# Patient Record
Sex: Female | Born: 1940 | Race: White | Hispanic: No | Marital: Married | Smoking: Never smoker
Health system: Southern US, Community
[De-identification: ages and names within clinical notes are randomized; demographics above are authoritative.]

## PROBLEM LIST (undated history)

## (undated) DIAGNOSIS — K219 Gastro-esophageal reflux disease without esophagitis: Secondary | ICD-10-CM

## (undated) DIAGNOSIS — E119 Type 2 diabetes mellitus without complications: Secondary | ICD-10-CM

## (undated) DIAGNOSIS — I10 Essential (primary) hypertension: Secondary | ICD-10-CM

## (undated) DIAGNOSIS — I519 Heart disease, unspecified: Secondary | ICD-10-CM

## (undated) DIAGNOSIS — I639 Cerebral infarction, unspecified: Secondary | ICD-10-CM

## (undated) DIAGNOSIS — I499 Cardiac arrhythmia, unspecified: Secondary | ICD-10-CM

## (undated) DIAGNOSIS — E7800 Pure hypercholesterolemia, unspecified: Secondary | ICD-10-CM

## (undated) DIAGNOSIS — I480 Paroxysmal atrial fibrillation: Secondary | ICD-10-CM

## (undated) HISTORY — PX: NECK SURGERY: SHX720

## (undated) HISTORY — PX: COLONOSCOPY: SHX174

## (undated) HISTORY — DX: Type 2 diabetes mellitus without complications: E11.9

## (undated) HISTORY — DX: Cerebral infarction, unspecified: I63.9

## (undated) HISTORY — DX: Essential (primary) hypertension: I10

## (undated) HISTORY — DX: Pure hypercholesterolemia, unspecified: E78.00

---

## 2000-06-19 ENCOUNTER — Ambulatory Visit (HOSPITAL_COMMUNITY): Admission: RE | Admit: 2000-06-19 | Discharge: 2000-06-20 | Payer: Self-pay | Admitting: Neurosurgery

## 2000-07-11 ENCOUNTER — Encounter: Admission: RE | Admit: 2000-07-11 | Discharge: 2000-07-11 | Payer: Self-pay | Admitting: Neurosurgery

## 2000-08-01 ENCOUNTER — Ambulatory Visit (HOSPITAL_COMMUNITY): Admission: RE | Admit: 2000-08-01 | Discharge: 2000-08-01 | Payer: Self-pay | Admitting: Cardiology

## 2001-01-12 ENCOUNTER — Encounter: Admission: RE | Admit: 2001-01-12 | Discharge: 2001-01-12 | Payer: Self-pay | Admitting: Neurosurgery

## 2002-01-01 ENCOUNTER — Other Ambulatory Visit: Admission: RE | Admit: 2002-01-01 | Discharge: 2002-01-01 | Payer: Self-pay | Admitting: Family Medicine

## 2002-01-02 ENCOUNTER — Ambulatory Visit (HOSPITAL_COMMUNITY): Admission: RE | Admit: 2002-01-02 | Discharge: 2002-01-02 | Payer: Self-pay | Admitting: Family Medicine

## 2002-01-02 ENCOUNTER — Encounter: Payer: Self-pay | Admitting: Family Medicine

## 2002-01-14 ENCOUNTER — Encounter: Payer: Self-pay | Admitting: Family Medicine

## 2002-01-14 ENCOUNTER — Ambulatory Visit (HOSPITAL_COMMUNITY): Admission: RE | Admit: 2002-01-14 | Discharge: 2002-01-14 | Payer: Self-pay | Admitting: Family Medicine

## 2002-02-01 ENCOUNTER — Ambulatory Visit (HOSPITAL_COMMUNITY): Admission: RE | Admit: 2002-02-01 | Discharge: 2002-02-01 | Payer: Self-pay | Admitting: Internal Medicine

## 2002-02-04 ENCOUNTER — Ambulatory Visit (HOSPITAL_COMMUNITY): Admission: RE | Admit: 2002-02-04 | Discharge: 2002-02-04 | Payer: Self-pay | Admitting: Family Medicine

## 2002-02-04 ENCOUNTER — Encounter: Payer: Self-pay | Admitting: Family Medicine

## 2003-09-21 ENCOUNTER — Inpatient Hospital Stay (HOSPITAL_COMMUNITY): Admission: EM | Admit: 2003-09-21 | Discharge: 2003-09-24 | Payer: Self-pay | Admitting: Emergency Medicine

## 2003-09-21 ENCOUNTER — Encounter: Payer: Self-pay | Admitting: Emergency Medicine

## 2004-01-15 ENCOUNTER — Ambulatory Visit (HOSPITAL_COMMUNITY): Admission: RE | Admit: 2004-01-15 | Discharge: 2004-01-15 | Payer: Self-pay | Admitting: Family Medicine

## 2004-02-25 ENCOUNTER — Ambulatory Visit (HOSPITAL_COMMUNITY): Admission: RE | Admit: 2004-02-25 | Discharge: 2004-02-25 | Payer: Self-pay | Admitting: Family Medicine

## 2004-03-02 ENCOUNTER — Ambulatory Visit (HOSPITAL_COMMUNITY): Admission: RE | Admit: 2004-03-02 | Discharge: 2004-03-02 | Payer: Self-pay | Admitting: Family Medicine

## 2008-07-16 ENCOUNTER — Ambulatory Visit (HOSPITAL_COMMUNITY): Admission: RE | Admit: 2008-07-16 | Discharge: 2008-07-16 | Payer: Self-pay | Admitting: Family Medicine

## 2008-08-18 ENCOUNTER — Ambulatory Visit (HOSPITAL_COMMUNITY): Admission: RE | Admit: 2008-08-18 | Discharge: 2008-08-18 | Payer: Self-pay | Admitting: Family Medicine

## 2008-09-23 ENCOUNTER — Ambulatory Visit (HOSPITAL_COMMUNITY): Admission: RE | Admit: 2008-09-23 | Discharge: 2008-09-23 | Payer: Self-pay | Admitting: Family Medicine

## 2009-12-10 ENCOUNTER — Inpatient Hospital Stay (HOSPITAL_COMMUNITY): Admission: EM | Admit: 2009-12-10 | Discharge: 2009-12-22 | Payer: Self-pay | Admitting: Emergency Medicine

## 2010-12-29 ENCOUNTER — Emergency Department (HOSPITAL_COMMUNITY): Admit: 2010-12-29 | Discharge: 2010-12-29 | Disposition: A | Discharge status: Home / Self Care | Payer: Medicare Other

## 2010-12-29 ENCOUNTER — Observation Stay (HOSPITAL_COMMUNITY)
Admission: EM | Admit: 2010-12-29 | Discharge: 2010-12-31 | Disposition: A | Discharge status: Home / Self Care | Payer: Medicare Other | Attending: Family Medicine | Admitting: Family Medicine

## 2010-12-29 ENCOUNTER — Encounter (HOSPITAL_COMMUNITY): Payer: Self-pay

## 2010-12-29 DIAGNOSIS — F411 Generalized anxiety disorder: Secondary | ICD-10-CM | POA: Insufficient documentation

## 2010-12-29 DIAGNOSIS — Z79899 Other long term (current) drug therapy: Secondary | ICD-10-CM | POA: Insufficient documentation

## 2010-12-29 DIAGNOSIS — I10 Essential (primary) hypertension: Secondary | ICD-10-CM | POA: Insufficient documentation

## 2010-12-29 DIAGNOSIS — R0789 Other chest pain: Secondary | ICD-10-CM | POA: Insufficient documentation

## 2010-12-29 DIAGNOSIS — IMO0001 Reserved for inherently not codable concepts without codable children: Secondary | ICD-10-CM | POA: Insufficient documentation

## 2010-12-29 DIAGNOSIS — I209 Angina pectoris, unspecified: Principal | ICD-10-CM | POA: Insufficient documentation

## 2010-12-29 DIAGNOSIS — I2510 Atherosclerotic heart disease of native coronary artery without angina pectoris: Secondary | ICD-10-CM | POA: Insufficient documentation

## 2010-12-29 LAB — COMPREHENSIVE METABOLIC PANEL
AST: 30 U/L (ref 0–37)
Albumin: 4 g/dL (ref 3.5–5.2)
Calcium: 9.7 mg/dL (ref 8.4–10.5)
Creatinine, Ser: 0.85 mg/dL (ref 0.4–1.2)
GFR calc Af Amer: >60 mL/min (ref >60)

## 2010-12-29 LAB — GLUCOSE, CAPILLARY: Glucose-Capillary: 164 mg/dL — ABNORMAL HIGH (ref 70–99)

## 2010-12-29 LAB — CBC
Hemoglobin: 13.9 g/dL (ref 12.0–15.0)
MCV: 85.7 fL (ref 78.0–100.0)
Platelets: 158 10*3/uL (ref 150–400)
RBC: 4.67 MIL/uL (ref 3.87–5.11)
WBC: 6.9 10*3/uL (ref 4.0–10.5)

## 2010-12-29 LAB — POCT CARDIAC MARKERS
CKMB, poc: <1 ng/mL — ABNORMAL LOW (ref 1.0–8.0)
Troponin i, poc: <0.05 ng/mL (ref 0.00–0.09)

## 2010-12-29 LAB — PROTIME-INR: Prothrombin Time: 12.6 seconds (ref 11.6–15.2)

## 2010-12-29 LAB — CK TOTAL AND CKMB (NOT AT ARMC): CK, MB: 1.2 ng/mL (ref 0.3–4.0)

## 2010-12-29 LAB — APTT: aPTT: 27 seconds (ref 24–37)

## 2010-12-29 LAB — TROPONIN I: Troponin I: 0.02 ng/mL (ref 0.00–0.06)

## 2010-12-30 LAB — GLUCOSE, CAPILLARY: Glucose-Capillary: 155 mg/dL — ABNORMAL HIGH (ref 70–99)

## 2010-12-30 LAB — CARDIAC PANEL(CRET KIN+CKTOT+MB+TROPI)
Relative Index: INVALID (ref 0.0–2.5)
Total CK: 41 U/L (ref 7–177)

## 2010-12-31 LAB — GLUCOSE, CAPILLARY: Glucose-Capillary: 194 mg/dL — ABNORMAL HIGH (ref 70–99)

## 2011-01-01 NOTE — H&P (Signed)
NAMEBRIYAH, Holly Sparks             ACCOUNT NO.:  [id number]  MEDICAL RECORD NO.:  [id number]          PATIENT TYPE:  OBV  LOCATION:  A312                          FACILITY:  [facility]  PHYSICIAN:  Angeletta Goelz L. Juanetta Gosling, M.D.DATE OF BIRTH:  [birth year]  DATE OF ADMISSION:  12/29/2010 DATE OF DISCHARGE:  LH                             HISTORY & PHYSICAL   Patient of Dr. [name].  REASON FOR ADMISSION:  Chest pain.  HISTORY:  Holly Sparks is a 70 year old who came to the emergency room because of chest discomfort.  She said that it started about 2 hours prior to coming to the emergency room.  It felt like what she describes as a toothache in her chest.  It was in the midsternal area.  She has had some nausea, but no diaphoresis and the pain did not radiate.  She was getting dressed, getting ready for church when it started.  She said that she has been dizzy during the day.  Her pain was as high as a 5 or so out of 10.  It has gone now.  She took aspirin and when she came to the emergency room, she was given nitroglycerin.  It is not totally clear if the nitroglycerin helped her or not.  Her past medical history is positive for coronary artery disease with cardiac catheterization done in 2011 and that showed she had a 50-60% narrowing in the LAD in the diagonal, circumflex had a 20% proximal narrowing before the first marginal branch.  There was a 30% narrowing in the right coronary.  She had normal left ventricular function perhaps even hyperdynamic and she was had an episode of atrial fibrillation and was noted to have some episodes of bradycardia during her hospitalization in January 2011.  In addition to that, she has diabetes mellitus, hypertension and hyperlipidemia.  Her family history is positive for coronary artery disease in her father.  Her medications at home are: 1. Aspirin 81 mg daily. 2. Lipitor 10 mg daily. 3. Metoprolol 12.5 mg b.i.d. 4. Enalapril 10 mg daily. 5.  Vitamin D 1000 units daily. 6. She takes hydrochlorothiazide as needed for fluid.  Her social history, she does not use any tobacco, alcohol or illicit drugs and lives at home with family.  Her review of systems except as mentioned is essentially negative.  Her exam now blood pressure initially 194/99, then 176/89, pulse 70, then 60, temperature is 98.2.  Her respirations about 18.  Her pupils are reactive.  Nose and throat are clear.  Her neck is supple without masses, bruits or JVD.  Her chest is clear without wheezes, rales or rhonchi.  Her heart is regular.  She does not have a murmur, gallop or rub.  Her abdomen is soft without masses.  Bowel sounds are present and active.  Extremity showed no edema.  Central nervous system examination is grossly intact.  Electrocardiogram shows nonspecific ST-T wave changes and there was T- wave inversion in chest lead I that now has resolved and she has cardiac panel in the emergency room that was negative.  Her electrolytes were normal.  Glucose of 215.  Liver panel  is normal.  PT/INR normal.  White blood count 6900, hemoglobin is 13.9, platelets 158.  ASSESSMENT:  She has chest discomfort.  She does have fairly minor coronary artery disease based on cardiac catheterization.  She has a history of atrial fibrillation, but she did not have any arrhythmias that she noticed at least prior to coming to the hospital and she has multiple cardiac risk factors.  She is going to be brought in for observation, have a monitor, clear liquid diet and she will have cardiac panel and EKG q.8 h x2 more and she will have consultation with her cardiologist in the morning.     Holly Sparks L. Juanetta Gosling, M.D.     ELH/MEDQ  D:  12/29/2010  T:  12/30/2010  Job:  [id number]  Electronically Signed by Kari Baars M.D. on 12/31/2010 08:21:50 AM

## 2011-01-11 NOTE — Discharge Summary (Signed)
  Holly Sparks, Holly Sparks             ACCOUNT NO.:  [id number]  MEDICAL RECORD NO.:  [id number]           PATIENT TYPE:  I  LOCATION:  A312                          FACILITY:  [facility]  PHYSICIAN:  Mila Homer. [name], M.D.DATE OF BIRTH:  [birth year]  DATE OF ADMISSION:  12/29/2010 DATE OF DISCHARGE:  02/03/2012LH                              DISCHARGE SUMMARY   This 70 year old woman was admitted to the hospital with angina pectoris secondary to a hypertensive crisis.  She had a benign 3-day hospitalization extending from December 29, 2010, to December 31, 2010. Vital signs remained stable, although blood pressures were running high the first several days.  Chest x-ray showed stable cardiomegaly.  Blood tests included a CBC and a CMP, which were normal except for the glucose of 215.  Her INR of 0.92.  Cardiac markers x2 were normal, and cardiac panel x2 was normal.  Troponin was 0.02.  She was admitted to the hospital by Dr. Shaune Pollack, on-call for me.  She was started on normal saline IV at 100 mL an hour, given clear liquids, aspirin 325 mg daily, Tylenol p.r.n., Lipitor 80 mg daily, metoprolol 12.5 mg q.12 hours, enalapril 10 mg daily, and she had Accu-Cheks before meals and at bedtime with sliding scale insulin.  EKG stayed stable.  She did require clonidine 0.1 mg q.4 hours, given her hypertension and this was given p.r.n.  She was put on lorazepam 0.5 q.i.d. p.r.n. anxiety.  She was seen by Dr. Ritta Slot of [facility], who felt that her angina pectoris had been kicked off by hypertension.  By her third day she was improved.  Blood pressures had dropped somewhat on enalapril 20 mg b.i.d. and hydrochlorothiazide had been started at 25 daily.  She was ready for discharge home and was much improved at that time.  FINAL DISCHARGE DIAGNOSES: 1. 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 2. Hypertensive crisis. 3. Type 2 diabetes, uncontrolled. 4.  Coronary artery disease. 5. Anxiety.  She was discharged home on: 1. Enalapril 20 mg b.i.d. (#60, RF x11). 2. Hydrochlorothiazide 25 mg daily. 3. Metoprolol 50 mg quarter tablet b.i.d. 4. She is also to take lorazepam 0.5 mg q.i.d. for anxiety. 5. She is to continue aspirin 81 mg daily. 6. Lipitor 80 mg, which she usually uses has an eighth of a tablet     daily. 7. Metformin 500 mg 1 in the morning, 2 at night. 8. Vicodin 5/500 for severe pain.  FOLLOWUP:  Will be in my office within 5 days.  She is to continue to check her blood pressure on her home digital cuff b.i.d. and to call if the blood pressures are significantly elevated.  We discussed this at length.     Mila Homer. [name], M.D.     SDK/MEDQ  D:  12/31/2010  T:  12/31/2010  Job:  [id number]  Electronically Signed by John Giovanni M.D. on 01/06/2011 03:38:32 AM

## 2011-01-11 NOTE — Progress Notes (Addendum)
  Holly Sparks, DEGROOTE             ACCOUNT NO.:  [id number]  MEDICAL RECORD NO.:  [id number]           PATIENT TYPE:  LOCATION:  RAD                           FACILITY:  [facility]  PHYSICIAN:  Mila Homer. [name], M.D.DATE OF BIRTH:  January 17, 1941  DATE OF PROCEDURE:  12/30/2010 DATE OF DISCHARGE:                                PROGRESS NOTE   SUBJECTIVE:  She feels somewhat better today.  Yesterday she had a lot of stressors and then developed a dull, deep chest ache.  Her blood pressure was also up.  She came to the emergency room.  She does have a history of coronary artery disease with cardiac catheterization to [facility] done 1 year ago.  She has a 50-60% lesion and other lesions as well.  Today she is asymptomatic.  I talked to her nurse, who notes her blood pressure has been up, however.  She is on metoprolol and enalapril for that.  OBJECTIVE:  VITAL SIGNS:  Temperature 97.8, pulse 49, respiratory rate 18, blood pressure 187/80. GENERAL:  She is sitting up in bed.  Her friend is visiting.  She is in no acute distress.  Well-developed and somewhat obese.  She is oriented and alert. HEART:  Has a regular rhythm, rate of about 70. LUNGS:  Are clear throughout. EXTREMITIES:  She has no edema of the ankles.  Cardiac enzymes x2 done overnight were negative.  ASSESSMENT: 1. Angina pectoris. 2. Coronary artery disease. 3. Hypertension. 4. Anxiety. 5. Type 2 diabetes.  PLAN:  DC IV and switch to Hep-Lock.  Lorazepam 0.5 mg q.i.d. p.r.n. anxiety.  Add clonidine 0.1 mg q.4 h for systolic BP greater than 160 or diastolics greater than 110.  Talked to her at length.  I think the social stressors she had recently probably resulted in her blood pressure elevation which then put too much stress on her heart and caused the angina.  She will be seen by [facility] today for further evaluation.     Mila Homer. [name], M.D.     SDK/MEDQ   D:  12/30/2010  T:  12/30/2010  Job:  [id number]  Electronically Signed by John Giovanni M.D. on 01/06/2011 03:39:02 AM

## 2011-01-12 NOTE — Consult Note (Signed)
Holly Sparks, IVIE NO.:  [id number]  MEDICAL RECORD NO.:  [id number]           PATIENT TYPE:  LOCATION:                                 FACILITY:  PHYSICIAN:  Ritta Slot, MD     DATE OF BIRTH:  [birth year]  DATE OF CONSULTATION:  12/30/2010 DATE OF DISCHARGE:                                CONSULTATION   REFERRING PHYSICIAN:  Mila Homer. [name], M.D.  REASON FOR CONSULTATION:  Chest pain.  [name] is a very pleasant 71 year old Caucasian female who came to emergency room with atypical chest pain.  She had a pain in her chest that started about 6 o'clock in the evening, it felt like a a heaviness in her chest.  It lasted 2 hours until she came to the emergency room when her chest pain was beginning of dissipate.  She was given nitroglycerin and her chest pain resolved.  On admission to the emergency room, she was found to have a very high blood pressure of 194/99.  ECG showed nonspecific ST-T wave changes, T-wave inversion and chest pain resolved.  Her cardiac enzyme panel was entirely normal.  She remains  pain free and has been better.  Her chest pain has resolved; however, her blood pressure remains elevated today at 205/94.  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  PAST MEDICAL HISTORY: 1. Positive for paroxysmal atrial fibrillation in the past, cardiac     catheterization done in 2011 showed 50% to 60% narrowing in the LAD     and diagonal,mild disease in the circumflex first marginal, 30% narrowing     in the right coronary artery, normal LV function. 2. Diabetes. 3. Hypertension. 4. Dyslipidemia.  CURRENT MEDICATIONS: 1. On admission to the hospital, aspirin 81 mg daily. 2. Lipitor 10 mg daily. 3. Enalapril 10 mg daily. 4. Norvasc 5 mg b.i.d. 5. Vitamin D 1000 units daily.  SOCIAL HISTORY:  Does not use any tobacco or  alcohol or illicit drug use.  ALLERGIES:  No known drug allergies.  REVIEW OF SYSTEMS:  Unremarkable.  PHYSICAL EXAMINATION:  GENERAL:  Well-looking individual, in no acute distress. VITAL SIGNS:  Temperature is 97.9, pulse was 60 SR, respirations 18, blood pressure 200/94, O2 sat 94% on room air. HEENT:  Head is atraumatic, normocephalic. NECK:  Supple.  Full range with motion.  No jugular venous distention, carotid bruits, or thyromegaly. NEUROLOGIC:  Cranial nerves II-XII normal.  PERLA no focal deficits. MUSCULOSKELETAL:  Normal, no focal deficits. CARDIOVASCULAR:   Heart sounds 1 and 2 heard.  No murmurs, rubs, or gallops. LUNGS:  Clear to auscultation bilaterally.  Percussion resonant throughout.  No creps or wheezing noted. EXTREMITIES:  Pedal pulses 2+.  No edema. ABDOMEN:  Soft, nontender.  No hepatosplenomegaly.  Bowel sounds present.  AAA noted.  No renal bruits heard.  PLAN:  I think we will go back up on her enalapril 20 mg p.o. b.i.d. and add in hydrochlorothiazide 25 mg daily.  She will need  to get her blood pressure down to less than 130/70.  We will follow along with you.  I think she can be discharged when her blood pressure is better.  My assessment for this lady is that she has chest pain secondary to hypertensive crisis.  Nonetheless, we will go ahead and order an outpatient nuclear perfusion stress test in our office after her discharge.  We will arrange this.  We will follow with you.  Many thanks for consultation.     Ritta Slot, MD     HS/MEDQ  D:  12/30/2010  T:  12/31/2010  Job:  [id number]  Electronically Signed by Ritta Slot MD on 01/12/2011 01:47:18 PM

## 2011-02-12 LAB — COMPREHENSIVE METABOLIC PANEL
ALT: 32 U/L (ref 0–35)
AST: 40 U/L — ABNORMAL HIGH (ref 0–37)
Albumin: 3.5 g/dL (ref 3.5–5.2)
Alkaline Phosphatase: 58 U/L (ref 39–117)
Calcium: 9.6 mg/dL (ref 8.4–10.5)
GFR calc Af Amer: >60 mL/min (ref >60)
Glucose, Bld: 182 mg/dL — ABNORMAL HIGH (ref 70–99)
Potassium: 4 mEq/L (ref 3.5–5.1)
Sodium: 138 mEq/L (ref 135–145)
Total Protein: 6.5 g/dL (ref 6.0–8.3)

## 2011-02-12 LAB — GLUCOSE, CAPILLARY
Glucose-Capillary: 150 mg/dL — ABNORMAL HIGH (ref 70–99)
Glucose-Capillary: 159 mg/dL — ABNORMAL HIGH (ref 70–99)
Glucose-Capillary: 201 mg/dL — ABNORMAL HIGH (ref 70–99)
Glucose-Capillary: 209 mg/dL — ABNORMAL HIGH (ref 70–99)
Glucose-Capillary: 223 mg/dL — ABNORMAL HIGH (ref 70–99)
Glucose-Capillary: 258 mg/dL — ABNORMAL HIGH (ref 70–99)
Glucose-Capillary: 269 mg/dL — ABNORMAL HIGH (ref 70–99)
Glucose-Capillary: 305 mg/dL — ABNORMAL HIGH (ref 70–99)

## 2011-02-12 LAB — LIPID PANEL
Cholesterol: 145 mg/dL (ref 0–200)
HDL: 43 mg/dL (ref >39)
Total CHOL/HDL Ratio: 3.4 RATIO
Triglycerides: 126 mg/dL (ref <150)
VLDL: 25 mg/dL (ref 0–40)
VLDL: 27 mg/dL (ref 0–40)

## 2011-02-12 LAB — HEPARIN LEVEL (UNFRACTIONATED)
Heparin Unfractionated: 0.58 IU/mL (ref 0.30–0.70)
Heparin Unfractionated: 1.21 IU/mL — ABNORMAL HIGH (ref 0.30–0.70)

## 2011-02-12 LAB — BASIC METABOLIC PANEL
BUN: 24 mg/dL — ABNORMAL HIGH (ref 6–23)
CO2: 23 mEq/L (ref 19–32)
Calcium: 9.3 mg/dL (ref 8.4–10.5)
Chloride: 102 mEq/L (ref 96–112)
Creatinine, Ser: 0.79 mg/dL (ref 0.4–1.2)
Creatinine, Ser: 0.87 mg/dL (ref 0.4–1.2)
GFR calc Af Amer: >60 mL/min (ref >60)
GFR calc non Af Amer: >60 mL/min (ref >60)
Glucose, Bld: 182 mg/dL — ABNORMAL HIGH (ref 70–99)
Sodium: 136 mEq/L (ref 135–145)

## 2011-02-12 LAB — CBC
HCT: 39.6 % (ref 36.0–46.0)
Hemoglobin: 12.9 g/dL (ref 12.0–15.0)
Hemoglobin: 14.9 g/dL (ref 12.0–15.0)
MCHC: 33.9 g/dL (ref 30.0–36.0)
MCV: 88.7 fL (ref 78.0–100.0)
Platelets: 129 10*3/uL — ABNORMAL LOW (ref 150–400)
Platelets: 156 10*3/uL (ref 150–400)
RBC: 4.91 MIL/uL (ref 3.87–5.11)
RDW: 14.7 % (ref 11.5–15.5)
RDW: 14.8 % (ref 11.5–15.5)
RDW: 14.9 % (ref 11.5–15.5)
WBC: 8.4 10*3/uL (ref 4.0–10.5)

## 2011-02-12 LAB — CK TOTAL AND CKMB (NOT AT ARMC)
CK, MB: 4.5 ng/mL — ABNORMAL HIGH (ref 0.3–4.0)
Relative Index: INVALID (ref 0.0–2.5)
Total CK: 42 U/L (ref 7–177)
Total CK: 47 U/L (ref 7–177)

## 2011-02-12 LAB — APTT: aPTT: >200 seconds (ref 24–37)

## 2011-02-12 LAB — POCT I-STAT, CHEM 8
BUN: 45 mg/dL — ABNORMAL HIGH (ref 6–23)
Calcium, Ion: 1.05 mmol/L — ABNORMAL LOW (ref 1.12–1.32)
Chloride: 105 mEq/L (ref 96–112)
HCT: 51 % — ABNORMAL HIGH (ref 36.0–46.0)
Potassium: 5.7 mEq/L — ABNORMAL HIGH (ref 3.5–5.1)

## 2011-02-12 LAB — URINALYSIS, MICROSCOPIC ONLY
Glucose, UA: NEGATIVE mg/dL
Ketones, ur: NEGATIVE mg/dL
Nitrite: NEGATIVE
Specific Gravity, Urine: 1.021 (ref 1.005–1.030)
pH: 5 (ref 5.0–8.0)

## 2011-02-12 LAB — POCT CARDIAC MARKERS
CKMB, poc: 1.2 ng/mL (ref 1.0–8.0)
Troponin i, poc: <0.05 ng/mL (ref 0.00–0.09)

## 2011-02-12 LAB — PROTIME-INR
INR: 1.09 (ref 0.00–1.49)
INR: 1.09 (ref 0.00–1.49)
INR: 1.12 (ref 0.00–1.49)
Prothrombin Time: 14 seconds (ref 11.6–15.2)

## 2011-02-12 LAB — DIFFERENTIAL
Basophils Absolute: 0 10*3/uL (ref 0.0–0.1)
Basophils Relative: 0 % (ref 0–1)
Lymphocytes Relative: 18 % (ref 12–46)
Monocytes Absolute: 0.3 10*3/uL (ref 0.1–1.0)
Monocytes Relative: 4 % (ref 3–12)
Neutro Abs: 6.1 10*3/uL (ref 1.7–7.7)
Neutrophils Relative %: 77 % (ref 43–77)

## 2011-02-12 LAB — HEMOGLOBIN A1C: Hgb A1c MFr Bld: 7.6 % — ABNORMAL HIGH (ref 4.6–6.1)

## 2011-02-13 LAB — DIFFERENTIAL
Basophils Absolute: 0 10*3/uL (ref 0.0–0.1)
Basophils Absolute: 0 10*3/uL (ref 0.0–0.1)
Basophils Relative: 0 % (ref 0–1)
Basophils Relative: 1 % (ref 0–1)
Eosinophils Absolute: 0.1 10*3/uL (ref 0.0–0.7)
Eosinophils Relative: 1 % (ref 0–5)
Lymphocytes Relative: 36 % (ref 12–46)
Monocytes Absolute: 0.4 10*3/uL (ref 0.1–1.0)
Neutro Abs: 3.7 10*3/uL (ref 1.7–7.7)
Neutrophils Relative %: 55 % (ref 43–77)

## 2011-02-13 LAB — GLUCOSE, CAPILLARY
Glucose-Capillary: 122 mg/dL — ABNORMAL HIGH (ref 70–99)
Glucose-Capillary: 123 mg/dL — ABNORMAL HIGH (ref 70–99)
Glucose-Capillary: 139 mg/dL — ABNORMAL HIGH (ref 70–99)
Glucose-Capillary: 143 mg/dL — ABNORMAL HIGH (ref 70–99)
Glucose-Capillary: 144 mg/dL — ABNORMAL HIGH (ref 70–99)
Glucose-Capillary: 147 mg/dL — ABNORMAL HIGH (ref 70–99)
Glucose-Capillary: 158 mg/dL — ABNORMAL HIGH (ref 70–99)
Glucose-Capillary: 164 mg/dL — ABNORMAL HIGH (ref 70–99)
Glucose-Capillary: 176 mg/dL — ABNORMAL HIGH (ref 70–99)
Glucose-Capillary: 176 mg/dL — ABNORMAL HIGH (ref 70–99)
Glucose-Capillary: 178 mg/dL — ABNORMAL HIGH (ref 70–99)
Glucose-Capillary: 185 mg/dL — ABNORMAL HIGH (ref 70–99)
Glucose-Capillary: 186 mg/dL — ABNORMAL HIGH (ref 70–99)
Glucose-Capillary: 192 mg/dL — ABNORMAL HIGH (ref 70–99)
Glucose-Capillary: 216 mg/dL — ABNORMAL HIGH (ref 70–99)
Glucose-Capillary: 226 mg/dL — ABNORMAL HIGH (ref 70–99)
Glucose-Capillary: 250 mg/dL — ABNORMAL HIGH (ref 70–99)

## 2011-02-13 LAB — HEPARIN LEVEL (UNFRACTIONATED)
Heparin Unfractionated: 0.5 IU/mL (ref 0.30–0.70)
Heparin Unfractionated: 0.57 IU/mL (ref 0.30–0.70)
Heparin Unfractionated: 0.66 IU/mL (ref 0.30–0.70)
Heparin Unfractionated: 0.74 IU/mL — ABNORMAL HIGH (ref 0.30–0.70)
Heparin Unfractionated: <0.1 IU/mL — ABNORMAL LOW (ref 0.30–0.70)

## 2011-02-13 LAB — PROTIME-INR
INR: 1.06 (ref 0.00–1.49)
INR: 1.1 (ref 0.00–1.49)
INR: 1.16 (ref 0.00–1.49)
INR: 1.63 — ABNORMAL HIGH (ref 0.00–1.49)
INR: 2.23 — ABNORMAL HIGH (ref 0.00–1.49)
Prothrombin Time: 13.5 seconds (ref 11.6–15.2)
Prothrombin Time: 13.7 seconds (ref 11.6–15.2)
Prothrombin Time: 14.1 seconds (ref 11.6–15.2)
Prothrombin Time: 14.2 seconds (ref 11.6–15.2)
Prothrombin Time: 16.9 seconds — ABNORMAL HIGH (ref 11.6–15.2)
Prothrombin Time: 17.7 seconds — ABNORMAL HIGH (ref 11.6–15.2)
Prothrombin Time: 19.2 seconds — ABNORMAL HIGH (ref 11.6–15.2)
Prothrombin Time: 24.5 seconds — ABNORMAL HIGH (ref 11.6–15.2)

## 2011-02-13 LAB — BASIC METABOLIC PANEL
BUN: 12 mg/dL (ref 6–23)
BUN: 12 mg/dL (ref 6–23)
BUN: 12 mg/dL (ref 6–23)
BUN: 14 mg/dL (ref 6–23)
BUN: 15 mg/dL (ref 6–23)
CO2: 22 mEq/L (ref 19–32)
CO2: 25 mEq/L (ref 19–32)
CO2: 25 mEq/L (ref 19–32)
CO2: 26 mEq/L (ref 19–32)
CO2: 26 mEq/L (ref 19–32)
CO2: 26 mEq/L (ref 19–32)
CO2: 28 mEq/L (ref 19–32)
Calcium: 8.8 mg/dL (ref 8.4–10.5)
Calcium: 8.8 mg/dL (ref 8.4–10.5)
Calcium: 9 mg/dL (ref 8.4–10.5)
Calcium: 9 mg/dL (ref 8.4–10.5)
Calcium: 9.1 mg/dL (ref 8.4–10.5)
Calcium: 9.4 mg/dL (ref 8.4–10.5)
Calcium: 9.5 mg/dL (ref 8.4–10.5)
Chloride: 105 mEq/L (ref 96–112)
Chloride: 107 mEq/L (ref 96–112)
Chloride: 107 mEq/L (ref 96–112)
Chloride: 108 mEq/L (ref 96–112)
Creatinine, Ser: 0.77 mg/dL (ref 0.4–1.2)
Creatinine, Ser: 0.8 mg/dL (ref 0.4–1.2)
Creatinine, Ser: 0.82 mg/dL (ref 0.4–1.2)
Creatinine, Ser: 0.84 mg/dL (ref 0.4–1.2)
Creatinine, Ser: 0.96 mg/dL (ref 0.4–1.2)
Creatinine, Ser: 0.98 mg/dL (ref 0.4–1.2)
Creatinine, Ser: 1 mg/dL (ref 0.4–1.2)
Creatinine, Ser: 1.07 mg/dL (ref 0.4–1.2)
GFR calc Af Amer: >60 mL/min (ref >60)
GFR calc Af Amer: >60 mL/min (ref >60)
GFR calc Af Amer: >60 mL/min (ref >60)
GFR calc Af Amer: >60 mL/min (ref >60)
GFR calc Af Amer: >60 mL/min (ref >60)
GFR calc Af Amer: >60 mL/min (ref >60)
GFR calc non Af Amer: 55 mL/min — ABNORMAL LOW (ref >60)
GFR calc non Af Amer: 57 mL/min — ABNORMAL LOW (ref >60)
GFR calc non Af Amer: >60 mL/min (ref >60)
GFR calc non Af Amer: >60 mL/min (ref >60)
GFR calc non Af Amer: >60 mL/min (ref >60)
Glucose, Bld: 116 mg/dL — ABNORMAL HIGH (ref 70–99)
Glucose, Bld: 118 mg/dL — ABNORMAL HIGH (ref 70–99)
Glucose, Bld: 145 mg/dL — ABNORMAL HIGH (ref 70–99)
Glucose, Bld: 151 mg/dL — ABNORMAL HIGH (ref 70–99)
Glucose, Bld: 167 mg/dL — ABNORMAL HIGH (ref 70–99)
Glucose, Bld: 180 mg/dL — ABNORMAL HIGH (ref 70–99)
Glucose, Bld: 182 mg/dL — ABNORMAL HIGH (ref 70–99)
Potassium: 3.6 mEq/L (ref 3.5–5.1)
Potassium: 3.7 mEq/L (ref 3.5–5.1)
Potassium: 3.7 mEq/L (ref 3.5–5.1)
Potassium: 4.2 mEq/L (ref 3.5–5.1)
Sodium: 133 mEq/L — ABNORMAL LOW (ref 135–145)
Sodium: 135 mEq/L (ref 135–145)
Sodium: 137 mEq/L (ref 135–145)
Sodium: 138 mEq/L (ref 135–145)

## 2011-02-13 LAB — CBC
HCT: 35.7 % — ABNORMAL LOW (ref 36.0–46.0)
HCT: 35.7 % — ABNORMAL LOW (ref 36.0–46.0)
HCT: 36.5 % (ref 36.0–46.0)
HCT: 37 % (ref 36.0–46.0)
HCT: 37.4 % (ref 36.0–46.0)
Hemoglobin: 12.2 g/dL (ref 12.0–15.0)
Hemoglobin: 13.3 g/dL (ref 12.0–15.0)
MCHC: 33 g/dL (ref 30.0–36.0)
MCHC: 33.5 g/dL (ref 30.0–36.0)
MCHC: 33.9 g/dL (ref 30.0–36.0)
MCHC: 34.4 g/dL (ref 30.0–36.0)
MCHC: 34.5 g/dL (ref 30.0–36.0)
MCHC: 34.6 g/dL (ref 30.0–36.0)
MCV: 89.2 fL (ref 78.0–100.0)
MCV: 89.4 fL (ref 78.0–100.0)
MCV: 90.1 fL (ref 78.0–100.0)
MCV: 90.3 fL (ref 78.0–100.0)
Platelets: 117 10*3/uL — ABNORMAL LOW (ref 150–400)
Platelets: 126 10*3/uL — ABNORMAL LOW (ref 150–400)
Platelets: 126 10*3/uL — ABNORMAL LOW (ref 150–400)
Platelets: 130 10*3/uL — ABNORMAL LOW (ref 150–400)
Platelets: 130 10*3/uL — ABNORMAL LOW (ref 150–400)
RBC: 3.89 MIL/uL (ref 3.87–5.11)
RBC: 3.96 MIL/uL (ref 3.87–5.11)
RBC: 3.98 MIL/uL (ref 3.87–5.11)
RBC: 4.03 MIL/uL (ref 3.87–5.11)
RBC: 4.37 MIL/uL (ref 3.87–5.11)
RDW: 14.4 % (ref 11.5–15.5)
RDW: 14.6 % (ref 11.5–15.5)
RDW: 14.6 % (ref 11.5–15.5)
RDW: 14.7 % (ref 11.5–15.5)
RDW: 14.8 % (ref 11.5–15.5)
RDW: 15.1 % (ref 11.5–15.5)
RDW: 15.1 % (ref 11.5–15.5)
WBC: 5.1 10*3/uL (ref 4.0–10.5)
WBC: 5.9 10*3/uL (ref 4.0–10.5)
WBC: 8.2 10*3/uL (ref 4.0–10.5)

## 2011-02-13 LAB — APTT: aPTT: 28 seconds (ref 24–37)

## 2011-02-13 LAB — POCT I-STAT GLUCOSE
Glucose, Bld: 150 mg/dL — ABNORMAL HIGH (ref 70–99)
Operator id: 173791

## 2011-02-13 LAB — MAGNESIUM: Magnesium: 1.8 mg/dL (ref 1.5–2.5)

## 2011-04-15 NOTE — Discharge Summary (Signed)
[place]. [facility]  Patient:    Holly Sparks, Holly Sparks                    MRN: [id number] Adm. Date:  [id number] Disc. Date: [id number] Attending:  Tressie Stalker D                           Discharge Summary  For full details of this admission, please refer to typed history and physical.  BRIEF HISTORY:  The patient is a 70 year old white female who suffers from neck and right arm pain.  She failed medical management and was worked up as an outpatient with a cervical MRI demonstrating a herniated disk at C6-C7. Her signs and symptoms at physical examination were positional with right C-7 radiculopathy.  She therefore weighed the risks, benefits, and alternatives to surgery and decided to proceed with surgery.  For past medical history, past surgical history, medications prior to admission, drug allergies, family and medical history, surgical history, and admission physical examination, imaging, ______ etc., please refer to typed history and physical.  HOSPITAL COURSE:  I performed a C6-C7-C8 cervical diskectomy with fusion and plating on the patient on June 19, 2000 without complications (for full details of this operation, please refer to typed operative note).  POSTOPERATIVE COURSE:  The patients postoperative course was unremarkable. She remained afebrile.  Her vital signs were stable.  By postoperative day #1, she was eating well, ambulating well.  Her wound was healing well without signs of infection.  Her motor strength was normal, and she was requesting discharge to home.  She was therefore discharged to home on June 20, 2000.  DISCHARGE INSTRUCTIONS:  The patient was given written discharge instructions and instructed to follow-up with me in three weeks, to wear a soft collar.  DISCHARGE MEDICATIONS: 1. Tylox #60 1-2 p.o. q.4h. p.r.n. pain.  No refills. 2. Valium 5 mg #40 1 p.o. q.6h. p.r.n. for muscle spasms.  One refill.  She was instructed  to resume her outpatient medical regimen of Prinivil and Evista.  FINAL DIAGNOSIS:  C6-C7 degenerative disease, herniated nucleus pulposus, spondylosis, spinal stenosis.  Procedure performed was a C6-C7 anterior cervical diskectomy, interbody iliac crest allograft arthrodesis, anterior cervical plating ([place]) C6-C7.DD:  06/20/00 TD:  06/22/00 Job: [id number]

## 2011-04-15 NOTE — Group Therapy Note (Signed)
   NAME:  Holly Sparks, Holly Sparks                       ACCOUNT NO.:  [id number]   MEDICAL RECORD NO.:  [id number]                   PATIENT TYPE:  INP   LOCATION:  IC10                                 FACILITY:  [facility]   PHYSICIAN:  Mila Homer. [name], M.D.           DATE OF BIRTH:  Apr 12, 1941   DATE OF PROCEDURE:  09/22/2003  DATE OF DISCHARGE:                                   PROGRESS NOTE   SUBJECTIVE:  Yesterday afternoon she was sitting watching a movie on  television when she had sudden palpitations, chest discomfort.  She was  brought to the ER, found to be in atrial fibrillation, rapid ventricular  response, put on Cardizem and heparin drip.  She converted today and is  asymptomatic presently.   OBJECTIVE:  Today temperature is 97.9, pulse 68, respiratory rate 15, blood  pressure 114/70.  She is oriented, alert, no acute distress, well developed,  well nourished.  Lungs clear throughout.  Heart regular rhythm, heart rate  of 70.  Abdomen soft without hepatosplenomegaly, no mass, no abdominal  tenderness.  No edema of the ankles.   Accu-Cheks today have been 144, 123, 156, 163.   ASSESSMENT:  1. Atrial fibrillation, now converted to normal sinus rhythm.  2. Type 2 diabetes.   PLAN:  At present continue with diltiazem, heparin drip.  Patient to be seen  by [facility] in the morning since she has now had two bouts of  atrial fibrillation, last 4-5 years ago.      ___________________________________________                                            Mila Homer. [name], M.D.   SDK/MEDQ  D:  09/22/2003  T:  09/23/2003  Job:  [id number]

## 2011-04-15 NOTE — H&P (Signed)
[place]. [facility]  Patient:    Holly Sparks, Holly Sparks                    MRN: [id number] Adm. Date:  [id number] Attending:  Tressie Stalker D                         History and Physical  CHIEF COMPLAINT:  Right arm pain.  HISTORY OF PRESENT ILLNESS:  The patient is a 70 year old white female who complains of approximately an eight-week history of right arm pain.  She was worked up with a cervical MRI that demonstrated a cervical herniated disk.  The patient failed medical management and therefore weighed the risks, benefits, and alternatives to surgery, and decided to proceed with a C6-7 anterior cervical diskectomy with fusion and plating.  PAST MEDICAL HISTORY: 1. Positive for migraine headaches. 2. Type 2 diabetes mellitus, diagnosed approximately two months ago.  CURRENT MEDICATIONS PRIOR TO ADMISSION:  Hydrocodone p.r.n.  ALLERGIES:  No known drug allergies.  PAST SURGICAL HISTORY:  None.  FAMILY HISTORY:  The patients mother is age 85 and suffers from Alzheimers disease.  The patients father died in his late 88s, secondary to congestive heart failure.  He also had diabetes mellitus.  SOCIAL HISTORY:  The patient is married.  She has three children.  She is retired from the [employer].  She lives in [place].  She denies tobacco, ethanol, or drug use.  REVIEW OF SYSTEMS:  Negative except as above.  PHYSICAL EXAMINATION:  GENERAL:  A pleasant well-developed, well-nourished, moderately-obese 70 year old white female complaining of right arm pain.  Height 5 feet 6 inches, weight 190  pounds.  HEENT:  Normocephalic, atraumatic.  Pupils equal, round, reactive to light. Extraocular muscles intact.  Sclerae white.  Conjunctivae pink.  Oropharynx benign. Uvula midline.  NECK:  Supple.  There are no masses, meningismus, deformities, tracheal deviation, jugular venous distention, or carotid bruits.  She has a positive  Spurlings test on the right and negative on the left.  Lhermittes sign was not present.  THORAX:  Symmetric.  LUNGS:  Clear to auscultation.  HEART:  A regular rate and rhythm.  ABDOMEN:  Soft, nontender, obese.  EXTREMITIES:  No obvious deformities.  BACK:  Fine, no tenderness or deformities.  Straight leg raising and Faberes testing negative bilaterally.  NEUROLOGIC:  The patient is alert and oriented x 3.  Cranial nerves II-XII grossly intact bilaterally.  Vision and hearing are grossly normal bilaterally.  Motor strength is 5/5 in the bilateral deltoid, biceps, hand grip, wrist extensor, psoas, quadriceps, gastrocnemius, extensor hallucis longus, and left triceps.  Her right triceps strength is diminished at 4+/5.  Cerebellar examination intact to rapid  alternating movements in the upper extremities bilaterally.  Deep tendon reflexes 2/4 in the bilateral biceps, brachialis radialis, quadriceps, gastrocnemius, and left tricepss, 1/4 in the right tricep.  Sensory examination demonstrates normal sensation.  IMAGING STUDIES:  The patient had a cervical MRI performed at [facility] on une 21, 2001.  She has significant spondylosis at C3-4 and C4-5 on the left.  At C6-7 she has a large right herniated nucleus pulposus compressing the right C7 nerve  root.  C7-T1 is normal.  ASSESSMENT/PLAN: 1. Right C6-7 degenerative disease with herniated nucleus pulposus,    spondylosis, spinal stenosis, cervical radiculopathy.  Have discussed the situation with the patient.  Have reviewed her MRI scan with her and pointed out the abnormalities,  and that her signs and symptoms and the physical examination are consistent with a right C7 radiculopathy.  I have discussed the  various treatment options with her, including doing nothing, continuing on medical management, and surgery.  I described the procedure of a C6-7 anterior cervical  diskectomy, fusion, and plating.  I have  shown her surgical models and discussed the risks of surgery extensively.  The patient does have significant spondylosis at other levels, but is on the contralateral side, and obviously not the cause of he problem.  The patient has weighed the risks, benefits, and alternatives to surgery and has decided to proceed with a C6-7 anterior cervical diskectomy with fusion and plating on June 19, 2000.  2. Recent history of type 2 diabetes mellitus noted. DD:  06/19/00 TD:  06/20/00 Job: [id number]

## 2011-04-15 NOTE — Op Note (Signed)
[place]. [facility]  Patient:    Holly Sparks, Holly Sparks                    MRN: [id number] Proc. Date: 06/19/00 Adm. Date:  [id number] Attending:  Tressie Stalker D                           Operative Report  BRIEF HISTORY:  The patient is a 70 year old white female, who suffers from approximately 73-month history of right arm pain.  She failed medical management and was therefore worked up with a cervical MRI that demonstrated large herniated nucleus pulposus at C6-7 on the right.  Her signs, symptoms and physical examination were consistent with a right C7 radiculopathy.  She failed medical management and therefore weighed the risks, benefits, and alternatives to surgery and desired to proceed with the C6-7 anterior cervical diskectomy with fusion and plating.  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  PROCEDURE:  C6-7 anterior cervical diskectomy and interbody iliac crest allograft arthrodesis with anterior cervical plating C6-7 using the Codman anterior cervical plate and screws (titanium).  SURGEON:  Cristi Loron, M.D.  ASSISTANT:  Tanya Nones. Jeral Fruit, M.D.  ANESTHESIA:  General endotracheal.  ESTIMATED BLOOD LOSS:  Less than 150 cc.  SPECIMENS:  None.  COMPLICATIONS:  None.  DRAINS:  None.  DESCRIPTION OF PROCEDURE:  The patient was brought to the operating room by the anesthesia team, general endotracheal anesthesia was induced.  The patient remained in the supine position.  A roll was placed under her shoulders to place her neck in slight extension.  Anterior cervical region was then prepared with Betadine scrub and Betadine solution.  Sterile drapes were applied.  I then injected the area to be incised with Marcaine with epinephrine solution and  used the scalpel to make a left-sided anterior cervical incision transversely.  I used the Metzenbaum scissors to dissect down to the platysma muscle and divided it along the direction of the skin incision.  I then dissected medial to the sternocleidomastoid muscle, jugular vein, carotid artery and then bluntly dissected down to the anterior cervical spine.  Carefully identified the esophagus and retracted it medially.  I cleared soft tissue from anterior cervical spine using Kitner swabs.  I inserted a bent spinal needle into one of the upper exposed interspaces and then obtained an intraoperative radiography.  It demonstrated the needle was at C4-5.  I then counted down to the C6-7 interspace and then used electrocautery to detach the medial border of the longus colli muscle bilaterally from the C6-7 interspace.  I inserted the Caspar self-retaining retractor for exposure and then used the Midas Rex high-speed drill to drill off some anterior osteophytes along the anterior aspect of the disk space.  I then inserted distraction pins at C6 and C7 vertebral bodies and distracted the interspace.  I brought the operative microscope into the field and under ______ magnification and illumination, I completed the decompression.  I performed a partial diskectomy with the pituitary forceps and Carlens curets and then drilled away the remainder of the intervertebral disk with the Midas Rex high-speed drill and decorticated the vertebral endplates at C6 and C7, drilling down until I encountered the posterior longitudinal ligament and some large bone spurs.  I drilled off the bone spurs, thinned out the ligament  with the drill and then incised the thinned out posterior longitudinal ligament with the arachnoid knife.  There was a large right herniated nucleus pulposus/spondylosis.  I had removed the remainder of the ligamentum flavum with a Kerrison punch, undercutting the vertebral endplate at  C6-7 decompressing the thecal sac.  I performed a generous foraminotomy about the bilateral C7 nerve roots.  I then palpated about the bilateral C7 nerve roots and noted that they were well-decompressed throughout the neural foramen.  Having completed the anterior cervical diskectomy, I now turn my attention to arthrodesis.  I obtained an iliac crest tricortical allograft specimen and fashioned it to these approximate dimensions - 1 cm in depth and 6 or 7 cm in height.  I then inserted into the distracted interspace, removed the distraction pins and there was a good snug fit of the bone graft.  Having completed the anterior cervical fusion, I now turn my attention to the spinal instrumentation.  I obtained the appropriate length Codman anterior cervical plate, laid along the anterior aspect of C6 and C7 (I had previously removed some bone spurs from the anterior aspect of the vertebral bodies with the drill).  I then drilled two holes at C6, two at C7, tapped these holes and secured the plate to the vertebral bodies with 12 mm screws.  I obtained an intraoperative radiograph that demonstrated good position of the plate and screws, the best I could see down at C6-7, I then secured the screws to the plate with the cam tightener and then copiously irrigated out the wound with bacitracin solution.  I achieved stringent hemostasis with bipolar electrocautery and Gelfoam.  I then removed the Caspar self-retaining retractor and inspected the esophagus for any damage, there was none.  I then reapproximated the patients platysma muscle and subcutaneous tissue with interrupted 3-0 Vicryl suture and the skin with Steri-Strips and benzoin.  The wound was then coated with bacitracin ointment and sterile dressing applied. The drapes were removed and the patient was subsequently extubated by the anesthesia team and transported to the post anesthesia care unit in stable condition.  All sponge, instrument  and needle counts were correct at the end of this case. DD:  06/19/00 TD:  06/20/00 Job: [id number]

## 2011-04-15 NOTE — Op Note (Signed)
[place]. [facility]  Patient:    Holly Sparks, Holly Sparks                    MRN: [id number] Proc. Date: 06/19/00 Adm. Date:  [id number] Disc. Date: [id number] Attending:  Tressie Stalker D                           Operative Report  The original dictation of this operative note was lost and I am dictating now to the best of my recollection.   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  OPERATION:  C6-7 anterior cervical diskectomy, interbody iliac crest Allograft arthrodesis, anterior cervical plating (Codman).  SURGEON:  Cristi Loron, M.D.  ASSISTANT:  Tanya Nones. Jeral Fruit, M.D.  ANESTHESIA:  General endotracheal  ESTIMATED BLOOD LOSS:  Less than 100 cc.  SPECIMENS:  None.  DRAINS:  None.  COMPLICATIONS:  None.  BRIEF HISTORY:  The patient is a 70 year old white female who suffered from neck and right arm pain which failed medical management.  A cervical MRI demonstrated herniated nucleus pulposus at C6-7.  Her signs, symptoms and physical exam were consistent with a right C6-7 radiculopathy.  The patient therefore weighted the risks, benefits and alternatives of surgery and decided to proceed with an anterior cervical diskectomy and fusion, plating.  DESCRIPTION OF PROCEDURE:  The patient was brought to the operating room by the anesthesia team.  General endotracheal anesthesia was induced.  The patient remained in the supine position, a roll was placed under her shoulders, and placed her neck in slight extension.  Her anterior cervical region was then prepared with Betadine scrub and Betadine solution.  Sterile drapes were applied and injected the area to be incised with Marcaine solution.  Used scalpel to make a transverse  incision in the patients left anterior neck.  I dissected down to the platysma muscle and divided it along the direction of the skin incision with the Metzenbaum scissors and then dissected medial to the jugular vein and carotid artery, sternocleidomastoid muscle, etc and bluntly dissected down to the anterior cervical spine.  I identified the esophagus and carefully retracted it medially.  I cleared the soft tissue from anterior cervical spine with the Kitner swabs and then inserted a bent spinal needle into the exposed interspace.  I obtained intraoperative radiograph to confirm our location.  I then used electrocautery to detach the medial border of the longus colli muscle bilaterally from the C6-7 intervertebral disc space.  I inserted the Caspar self retaining retractor for exposure. Used a 15 blade scalpel to incise the C6-7 intervertebral disc and performed a partial diskectomy using the pituitary forceps.  I then inserted distraction screws at C6 and C7, distracted the interspace and then using Midas-Rex high speed to decorticate the C6-7 vertebral end plates and throw away the remainder of the intravertebral discs. I thinned out the posterior longitudinal ligament with the drill and then incised it with arachnoid knife.  Removed the ligament with the Kerrison punches the vertebra and placed at C6-7.  Identified the bilateral C7 nerve roots and performed foraminotomies about it bilaterally.  Then palpated about the thecal sac and bilateral C7 nerve roots and the neural structures were well decompressed.  Having completed the anterior cervical diskectomy, I turned my attention to the arthrodesis.  I obtained the iliac crest tricortical Allograft bone graft and fashioned it to these approximate dimensions:  6 to 7 mm in height, 1 cm in depth. I inserted it into the distracted interspace and removed the distraction pins.  There was good snug fit of the bone graft.  I then turned my  attention to anterior spinal instrumentation. I obtained the appropriate length Codman anterior cervical plate laid along the anterior aspect of C6 and 7.  Drilled two holes at C6 and two holes at C7.  Tapped holes and secured the plate to the vertebral bodies with 12 mm screws.  I then obtained intraoperative radiograph which demonstrated good position of the plate, screws and interbody grafts. I then secured the screws to the plates using the tightener and I copiously irrigated wound with Bacitracin solution.  I removed the solution and then removed the Caspar self retaining retractor and inspected the esophagus for any damage.  There was none.  I then reapproximated the patients platysma muscle with interrupted 3-0 Vicryl, subcutaneous tissue with interrupted 3-0 Vicryl and skin with Steri-Strips and Benzoin.  The wound was then coated with Bacitracin ointment and sterile dressing was applied.  The drapes were removed.  The patient was subsequently extubated by anesthesia team and transported to the premature atrial contractions in stable condition.  All sponge, instrument and needle counts were correct at the end of this case. DD:  07/27/00 TD:  07/28/00 Job: [id number]

## 2011-04-15 NOTE — Procedures (Signed)
   NAME:  Holly Sparks, Holly Sparks                       ACCOUNT NO.:  [id number]   MEDICAL RECORD NO.:  [id number]                   PATIENT TYPE:  INP   LOCATION:  IC10                                 FACILITY:  [facility]   PHYSICIAN:  Edward L. Juanetta Gosling, M.D.             DATE OF BIRTH:  09/25/1941   DATE OF PROCEDURE:  09/21/2003  DATE OF DISCHARGE:  09/24/2003                                EKG INTERPRETATION   The rhythm appears to be atrial fibrillation with a ventricular response of  around 100 to 105.  There are nonspecific ST-T wave changes laterally.  Abnormal electrocardiogram.      ___________________________________________                                            Oneal Deputy. Juanetta Gosling, M.D.   ELH/MEDQ  D:  09/25/2003  T:  09/26/2003  Job:  [id number]

## 2011-04-15 NOTE — Group Therapy Note (Signed)
   NAME:  Holly Sparks, Holly Sparks                       ACCOUNT NO.:  [id number]   MEDICAL RECORD NO.:  [id number]                   PATIENT TYPE:  INP   LOCATION:  IC10                                 FACILITY:  [facility]   PHYSICIAN:  Mila Homer. [name], M.D.           DATE OF BIRTH:  1941-02-01   DATE OF PROCEDURE:  09/23/2003  DATE OF DISCHARGE:                                   PROGRESS NOTE   SUBJECTIVE:  The patient was seen by Dr. Susa Griffins, cardiologist,  today.  She has really been feeling quite good.   OBJECTIVE:  GENERAL:  She is supine in bed tonight.  She is oriented and  alert.  No acute distress.  Well developed, well nourished.  HEART:  Regular rhythm.  Rate of 60.  LUNGS:  Clear throughout.  She is moving air well.  ABDOMEN:  Soft without hepatosplenomegaly or mass.  No abdominal tenderness.  EXTREMITIES:  No edema at the ankles.   ASSESSMENT:  Atrial fibrillation, now normal sinus rhythm off Cardizem drip.   PLAN:  Continue the Diltiazem 60 mg q.6 h. tonight, switch to Diltiazem LA  120 mg q.a.m. in the a.m.  Continue with enalapril 10 mg daily with  Metoprolol 25 mg b.i.d.  Continue Glucosone 45 mg daily and Simvastatin 80  mg daily.  Discharge home tomorrow if continuing to do well.  We will plan  for a Cardiolite stress by Dr. Alanda Amass in two days.  Note, I talked to Dr.  Alanda Amass about her at length on the phone this morning.      ___________________________________________                                            Mila Homer. [name], M.D.   SDK/MEDQ  D:  09/23/2003  T:  09/23/2003  Job:  [id number]

## 2011-04-15 NOTE — Discharge Summary (Signed)
NAME:  Holly Sparks, Holly Sparks                       ACCOUNT NO.:  [id number]   MEDICAL RECORD NO.:  [id number]                   PATIENT TYPE:  INP   LOCATION:  IC10                                 FACILITY:  [facility]   PHYSICIAN:  Mila Homer. [name], M.D.           DATE OF BIRTH:  1941-11-28   DATE OF ADMISSION:  09/21/2003  DATE OF DISCHARGE:  09/24/2003                                 DISCHARGE SUMMARY   This 70 year old woman was admitted to the hospital with palpitations, acute  onset, which started she was sitting watching television on a Sunday  afternoon.   She had a benign four-day hospitalization extending from October 24 to  September 24, 2003. Vital signs remained stable.   Admission CBC and MET 7 were essentially normal except potassium slightly  low at 3.4 and a glucose of 273. Her cardiac enzymes were negative.   EKG initially showed atrial fibrillation with rapid ventricular response,  rate greater than 150. On Cardizem the following day, she showed atrial  fibrillation but rate has slowed down to 70s, then she spontaneously  converted.   She was initially given Adenocard 6 mg IV and the started Cardizem 20 mg IV,  followed by a Cardizem drip. She was admitted to the ICU by Dr. Renard Matter, who  called for me. She was put on a 2,000 calorie ADA diet, put on a.c. and h.s.  Accu-Cheks of sliding scale Humalog insulin, given  Ambien 5 mg q.h.s.  p.r.n., morphine, O2 at 2 liters.   She was seen by [facility] in consult. They switched her from  IV Cardizem to Cardizem 60 mg p.o. q.6h. initially and then she took her  third day, and they by her fourth and  last day, she was put on Cardizem CD  120 mg q.a.m. She was given enteric-coated aspirin 325 mg q.d. She was put  on Actos 45 mg q.d., enalapril 10 mg q.d., and simvastatin 80 mg q.d. She is  also on metoprolol 25 mg b.i.d. A 2-D cardiac echocardiogram was essentially  normal.   She remained free of atrial  fibrillation in normal sinus rhythm for two days  and was felt to be stable for discharge home directly from the ICU on her  fourth day.   Arrangements were made for her to have a Cardiolite stress through Dr.  Alanda Amass two days after discharge. Was followed up with him in the office  three days later for review of all of the tests.   DISCHARGE MEDICATIONS:  1. Aspirin 325 mg q.d.  2. Diltiazem LA 120 mg q.a.m. (50 with one refill).  3. Metoprolol 50 mg half a tablet b.i.d.  4. Enalapril 20 mg one a half q.a.m.  5. Actos 45 mg q.d.  6. Lipitor 10 mg q.d.   She was to hold the metoprolol the full day prior to her stress test.  Discussed all of this with her and will get  followup in two weeks.   FINAL DIAGNOSES:  1. Atrial fibrillation with rapid ventricular response.  2. Type 2 diabetes.  3. Hypercholesterolemia.     ___________________________________________                                         Mila Homer. [name], M.D.   SDK/MEDQ  D:  09/24/2003  T:  09/24/2003  Job:  [id number]   cc:   Gerlene Burdock A. Alanda Amass, M.D.  [phone] [address], [address]  [place]  [place] [address]  Fax: [phone]

## 2011-04-15 NOTE — Procedures (Signed)
   NAME:  Holly Sparks, Holly Sparks                       ACCOUNT NO.:  [id number]   MEDICAL RECORD NO.:  [id number]                   PATIENT TYPE:  INP   LOCATION:  IC10                                 FACILITY:  [facility]   PHYSICIAN:  Richard A. Alanda Amass, M.D.          DATE OF BIRTH:  02-Jun-1941   DATE OF PROCEDURE:  09/23/2003  DATE OF DISCHARGE:                                  ECHOCARDIOGRAM   HISTORY:  This is a 70 year old woman who has a history of AODM, exogenous  obesity, hypertension, hyperlipidemia and recent onset of paroxysmal atrial  fibrillation converted medically at [facility] on September 22, 2003.   The aorta is normal at 3.1 cm.  The aortic valve has three leaflets and  opens normally.  There was mild sclerosis and no aortic stenosis or AI.   The left atrium is enlarged at 4.8 cm.  The patient was in sinus rhythm  during the study, and there were no clots seen.   IVS and LVPW measure 1.4 and 1.0 cm respectively.  There was mild asymmetric  hypertrophy of the septum.  There was no outflow tract gradient, and there  was normal contraction pattern.   The mitral valve shows mild annular calcification and opens normally.  There  is mild mitral regurgitation present.   Left ventricular internal dimensions are within normal limits.  The LVIDD  equals 4.5 cm, LVISD equaled 3.3 cm.  There are no segmental wall motion  abnormalities.  Estimated EF is approximately 55-60%.  LV inflow signal is  normal with no evidence of diastolic relaxation abnormality.   The right ventricle is normal.  There is mild tricuspid regurgitation and  normal resting estimated RV systolic pressure.  There was no pericardial  effusion.   2-D ECHOCARDIOGRAM:  1. The 2-D echocardiogram demonstrates normal systolic function, no     diastolic relaxation abnormality and mild     asymmetric ventricular hypertrophy.  2. There is no significant valvular abnormality.  3. There was mild-to-moderate left atrial  enlargement.      ___________________________________________                                            Pearletha Furl. Alanda Amass, M.D.   RAW/MEDQ  D:  09/23/2003  T:  09/23/2003  Job:  [id number]   cc:   Mila Homer. [name], M.D.  [address] [place], [place] [address]  Fax: [phone]   Renee Harder  Copy to [facility]  Copy to [facility]

## 2011-04-15 NOTE — H&P (Signed)
   NAME:  Holly Sparks, Holly Sparks                       ACCOUNT NO.:  [id number]   MEDICAL RECORD NO.:  [id number]                   PATIENT TYPE:  INP   LOCATION:  IC10                                 FACILITY:  [facility]   PHYSICIAN:  Angus G. Renard Matter, M.D.              DATE OF BIRTH:  Aug 31, 1941   DATE OF ADMISSION:  09/21/2003  DATE OF DISCHARGE:                                HISTORY & PHYSICAL   HISTORY OF PRESENT ILLNESS:  This 70 year old white female presented herself  to the ED with the chief complaint being palpitations, rapid heartbeat of  acute onset.  The patient was evaluated by ED physician and was noted to  have rapid heartbeat, felt first to be either SVT or atrial fibrillation.  She was treated with Adenocard to no avail and then given a bolus of  Cardizem intravenously 20 mg.  Her rate slowed, and it was obviously atrial  fibrillation.  At this point she was also bolused with heparin 5000 units  and started on heparin drip and started on a Cardizem drip.  The patient was  admitted for further evaluation in ICU.   SOCIAL HISTORY:  The patient does not smoke or drink alcohol.   FAMILY HISTORY:  There is no heart disease, etc.   PAST MEDICAL AND SURGICAL HISTORY:  1. The patient has a history of hypertension.  2. Non-insulin-dependent diabetes.  3. She had cervical laminectomy recently.   ALLERGIES:  The patient has no allergies.   MEDICATION LIST:  Lipitor, Actos, enalapril, Evista.   PHYSICAL EXAMINATION:  VITAL SIGNS:  Blood pressure 164/123, respirations  20, pulse 164, temperature 96.9.  HEENT:  Eyes:  PERRLA.  TMs negative.  Oropharynx benign.  NECK:  Supple.  No JVD or thyroid abnormalities.  HEART:  Rapid irregular rhythm.  No murmurs.  No cardiomegaly.  ABDOMEN:  No palpable organs or masses.  EXTREMITIES:  Mild edema bilaterally.  NEUROLOGIC:  No focal deficit.   DIAGNOSES:  1. Acute onset of atrial fibrillation.  2. Non-insulin-dependent diabetes.    PERTINENT LABORATORY DATA:  CBC:  WBC 3400, hemoglobin 13.5, hematocrit 40.  Chemistries:  Sodium 140, potassium 3.4, chloride 103, CO2 30, glucose 273,  BUN 16, creatinine 1.  Cardiac enzymes:  CPK 78, CPK-MB 1.5, troponin 0.02.     ___________________________________________                                         Ishmael Holter. Renard Matter, M.D.   AGM/MEDQ  D:  09/21/2003  T:  09/21/2003  Job:  [id number]

## 2011-04-15 NOTE — Discharge Summary (Signed)
[place]. [facility]  Patient:    Holly Sparks, Holly Sparks                    MRN: [id number] Adm. Date:  [id number] Disc. Date: [id number] Attending:  Tressie Stalker D                           Discharge Summary  For full details of this admission, please refer to typed history and physical.  BRIEF HISTORY:  The patient is a 70 year old white female who suffers from neck and right arm pain.  She failed medical management and was worked up as an outpatient with a cervical MRI that demonstrated herniated disc at C6-7. Her signs and symptoms at physical examination were consistent with right C7 radiculopathy.  She, therefore, weighed the risks, benefits, and alternatives of surgery and decided to proceed with surgery.  For past medical history, past surgical history, medications prior to admission, drug allergies, family medical history, social history, admission physical examination, imaging status, assessment and plan, etc., please refer to typed history and physical.  HOSPITAL COURSE:  I performed a C6-7 anterior cervical discectomy with fusion plating on the patient on June 19, 2000, without complications (for full details of this operation, please refer to typed operative note).  POSTOPERATIVE COURSE:  The patients postoperative course was unremarkable. She remained afebrile.  Vital signs were stable.  By postoperative day #1 she was eating well and ambulating well.  Her wound was healing well without signs of infection.  Her motor strength was normal.  She was requesting discharge to home.  She was, therefore, discharged to home on June 20, 2000.  DISCHARGE INSTRUCTIONS:  The patient was given written discharge instructions. She was instructed to follow up with me in three weeks and to wear soft collar.  DISCHARGE MEDICATIONS: 1. Tylox #60 one to two p.o. q.4h. p.r.n. for pain with no refills. 2. Valium 5 mg #40 one p.o. q.6h. p.r.n. for muscle spasms  with one refill. 3. She was instructed to resume her outpatient medical regimen of Prinivil    and E-Vista.  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  PROCEDURE PERFORMED:  C6-7 anterior cervical discectomy, inner body iliac crest allograft arthrodesis, anterior cervical plating (Codman) C6-7. DD:  06/20/00 TD:  06/22/00 Job: [id number]

## 2012-02-29 ENCOUNTER — Other Ambulatory Visit (HOSPITAL_COMMUNITY): Payer: Self-pay | Admitting: Family Medicine

## 2012-02-29 DIAGNOSIS — Z139 Encounter for screening, unspecified: Secondary | ICD-10-CM

## 2012-03-06 ENCOUNTER — Ambulatory Visit (HOSPITAL_COMMUNITY)
Admission: RE | Admit: 2012-03-06 | Discharge: 2012-03-06 | Disposition: A | Discharge status: Home / Self Care | Payer: Medicare Other | Source: Ambulatory Visit | Attending: Family Medicine | Admitting: Family Medicine

## 2012-03-06 DIAGNOSIS — Z139 Encounter for screening, unspecified: Secondary | ICD-10-CM

## 2012-03-06 DIAGNOSIS — Z780 Asymptomatic menopausal state: Secondary | ICD-10-CM | POA: Insufficient documentation

## 2012-03-06 DIAGNOSIS — Z13820 Encounter for screening for osteoporosis: Secondary | ICD-10-CM | POA: Insufficient documentation

## 2013-04-16 ENCOUNTER — Encounter (INDEPENDENT_AMBULATORY_CARE_PROVIDER_SITE_OTHER): Payer: Self-pay | Admitting: *Deleted

## 2013-04-24 ENCOUNTER — Encounter (INDEPENDENT_AMBULATORY_CARE_PROVIDER_SITE_OTHER): Payer: Self-pay | Admitting: Internal Medicine

## 2013-04-24 ENCOUNTER — Telehealth (INDEPENDENT_AMBULATORY_CARE_PROVIDER_SITE_OTHER): Payer: Self-pay | Admitting: *Deleted

## 2013-04-24 ENCOUNTER — Ambulatory Visit (INDEPENDENT_AMBULATORY_CARE_PROVIDER_SITE_OTHER): Payer: Medicare Other | Admitting: Internal Medicine

## 2013-04-24 ENCOUNTER — Other Ambulatory Visit (INDEPENDENT_AMBULATORY_CARE_PROVIDER_SITE_OTHER): Payer: Self-pay | Admitting: *Deleted

## 2013-04-24 VITALS — BP 124/72 | HR 60 | Ht 66.0 in | Wt 170.0 lb

## 2013-04-24 DIAGNOSIS — I10 Essential (primary) hypertension: Secondary | ICD-10-CM | POA: Insufficient documentation

## 2013-04-24 DIAGNOSIS — K589 Irritable bowel syndrome without diarrhea: Secondary | ICD-10-CM

## 2013-04-24 DIAGNOSIS — Z1211 Encounter for screening for malignant neoplasm of colon: Secondary | ICD-10-CM

## 2013-04-24 DIAGNOSIS — E7800 Pure hypercholesterolemia, unspecified: Secondary | ICD-10-CM | POA: Insufficient documentation

## 2013-04-24 DIAGNOSIS — E119 Type 2 diabetes mellitus without complications: Secondary | ICD-10-CM | POA: Insufficient documentation

## 2013-04-24 MED ORDER — HYOSCYAMINE SULFATE 0.125 MG SL SUBL: 0.1250 mg | SUBLINGUAL_TABLET | SUBLINGUAL | Status: DC | PRN

## 2013-04-24 NOTE — Patient Instructions (Addendum)
Rx for Levsin. Colonoscopy with Dr. Karilyn Cota. The risks and benefits such as perforation, bleeding, and infection were reviewed with the patient and is agreeable.

## 2013-04-24 NOTE — Telephone Encounter (Signed)
Patient need movi prep 

## 2013-04-24 NOTE — Progress Notes (Signed)
Subjective:     Patient ID: Holly Sparks, female   DOB: [birth year], 72 y.o.   MRN: [id number]  HPI Referred to our office for constipation/diarrhea. Diagnosed with IBS by Dr. [name]. If she becomes nervous or upset she will have diarrhea. Symptoms of IBS for years. She usually has about 2 stools a day and sometimes she will go 3 days without having a stool. This weekend she had to take an enema because she was constipated. She underwent a colonoscopy 10 yrs and was normal. I was unable to locate the report.  Appetite is good. No weight loss. No melena or bright red rectal bleeding No abdominal pain today. When she has diarrhea, she will have lower abdominal cramps.   03/28/2013 H and H 13.9 and 41.4, Platelet ct 144 Review of Systems Current Outpatient Prescriptions  Medication Sig Dispense Refill  . aspirin 81 MG tablet Take 81 mg by mouth daily.      Marland Kitchen aspirin-acetaminophen-caffeine (EXCEDRIN MIGRAINE) 250-250-65 MG per tablet Take 1 tablet by mouth every 6 (six) hours as needed for pain.      Marland Kitchen atorvastatin (LIPITOR) 10 MG tablet Take 10 mg by mouth daily.      . cholecalciferol (VITAMIN D) 1000 UNITS tablet Take 1,000 Units by mouth 2 (two) times daily before a meal.      . HYDROcodone-acetaminophen (VICODIN) 5-500 MG per tablet Take 1 tablet by mouth every 6 (six) hours as needed for pain.      Marland Kitchen lisinopril-hydrochlorothiazide (PRINZIDE,ZESTORETIC) 20-25 MG per tablet Take 1 tablet by mouth daily.      Marland Kitchen LORazepam (ATIVAN) 0.5 MG tablet Take 0.5 mg by mouth every 8 (eight) hours.      . metFORMIN (GLUCOPHAGE) 500 MG tablet Take 500 mg by mouth 2 (two) times daily with a meal.      . metoprolol succinate (TOPROL-XL) 50 MG 24 hr tablet Take 50 mg by mouth 2 (two) times daily before a meal. Take with or immediately following a meal.       No current facility-administered medications for this visit.   Past Medical History  Diagnosis Date  . Hypertension     x 10 yrs  . Diabetes      Type 2 x 10 yrs  . High cholesterol    Past Surgical History  Procedure Laterality Date  . Neck surgery      x 2 for spur and disc problems   No Known Allergies     Objective:   Physical Exam  Filed Vitals:   04/24/13 1554  BP: 124/72  Pulse: 60  Height: 5\' 6"  (1.676 m)  Weight: 170 lb (77.111 kg)  Alert and oriented. Skin warm and dry. Oral mucosa is moist.   . Sclera anicteric, conjunctivae is pink. Thyroid not enlarged. No cervical lymphadenopathy. Lungs clear. Heart regular rate and rhythm.  Abdomen is soft. Bowel sounds are positive. No hepatomegaly. No abdominal masses felt. No tenderness.  No edema to lower extremities.         Assessment:    Probable IBS. Las colonoscopy was 10 yrs ago. Surveillance colonoscopy due. She has no alarm symptoms    Plan:     Rx for Levsin for her abdominal cramps. Colonoscopy with Dr. Karilyn Cota

## 2013-04-25 MED ORDER — PEG-KCL-NACL-NASULF-NA ASC-C 100 G PO SOLR: 1.0000 | Freq: Once | ORAL | Status: DC

## 2013-04-29 ENCOUNTER — Encounter (INDEPENDENT_AMBULATORY_CARE_PROVIDER_SITE_OTHER): Payer: Self-pay

## 2013-05-08 ENCOUNTER — Ambulatory Visit (HOSPITAL_COMMUNITY)
Admission: RE | Admit: 2013-05-08 | Discharge: 2013-05-08 | Disposition: A | Discharge status: Home / Self Care | Payer: Medicare Other | Source: Ambulatory Visit | Attending: Internal Medicine | Admitting: Internal Medicine

## 2013-05-08 ENCOUNTER — Encounter (HOSPITAL_COMMUNITY)
Admission: RE | Disposition: A | Discharge status: Home / Self Care | Payer: Self-pay | Source: Ambulatory Visit | Attending: Internal Medicine

## 2013-05-08 ENCOUNTER — Encounter (HOSPITAL_COMMUNITY): Payer: Self-pay | Admitting: *Deleted

## 2013-05-08 DIAGNOSIS — I10 Essential (primary) hypertension: Secondary | ICD-10-CM | POA: Insufficient documentation

## 2013-05-08 DIAGNOSIS — K644 Residual hemorrhoidal skin tags: Secondary | ICD-10-CM

## 2013-05-08 DIAGNOSIS — K5730 Diverticulosis of large intestine without perforation or abscess without bleeding: Secondary | ICD-10-CM

## 2013-05-08 DIAGNOSIS — Z1211 Encounter for screening for malignant neoplasm of colon: Secondary | ICD-10-CM | POA: Insufficient documentation

## 2013-05-08 DIAGNOSIS — Z79899 Other long term (current) drug therapy: Secondary | ICD-10-CM | POA: Insufficient documentation

## 2013-05-08 DIAGNOSIS — Z7982 Long term (current) use of aspirin: Secondary | ICD-10-CM | POA: Insufficient documentation

## 2013-05-08 DIAGNOSIS — K589 Irritable bowel syndrome without diarrhea: Secondary | ICD-10-CM | POA: Insufficient documentation

## 2013-05-08 DIAGNOSIS — E119 Type 2 diabetes mellitus without complications: Secondary | ICD-10-CM | POA: Insufficient documentation

## 2013-05-08 DIAGNOSIS — E7800 Pure hypercholesterolemia, unspecified: Secondary | ICD-10-CM | POA: Insufficient documentation

## 2013-05-08 HISTORY — PX: COLONOSCOPY: SHX5424

## 2013-05-08 LAB — GLUCOSE, CAPILLARY: Glucose-Capillary: 156 mg/dL — ABNORMAL HIGH (ref 70–99)

## 2013-05-08 SURGERY — COLONOSCOPY
Anesthesia: Moderate Sedation

## 2013-05-08 MED ORDER — MIDAZOLAM HCL 5 MG/5ML IJ SOLN
INTRAMUSCULAR | Status: AC
  Filled 2013-05-08: qty 10

## 2013-05-08 MED ORDER — MEPERIDINE HCL 50 MG/ML IJ SOLN
INTRAMUSCULAR | Status: AC
  Filled 2013-05-08: qty 1

## 2013-05-08 MED ORDER — MIDAZOLAM HCL 5 MG/5ML IJ SOLN
INTRAMUSCULAR | Status: DC | PRN
  Administered 2013-05-08 (×3): 2 mg via INTRAVENOUS

## 2013-05-08 MED ORDER — SODIUM CHLORIDE 0.9 % IV SOLN
INTRAVENOUS | Status: DC
  Administered 2013-05-08: 10:00:00 via INTRAVENOUS

## 2013-05-08 MED ORDER — MEPERIDINE HCL 50 MG/ML IJ SOLN
INTRAMUSCULAR | Status: DC | PRN
  Administered 2013-05-08 (×2): 25 mg via INTRAVENOUS

## 2013-05-08 MED ORDER — STERILE WATER FOR IRRIGATION IR SOLN
Status: DC | PRN
  Administered 2013-05-08: 10:00:00

## 2013-05-08 NOTE — H&P (Signed)
Holly Sparks is an 72 y.o. female.   Chief Complaint: Patient is here for colonoscopy. HPI: Patient is 72 year old Caucasian female present for screening colonoscopy. Her last examination subcutaneous. She denies abdominal pain or rectal bleeding. She has history of IBS and she started and/or constipation. She has lost about 10 pounds this year voluntarily. Family history is negative for CRC.  Past Medical History  Diagnosis Date  . Hypertension     x 10 yrs  . 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 High cholesterol     Past Surgical History  Procedure Laterality Date  . Neck surgery      x 2 for spur and disc problems  . Colonoscopy      History reviewed. No pertinent family history. Social History:  reports that she has never smoked. She does not have any smokeless tobacco history on file. She reports that she does not drink alcohol or use illicit drugs.  Allergies: No Known Allergies  Medications Prior to Admission  Medication Sig Dispense Refill  . aspirin 81 MG tablet Take 81 mg by mouth daily.      Marland Kitchen atorvastatin (LIPITOR) 10 MG tablet Take 10 mg by mouth daily.      Marland Kitchen HYDROcodone-acetaminophen (VICODIN) 5-500 MG per tablet Take 1 tablet by mouth every 6 (six) hours as needed for pain.      . hyoscyamine (LEVSIN SL) 0.125 MG SL tablet Place 1 tablet (0.125 mg total) under the tongue every 4 (four) hours as needed for cramping.  60 tablet  1  . lisinopril-hydrochlorothiazide (PRINZIDE,ZESTORETIC) 20-25 MG per tablet Take 1 tablet by mouth daily.      Marland Kitchen LORazepam (ATIVAN) 0.5 MG tablet Take 0.5 mg by mouth every 8 (eight) hours.      . metFORMIN (GLUCOPHAGE) 500 MG tablet Take 500 mg by mouth 2 (two) times daily with a meal.      . metoprolol succinate (TOPROL-XL) 50 MG 24 hr tablet Take 12.5 mg by mouth 2 (two) times daily before a meal. Take with or immediately following a meal.      . aspirin-acetaminophen-caffeine (EXCEDRIN MIGRAINE) 250-250-65 MG per tablet Take 1 tablet  by mouth every 6 (six) hours as needed for pain.        Results for orders placed during the hospital encounter of 05/08/13 (from the past 48 hour(s))  GLUCOSE, CAPILLARY     Status: Abnormal   Collection Time    05/08/13  9:41 AM      Result Value Range   Glucose-Capillary 156 (*) 70 - 99 mg/dL   No results found.  ROS  Blood pressure 133/81, pulse 63, temperature 98.1 F (36.7 C), temperature source Oral, resp. rate 18, SpO2 97.00%. Physical Exam   Assessment/Plan Average risk screening colonoscopy.  REHMAN,NAJEEB U 05/08/2013, 10:25 AM

## 2013-05-08 NOTE — Op Note (Signed)
COLONOSCOPY PROCEDURE REPORT  PATIENT:  Holly Sparks  MR#:  [id number] Birthdate:  10-31-1941, 72 y.o., female Endoscopist:  Dr. Malissa Hippo, MD Referred By:  Dr. Mila Homer. [name], MD  Procedure Date: 05/08/2013  Procedure:   Colonoscopy  Indications:  Patient is 72 year old Caucasian female who is undergoing average risk screening colonoscopy.  Informed Consent:  The procedure and risks were reviewed with the patient and informed consent was obtained.  Medications:  Demerol 50 mg IV Versed 6 mg IV  Description of procedure:  After a digital rectal exam was performed, that colonoscope was advanced from the anus through the rectum and colon to the area of the cecum, ileocecal valve and appendiceal orifice. The cecum was deeply intubated. These structures were well-seen and photographed for the record. From the level of the cecum and ileocecal valve, the scope was slowly and cautiously withdrawn. The mucosal surfaces were carefully surveyed utilizing scope tip to flexion to facilitate fold flattening as needed. The scope was pulled down into the rectum where a thorough exam including retroflexion was performed.  Findings:   Prep satisfactory. Few diverticula at hepatic flexure and at sigmoid colon. No evidence of colonic polyps. Normal rectal mucosa. Prominent hemorrhoids below the dentate line.   Therapeutic/Diagnostic Maneuvers Performed:  None  Complications:  None  Cecal Withdrawal Time:  14 minutes  Impression:  Examination performed to cecum. Few diverticula at hepatic flexure and sigmoid colon. External hemorrhoids.   Recommendations:  Standard instructions given. High-fiber diet. Metamucil 3 to 4 g by mouth daily at bedtime. Next screening exam in 10 years.  REHMAN,NAJEEB U  05/08/2013 11:02 AM  CC: Dr. Milana Obey, MD & Dr. Bonnetta Barry ref. provider found

## 2013-05-09 ENCOUNTER — Encounter (HOSPITAL_COMMUNITY): Payer: Self-pay | Admitting: Internal Medicine

## 2014-01-03 ENCOUNTER — Other Ambulatory Visit (HOSPITAL_COMMUNITY): Payer: Self-pay | Admitting: Internal Medicine

## 2014-01-03 DIAGNOSIS — Z139 Encounter for screening, unspecified: Secondary | ICD-10-CM

## 2014-01-09 ENCOUNTER — Ambulatory Visit (HOSPITAL_COMMUNITY)
Admission: RE | Admit: 2014-01-09 | Discharge: 2014-01-09 | Disposition: A | Discharge status: Home / Self Care | Payer: Medicare Other | Source: Ambulatory Visit | Attending: Internal Medicine | Admitting: Internal Medicine

## 2014-01-09 ENCOUNTER — Other Ambulatory Visit (HOSPITAL_COMMUNITY): Payer: Self-pay | Admitting: Internal Medicine

## 2014-01-09 DIAGNOSIS — Z1231 Encounter for screening mammogram for malignant neoplasm of breast: Secondary | ICD-10-CM | POA: Insufficient documentation

## 2014-10-13 ENCOUNTER — Encounter (HOSPITAL_COMMUNITY): Payer: Self-pay | Admitting: Emergency Medicine

## 2014-10-13 ENCOUNTER — Emergency Department (HOSPITAL_COMMUNITY)
Admission: EM | Admit: 2014-10-13 | Discharge: 2014-10-14 | Disposition: A | Discharge status: Home / Self Care | Payer: Medicare Other | Attending: Emergency Medicine | Admitting: Emergency Medicine

## 2014-10-13 DIAGNOSIS — K580 Irritable bowel syndrome with diarrhea: Secondary | ICD-10-CM | POA: Diagnosis not present

## 2014-10-13 DIAGNOSIS — Z9889 Other specified postprocedural states: Secondary | ICD-10-CM | POA: Insufficient documentation

## 2014-10-13 DIAGNOSIS — E119 Type 2 diabetes mellitus without complications: Secondary | ICD-10-CM | POA: Insufficient documentation

## 2014-10-13 DIAGNOSIS — E780 Pure hypercholesterolemia: Secondary | ICD-10-CM | POA: Diagnosis not present

## 2014-10-13 DIAGNOSIS — R1010 Upper abdominal pain, unspecified: Secondary | ICD-10-CM

## 2014-10-13 DIAGNOSIS — Z79899 Other long term (current) drug therapy: Secondary | ICD-10-CM | POA: Insufficient documentation

## 2014-10-13 DIAGNOSIS — Z7982 Long term (current) use of aspirin: Secondary | ICD-10-CM | POA: Insufficient documentation

## 2014-10-13 DIAGNOSIS — I10 Essential (primary) hypertension: Secondary | ICD-10-CM | POA: Diagnosis not present

## 2014-10-13 DIAGNOSIS — K589 Irritable bowel syndrome without diarrhea: Secondary | ICD-10-CM

## 2014-10-13 LAB — CBC WITH DIFFERENTIAL/PLATELET
BASOS ABS: 0 10*3/uL (ref 0.0–0.1)
BASOS PCT: 0 % (ref 0–1)
Eosinophils Absolute: 0.1 10*3/uL (ref 0.0–0.7)
Eosinophils Relative: 1 % (ref 0–5)
HEMATOCRIT: 40.5 % (ref 36.0–46.0)
Hemoglobin: 14 g/dL (ref 12.0–15.0)
LYMPHS PCT: 39 % (ref 12–46)
Lymphs Abs: 2.9 10*3/uL (ref 0.7–4.0)
MCH: 30.7 pg (ref 26.0–34.0)
MCHC: 34.6 g/dL (ref 30.0–36.0)
MCV: 88.8 fL (ref 78.0–100.0)
Monocytes Absolute: 0.5 10*3/uL (ref 0.1–1.0)
Monocytes Relative: 7 % (ref 3–12)
NEUTROS ABS: 3.8 10*3/uL (ref 1.7–7.7)
NEUTROS PCT: 53 % (ref 43–77)
Platelets: 197 10*3/uL (ref 150–400)
RBC: 4.56 MIL/uL (ref 3.87–5.11)
RDW: 13.7 % (ref 11.5–15.5)
WBC: 7.3 10*3/uL (ref 4.0–10.5)

## 2014-10-13 LAB — LIPASE, BLOOD: LIPASE: 32 U/L (ref 11–59)

## 2014-10-13 LAB — COMPREHENSIVE METABOLIC PANEL
ALBUMIN: 4.3 g/dL (ref 3.5–5.2)
ALK PHOS: 86 U/L (ref 39–117)
ALT: 14 U/L (ref 0–35)
ANION GAP: 15 (ref 5–15)
AST: 21 U/L (ref 0–37)
BILIRUBIN TOTAL: 0.4 mg/dL (ref 0.3–1.2)
BUN: 29 mg/dL — AB (ref 6–23)
CO2: 26 mEq/L (ref 19–32)
CREATININE: 1.18 mg/dL — AB (ref 0.50–1.10)
Calcium: 10.3 mg/dL (ref 8.4–10.5)
Chloride: 97 mEq/L (ref 96–112)
GFR calc non Af Amer: 45 mL/min — ABNORMAL LOW (ref >90)
GFR, EST AFRICAN AMERICAN: 52 mL/min — AB (ref >90)
GLUCOSE: 189 mg/dL — AB (ref 70–99)
POTASSIUM: 4.5 meq/L (ref 3.7–5.3)
Sodium: 138 mEq/L (ref 137–147)
TOTAL PROTEIN: 7.6 g/dL (ref 6.0–8.3)

## 2014-10-13 LAB — URINALYSIS, ROUTINE W REFLEX MICROSCOPIC
Bilirubin Urine: NEGATIVE
Glucose, UA: NEGATIVE mg/dL
Hgb urine dipstick: NEGATIVE
Ketones, ur: NEGATIVE mg/dL
NITRITE: NEGATIVE
PROTEIN: NEGATIVE mg/dL
SPECIFIC GRAVITY, URINE: 1.02 (ref 1.005–1.030)
UROBILINOGEN UA: 0.2 mg/dL (ref 0.0–1.0)
pH: 5.5 (ref 5.0–8.0)

## 2014-10-13 LAB — URINE MICROSCOPIC-ADD ON

## 2014-10-13 MED ORDER — TRAMADOL HCL 50 MG PO TABS: 50.0000 mg | ORAL_TABLET | Freq: Four times a day (QID) | ORAL | Status: DC | PRN

## 2014-10-13 MED ORDER — MORPHINE SULFATE 4 MG/ML IJ SOLN
4.0000 mg | Freq: Once | INTRAMUSCULAR | Status: AC
  Administered 2014-10-13: 4 mg via INTRAVENOUS
  Filled 2014-10-13: qty 1

## 2014-10-13 MED ORDER — ONDANSETRON HCL 4 MG/2ML IJ SOLN
4.0000 mg | Freq: Once | INTRAMUSCULAR | Status: AC
  Administered 2014-10-13: 4 mg via INTRAVENOUS
  Filled 2014-10-13: qty 2

## 2014-10-13 MED ORDER — SODIUM CHLORIDE 0.9 % IV BOLUS (SEPSIS)
500.0000 mL | Freq: Once | INTRAVENOUS | Status: AC
  Administered 2014-10-13: 500 mL via INTRAVENOUS

## 2014-10-13 NOTE — Discharge Instructions (Signed)

## 2014-10-13 NOTE — ED Notes (Signed)
Onset x 2 days, pain upper quadrant bilaterally, with diarrhea

## 2014-10-13 NOTE — ED Notes (Signed)
Pt ambulated to restroom & returned to room w/ no complications. 

## 2014-10-13 NOTE — ED Notes (Signed)
Pt ambulated independently to bathroom. Unable to make enough urine for urine sample. Will try again to collect sample in an hour.

## 2014-10-13 NOTE — ED Provider Notes (Signed)
CSN: [id number]     Arrival date & time 10/13/14  1718 History  This chart was scribed for Holly Sparks PayorPickering, MD by Gwenyth Oberatherine Macek, ED Scribe. This patient was seen in room [facility] and the patient's care was started at 9:16 PM.    Chief Complaint  Patient presents with  . Abdominal Pain   The history is provided by the patient. No language interpreter was used.    HPI Comments: Holly Sparks is a 73 y.o. female with a history of IBS who presents to the Emergency Department complaining of gradually worsening, 7/10 bilateral upper quadrant abdominal pain that started 2 days ago. She notes diarrhea as an associated symptom. Pt has history of similar symptoms associated with IBS, but is not sure if this a flare-up. She has tried Levsin with no relief to symptoms. Pt notes she has had a diagnostic colonoscopy which was unremarkable. She denies history of abdominal surgery and positive sick contact. Pt denies nausea, vomiting, fever and  decreased urination as associated symptoms.     Past Medical History  Diagnosis Date  . Hypertension     x 10 yrs  . 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 High cholesterol    Past Surgical History  Procedure Laterality Date  . Neck surgery      x 2 for spur and disc problems  . Colonoscopy    . Colonoscopy N/A 05/08/2013    Procedure: COLONOSCOPY;  Surgeon: Malissa HippoNajeeb U Rehman, MD;  Location: [facility];  Service: Endoscopy;  Laterality: N/A;  1015   No family history on file. History  Substance Use Topics  . Smoking status: Never Smoker   . Smokeless tobacco: Not on file  . Alcohol Use: No   OB History    No data available     Review of Systems  Constitutional: Negative for fever.  Gastrointestinal: Positive for abdominal pain and diarrhea. Negative for nausea and vomiting.  Genitourinary: Negative for decreased urine volume.  All other systems reviewed and are negative.   Allergies  Review of patient's allergies indicates no known  allergies.  Home Medications   Prior to Admission medications   Medication Sig Start Date End Date Taking? Authorizing Provider  aspirin 81 MG tablet Take 81 mg by mouth daily.   Yes Historical Provider, MD  aspirin EC 81 MG tablet Take 81 mg by mouth daily.   Yes Historical Provider, MD  atorvastatin (LIPITOR) 10 MG tablet Take 10 mg by mouth daily.   Yes Historical Provider, MD  Cholecalciferol (VITAMIN D) 2000 UNITS CAPS Take 1 capsule by mouth daily.   Yes Historical Provider, MD  hyoscyamine (LEVSIN SL) 0.125 MG SL tablet Place 1 tablet (0.125 mg total) under the tongue every 4 (four) hours as needed for cramping. 04/24/13  Yes Len Blalockerri L Setzer, NP  lisinopril-hydrochlorothiazide (PRINZIDE,ZESTORETIC) 20-25 MG per tablet Take 1 tablet by mouth daily.   Yes Historical Provider, MD  LORazepam (ATIVAN) 0.5 MG tablet Take 0.5 mg by mouth at bedtime. *May take up to 4 times daily as needed for anxiety*   Yes Historical Provider, MD  metFORMIN (GLUCOPHAGE) 500 MG tablet Take 500 mg by mouth 2 (two) times daily with a meal.   Yes Historical Provider, MD  metoprolol (LOPRESSOR) 50 MG tablet Take 50 mg by mouth daily.   Yes Historical Provider, MD  traMADol (ULTRAM) 50 MG tablet Take 1 tablet (50 mg total) by mouth every 6 (six) hours  as needed. 10/13/14   [name], MD   BP 142/62 mmHg  Pulse 53  Temp(Src) 98.2 F (36.8 C) (Oral)  Resp 14  Ht 5\' 6"  (1.676 m)  Wt 157 lb (71.215 kg)  BMI 25.35 kg/m2  SpO2 99% Physical Exam  Constitutional: She appears well-developed and well-nourished. No distress.  HENT:  Head: Normocephalic and atraumatic.  Eyes: Conjunctivae and EOM are normal.  Neck: Neck supple. No tracheal deviation present.  Cardiovascular: Normal rate.   Pulmonary/Chest: Effort normal. No respiratory distress.  Lungs clear bilaterally  Abdominal: There is no tenderness.  Normal to hyperactive bowel sounds; no hernias  Skin: Skin is warm and dry.  Psychiatric: She has a  normal mood and affect. Her behavior is normal.  Nursing note and vitals reviewed.   ED Course  Procedures (including critical care time)  COORDINATION OF CARE: 9:20 PM Discussed treatment plan which includes lab work and pt agreed to plan.  Results for orders placed or performed during the hospital encounter of 10/13/14  Comprehensive metabolic panel  Result Value Ref Range   Sodium 138 137 - 147 mEq/L   Potassium 4.5 3.7 - 5.3 mEq/L   Chloride 97 96 - 112 mEq/L   CO2 26 19 - 32 mEq/L   Glucose, Bld 189 (H) 70 - 99 mg/dL   BUN 29 (H) 6 - 23 mg/dL   Creatinine, Ser 1.611.18 (H) 0.50 - 1.10 mg/dL   Calcium 09.610.3 8.4 - 04.510.5 mg/dL   Total Protein 7.6 6.0 - 8.3 g/dL   Albumin 4.3 3.5 - 5.2 g/dL   AST 21 0 - 37 U/L   ALT 14 0 - 35 U/L   Alkaline Phosphatase 86 39 - 117 U/L   Total Bilirubin 0.4 0.3 - 1.2 mg/dL   GFR calc non Af Amer 45 (L) >90 mL/min   GFR calc Af Amer 52 (L) >90 mL/min   Anion gap 15 5 - 15  CBC with Differential  Result Value Ref Range   WBC 7.3 4.0 - 10.5 K/uL   RBC 4.56 3.87 - 5.11 MIL/uL   Hemoglobin 14.0 12.0 - 15.0 g/dL   HCT 40.940.5 81.136.0 - 91.446.0 %   MCV 88.8 78.0 - 100.0 fL   MCH 30.7 26.0 - 34.0 pg   MCHC 34.6 30.0 - 36.0 g/dL   RDW 78.213.7 95.611.5 - 21.315.5 %   Platelets 197 150 - 400 K/uL   Neutrophils Relative % 53 43 - 77 %   Neutro Abs 3.8 1.7 - 7.7 K/uL   Lymphocytes Relative 39 12 - 46 %   Lymphs Abs 2.9 0.7 - 4.0 K/uL   Monocytes Relative 7 3 - 12 %   Monocytes Absolute 0.5 0.1 - 1.0 K/uL   Eosinophils Relative 1 0 - 5 %   Eosinophils Absolute 0.1 0.0 - 0.7 K/uL   Basophils Relative 0 0 - 1 %   Basophils Absolute 0.0 0.0 - 0.1 K/uL  Lipase, blood  Result Value Ref Range   Lipase 32 11 - 59 U/L  Urinalysis, Routine w reflex microscopic  Result Value Ref Range   Color, Urine YELLOW YELLOW   APPearance CLEAR CLEAR   Specific Gravity, Urine 1.020 1.005 - 1.030   pH 5.5 5.0 - 8.0   Glucose, UA NEGATIVE NEGATIVE mg/dL   Hgb urine dipstick NEGATIVE  NEGATIVE   Bilirubin Urine NEGATIVE NEGATIVE   Ketones, ur NEGATIVE NEGATIVE mg/dL   Protein, ur NEGATIVE NEGATIVE mg/dL   Urobilinogen, UA  0.2 0.0 - 1.0 mg/dL   Nitrite NEGATIVE NEGATIVE   Leukocytes, UA TRACE (A) NEGATIVE  Urine microscopic-add on  Result Value Ref Range   Squamous Epithelial / LPF MANY (A) RARE   WBC, UA 0-2 <3 WBC/hpf   Bacteria, UA MANY (A) RARE   Casts HYALINE CASTS (A) NEGATIVE   No results found.  Labs Review Labs Reviewed  COMPREHENSIVE METABOLIC PANEL - Abnormal; Notable for the following:    Glucose, Bld 189 (*)    BUN 29 (*)    Creatinine, Ser 1.18 (*)    GFR calc non Af Amer 45 (*)    GFR calc Af Amer 52 (*)    All other components within normal limits  URINALYSIS, ROUTINE W REFLEX MICROSCOPIC - Abnormal; Notable for the following:    Leukocytes, UA TRACE (*)    All other components within normal limits  URINE MICROSCOPIC-ADD ON - Abnormal; Notable for the following:    Squamous Epithelial / LPF MANY (*)    Bacteria, UA MANY (*)    Casts HYALINE CASTS (*)    All other components within normal limits  URINE CULTURE  CBC WITH DIFFERENTIAL  LIPASE, BLOOD    Imaging Review No results found.   EKG Interpretation None      MDM   Final diagnoses:  Pain of upper abdomen  IBS (irritable bowel syndrome)    Patient with history of irritable bowel syndrome. Some pain in upper abdomen. Levsin has not helped. Lab work reassuring. Creatinine mildly elevated though. Urinalysis showed some bacteria without white cells. Culture sent. Patient feels better as tolerated orals he'll be discharged home I personally performed the services described in this documentation, which was scribed in my presence. The recorded information has been reviewed and is accurate.    Juliet Rude. Sparks Payor, MD 10/14/14 [phone]

## 2014-10-13 NOTE — ED Notes (Signed)
Pt given water to drink. 

## 2014-10-14 NOTE — ED Notes (Signed)
Pt alert & oriented x4, stable gait. Patient  given discharge instructions, paperwork & prescription(s). Patient verbalized understanding. Pt left department w/ no further questions. 

## 2014-10-15 LAB — URINE CULTURE: Colony Count: 50000

## 2015-01-09 ENCOUNTER — Other Ambulatory Visit (HOSPITAL_COMMUNITY): Payer: Self-pay | Admitting: Family Medicine

## 2015-01-09 DIAGNOSIS — R1013 Epigastric pain: Secondary | ICD-10-CM

## 2015-01-13 ENCOUNTER — Ambulatory Visit (HOSPITAL_COMMUNITY): Admission: RE | Admit: 2015-01-13 | Payer: Medicare Other | Source: Ambulatory Visit

## 2015-01-14 ENCOUNTER — Ambulatory Visit (HOSPITAL_COMMUNITY)
Admission: RE | Admit: 2015-01-14 | Discharge: 2015-01-14 | Disposition: A | Discharge status: Home / Self Care | Payer: Medicare Other | Source: Ambulatory Visit | Attending: Family Medicine | Admitting: Family Medicine

## 2015-01-14 DIAGNOSIS — R1013 Epigastric pain: Secondary | ICD-10-CM

## 2015-01-14 DIAGNOSIS — R932 Abnormal findings on diagnostic imaging of liver and biliary tract: Secondary | ICD-10-CM | POA: Diagnosis not present

## 2015-02-10 ENCOUNTER — Other Ambulatory Visit (HOSPITAL_COMMUNITY): Payer: Self-pay | Admitting: Family Medicine

## 2015-02-10 DIAGNOSIS — R1013 Epigastric pain: Secondary | ICD-10-CM

## 2015-02-12 ENCOUNTER — Encounter (HOSPITAL_COMMUNITY)
Admission: RE | Admit: 2015-02-12 | Discharge: 2015-02-12 | Disposition: A | Discharge status: Home / Self Care | Payer: Medicare Other | Source: Ambulatory Visit | Attending: Family Medicine | Admitting: Family Medicine

## 2015-02-12 DIAGNOSIS — R1013 Epigastric pain: Secondary | ICD-10-CM

## 2015-02-13 ENCOUNTER — Encounter (HOSPITAL_COMMUNITY): Payer: Medicare Other

## 2015-02-20 ENCOUNTER — Encounter (HOSPITAL_COMMUNITY)
Admission: RE | Admit: 2015-02-20 | Discharge: 2015-02-20 | Disposition: A | Discharge status: Home / Self Care | Payer: Medicare Other | Source: Ambulatory Visit | Attending: Family Medicine | Admitting: Family Medicine

## 2015-02-20 ENCOUNTER — Encounter (HOSPITAL_COMMUNITY): Payer: Self-pay

## 2015-02-20 DIAGNOSIS — R1013 Epigastric pain: Secondary | ICD-10-CM | POA: Diagnosis present

## 2015-02-20 MED ORDER — SINCALIDE 5 MCG IJ SOLR
INTRAMUSCULAR | Status: AC
  Administered 2015-02-20: 1.43 ug via INTRAVENOUS
  Filled 2015-02-20: qty 5

## 2015-02-20 MED ORDER — TECHNETIUM TC 99M MEBROFENIN IV KIT
5.0000 | PACK | Freq: Once | INTRAVENOUS | Status: AC | PRN
  Administered 2015-02-20: 5 via INTRAVENOUS

## 2015-02-20 MED ORDER — STERILE WATER FOR INJECTION IJ SOLN
INTRAMUSCULAR | Status: AC
  Administered 2015-02-20: 1.43 mL via INTRAVENOUS
  Filled 2015-02-20: qty 10

## 2015-02-20 MED ORDER — SODIUM CHLORIDE 0.9 % IJ SOLN
INTRAMUSCULAR | Status: AC
  Filled 2015-02-20: qty 18

## 2015-04-06 ENCOUNTER — Other Ambulatory Visit (HOSPITAL_COMMUNITY): Payer: Self-pay | Admitting: Family Medicine

## 2015-04-06 ENCOUNTER — Ambulatory Visit (HOSPITAL_COMMUNITY)
Admission: RE | Admit: 2015-04-06 | Discharge: 2015-04-06 | Disposition: A | Discharge status: Home / Self Care | Payer: Medicare Other | Source: Ambulatory Visit | Attending: Family Medicine | Admitting: Family Medicine

## 2015-04-06 DIAGNOSIS — M79605 Pain in left leg: Secondary | ICD-10-CM | POA: Insufficient documentation

## 2015-04-06 DIAGNOSIS — R252 Cramp and spasm: Secondary | ICD-10-CM

## 2015-04-06 DIAGNOSIS — M79604 Pain in right leg: Secondary | ICD-10-CM | POA: Diagnosis not present

## 2015-05-12 ENCOUNTER — Ambulatory Visit (HOSPITAL_COMMUNITY)
Admission: RE | Admit: 2015-05-12 | Discharge: 2015-05-12 | Disposition: A | Discharge status: Home / Self Care | Payer: Medicare Other | Source: Ambulatory Visit | Attending: Family Medicine | Admitting: Family Medicine

## 2015-05-12 ENCOUNTER — Other Ambulatory Visit (HOSPITAL_COMMUNITY): Payer: Self-pay | Admitting: Family Medicine

## 2015-05-12 DIAGNOSIS — M5431 Sciatica, right side: Secondary | ICD-10-CM | POA: Diagnosis present

## 2015-06-13 ENCOUNTER — Encounter (HOSPITAL_COMMUNITY): Payer: Self-pay | Admitting: Emergency Medicine

## 2015-06-13 ENCOUNTER — Inpatient Hospital Stay (HOSPITAL_COMMUNITY)
Admission: EM | Admit: 2015-06-13 | Discharge: 2015-06-16 | DRG: 066 | Disposition: A | Discharge status: Rehabilitation Facility | Payer: Medicare Other | Attending: Internal Medicine | Admitting: Internal Medicine

## 2015-06-13 ENCOUNTER — Inpatient Hospital Stay (HOSPITAL_COMMUNITY): Payer: Medicare Other

## 2015-06-13 ENCOUNTER — Emergency Department (HOSPITAL_COMMUNITY): Payer: Medicare Other

## 2015-06-13 DIAGNOSIS — I10 Essential (primary) hypertension: Secondary | ICD-10-CM | POA: Diagnosis present

## 2015-06-13 DIAGNOSIS — I6789 Other cerebrovascular disease: Secondary | ICD-10-CM | POA: Diagnosis not present

## 2015-06-13 DIAGNOSIS — R7989 Other specified abnormal findings of blood chemistry: Secondary | ICD-10-CM | POA: Diagnosis present

## 2015-06-13 DIAGNOSIS — E785 Hyperlipidemia, unspecified: Secondary | ICD-10-CM | POA: Diagnosis present

## 2015-06-13 DIAGNOSIS — Z7982 Long term (current) use of aspirin: Secondary | ICD-10-CM

## 2015-06-13 DIAGNOSIS — R748 Abnormal levels of other serum enzymes: Secondary | ICD-10-CM

## 2015-06-13 DIAGNOSIS — G8190 Hemiplegia, unspecified affecting unspecified side: Secondary | ICD-10-CM | POA: Diagnosis not present

## 2015-06-13 DIAGNOSIS — R4701 Aphasia: Secondary | ICD-10-CM | POA: Diagnosis present

## 2015-06-13 DIAGNOSIS — E1159 Type 2 diabetes mellitus with other circulatory complications: Secondary | ICD-10-CM | POA: Diagnosis not present

## 2015-06-13 DIAGNOSIS — Z79899 Other long term (current) drug therapy: Secondary | ICD-10-CM

## 2015-06-13 DIAGNOSIS — R1312 Dysphagia, oropharyngeal phase: Secondary | ICD-10-CM | POA: Diagnosis not present

## 2015-06-13 DIAGNOSIS — R944 Abnormal results of kidney function studies: Secondary | ICD-10-CM | POA: Diagnosis present

## 2015-06-13 DIAGNOSIS — E1165 Type 2 diabetes mellitus with hyperglycemia: Secondary | ICD-10-CM | POA: Diagnosis present

## 2015-06-13 DIAGNOSIS — E1142 Type 2 diabetes mellitus with diabetic polyneuropathy: Secondary | ICD-10-CM | POA: Diagnosis present

## 2015-06-13 DIAGNOSIS — E7800 Pure hypercholesterolemia, unspecified: Secondary | ICD-10-CM | POA: Diagnosis present

## 2015-06-13 DIAGNOSIS — E119 Type 2 diabetes mellitus without complications: Secondary | ICD-10-CM | POA: Diagnosis present

## 2015-06-13 DIAGNOSIS — I480 Paroxysmal atrial fibrillation: Secondary | ICD-10-CM | POA: Diagnosis present

## 2015-06-13 DIAGNOSIS — I63412 Cerebral infarction due to embolism of left middle cerebral artery: Secondary | ICD-10-CM

## 2015-06-13 DIAGNOSIS — E089 Diabetes mellitus due to underlying condition without complications: Secondary | ICD-10-CM | POA: Diagnosis not present

## 2015-06-13 DIAGNOSIS — Z9181 History of falling: Secondary | ICD-10-CM

## 2015-06-13 DIAGNOSIS — I639 Cerebral infarction, unspecified: Secondary | ICD-10-CM | POA: Diagnosis present

## 2015-06-13 DIAGNOSIS — K589 Irritable bowel syndrome without diarrhea: Secondary | ICD-10-CM | POA: Diagnosis present

## 2015-06-13 DIAGNOSIS — R2990 Unspecified symptoms and signs involving the nervous system: Secondary | ICD-10-CM

## 2015-06-13 DIAGNOSIS — I6340 Cerebral infarction due to embolism of unspecified cerebral artery: Secondary | ICD-10-CM | POA: Diagnosis not present

## 2015-06-13 DIAGNOSIS — M5030 Other cervical disc degeneration, unspecified cervical region: Secondary | ICD-10-CM | POA: Diagnosis present

## 2015-06-13 DIAGNOSIS — I69320 Aphasia following cerebral infarction: Secondary | ICD-10-CM | POA: Diagnosis not present

## 2015-06-13 HISTORY — DX: Paroxysmal atrial fibrillation: I48.0

## 2015-06-13 LAB — I-STAT CHEM 8, ED
BUN: 25 mg/dL — ABNORMAL HIGH (ref 6–20)
Calcium, Ion: 1.17 mmol/L (ref 1.13–1.30)
Chloride: 102 mmol/L (ref 101–111)
Creatinine, Ser: 1.2 mg/dL — ABNORMAL HIGH (ref 0.44–1.00)
Glucose, Bld: 264 mg/dL — ABNORMAL HIGH (ref 65–99)
HEMATOCRIT: 42 % (ref 36.0–46.0)
HEMOGLOBIN: 14.3 g/dL (ref 12.0–15.0)
Potassium: 4.5 mmol/L (ref 3.5–5.1)
SODIUM: 138 mmol/L (ref 135–145)
TCO2: 21 mmol/L (ref 0–100)

## 2015-06-13 LAB — URINALYSIS, ROUTINE W REFLEX MICROSCOPIC
BILIRUBIN URINE: NEGATIVE
Bilirubin Urine: NEGATIVE
Glucose, UA: >1000 mg/dL — AB
Hgb urine dipstick: NEGATIVE
Hgb urine dipstick: NEGATIVE
Ketones, ur: 15 mg/dL — AB
Ketones, ur: 15 mg/dL — AB
LEUKOCYTES UA: NEGATIVE
NITRITE: NEGATIVE
Nitrite: NEGATIVE
PH: 5.5 (ref 5.0–8.0)
Protein, ur: NEGATIVE mg/dL
Protein, ur: NEGATIVE mg/dL
SPECIFIC GRAVITY, URINE: 1.023 (ref 1.005–1.030)
Specific Gravity, Urine: 1.026 (ref 1.005–1.030)
Urobilinogen, UA: 0.2 mg/dL (ref 0.0–1.0)
Urobilinogen, UA: 0.2 mg/dL (ref 0.0–1.0)
pH: 5 (ref 5.0–8.0)

## 2015-06-13 LAB — GLUCOSE, CAPILLARY
GLUCOSE-CAPILLARY: 179 mg/dL — AB (ref 65–99)
Glucose-Capillary: 200 mg/dL — ABNORMAL HIGH (ref 65–99)
Glucose-Capillary: 195 mg/dL — ABNORMAL HIGH (ref 65–99)

## 2015-06-13 LAB — COMPREHENSIVE METABOLIC PANEL
ALBUMIN: 3.5 g/dL (ref 3.5–5.0)
ALK PHOS: 71 U/L (ref 38–126)
ALT: 13 U/L — AB (ref 14–54)
AST: 22 U/L (ref 15–41)
Anion gap: 10 (ref 5–15)
BUN: 23 mg/dL — ABNORMAL HIGH (ref 6–20)
CHLORIDE: 103 mmol/L (ref 101–111)
CO2: 24 mmol/L (ref 22–32)
Calcium: 9.3 mg/dL (ref 8.9–10.3)
Creatinine, Ser: 1.47 mg/dL — ABNORMAL HIGH (ref 0.44–1.00)
GFR calc Af Amer: 40 mL/min — ABNORMAL LOW (ref >60)
GFR calc non Af Amer: 34 mL/min — ABNORMAL LOW (ref >60)
Glucose, Bld: 264 mg/dL — ABNORMAL HIGH (ref 65–99)
POTASSIUM: 4.6 mmol/L (ref 3.5–5.1)
SODIUM: 137 mmol/L (ref 135–145)
Total Bilirubin: 0.7 mg/dL (ref 0.3–1.2)
Total Protein: 6.1 g/dL — ABNORMAL LOW (ref 6.5–8.1)

## 2015-06-13 LAB — DIFFERENTIAL
Basophils Absolute: 0 10*3/uL (ref 0.0–0.1)
Basophils Relative: 0 % (ref 0–1)
Eosinophils Absolute: 0.1 10*3/uL (ref 0.0–0.7)
Eosinophils Relative: 1 % (ref 0–5)
Lymphocytes Relative: 19 % (ref 12–46)
Lymphs Abs: 1.4 10*3/uL (ref 0.7–4.0)
MONO ABS: 0.3 10*3/uL (ref 0.1–1.0)
MONOS PCT: 4 % (ref 3–12)
NEUTROS ABS: 5.6 10*3/uL (ref 1.7–7.7)
NEUTROS PCT: 76 % (ref 43–77)

## 2015-06-13 LAB — URINE MICROSCOPIC-ADD ON

## 2015-06-13 LAB — CBC
HCT: 39.8 % (ref 36.0–46.0)
Hemoglobin: 13.7 g/dL (ref 12.0–15.0)
MCH: 29 pg (ref 26.0–34.0)
MCHC: 34.4 g/dL (ref 30.0–36.0)
MCV: 84.3 fL (ref 78.0–100.0)
PLATELETS: 127 10*3/uL — AB (ref 150–400)
RBC: 4.72 MIL/uL (ref 3.87–5.11)
RDW: 13 % (ref 11.5–15.5)
WBC: 7.5 10*3/uL (ref 4.0–10.5)

## 2015-06-13 LAB — CBG MONITORING, ED: Glucose-Capillary: 238 mg/dL — ABNORMAL HIGH (ref 65–99)

## 2015-06-13 LAB — I-STAT TROPONIN, ED: Troponin i, poc: 0 ng/mL (ref 0.00–0.08)

## 2015-06-13 LAB — PROTIME-INR
INR: 1.12 (ref 0.00–1.49)
Prothrombin Time: 14.5 seconds (ref 11.6–15.2)

## 2015-06-13 LAB — RAPID URINE DRUG SCREEN, HOSP PERFORMED
Amphetamines: NOT DETECTED
Barbiturates: NOT DETECTED
Benzodiazepines: POSITIVE — AB
COCAINE: NOT DETECTED
OPIATES: NOT DETECTED
TETRAHYDROCANNABINOL: NOT DETECTED

## 2015-06-13 LAB — TROPONIN I: Troponin I: <0.03 ng/mL (ref <0.031)

## 2015-06-13 LAB — ETHANOL

## 2015-06-13 LAB — CK: Total CK: 50 U/L (ref 38–234)

## 2015-06-13 LAB — APTT: APTT: 28 s (ref 24–37)

## 2015-06-13 MED ORDER — ASPIRIN 300 MG RE SUPP: 300.0000 mg | Freq: Every day | RECTAL | Status: DC

## 2015-06-13 MED ORDER — SODIUM CHLORIDE 0.9 % IV SOLN
INTRAVENOUS | Status: AC
  Administered 2015-06-13: 18:00:00 via INTRAVENOUS

## 2015-06-13 MED ORDER — HEPARIN SODIUM (PORCINE) 5000 UNIT/ML IJ SOLN
5000.0000 [IU] | Freq: Three times a day (TID) | INTRAMUSCULAR | Status: DC
  Administered 2015-06-13 – 2015-06-14 (×3): 5000 [IU] via SUBCUTANEOUS
  Filled 2015-06-13 (×3): qty 1

## 2015-06-13 MED ORDER — ASPIRIN 325 MG PO TABS
325.0000 mg | ORAL_TABLET | Freq: Every day | ORAL | Status: DC
  Administered 2015-06-13 – 2015-06-14 (×2): 325 mg via ORAL
  Filled 2015-06-13 (×2): qty 1

## 2015-06-13 MED ORDER — ACETAMINOPHEN 650 MG RE SUPP: 650.0000 mg | RECTAL | Status: DC | PRN

## 2015-06-13 MED ORDER — LORAZEPAM 2 MG/ML IJ SOLN: 0.5000 mg | Freq: Two times a day (BID) | INTRAMUSCULAR | Status: DC | PRN

## 2015-06-13 MED ORDER — ACETAMINOPHEN 325 MG PO TABS
650.0000 mg | ORAL_TABLET | ORAL | Status: DC | PRN
  Administered 2015-06-14: 650 mg via ORAL
  Filled 2015-06-13: qty 2

## 2015-06-13 MED ORDER — INSULIN GLARGINE 100 UNIT/ML ~~LOC~~ SOLN
10.0000 [IU] | Freq: Every day | SUBCUTANEOUS | Status: DC
  Administered 2015-06-13 – 2015-06-15 (×3): 10 [IU] via SUBCUTANEOUS
  Filled 2015-06-13 (×4): qty 0.1

## 2015-06-13 MED ORDER — STROKE: EARLY STAGES OF RECOVERY BOOK
Freq: Once | Status: AC
  Administered 2015-06-13: 16:00:00

## 2015-06-13 MED ORDER — HYDRALAZINE HCL 20 MG/ML IJ SOLN: 5.0000 mg | Freq: Four times a day (QID) | INTRAMUSCULAR | Status: DC | PRN

## 2015-06-13 MED ORDER — INSULIN ASPART 100 UNIT/ML ~~LOC~~ SOLN
0.0000 [IU] | SUBCUTANEOUS | Status: DC
  Administered 2015-06-13 – 2015-06-14 (×3): 2 [IU] via SUBCUTANEOUS
  Administered 2015-06-14: 1 [IU] via SUBCUTANEOUS
  Administered 2015-06-14 (×2): 2 [IU] via SUBCUTANEOUS
  Administered 2015-06-14 – 2015-06-15 (×5): 1 [IU] via SUBCUTANEOUS
  Administered 2015-06-15: 3 [IU] via SUBCUTANEOUS
  Administered 2015-06-16 (×2): 2 [IU] via SUBCUTANEOUS

## 2015-06-13 MED ORDER — RESOURCE THICKENUP CLEAR PO POWD
ORAL | Status: DC | PRN
  Filled 2015-06-13: qty 125

## 2015-06-13 NOTE — Consult Note (Signed)
Referring Physician: Dr Janee Morn    Chief Complaint: Speech difficulties  HPI: Holly Sparks is a 74 y.o. female with a history of diabetes mellitus, hypertension and hyperlipidemia who takes aspirin 81 mg on a daily basis. She was at a church retreat Friday night and was last seen well at approximately 10 PM. Today an approximate 7:30 AM she was found to have aphasia with inability to follow most commands. She was brought to the emergency room where a CT scan of the head was negative for acute findings. She has been admitted for a full stroke workup.  A review of her old records revealed an admission 09/21/2003 at [facility] ([place]) for palpitations. She was noted to have acute onset of atrial fibrillation which later spontaneously converted to sinus rhythm. She was evaluated by [facility] and placed on diltiazem, metoprolol and aspirin 325 mg daily .The family was not aware of any history of irregular rhythms. An EKG at the time of this admission showed sinus rhythm.  Date last known well: Date: 06/12/2015 Time last known well: Unable to determine tPA Given: No: Late presentation and unknown time of onset  Past Medical History  Diagnosis Date  . Hypertension     x 10 yrs  . 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 High cholesterol     Past Surgical History  Procedure Laterality Date  . Neck surgery      x 2 for spur and disc problems  . Colonoscopy    . Colonoscopy N/A 05/08/2013    Procedure: COLONOSCOPY;  Surgeon: Malissa Hippo, MD;  [facility];  Service: Endoscopy;  Laterality: N/A;  1015    Family history: No family history of strokes.   Social History:  reports that she has never smoked. She does not have any smokeless tobacco history on file. She reports that she does not drink alcohol or use illicit drugs. Married with 3 children. Family very supportive.   Allergies: No Known Allergies   Medications:  Scheduled: .  stroke: mapping our early  stages of recovery book   Does not apply Once  . aspirin  300 mg Rectal Daily   Or  . aspirin  325 mg Oral Daily  . heparin  5,000 Units Subcutaneous 3 times per day  . insulin aspart  0-9 Units Subcutaneous 6 times per day  . insulin glargine  10 Units Subcutaneous QHS    ROS: The patient is unable to provide a review of systems secondary to aphasia. The patient's family reports that she has complained of some recent fatigue.   Physical Examination: Blood pressure 169/73, pulse 53, temperature 97.9 F (36.6 C), temperature source Oral, resp. rate 18, height  (1.676 m), weight 71.215 kg (157 lb), SpO2 99 %.  General - pleasant but somewhat lethargic 74 year old female in no acute distress HENT: no op obstruction.  Eyes: no scleral injection Heart - Regular rate and rhythm - no murmer noted Lungs - Clear to auscultation anteriorly poor inspiratory effort Abdomen - Soft - non tender Extremities - Distal pulses intact - no edema Skin - Warm and dry  Neuro: Mental Status: The patient has significant aphasia. She is not able to answer any questions. She occasionally perseverates on 4 phrases. She follows occasional simple commands but inconsistently.  Cranial Nerves: II: Discs not visualized; Visual fields unable to test.  pupils equal, round, reactive to light and accommodation III,IV, VI: ptosis not present, extra-ocular motions  intact bilaterally V,VII: Lower right facial droop. Sensation could not be tested VIII: hearing normal bilaterally IX,X: gag reflex present XI: Unable to test XII: m unable to test Motor: Muscle testing could not be accomplished. She was able to all extremities against gravity if positioned. I do suspect a mild right hemipareiss.  Tone and bulk:normal tone throughout; no atrophy noted Sensory: Unable to test. Deep Tendon Reflexes: 2+ and symmetric throughout Plantars: Right: downgoing   Left: downgoing Cerebellar: Unable to test Gait: normal  gait and station CV: pulses palpable throughout      Laboratory Studies:  Basic Metabolic Panel:  Recent Labs Lab 06/13/15 1026 06/13/15 1035  NA 137 138  K 4.6 4.5  CL 103 102  CO2 24  --   GLUCOSE 264* 264*  BUN 23* 25*  CREATININE 1.47* 1.20*  CALCIUM 9.3  --     Liver Function Tests:  Recent Labs Lab 06/13/15 1026  AST 22  ALT 13*  ALKPHOS 71  BILITOT 0.7  PROT 6.1*  ALBUMIN 3.5   No results for input(s): LIPASE, AMYLASE in the last 168 hours. No results for input(s): AMMONIA in the last 168 hours.  CBC:  Recent Labs Lab 06/13/15 1026 06/13/15 1035  WBC 7.5  --   NEUTROABS 5.6  --   HGB 13.7 14.3  HCT 39.8 42.0  MCV 84.3  --   PLT 127*  --     Cardiac Enzymes:  Recent Labs Lab 06/13/15 1026  CKTOTAL 50    BNP: Invalid input(s): POCBNP  CBG:  Recent Labs Lab 06/13/15 1018  GLUCAP 238*    Microbiology: Results for orders placed or performed during the hospital encounter of 10/13/14  Urine culture     Status: None   Collection Time: 10/13/14 10:55 PM  Result Value Ref Range Status   Specimen Description URINE, CLEAN CATCH  Final   Special Requests NONE  Final   Culture  Setup Time   Final    10/14/2014 14:47 Performed at [facility] Count   Final    50,000 COLONIES/ML Performed at [facility]    Culture   Final    Multiple bacterial morphotypes present, none predominant. Suggest appropriate recollection if clinically indicated. Performed at [facility]    Report Status 10/15/2014 FINAL  Final    Coagulation Studies:  Recent Labs  06/13/15 1026  LABPROT 14.5  INR 1.12    Urinalysis:  Recent Labs Lab 06/13/15 1035 06/13/15 1141  COLORURINE YELLOW YELLOW  LABSPEC 1.026 1.023  PHURINE 5.0 5.5  GLUCOSEU >1000* >1000*  HGBUR NEGATIVE NEGATIVE  BILIRUBINUR NEGATIVE NEGATIVE  KETONESUR 15* 15*  PROTEINUR NEGATIVE NEGATIVE  UROBILINOGEN 0.2 0.2  NITRITE NEGATIVE NEGATIVE   LEUKOCYTESUR SMALL* NEGATIVE    Lipid Panel:    Component Value Date/Time   CHOL  12/11/2009 0410    130        ATP III CLASSIFICATION:  <200     mg/dL   Desirable  621-308  mg/dL   Borderline High  >=657    mg/dL   High          TRIG 846 12/11/2009 0410   HDL 36* 12/11/2009 0410   CHOLHDL 3.6 12/11/2009 0410   VLDL 25 12/11/2009 0410   LDLCALC  12/11/2009 0410    69        Total Cholesterol/HDL:CHD Risk Coronary Heart Disease Risk Table  Men   Women  1/2 Average Risk   3.4   3.3  Average Risk       5.0   4.4  2 X Average Risk   9.6   7.1  3 X Average Risk  23.4   11.0        Use the calculated Patient Ratio above and the CHD Risk Table to determine the patient's CHD Risk.        ATP III CLASSIFICATION (LDL):  <100     mg/dL   Optimal  161-096100-129  mg/dL   Near or Above                    Optimal  130-159  mg/dL   Borderline  [id number]  mg/dL   High  >811>190     mg/dL   Very High    [id number]:  Lab Results  Component Value Date   HGBA1C * 12/10/2009    7.6 (NOTE) The ADA recommends the following therapeutic goal for glycemic control related to Hgb A1c measurement: Goal of therapy: <6.5 Hgb A1c  Reference: American Diabetes Association: Clinical Practice Recommendations 2010, Diabetes Care, 2010, 33: (Suppl  1).    Urine Drug Screen:     Component Value Date/Time   LABOPIA NONE DETECTED 06/13/2015 1035   COCAINSCRNUR NONE DETECTED 06/13/2015 1035   LABBENZ POSITIVE* 06/13/2015 1035   AMPHETMU NONE DETECTED 06/13/2015 1035   THCU NONE DETECTED 06/13/2015 1035   LABBARB NONE DETECTED 06/13/2015 1035    Alcohol Level:  Recent Labs Lab 06/13/15 1026  ETH <5    Other results: EKG: Sinus rhythm rate 55 bpm with LVH. Please refer to the formal cardiology reading for complete details.    Imaging:  Ct Head Wo Contrast 06/13/2015    1. No acute intracranial abnormalities.  2. Brain atrophy and mild chronic microvascular disease.      MRI/ MRA  Of brain and head without contrast pending     Assessment: 74 y.o. female with history of diabetes mellitus, hypertension, and hyperlipidemia as well as a remote history of atrial fibrillation not on anticoagulation presents with significant aphasia. Head CT negative. MRI/MRA is pending. The patient will need to be admitted for a full stroke workup. The patient has a history of falls in the past. A decision is made to anticoagulate patient would most likely want to wait at least 7 days to prevent hemorrhagic transformation.  Stroke Risk Factors - atrial fibrillation, diabetes mellitus, hyperlipidemia and hypertension  Plan: 1. HgbA1c, fasting lipid panel 2. MRI, MRA  of the brain without contrast 3. PT consult, OT consult, Speech consult 4. Echocardiogram 5. Carotid dopplers 6. Prophylactic therapy-Antiplatelet med: Aspirin - dose 325 mg daily  7. NPO until RN stroke swallow screen 8. Telemetry monitoring 9. Frequent neuro checks  Hassel NethDavid Rinehuls PA-C [facility] Pager [phone] 06/13/2015, 2:42 PM   I have seen and evaluated the patient. I have reviewed the above note and made appropriate changes.   74 yo F with likely embolus that broke up in the distribution of the left MCA. She has had a single episode of atrial fibrillation in the past. She will need to be evaluated for further modifiable risk factors as outlined above.  Ritta SlotMcNeill Fin Hupp, MD [facility] [phone]  If 7pm- 7am, please page neurology on call as listed in AMION.

## 2015-06-13 NOTE — ED Notes (Addendum)
Dr. Wickline at bedside.  

## 2015-06-13 NOTE — H&P (Signed)
[facility] History and Physical                                                                                    Holly Sparks, is a 74 y.o. female  MRN: [id number]   DOB - Dec 23, 1940  Admit Date - 06/13/2015  Outpatient Primary MD for the patient is Milana Obey, MD  Referring Physician:  Celene Skeen, PA   Chief Complaint:   Chief Complaint  Patient presents with  . Cerebrovascular Accident     HPI  Holly Sparks  is a 74 y.o. female, with hypertension, hyperlipidemia, diabetes and recently an irregular heartbeat. She presents to the ER this morning after having difficulty speaking or following commands. The patient is unable to provide a history due to aphasia.  History is gathered from the ER PA, the patient's minister, and her two sons.  The patient was at a church retreat in [place]. She had been feeling hot and tired since Wednesday 7/13. She was last seen normal by her roommate at 10 PM last evening when she went to bed. She had been feeling hot and tired during the evening. This morning at approximately 7:30 AM the roommate heard the patient moaning and went to check on her. The patient was unable to speak clearly or follow commands.  According to her son Holly Sparks, the patient has been told she had an irregular heartbeat recently. However, he does not believe she was placed on any type of anticoagulant medication for this.   Review of Systems   The patient is unable to give history or review of systems.  Past Medical History  Past Medical History  Diagnosis Date  . Hypertension     x 10 yrs  . 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 High cholesterol     Past Surgical History  Procedure Laterality Date  . Neck surgery      x 2 for spur and disc problems  . Colonoscopy    . Colonoscopy N/A 05/08/2013    Procedure: COLONOSCOPY;  Surgeon: Malissa Hippo, MD;  Location: [facility];  Service: Endoscopy;  Laterality: N/A;  1015      Social History History   Substance Use Topics  . Smoking status: Never Smoker   . Smokeless tobacco: Not on file  . Alcohol Use: No    Family History Per son Holly Sparks, there is no known family history of stroke  Prior to Admission medications   Medication Sig Start Date End Date Taking? Authorizing Provider  aspirin 81 MG tablet Take 81 mg by mouth daily.    Historical Provider, MD  aspirin EC 81 MG tablet Take 81 mg by mouth daily.    Historical Provider, MD  atorvastatin (LIPITOR) 10 MG tablet Take 10 mg by mouth daily.    Historical Provider, MD  Cholecalciferol (VITAMIN D) 2000 UNITS CAPS Take 1 capsule by mouth daily.    Historical Provider, MD  hyoscyamine (LEVSIN SL) 0.125 MG SL tablet Place 1 tablet (0.125 mg total) under the tongue every 4 (four) hours as needed for cramping. 04/24/13   Len Blalock, NP  lisinopril-hydrochlorothiazide (  PRINZIDE,ZESTORETIC) 20-25 MG per tablet Take 1 tablet by mouth daily.    Historical Provider, MD  LORazepam (ATIVAN) 0.5 MG tablet Take 0.5 mg by mouth at bedtime. *May take up to 4 times daily as needed for anxiety*    Historical Provider, MD  metFORMIN (GLUCOPHAGE) 500 MG tablet Take 500 mg by mouth 2 (two) times daily with a meal.    Historical Provider, MD  metoprolol (LOPRESSOR) 50 MG tablet Take 50 mg by mouth daily.    Historical Provider, MD  traMADol (ULTRAM) 50 MG tablet Take 1 tablet (50 mg total) by mouth every 6 (six) hours as needed. 10/13/14   Benjiman Core, MD    No Known Allergies  Physical Exam  Vitals  Blood pressure 171/75, pulse 54, temperature 97.9 F (36.6 C), temperature source Oral, resp. rate 17, height 5\' 6"  (1.676 m), weight 71.215 kg (157 lb), SpO2 100 %.   General:  Well-developed female lying in bed in NAD, pleasant. Sons at bedside  Psych: Patient is calm but unable to use words to express herself. She also appears unable to understand my questions. She will respond with head movement or a simple ""no". She is unable to speak in  sentences  Neuro:   Patient follows a few of my commands. She has facial droop, she is unable to speak in phrases or clearly enunciate words. When her sons ask her to identify them she calls them [place]. She is able to move her upper extremities and has symmetric strength. She does not move her lower extremities on command.  ENT:  Ears and Eyes appear Normal, Conjunctivae clear, PER. Moist oral mucosa without erythema or exudates.  Neck:  Supple, No lymphadenopathy or thyromegaly appreciated  Respiratory:  Symmetrical chest wall movement, Good air movement bilaterally, CTAB. Patient does not follow commands to inspire deeply.  Cardiac:  RRR, No Murmurs, no LE edema noted, no JVD.    Abdomen:  Positive bowel sounds, Soft, Non tender, Non distended,  No masses appreciated  Skin:  No Cyanosis, Normal Skin Turgor, No Skin Rash or Bruise.  Extremities:  No effusions.  does not move lower extremities on command.  Data Review  CBC  Recent Labs Lab 06/13/15 1026 06/13/15 1035  WBC 7.5  --   HGB 13.7 14.3  HCT 39.8 42.0  PLT 127*  --   MCV 84.3  --   MCH 29.0  --   MCHC 34.4  --   RDW 13.0  --   LYMPHSABS 1.4  --   MONOABS 0.3  --   EOSABS 0.1  --   BASOSABS 0.0  --     Chemistries   Recent Labs Lab 06/13/15 1026 06/13/15 1035  NA 137 138  K 4.6 4.5  CL 103 102  CO2 24  --   GLUCOSE 264* 264*  BUN 23* 25*  CREATININE 1.47* 1.20*  CALCIUM 9.3  --   AST 22  --   ALT 13*  --   ALKPHOS 71  --   BILITOT 0.7  --     Coagulation profile  Recent Labs Lab 06/13/15 1026  INR 1.12     Urinalysis:  Initial urine had squamous cells. Have requested a repeat urine.    Component Value Date/Time   COLORURINE YELLOW 06/13/2015 1035   APPEARANCEUR CLOUDY* 06/13/2015 1035   LABSPEC 1.026 06/13/2015 1035   PHURINE 5.0 06/13/2015 1035   GLUCOSEU >1000* 06/13/2015 1035   HGBUR NEGATIVE 06/13/2015 1035   BILIRUBINUR  NEGATIVE 06/13/2015 1035   KETONESUR 15* 06/13/2015  1035   PROTEINUR NEGATIVE 06/13/2015 1035   UROBILINOGEN 0.2 06/13/2015 1035   NITRITE NEGATIVE 06/13/2015 1035   LEUKOCYTESUR SMALL* 06/13/2015 1035    Imaging results:   Ct Head Wo Contrast  06/13/2015   CLINICAL DATA:  Found unresponsive.  EXAM: CT HEAD WITHOUT CONTRAST  TECHNIQUE: Contiguous axial images were obtained from the base of the skull through the vertex without intravenous contrast.  COMPARISON:  12/11/2009  FINDINGS: Prominence of the sulci and ventricles identified consistent with brain atrophy. There is mild low attenuation within the subcortical and periventricular white matter. There is no acute brain infarct, intracranial hemorrhage or mass identified. The paranasal stress set there is partial opacification of the sphenoid sinus. The remaining paranasal sinuses are clear. The calvarium appears to be intact.  IMPRESSION: 1. No acute intracranial abnormalities. 2. Brain atrophy and mild chronic microvascular disease.   Electronically Signed   By: Signa Kell M.D.   On: 06/13/2015 11:13    My personal review of EKG: Sinus rhythm, LVH, flipped T's in V3 V4 1, 2, aVL   Assessment & Plan  Principal Problem:   Stroke-like symptoms Active Problems:   IBS (irritable bowel syndrome)   High cholesterol   Diabetes   HLD (hyperlipidemia)   DDD (degenerative disc disease), cervical   Elevated serum creatinine  Aphasia with inability to follow commands/probable acute CVA  Presumed CVA.  Risk factors of hypertension, diabetes, hyperlipidemia and possible history of irregular heartbeat  Last seen normal at 10 PM 7/15.  Symptoms noted at 7:30 AM by roommate - outside of the TPA window. Consequently TPA was not given.    Will admit to neuro telemetry. CT negative for acute stroke. MRI/MRA pending. Will complete stroke workup including carotid Dopplers, 2-D echo, PT/OT/SLP, check fasting lipids and hemoglobin A1c.      Neurology has been consulted.    Patient was on an 81 mg  aspirin daily.  We will start 325 mg aspirin and await neuro's recommendations.    Patient will be nothing by mouth pending speech evaluation.  Hypertension  Oral medications are being held.   We will allow for permissive hypertension up to 210 systolic or 110 diastolic today due to presumed stroke.    Have ordered hydralazine as needed.  Diabetes  CBG is elevated. Checking hemoglobin A1c.    Patient was on metformin. We will hold oral hypoglycemic agents.  We'll start Lantus and NovoLog with CBGs every 4 hours  Hyperlipidemia  Patient was on atorvastatin at home.   This will be held as she is nothing by mouth.  Checking fasting lipid panel.  Elevated serum creatinine. (Mild)  Uncertain if this is actually early chronic kidney disease.  Likely prerenal in nature in the setting of ACE inhibitor and diuretic.    Patient appears a bit dry will give gentle IV hydration.  Holding Zestoretic.  EKG changes  T-wave inversions in leads 1, aVL, V4, V5, V3  Cycle enzymes.   Checking 2-D echo. Aspirin 325 mg daily started.    Will continue statin if she passes a swallow eval.   Consultants Called:  Neurology  Family Communication:   Sons, daughter and Husband at bedside  Code Status:  Full code  Condition:  Guarded  Potential Disposition: Admit to inpatient.  May need SNF at D/C   Time spent in minutes : [address],  PA-C on 06/13/2015 at 12:10 PM Between 7am  to 7pm - Pager - [phone] After 7pm go to [email or web address] - password [email or web address] And look for the night coverage person covering me after hours  [facility]

## 2015-06-13 NOTE — ED Notes (Signed)
Internal medicine at bedside

## 2015-06-13 NOTE — ED Provider Notes (Signed)
CSN: [id number]     Arrival date & time 06/13/15  1008 History   First MD Initiated Contact with Patient 06/13/15 1010     Chief Complaint  Patient presents with  . Cerebrovascular Accident     (Consider location/radiation/quality/duration/timing/severity/associated sxs/prior Treatment) HPI Comments: 74 year old female with a past medical history of hypertension, diabetes and high cholesterol presenting via EMS from a church retreat in [place] after being found unresponsive by her retreat roommate at 9:30 AM today. Last seen normal was 2200 yesterday. Husband and son state the patient was completely normal prior to leaving for the retreat and has never seemed altered like this. On EMS arrival, patient was arousable, unable to follow commands, garbled speech and drooling. CBG 286 on EMS. No history of stroke. Level V caveat secondary to altered mental status.  The history is provided by the EMS personnel.    Past Medical History  Diagnosis Date  . Hypertension     x 10 yrs  . 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 High cholesterol    Past Surgical History  Procedure Laterality Date  . Neck surgery      x 2 for spur and disc problems  . Colonoscopy    . Colonoscopy N/A 05/08/2013    Procedure: COLONOSCOPY;  Surgeon: Malissa HippoNajeeb U Rehman, MD;  [facility]: [facility];  Service: Endoscopy;  Laterality: N/A;  1015   History reviewed. No pertinent family history. History  Substance Use Topics  . Smoking status: Never Smoker   . Smokeless tobacco: Not on file  . Alcohol Use: No   OB History    No data available     Review of Systems  Unable to perform ROS: Mental status change      Allergies  Review of patient's allergies indicates no known allergies.  Home Medications   Prior to Admission medications   Medication Sig Start Date End Date Taking? Authorizing Provider  aspirin 81 MG tablet Take 81 mg by mouth daily.    Historical Provider, MD  aspirin EC 81 MG tablet Take 81 mg  by mouth daily.    Historical Provider, MD  atorvastatin (LIPITOR) 10 MG tablet Take 10 mg by mouth daily.    Historical Provider, MD  Cholecalciferol (VITAMIN D) 2000 UNITS CAPS Take 1 capsule by mouth daily.    Historical Provider, MD  hyoscyamine (LEVSIN SL) 0.125 MG SL tablet Place 1 tablet (0.125 mg total) under the tongue every 4 (four) hours as needed for cramping. 04/24/13   Len Blalockerri L Setzer, NP  lisinopril-hydrochlorothiazide (PRINZIDE,ZESTORETIC) 20-25 MG per tablet Take 1 tablet by mouth daily.    Historical Provider, MD  LORazepam (ATIVAN) 0.5 MG tablet Take 0.5 mg by mouth at bedtime. *May take up to 4 times daily as needed for anxiety*    Historical Provider, MD  metFORMIN (GLUCOPHAGE) 500 MG tablet Take 500 mg by mouth 2 (two) times daily with a meal.    Historical Provider, MD  metoprolol (LOPRESSOR) 50 MG tablet Take 50 mg by mouth daily.    Historical Provider, MD  traMADol (ULTRAM) 50 MG tablet Take 1 tablet (50 mg total) by mouth every 6 (six) hours as needed. 10/13/14   Benjiman CoreNathan Pickering, MD   BP 159/77 mmHg  Pulse 54  Temp(Src) 97.9 F (36.6 C) (Oral)  Resp 21  Ht 5\' 6"  (1.676 m)  Wt 157 lb (71.215 kg)  BMI 25.35 kg/m2  SpO2 98% Physical Exam  Constitutional: She  appears well-developed and well-nourished.  Non-toxic appearance.  HENT:  Head: Normocephalic and atraumatic.  Mouth/Throat: Oropharynx is clear and moist.  Eyes: Conjunctivae and EOM are normal. Pupils are equal, round, and reactive to light.  Neck: Neck supple.  Cardiovascular: Normal rate, regular rhythm and normal pulses.   Pulmonary/Chest: Effort normal and breath sounds normal.  Abdominal: Normal appearance and bowel sounds are normal. She exhibits no distension. There is no tenderness.  Neurological: She is alert.  Expressive aphasia. Difficulty following commands. Confused. R pronator drift.  Nursing note and vitals reviewed.   ED Course  Procedures (including critical care time) Labs  Review Labs Reviewed  CBC - Abnormal; Notable for the following:    Platelets 127 (*)    All other components within normal limits  COMPREHENSIVE METABOLIC PANEL - Abnormal; Notable for the following:    Glucose, Bld 264 (*)    BUN 23 (*)    Creatinine, Ser 1.47 (*)    Total Protein 6.1 (*)    ALT 13 (*)    GFR calc non Af Amer 34 (*)    GFR calc Af Amer 40 (*)    All other components within normal limits  URINE RAPID DRUG SCREEN, HOSP PERFORMED - Abnormal; Notable for the following:    Benzodiazepines POSITIVE (*)    All other components within normal limits  URINALYSIS, ROUTINE W REFLEX MICROSCOPIC (NOT AT [facility]) - Abnormal; Notable for the following:    APPearance CLOUDY (*)    Glucose, UA >1000 (*)    Ketones, ur 15 (*)    Leukocytes, UA SMALL (*)    All other components within normal limits  URINE MICROSCOPIC-ADD ON - Abnormal; Notable for the following:    Squamous Epithelial / LPF MANY (*)    Bacteria, UA MANY (*)    Casts HYALINE CASTS (*)    All other components within normal limits  CBG MONITORING, ED - Abnormal; Notable for the following:    Glucose-Capillary 238 (*)    All other components within normal limits  I-STAT CHEM 8, ED - Abnormal; Notable for the following:    BUN 25 (*)    Creatinine, Ser 1.20 (*)    Glucose, Bld 264 (*)    All other components within normal limits  CK  ETHANOL  PROTIME-INR  APTT  DIFFERENTIAL  URINALYSIS, ROUTINE W REFLEX MICROSCOPIC (NOT AT [facility])  I-STAT TROPOININ, ED    Imaging Review Ct Head Wo Contrast  06/13/2015   CLINICAL DATA:  Found unresponsive.  EXAM: CT HEAD WITHOUT CONTRAST  TECHNIQUE: Contiguous axial images were obtained from the base of the skull through the vertex without intravenous contrast.  COMPARISON:  12/11/2009  FINDINGS: Prominence of the sulci and ventricles identified consistent with brain atrophy. There is mild low attenuation within the subcortical and periventricular white matter. There is no acute  brain infarct, intracranial hemorrhage or mass identified. The paranasal stress set there is partial opacification of the sphenoid sinus. The remaining paranasal sinuses are clear. The calvarium appears to be intact.  IMPRESSION: 1. No acute intracranial abnormalities. 2. Brain atrophy and mild chronic microvascular disease.   Electronically Signed   By: Signa Kell M.D.   On: 06/13/2015 11:13     EKG Interpretation   Date/Time:  Saturday June 13 2015 10:23:03 EDT Ventricular Rate:  55 PR Interval:  187 QRS Duration: 98 QT Interval:  474 QTC Calculation: 453 R Axis:   -21 Text Interpretation:  Sinus rhythm LVH with secondary  repolarization  abnormality No significant change since last tracing Confirmed by Bebe Shaggy   MD, DONALD ([id number]) on 06/13/2015 11:10:08 AM      MDM   Final diagnoses:  Stroke-like symptoms  Expressive aphasia   Concern for stroke vs AMS. Higher suspicion for stroke. No fever or leukocytosis indicating infectious process. LSN 12 hours ago, out of tpa window. Workup without acute findings. Head CT negative. I spoke with Dr. Amada Jupiter with neurology who will consult patient. Admit to medicine. Admission accepted by PA York, TR[facility], neuro tele bed.  Discussed with attending Dr. Bebe Shaggy who also evaluated patient and agrees with plan of care.  Kathrynn Speed, PA-C 06/13/15 1152  Zadie Rhine, MD 06/13/15 1153

## 2015-06-13 NOTE — Evaluation (Signed)
Clinical/Bedside Swallow Evaluation Patient Details  Name: Holly Sparks MRN: [id number] Date of Birth: 1941-08-16  Today's Date: 06/13/2015 Time: SLP Start Time (ACUTE ONLY): 1440 SLP Stop Time (ACUTE ONLY): 1504 SLP Time Calculation (min) (ACUTE ONLY): 24 min  Past Medical History:  Past Medical History  Diagnosis Date  . Hypertension     x 10 yrs  . 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 High cholesterol    Past Surgical History:  Past Surgical History  Procedure Laterality Date  . Neck surgery      x 2 for spur and disc problems  . Colonoscopy    . Colonoscopy N/A 05/08/2013    Procedure: COLONOSCOPY;  Surgeon: Malissa Hippo, MD;  [facility];  Service: Endoscopy;  Laterality: N/A;  1015   HPI:  Holly Sparks is a 74 y.o. female, with hypertension, hyperlipidemia, diabetes and recently an irregular heartbeat. She presents to the ER this morning after having difficulty speaking or following commands. The patient is unable to provide a history due to aphasia. History is gathered from the ER PA, the patient's minister, and her two sons. The patient was at a church retreat in [place]. She had been feeling hot and tired since Wednesday 7/13. She was last seen normal by her roommate at 10 PM last evening when she went to bed. She had been feeling hot and tired during the evening. This morning at approximately 7:30 AM the roommate heard the patient moaning and went to check on her. The patient was unable to speak clearly or follow commands.Holly Sparks is a 74 y.o. female, with hypertension, hyperlipidemia, diabetes and recently an irregular heartbeat. She presents to the ER this morning after having difficulty speaking or following commands. The patient is unable to provide a history due to aphasia. History is gathered from the ER PA, the patient's minister, and her two sons. The patient was at a church retreat in [place]. She had been feeling hot and tired since  Wednesday 7/13. She was last seen normal by her roommate at 10 PM last evening when she went to bed. She had been feeling hot and tired during the evening. This morning at approximately 7:30 AM the roommate heard the patient moaning and went to check on her. The patient was unable to speak clearly or follow commands.   Assessment / Plan / Recommendation Clinical Impression  Clinical swallowing evaluation was completed.  Full oral motor exam was unable to be completed as the patient had difficulty following commands.  However, the patient has a right sided facial droop with questionable decrease in sensation.  The patient presented with oral/pharyngeal dysphagia characterized by delayed oral transit, decreased awareness of the bolus for dual texture, and delayed swallow trigger.  Immediate cough was noted given thin liquids via cup sips.  Recommend a dysphagia 1 diet with nectar thick liquids pending an MBS on Monday.      Aspiration Risk  Mild    Diet Recommendation Dysphagia 1 (Puree);Nectar   Medication Administration: Crushed with puree Compensations: Slow rate;Small sips/bites (Stop feeding/eating if coughing.  )    Other  Recommendations Oral Care Recommendations: Oral care BID Other Recommendations: Order thickener from pharmacy;Prohibited food (jello, ice cream, thin soups);Remove water pitcher   Follow Up Recommendations    To be determined.     Frequency and Duration   To be determined.     Pertinent Vitals/Pain Patient reports no pain.  SLP Swallow Goals     To be determined.       Swallow Study Prior Functional Status   Independent prior to admission.      General Date of Onset: 06/13/15 Other Pertinent Information: Holly Sparks is a 74 y.o. female, with hypertension, hyperlipidemia, diabetes and recently an irregular heartbeat. She presents to the ER this morning after having difficulty speaking or following commands. The patient is unable to provide a history  due to aphasia. History is gathered from the ER PA, the patient's minister, and her two sons. The patient was at a church retreat in [place]. She had been feeling hot and tired since Wednesday 7/13. She was last seen normal by her roommate at 10 PM last evening when she went to bed. She had been feeling hot and tired during the evening. This morning at approximately 7:30 AM the roommate heard the patient moaning and went to check on her. The patient was unable to speak clearly or follow commands.Holly Sparks is a 74 y.o. female, with hypertension, hyperlipidemia, diabetes and recently an irregular heartbeat. She presents to the ER this morning after having difficulty speaking or following commands. The patient is unable to provide a history due to aphasia. History is gathered from the ER PA, the patient's minister, and her two sons. The patient was at a church retreat in [place]. She had been feeling hot and tired since Wednesday 7/13. She was last seen normal by her roommate at 10 PM last evening when she went to bed. She had been feeling hot and tired during the evening. This morning at approximately 7:30 AM the roommate heard the patient moaning and went to check on her. The patient was unable to speak clearly or follow commands. Type of Study: Bedside swallow evaluation Previous Swallow Assessment: None noted. Diet Prior to this Study: NPO Temperature Spikes Noted:  (No temps charted at this time.  ) Respiratory Status: Room air History of Recent Intubation: No Behavior/Cognition: Alert;Cooperative;Requires cueing Self-Feeding Abilities: Needs assist Patient Positioning: Upright in bed Baseline Vocal Quality: Low vocal intensity Volitional Cough: Cognitively unable to elicit Volitional Swallow: Unable to elicit    Oral/Motor/Sensory Function Overall Oral Motor/Sensory Function:  (Unable to fully assess as pt unable to follow all commands. ) Labial ROM: Reduced right Labial Symmetry:  Abnormal symmetry right Labial Strength:  (Unable to assess.) Labial Sensation:  (Unable to assess.) Lingual ROM:  (Unable to assess.) Lingual Symmetry: Within Functional Limits Lingual Strength:  (Unable to assess.) Lingual Sensation:  (Unable to assess.) Facial ROM: Reduced right Facial Symmetry: Right droop Facial Strength: Reduced Facial Sensation:  (Unable to assess.) Mandible: Within Functional Limits   Ice Chips Ice chips: Not tested   Thin Liquid Thin Liquid: Impaired Presentation: Cup;Spoon;Self Fed (self fed with assistance.) Oral Phase Impairments: Reduced labial seal Oral Phase Functional Implications: Right anterior spillage (mild) Pharyngeal  Phase Impairments: Suspected delayed Swallow;Cough - Immediate (cough given cup sip only.)    Nectar Thick Nectar Thick Liquid: Impaired Presentation: Cup;Self Fed (self fed with assistance) Oral Phase Impairments: Reduced labial seal Oral phase functional implications: Right anterior spillage (mild) Pharyngeal Phase Impairments: Suspected delayed Swallow   Honey Thick Honey Thick Liquid: Not tested   Puree Puree: Impaired Presentation: Spoon Oral Phase Impairments: Impaired anterior to posterior transit Oral Phase Functional Implications: Prolonged oral transit Pharyngeal Phase Impairments: Suspected delayed Swallow   Solid   GO    Solid: Impaired (dual texture - cracker in applesauce) Presentation: Spoon Oral Phase Impairments:  Impaired anterior to posterior transit;Poor awareness of bolus;Impaired mastication Oral Phase Functional Implications: Oral residue (delayed oral transit) Pharyngeal Phase Impairments: Suspected delayed Jack QuartoSwallow       Thania Woodlief N 06/13/2015,3:18 PM Dimas AguasMelissa Jo-Ann Johanning, MA, CCC-SLP Acute Rehab SLP [phone]

## 2015-06-13 NOTE — ED Provider Notes (Signed)
Patient seen/examined in the Emergency Department in conjunction with Midlevel Provider Hess Patient presents for altered mental status.  Last known well last night Exam : awake/alert.  She appears confused.  She has expressive aphasia.  She appears to have right UE drift Plan: stroke workup ordered  tPA in stroke considered but not given due to: Onset over 3-4.5hours     Holly Rhineonald Rozlyn Yerby, MD 06/13/15 1046

## 2015-06-13 NOTE — ED Notes (Signed)
Pt comes in from a church retreat in [place]. Pt had a roommate at the time and the LSN was 2200. Pt was found unresponsive at 0930 this morning. Pt unable to follow commands, garbled speech and drooling noted upon arrival of EMS. 20G IV started en route to hospital.

## 2015-06-14 ENCOUNTER — Inpatient Hospital Stay (HOSPITAL_COMMUNITY): Payer: Medicare Other

## 2015-06-14 DIAGNOSIS — I6789 Other cerebrovascular disease: Secondary | ICD-10-CM

## 2015-06-14 DIAGNOSIS — R4701 Aphasia: Secondary | ICD-10-CM

## 2015-06-14 LAB — GLUCOSE, CAPILLARY
GLUCOSE-CAPILLARY: 136 mg/dL — AB (ref 65–99)
GLUCOSE-CAPILLARY: 148 mg/dL — AB (ref 65–99)
GLUCOSE-CAPILLARY: 155 mg/dL — AB (ref 65–99)
GLUCOSE-CAPILLARY: 162 mg/dL — AB (ref 65–99)
Glucose-Capillary: 115 mg/dL — ABNORMAL HIGH (ref 65–99)
Glucose-Capillary: 138 mg/dL — ABNORMAL HIGH (ref 65–99)

## 2015-06-14 LAB — BASIC METABOLIC PANEL
Anion gap: 5 (ref 5–15)
BUN: 14 mg/dL (ref 6–20)
CALCIUM: 8.8 mg/dL — AB (ref 8.9–10.3)
CO2: 30 mmol/L (ref 22–32)
Chloride: 101 mmol/L (ref 101–111)
Creatinine, Ser: 1.05 mg/dL — ABNORMAL HIGH (ref 0.44–1.00)
GFR calc Af Amer: 60 mL/min — ABNORMAL LOW (ref >60)
GFR calc non Af Amer: 51 mL/min — ABNORMAL LOW (ref >60)
Glucose, Bld: 156 mg/dL — ABNORMAL HIGH (ref 65–99)
POTASSIUM: 4 mmol/L (ref 3.5–5.1)
Sodium: 136 mmol/L (ref 135–145)

## 2015-06-14 LAB — URINE CULTURE: CULTURE: NO GROWTH

## 2015-06-14 LAB — TROPONIN I: Troponin I: <0.03 ng/mL (ref <0.031)

## 2015-06-14 LAB — LIPID PANEL
Cholesterol: 135 mg/dL (ref 0–200)
HDL: 28 mg/dL — ABNORMAL LOW (ref >40)
LDL Cholesterol: 70 mg/dL (ref 0–99)
TRIGLYCERIDES: 185 mg/dL — AB (ref <150)
Total CHOL/HDL Ratio: 4.8 RATIO
VLDL: 37 mg/dL (ref 0–40)

## 2015-06-14 MED ORDER — LORAZEPAM 0.5 MG PO TABS: 0.5000 mg | ORAL_TABLET | Freq: Two times a day (BID) | ORAL | Status: DC | PRN

## 2015-06-14 MED ORDER — ENOXAPARIN SODIUM 40 MG/0.4ML ~~LOC~~ SOLN
40.0000 mg | SUBCUTANEOUS | Status: DC
  Administered 2015-06-14: 40 mg via SUBCUTANEOUS
  Filled 2015-06-14: qty 0.4

## 2015-06-14 MED ORDER — ATORVASTATIN CALCIUM 10 MG PO TABS: 10.0000 mg | ORAL_TABLET | Freq: Every day | ORAL | Status: DC

## 2015-06-14 MED ORDER — PANTOPRAZOLE SODIUM 40 MG PO TBEC
40.0000 mg | DELAYED_RELEASE_TABLET | Freq: Every day | ORAL | Status: DC
  Administered 2015-06-14 – 2015-06-16 (×3): 40 mg via ORAL
  Filled 2015-06-14 (×3): qty 1

## 2015-06-14 MED ORDER — HYOSCYAMINE SULFATE 0.125 MG SL SUBL
0.1250 mg | SUBLINGUAL_TABLET | SUBLINGUAL | Status: DC | PRN
  Filled 2015-06-14: qty 1

## 2015-06-14 MED ORDER — ATORVASTATIN CALCIUM 10 MG PO TABS
20.0000 mg | ORAL_TABLET | Freq: Every day | ORAL | Status: DC
  Administered 2015-06-14 – 2015-06-15 (×2): 20 mg via ORAL
  Filled 2015-06-14 (×2): qty 2

## 2015-06-14 MED ORDER — TRAMADOL HCL 50 MG PO TABS: 50.0000 mg | ORAL_TABLET | Freq: Four times a day (QID) | ORAL | Status: DC | PRN

## 2015-06-14 MED ORDER — APIXABAN 5 MG PO TABS
5.0000 mg | ORAL_TABLET | Freq: Two times a day (BID) | ORAL | Status: DC
  Administered 2015-06-14 – 2015-06-16 (×4): 5 mg via ORAL
  Filled 2015-06-14 (×4): qty 1

## 2015-06-14 NOTE — Progress Notes (Signed)
STROKE TEAM PROGRESS NOTE   HISTORY Holly Sparks is a 74 y.o. female with a history of diabetes mellitus, hypertension and hyperlipidemia who takes aspirin 81 mg on a daily basis. She was at a church retreat Friday night and was last seen well at approximately 10 PM. Today an approximate 7:30 AM she was found to have aphasia with inability to follow most commands. She was brought to the emergency room where a CT scan of the head was negative for acute findings. She has been admitted for a full stroke workup.  A review of her old records revealed an admission 09/21/2003 at [facility] for palpitations. She was noted to have acute onset of atrial fibrillation which later spontaneously converted to sinus rhythm. She was evaluated by [facility] and placed on diltiazem, metoprolol and aspirin 325 mg daily .The family was not aware of any history of irregular rhythms. An EKG at the time of this admission showed sinus rhythm.  Date last known well: Date: 06/12/2015 Time last known well: Unable to determine tPA Given: No: Late presentation and unknown time of onset   SUBJECTIVE (INTERVAL HISTORY) The patient's son and daughter are present today. The patient's speech is more fluent today and she is better following commands although she is still very aphasic and has difficulty with anything more than one-step simple commands. She is moving all extremities well. She has significant naming problems. The family reports that the patient has only had one fall of which they are aware. The patient lives with her elderly husband.   OBJECTIVE Temp:  [96.8 F (36 C)-98.5 F (36.9 C)] 96.8 F (36 C) (07/17 0500) Pulse Rate:  [50-56] 55 (07/17 0500) Cardiac Rhythm:  [-] Sinus bradycardia (07/17 0500) Resp:  [16-26] 16 (07/17 0200) BP: (148-176)/(63-77) 151/65 mmHg (07/17 0500) SpO2:  [96 %-100 %] 100 % (07/17 0500) Weight:  [71.215 kg (157 lb)] 71.215 kg (157 lb) (07/16  1018)   Recent Labs Lab 06/13/15 1612 06/13/15 2114 06/13/15 2354 06/14/15 0405 06/14/15 0633  GLUCAP 200* 195* 179* 162* 155*    Recent Labs Lab 06/13/15 1026 06/13/15 1035 06/14/15 0230  NA 137 138 136  K 4.6 4.5 4.0  CL 103 102 101  CO2 24  --  30  GLUCOSE 264* 264* 156*  BUN 23* 25* 14  CREATININE 1.47* 1.20* 1.05*  CALCIUM 9.3  --  8.8*    Recent Labs Lab 06/13/15 1026  AST 22  ALT 13*  ALKPHOS 71  BILITOT 0.7  PROT 6.1*  ALBUMIN 3.5    Recent Labs Lab 06/13/15 1026 06/13/15 1035  WBC 7.5  --   NEUTROABS 5.6  --   HGB 13.7 14.3  HCT 39.8 42.0  MCV 84.3  --   PLT 127*  --     Recent Labs Lab 06/13/15 1026 06/13/15 1523 06/13/15 2045 06/14/15 0230  CKTOTAL 50  --   --   --   TROPONINI  --  <0.03 <0.03 <0.03    Recent Labs  06/13/15 1026  LABPROT 14.5  INR 1.12    Recent Labs  06/13/15 1035 06/13/15 1141  COLORURINE YELLOW YELLOW  LABSPEC 1.026 1.023  PHURINE 5.0 5.5  GLUCOSEU >1000* >1000*  HGBUR NEGATIVE NEGATIVE  BILIRUBINUR NEGATIVE NEGATIVE  KETONESUR 15* 15*  PROTEINUR NEGATIVE NEGATIVE  UROBILINOGEN 0.2 0.2  NITRITE NEGATIVE NEGATIVE  LEUKOCYTESUR SMALL* NEGATIVE       Component Value Date/Time   CHOL 135 06/14/2015 0230   TRIG  185* 06/14/2015 0230   HDL 28* 06/14/2015 0230   CHOLHDL 4.8 06/14/2015 0230   VLDL 37 06/14/2015 0230   LDLCALC 70 06/14/2015 0230   Lab Results  Component Value Date   HGBA1C * 12/10/2009    7.6 (NOTE) The ADA recommends the following therapeutic goal for glycemic control related to Hgb A1c measurement: Goal of therapy: <6.5 Hgb A1c  Reference: American Diabetes Association: Clinical Practice Recommendations 2010, Diabetes Care, 2010, 33: (Suppl  1).      Component Value Date/Time   LABOPIA NONE DETECTED 06/13/2015 1035   COCAINSCRNUR NONE DETECTED 06/13/2015 1035   LABBENZ POSITIVE* 06/13/2015 1035   AMPHETMU NONE DETECTED 06/13/2015 1035   THCU NONE DETECTED 06/13/2015 1035    LABBARB NONE DETECTED 06/13/2015 1035     Recent Labs Lab 06/13/15 1026  ETH <5    Imaging   Dg Chest 1 View 06/13/2015    No active disease.      Ct Head Wo Contrast 06/13/2015    1. No acute intracranial abnormalities.  2. Brain atrophy and mild chronic microvascular disease.      Mr Maxine Glenn Head Wo Contrast 06/13/2015    1. Acute left MCA territory infarct, most confluent involvement in the left basal ganglia. There is basal ganglia petechial hemorrhage, but no significant mass effect.  2. Negative intracranial MRA. No left MCA branch occlusion identified.  3. Aside from the acute findings, normal for age noncontrast MRI appearance of the brain.          PHYSICAL EXAM Pleasant elderly Caucasian lady not in distress. . Afebrile. Head is nontraumatic. Neck is supple without bruit.    Cardiac exam no murmur or gallop. Lungs are clear to auscultation. Distal pulses are well felt. Neurological Exam : Awake alert globally aphasic expressive greater than receptive. Can speak occasional words and guttural sounds. Follows only simple midline and few 1 step commands only. Unable to name or repeat. Extraocular moments are full range without nystagmus. Pupils are equal reactive. Fundi were not visualized. Vision acuity seems adequate bilaterally. Visual fields seem full to bedside confrontational testing. Face is asymmetric with moderate right lower facial weakness. Tongue is midline. Motor system exam reveals no upper extremity drift. Fine finger movements are diminished on the right orbits left over right upper extremity. Mild weakness of right hip flexor and ankle dorsiflexors only but no drift. Touch and pinprick sensation seems preserved bilaterally. Plantars are downgoing. Deep tendon reflexes are symmetric. Gait was not tested.     ASSESSMENT/PLAN Holly Sparks is a 74 y.o. female with history of diabetes mellitus, hypertension, IBS, hyperlipidemia, and remote atrial  fibrillation not anticoagulated presenting with expressive and receptive aphasia. She did not receive IV t-PAdue to late presentation.   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  Resultant  expressive and receptive aphasia  MRI  Acute left MCA territory infarct, most confluent involvement in the left basal ganglia. There is basal ganglia petechial hemorrhage.  MRA  negative  Carotid Doppler  1-39% ICA stenosis. Vertebral artery flow is antegrade.  2D Echo  pending  LDL 70  HgbA1c pending  Lovenox for VTE prophylaxis  DIET - DYS 1 Room service appropriate?: Yes; Fluid consistency:: Nectar Thick  aspirin 81 mg orally every day prior to admission, now on eliquis (apixaban)  Ongoing aggressive stroke risk factor management  Therapy recommendations:  CIR - rehabilitation M.D. consult ordered and pending.  Disposition:  Pending  Hypertension  Home meds: Lisinopril, hydrochlorothiazide and metoprolol  Stable  Hyperlipidemia  Home meds: Lipitor 10 mg daily resumed in hospital  LDL 70, goal < 70  Continue statin at discharge  Diabetes  HgbA1c pending, goal < 7.0  Uncontrolled  Other Stroke Risk Factors  Advanced age   Other Active Problems  Mild renal insufficiency   PLAN  Discussed with Dr. Pearlean BrownieSethi - initiate Eliquis therapy  Patient should be a good inpatient rehabilitation candidate - supportive family  Hospital day # 1  Delton Seeavid Rinehuls PA-C [facility] Pager [phone] 06/14/2015, 9:28 AM I have personally examined this patient, reviewed notes, independently viewed imaging studies, participated in medical decision making and plan of care. I have made any additions or clarifications directly to the above note. Agree with note above. She presented with expressive and receptive aphasia and right-sided weakness secondary to embolic left MCA branch infarct likely from atrial fibrillation. She remains at  risk for neurological worsening, recurrent stroke or TIA and needs ongoing stroke evaluation and aggressive risk factor control. Recommend long-term anticoagulation with eliquis. Long discussion of the bedside with the patient and multiple family members and answered questions.  Delia HeadyPramod Elianie Hubers, MD Medical Director [facility] Pager: [phone] 06/14/2015 6:16 PM     To contact Stroke Continuity provider, please refer to [email or web address]. After hours, contact General Neurology

## 2015-06-14 NOTE — Evaluation (Signed)
Physical Therapy Evaluation Patient Details Name: Holly Sparks MRN: [id number] DOB: 01-Sep-1941 Today's Date: 06/14/2015   History of Present Illness  Patient is a 74 yo female admitted 06/13/15 with inability to speak or follow commands.  PMH:  DM, HTN, Afib  Clinical Impression  Patient presents with problems listed below.  Will benefit from acute PT to maximize functional mobility prior to discharge.  Patient was independent pta.  Now requiring assist with all mobility/gait.  Recommend Inpatient Rehab consult to return to highest functional level.    Follow Up Recommendations CIR;Supervision/Assistance - 24 hour    Equipment Recommendations  Rolling walker with 5" wheels    Recommendations for Other Services Rehab consult     Precautions / Restrictions Precautions Precautions: Fall Restrictions Weight Bearing Restrictions: No      Mobility  Bed Mobility Overal bed mobility: Needs Assistance Bed Mobility: Rolling;Sidelying to Sit;Sit to Supine Rolling: Min assist Sidelying to sit: Mod assist   Sit to supine: Min assist   General bed mobility comments: Verbal and tactile cues for technique. Assist to raise trunk to sitting.  Used bed pad to assist with scooting to EOB.  Once upright, patient required min assist for balance.  Assist to bring LE's onto bed to return to supine.  Transfers Overall transfer level: Needs assistance Equipment used: 1 person hand held assist Transfers: Sit to/from Stand Sit to Stand: Mod assist         General transfer comment: Assist to rise to standing and for balance in stance.  Ambulation/Gait Ambulation/Gait assistance: Mod assist Ambulation Distance (Feet): 2 Feet Assistive device: 1 person hand held assist Gait Pattern/deviations: Step-to pattern;Decreased stride length;Shuffle     General Gait Details: Patient able to take 2 steps forward.  Gait very unsteady with decreased stability of RLE in stance.  Required mod assist  for balance.  Stairs            Wheelchair Mobility    Modified Rankin (Stroke Patients Only) Modified Rankin (Stroke Patients Only) Pre-Morbid Rankin Score: No symptoms Modified Rankin: Moderately severe disability     Balance Overall balance assessment: Needs assistance Sitting-balance support: Single extremity supported;Feet supported Sitting balance-Leahy Scale: Poor Sitting balance - Comments: Min assist to maintain balance   Standing balance support: Single extremity supported Standing balance-Leahy Scale: Poor                               Pertinent Vitals/Pain Pain Assessment: No/denies pain    Home Living Family/patient expects to be discharged to:: Private residence Living Arrangements: Spouse/significant other Available Help at Discharge: Family;Available 24 hours/day (Husband is 84.  Able to provide supervision) Type of Home: House Home Access: Stairs to enter Entrance Stairs-Rails: None Entrance Stairs-Number of Steps: 1 Home Layout: Two level;Able to live on main level with bedroom/bathroom Home Equipment: None Additional Comments: Information provided primarily by daughter    Prior Function Level of Independence: Independent         Comments: Driving     Hand Dominance   Dominant Hand: Right    Extremity/Trunk Assessment   Upper Extremity Assessment: Defer to OT evaluation           Lower Extremity Assessment: Generalized weakness;RLE deficits/detail RLE Deficits / Details: Strength grossly 4/5; decreased stability in stance       Communication   Communication: Receptive difficulties;Expressive difficulties  Cognition Arousal/Alertness: Awake/alert Behavior During Therapy: Flat affect Overall Cognitive Status:  Difficult to assess Area of Impairment: Following commands;Problem solving       Following Commands: Follows one step commands inconsistently;Follows one step commands with increased time     Problem  Solving: Slow processing;Decreased initiation;Difficulty sequencing;Requires tactile cues      General Comments      Exercises        Assessment/Plan    PT Assessment Patient needs continued PT services  PT Diagnosis Difficulty walking;Hemiplegia dominant side;Altered mental status   PT Problem List Decreased strength;Decreased activity tolerance;Decreased balance;Decreased mobility;Decreased cognition;Decreased knowledge of use of DME;Cardiopulmonary status limiting activity  PT Treatment Interventions DME instruction;Gait training;Functional mobility training;Therapeutic activities;Balance training;Cognitive remediation;Patient/family education   PT Goals (Current goals can be found in the Care Plan section) Acute Rehab PT Goals Patient Stated Goal: Unable to state PT Goal Formulation: With patient/family Time For Goal Achievement: 06/28/15 Potential to Achieve Goals: Good    Frequency Min 4X/week   Barriers to discharge        Co-evaluation               End of Session Equipment Utilized During Treatment: Gait belt;Oxygen Activity Tolerance: Patient limited by fatigue Patient left: in bed;with call bell/phone within reach;with bed alarm set;with family/visitor present Nurse Communication: Mobility status         Time: [id number] PT Time Calculation (min) (ACUTE ONLY): 23 min   Charges:   PT Evaluation $Initial PT Evaluation Tier I: 1 Procedure PT Treatments $Therapeutic Activity: 8-22 mins   PT G Codes:        Vena AustriaDavis, Codi Folkerts H 06/14/2015, 1:24 PM Durenda HurtSusan H. Renaldo Fiddleravis, PT, [facility] HospitalMBA Acute Rehab Services Pager [phone]

## 2015-06-14 NOTE — Progress Notes (Signed)
ANTICOAGULATION CONSULT NOTE - Initial Consult  Pharmacy Consult for apixaban  Indication: a fib  No Known Allergies  Patient Measurements: Height: 5\' 6"  (167.6 cm) Weight: 157 lb (71.215 kg) IBW/kg (Calculated) : 59.3  Vital Signs: Temp: 97.9 F (36.6 C) (07/17 1425) Temp Source: Oral (07/17 1425) BP: 144/75 mmHg (07/17 1425) Pulse Rate: 58 (07/17 1425)  Labs:  Recent Labs  06/13/15 1026 06/13/15 1035 06/13/15 1523 06/13/15 2045 06/14/15 0230  HGB 13.7 14.3  --   --   --   HCT 39.8 42.0  --   --   --   PLT 127*  --   --   --   --   APTT 28  --   --   --   --   LABPROT 14.5  --   --   --   --   INR 1.12  --   --   --   --   CREATININE 1.47* 1.20*  --   --  1.05*  CKTOTAL 50  --   --   --   --   TROPONINI  --   --  <0.03 <0.03 <0.03    Estimated Creatinine Clearance: 48.3 mL/min (by C-G formula based on Cr of 1.05).   Medical History: Past Medical History  Diagnosis Date  . Hypertension     x 10 yrs  . 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 High cholesterol   . Presence of permanent cardiac pacemaker      Assessment: Patient with new onset a fib. SCr 1.05   Plan:  -D/C enoxaparin -Apixaban  5mg  BID -Monitor for s/sx of bleeding  Pharmacy will sign off  Isaac BlissMichael Richard Ritchey, PharmD, [facility] Fort WorthBCPS Clinical Pharmacist Pager [phone] 06/14/2015 2:32 PM

## 2015-06-14 NOTE — Progress Notes (Signed)
[facility] PROGRESS NOTE  Holly Sparks:[id number] DOB: 1941/03/31 DOA: 06/13/2015 PCP: Milana Obey, MD  Assessment/Plan: #1 acute left MCA territory infarct Unkown etiology. Patient without expressive or receptive aphasia with some improvement since admission. Stroke workup underway including carotid Dopplers which are pending. 2-D echo is pending. Fasting lipid panel with LDL of 70. Hemoglobin A1c pending. Increase home dose Lipitor to 20 mg daily.  PT/OT/ST. Patient had prior admission at [facility] for palpitations on 09/21/2003 and was noted to have acute onset of atrial fibrillation which spontaneously converted to normal sinus rhythm. Patient at that time was placed on diltiazem and metoprolol for rate control as well as aspirin. Monitor on telemetry. Patient currently on a dysphagia 1 diet. Patient for modified swallow tomorrow. Continue full dose aspirin for secondary stroke prevention. Risk factor modification. Neurology following and appreciate input and recommendations.  #2 hypertension Permissive hypertension secondary to problem #1. BP meds.  #3 hyperlipidemia Fasting lipid panel of 70. Increase home dose Lipitor to 20 mg daily. Follow.  #4 diabetes mellitus type 2 Check a hemoglobin A1c. CBGs ranged from 155-195. Continue Lantus. Continue sliding scale insulin.  #5 elevated creatinine Likely secondary to prerenal azotemia. Improved with hydration. Follow.  #6 abnormal EKG Patient asymptomatic. Cardiac enzymes negative 3. 2-D echo pending.  #7 prophylaxis Lovenox for DVT prophylaxis.  Code Status: Full Family Communication: Updated patient and daughter at bedside. Disposition Plan: Hopefully CIR after stroke workup is done.   Consultants:  Neurology: Dr. Amada Jupiter 06/13/2015  Procedures:  CT head 06/13/2015  MRI/ MRA head 06/13/2015  Antibiotics: None  HPI/Subjective: Patient denies any chest pain. No shortness of breath.  Patient with expressive and receptive aphasia with some improvement since admission.  Objective: Filed Vitals:   06/14/15 0924  BP: 125/72  Pulse: 83  Temp: 98.4 F (36.9 C)  Resp: 16   No intake or output data in the 24 hours ending 06/14/15 1129 Filed Weights   06/13/15 1018  Weight: 71.215 kg (157 lb)    Exam:   General:  NAD  Cardiovascular: RRR  Respiratory: CTAB anterior lung fields.  Abdomen: Soft, nontender, nondistended, positive bowel sounds.  Musculoskeletal: No clubbing cyanosis or edema. 5 out of 5 bilateral upper extremity strength. 5 out of 5 left lower extremity strength. 4 out of 5 right lower extremity strength.  Neurological: Patient still with an expressive and receptive aphasia however slowly improving. Right facial weakness.   Data Reviewed: Basic Metabolic Panel:  Recent Labs Lab 06/13/15 1026 06/13/15 1035 06/14/15 0230  NA 137 138 136  K 4.6 4.5 4.0  CL 103 102 101  CO2 24  --  30  GLUCOSE 264* 264* 156*  BUN 23* 25* 14  CREATININE 1.47* 1.20* 1.05*  CALCIUM 9.3  --  8.8*   Liver Function Tests:  Recent Labs Lab 06/13/15 1026  AST 22  ALT 13*  ALKPHOS 71  BILITOT 0.7  PROT 6.1*  ALBUMIN 3.5   No results for input(s): LIPASE, AMYLASE in the last 168 hours. No results for input(s): AMMONIA in the last 168 hours. CBC:  Recent Labs Lab 06/13/15 1026 06/13/15 1035  WBC 7.5  --   NEUTROABS 5.6  --   HGB 13.7 14.3  HCT 39.8 42.0  MCV 84.3  --   PLT 127*  --    Cardiac Enzymes:  Recent Labs Lab 06/13/15 1026 06/13/15 1523 06/13/15 2045 06/14/15 0230  CKTOTAL 50  --   --   --  TROPONINI  --  <0.03 <0.03 <0.03   BNP (last 3 results) No results for input(s): BNP in the last 8760 hours.  ProBNP (last 3 results) No results for input(s): PROBNP in the last 8760 hours.  CBG:  Recent Labs Lab 06/13/15 1612 06/13/15 2114 06/13/15 2354 06/14/15 0405 06/14/15 0633  GLUCAP 200* 195* 179* 162* 155*    No  results found for this or any previous visit (from the past 240 hour(s)).   Studies: Dg Chest 1 View  06/13/2015   CLINICAL DATA:  Possible aspiration  EXAM: CHEST  1 VIEW  COMPARISON:  12/29/2010  FINDINGS: Cardiac shadow is mildly enlarged. The lungs are well aerated bilaterally. No focal infiltrate or sizable effusion is seen. Postsurgical changes are noted in the cervical spine.  IMPRESSION: No active disease.   Electronically Signed   By: Alcide CleverMark  Lukens M.D.   On: 06/13/2015 18:32   Ct Head Wo Contrast  06/13/2015   CLINICAL DATA:  Found unresponsive.  EXAM: CT HEAD WITHOUT CONTRAST  TECHNIQUE: Contiguous axial images were obtained from the base of the skull through the vertex without intravenous contrast.  COMPARISON:  12/11/2009  FINDINGS: Prominence of the sulci and ventricles identified consistent with brain atrophy. There is mild low attenuation within the subcortical and periventricular white matter. There is no acute brain infarct, intracranial hemorrhage or mass identified. The paranasal stress set there is partial opacification of the sphenoid sinus. The remaining paranasal sinuses are clear. The calvarium appears to be intact.  IMPRESSION: 1. No acute intracranial abnormalities. 2. Brain atrophy and mild chronic microvascular disease.   Electronically Signed   By: Signa Kellaylor  Stroud M.D.   On: 06/13/2015 11:13   Mr Holly Sparks Head Wo Contrast  06/13/2015   CLINICAL DATA:  74 year old female with acute onset abnormal speech and difficulty following commands. Initial encounter.  EXAM: MRI HEAD WITHOUT CONTRAST  MRA HEAD WITHOUT CONTRAST  TECHNIQUE: Multiplanar, multiecho pulse sequences of the brain and surrounding structures were obtained without intravenous contrast. Angiographic images of the head were obtained using MRA technique without contrast.  COMPARISON:  Head CT without contrast 1056 hours today. Cervical spine MRI 12/15/2009.  FINDINGS: MRI HEAD FINDINGS  Scattered areas of restricted diffusion  in the left MCA territory. The most confluent involvement is in the left basal ganglia. There is also left superior frontal gyrus pre motor cortex involvement. There is mild involvement in the left parietal lobe. There is also suggestion of left middle temporal gyrus involvement.  No contralateral or posterior fossa restricted diffusion. Major intracranial vascular flow voids are within normal limits.  Mild T2 and FLAIR hyperintensity in the acutely affected areas. There is petechial hemorrhage in the left basal ganglia. No significant mass effect. There is also a small focus of petechial hemorrhage or chronic micro hemorrhage in the left temporal lobe.  No midline shift, mass effect, evidence of mass lesion, ventriculomegaly, or extra-axial collection. Cervicomedullary junction and pituitary are within normal limits. Elsewhere gray and white matter signal is normal for age. Visible internal auditory structures appear normal. Trace retained secretions in the nasopharynx. Mastoids are clear. Paranasal sinus mucosal thickening most pronounced in the sphenoid. Orbits soft tissues are within normal limits. Negative scalp soft tissues. Visualized cervical spine remarkable for partially visible ACDF hardware with mild susceptibility artifact.  MRA HEAD FINDINGS  Antegrade flow in the posterior circulation. Codominant distal vertebral arteries. Normal PICA origins. Normal vertebrobasilar junction. No basilar stenosis. Normal SCA and right PCA origins. Fetal type left  PCA origin. Diminutive or absent right posterior communicating artery. Bilateral PCA branches are within normal limits.  Antegrade flow in both ICA siphons. No siphon stenosis. Ophthalmic and posterior communicating artery origins are within normal limits. Carotid termini appear normal. MCA and ACA origins are within normal limits. Anterior communicating artery and visualized bilateral ACA branches are within normal limits. Right MCA M1 segment and branches  are within normal limits.  Left MCA M1 segment and bifurcation are within normal limits. No major left MCA branch occlusion is identified.  IMPRESSION: 1. Acute left MCA territory infarct, most confluent involvement in the left basal ganglia. There is basal ganglia petechial hemorrhage, but no significant mass effect. 2. Negative intracranial MRA. No left MCA branch occlusion identified. 3. Aside from the acute findings, normal for age noncontrast MRI appearance of the brain.   Electronically Signed   By: Odessa Fleming M.D.   On: 06/13/2015 18:21   Mr Laqueta Jean ZO Contrast  06/13/2015   CLINICAL DATA:  74 year old female with acute onset abnormal speech and difficulty following commands. Initial encounter.  EXAM: MRI HEAD WITHOUT CONTRAST  MRA HEAD WITHOUT CONTRAST  TECHNIQUE: Multiplanar, multiecho pulse sequences of the brain and surrounding structures were obtained without intravenous contrast. Angiographic images of the head were obtained using MRA technique without contrast.  COMPARISON:  Head CT without contrast 1056 hours today. Cervical spine MRI 12/15/2009.  FINDINGS: MRI HEAD FINDINGS  Scattered areas of restricted diffusion in the left MCA territory. The most confluent involvement is in the left basal ganglia. There is also left superior frontal gyrus pre motor cortex involvement. There is mild involvement in the left parietal lobe. There is also suggestion of left middle temporal gyrus involvement.  No contralateral or posterior fossa restricted diffusion. Major intracranial vascular flow voids are within normal limits.  Mild T2 and FLAIR hyperintensity in the acutely affected areas. There is petechial hemorrhage in the left basal ganglia. No significant mass effect. There is also a small focus of petechial hemorrhage or chronic micro hemorrhage in the left temporal lobe.  No midline shift, mass effect, evidence of mass lesion, ventriculomegaly, or extra-axial collection. Cervicomedullary junction and  pituitary are within normal limits. Elsewhere gray and white matter signal is normal for age. Visible internal auditory structures appear normal. Trace retained secretions in the nasopharynx. Mastoids are clear. Paranasal sinus mucosal thickening most pronounced in the sphenoid. Orbits soft tissues are within normal limits. Negative scalp soft tissues. Visualized cervical spine remarkable for partially visible ACDF hardware with mild susceptibility artifact.  MRA HEAD FINDINGS  Antegrade flow in the posterior circulation. Codominant distal vertebral arteries. Normal PICA origins. Normal vertebrobasilar junction. No basilar stenosis. Normal SCA and right PCA origins. Fetal type left PCA origin. Diminutive or absent right posterior communicating artery. Bilateral PCA branches are within normal limits.  Antegrade flow in both ICA siphons. No siphon stenosis. Ophthalmic and posterior communicating artery origins are within normal limits. Carotid termini appear normal. MCA and ACA origins are within normal limits. Anterior communicating artery and visualized bilateral ACA branches are within normal limits. Right MCA M1 segment and branches are within normal limits.  Left MCA M1 segment and bifurcation are within normal limits. No major left MCA branch occlusion is identified.  IMPRESSION: 1. Acute left MCA territory infarct, most confluent involvement in the left basal ganglia. There is basal ganglia petechial hemorrhage, but no significant mass effect. 2. Negative intracranial MRA. No left MCA branch occlusion identified. 3. Aside from the acute findings,  normal for age noncontrast MRI appearance of the brain.   Electronically Signed   By: Odessa Fleming M.D.   On: 06/13/2015 18:21    Scheduled Meds: . aspirin  300 mg Rectal Daily   Or  . aspirin  325 mg Oral Daily  . atorvastatin  10 mg Oral QHS  . heparin  5,000 Units Subcutaneous 3 times per day  . insulin aspart  0-9 Units Subcutaneous 6 times per day  . insulin  glargine  10 Units Subcutaneous QHS  . pantoprazole  40 mg Oral Daily   Continuous Infusions: . sodium chloride 75 mL/hr at 06/13/15 1813    Principal Problem:   Cerebral infarction due to unspecified mechanism Active Problems:   IBS (irritable bowel syndrome)   High cholesterol   Diabetes   Stroke-like symptoms   HLD (hyperlipidemia)   DDD (degenerative disc disease), cervical   Elevated serum creatinine   Aphasia   Expressive aphasia   Type 2 diabetes mellitus without complication    Time spent: 31 MINS    [facility] MD [facility] Pager [phone]. If 7PM-7AM, please contact night-coverage at [email or web address], password [facility] 06/14/2015, 11:29 AM  LOS: 1 day

## 2015-06-14 NOTE — Progress Notes (Signed)
Utilization Review Completed.Holly Sparks T7/17/2016  

## 2015-06-14 NOTE — Progress Notes (Signed)
  Echocardiogram 2D Echocardiogram has been performed.  Arvil ChacoFoster, Reagann Dolce 06/14/2015, 1:10 PM

## 2015-06-14 NOTE — Progress Notes (Signed)
Bilateral carotid artery duplex completed:  1-39% ICA stenosis.  Vertebral artery flow is antegrade.     

## 2015-06-15 ENCOUNTER — Encounter (HOSPITAL_COMMUNITY): Payer: Self-pay | Admitting: Internal Medicine

## 2015-06-15 ENCOUNTER — Inpatient Hospital Stay (HOSPITAL_COMMUNITY): Payer: Medicare Other

## 2015-06-15 DIAGNOSIS — E1159 Type 2 diabetes mellitus with other circulatory complications: Secondary | ICD-10-CM

## 2015-06-15 DIAGNOSIS — G8190 Hemiplegia, unspecified affecting unspecified side: Secondary | ICD-10-CM

## 2015-06-15 DIAGNOSIS — I6340 Cerebral infarction due to embolism of unspecified cerebral artery: Secondary | ICD-10-CM

## 2015-06-15 DIAGNOSIS — I10 Essential (primary) hypertension: Secondary | ICD-10-CM | POA: Insufficient documentation

## 2015-06-15 DIAGNOSIS — E089 Diabetes mellitus due to underlying condition without complications: Secondary | ICD-10-CM

## 2015-06-15 DIAGNOSIS — I63412 Cerebral infarction due to embolism of left middle cerebral artery: Secondary | ICD-10-CM | POA: Insufficient documentation

## 2015-06-15 DIAGNOSIS — I480 Paroxysmal atrial fibrillation: Secondary | ICD-10-CM

## 2015-06-15 DIAGNOSIS — R4701 Aphasia: Secondary | ICD-10-CM

## 2015-06-15 HISTORY — DX: Paroxysmal atrial fibrillation: I48.0

## 2015-06-15 LAB — BASIC METABOLIC PANEL
Anion gap: 7 (ref 5–15)
BUN: 8 mg/dL (ref 6–20)
CALCIUM: 8.7 mg/dL — AB (ref 8.9–10.3)
CO2: 25 mmol/L (ref 22–32)
CREATININE: 0.92 mg/dL (ref 0.44–1.00)
Chloride: 106 mmol/L (ref 101–111)
Glucose, Bld: 117 mg/dL — ABNORMAL HIGH (ref 65–99)
Potassium: 3.8 mmol/L (ref 3.5–5.1)
SODIUM: 138 mmol/L (ref 135–145)

## 2015-06-15 LAB — GLUCOSE, CAPILLARY
GLUCOSE-CAPILLARY: 225 mg/dL — AB (ref 65–99)
Glucose-Capillary: 125 mg/dL — ABNORMAL HIGH (ref 65–99)
Glucose-Capillary: 121 mg/dL — ABNORMAL HIGH (ref 65–99)
Glucose-Capillary: 110 mg/dL — ABNORMAL HIGH (ref 65–99)
Glucose-Capillary: 123 mg/dL — ABNORMAL HIGH (ref 65–99)

## 2015-06-15 LAB — CBC
HEMATOCRIT: 38 % (ref 36.0–46.0)
Hemoglobin: 13.3 g/dL (ref 12.0–15.0)
MCH: 29.4 pg (ref 26.0–34.0)
MCHC: 35 g/dL (ref 30.0–36.0)
MCV: 83.9 fL (ref 78.0–100.0)
Platelets: 123 10*3/uL — ABNORMAL LOW (ref 150–400)
RBC: 4.53 MIL/uL (ref 3.87–5.11)
RDW: 13.3 % (ref 11.5–15.5)
WBC: 5 10*3/uL (ref 4.0–10.5)

## 2015-06-15 LAB — HEMOGLOBIN A1C
Hgb A1c MFr Bld: 11.8 % — ABNORMAL HIGH (ref 4.8–5.6)
Mean Plasma Glucose: 292 mg/dL

## 2015-06-15 MED ORDER — METOPROLOL TARTRATE 12.5 MG HALF TABLET
12.5000 mg | ORAL_TABLET | Freq: Every day | ORAL | Status: DC
  Administered 2015-06-15 – 2015-06-16 (×2): 12.5 mg via ORAL
  Filled 2015-06-15 (×2): qty 1

## 2015-06-15 NOTE — Evaluation (Signed)
Occupational Therapy Evaluation Patient Details Name: Holly BoschCatherine P Sparks MRN: [id number] DOB: October 15, 1941 Today's Date: 06/15/2015    History of Present Illness Patient is a 74 yo female admitted 06/13/15 with inability to speak or follow commands. MRI of brain showed Acute left MCA territory infarct, most confluent involvement in the left basal ganglia. There is basal ganglia petechial hemorrhage PMH:  DM, HTN, Afib   Clinical Impression   Pt admitted with above. She demonstrates the below listed deficits and will benefit from continued OT to maximize safety and independence with BADLs.  Pt presents to OT with generalized weakness, impaired balance, and communication deficits.  Currently, she requires min A for ADLs.  Feel she would benefit from CIR to allow her to return home at supervision - mod I level with spouse.       Follow Up Recommendations  CIR    Equipment Recommendations  None recommended by OT    Recommendations for Other Services Rehab consult     Precautions / Restrictions Precautions Precautions: Fall      Mobility Bed Mobility                  Transfers Overall transfer level: Needs assistance Equipment used: 1 person hand held assist Transfers: Sit to/from Stand;Stand Pivot Transfers Sit to Stand: Min assist Stand pivot transfers: Min assist       General transfer comment: Pt requires min A for balance     Balance Overall balance assessment: Needs assistance Sitting-balance support: Feet supported Sitting balance-Leahy Scale: Good     Standing balance support: During functional activity Standing balance-Leahy Scale: Poor Standing balance comment: requires assist                             ADL Overall ADL's : Needs assistance/impaired Eating/Feeding: Set up;Sitting   Grooming: Wash/dry hands;Wash/dry face;Oral care;Minimal assistance;Standing   Upper Body Bathing: Minimal assitance;Sitting   Lower Body Bathing: Minimal  assistance;Sit to/from stand   Upper Body Dressing : Minimal assistance;Sitting   Lower Body Dressing: Minimal assistance;Sit to/from stand   Toilet Transfer: Minimal assistance;Stand-pivot;BSC   Toileting- Clothing Manipulation and Hygiene: Minimal assistance;Sit to/from stand       Functional mobility during ADLs: Minimal assistance General ADL Comments: Assist for balance      Vision Vision Assessment?: Yes Eye Alignment: Within Functional Limits Ocular Range of Motion: Within Functional Limits Tracking/Visual Pursuits: Able to track stimulus in all quads without difficulty Visual Fields: No apparent deficits   Perception Perception Perception Tested?: Yes   Praxis Praxis Praxis tested?: Within functional limits    Pertinent Vitals/Pain Pain Assessment: No/denies pain     Hand Dominance Right   Extremity/Trunk Assessment Upper Extremity Assessment Upper Extremity Assessment: Generalized weakness   Lower Extremity Assessment Lower Extremity Assessment: Defer to PT evaluation   Cervical / Trunk Assessment Cervical / Trunk Assessment: Normal   Communication Communication Communication: Receptive difficulties;Expressive difficulties   Cognition Arousal/Alertness: Awake/alert Behavior During Therapy: WFL for tasks assessed/performed Overall Cognitive Status: Difficult to assess         Following Commands: Follows multi-step commands consistently     Problem Solving: Decreased initiation;Slow processing     General Comments       Exercises       Shoulder Instructions      Home Living Family/patient expects to be discharged to:: Private residence Living Arrangements: Spouse/significant other Available Help at Discharge: Family;Available 24 hours/day (Husband is 84.  Able to provide supervision) Type of Home: House Home Access: Stairs to enter Entrance Stairs-Number of Steps: 1 Entrance Stairs-Rails: None Home Layout: Two level;Able to live on  main level with bedroom/bathroom     Bathroom Shower/Tub: Chief Strategy Officer: Standard     Home Equipment: None   Additional Comments: Information provided primarily by daughter      Prior Functioning/Environment Level of Independence: Independent        Comments: Driving    OT Diagnosis: Generalized weakness   OT Problem List: Decreased strength;Decreased activity tolerance;Impaired balance (sitting and/or standing);Decreased safety awareness;Decreased knowledge of use of DME or AE   OT Treatment/Interventions: Self-care/ADL training;DME and/or AE instruction;Therapeutic activities;Patient/family education;Balance training    OT Goals(Current goals can be found in the care plan section) Acute Rehab OT Goals Patient Stated Goal: to return to normal  OT Goal Formulation: With patient/family Time For Goal Achievement: 06/29/15 Potential to Achieve Goals: Good ADL Goals Pt Will Perform Grooming: with modified independence;standing Pt Will Perform Upper Body Bathing: with modified independence;sitting Pt Will Perform Lower Body Bathing: with modified independence;sit to/from stand Pt Will Perform Upper Body Dressing: with modified independence;sitting Pt Will Perform Lower Body Dressing: with modified independence;sit to/from stand Pt Will Transfer to Toilet: with modified independence;ambulating;bedside commode;grab bars;regular height toilet Pt Will Perform Toileting - Clothing Manipulation and hygiene: with modified independence;sit to/from stand  OT Frequency: Min 2X/week   Barriers to D/C:            Co-evaluation              End of Session Nurse Communication: Mobility status  Activity Tolerance: Patient tolerated treatment well Patient left: in chair;with call bell/phone within reach;with chair alarm set;with family/visitor present   Time: 7829-5621 OT Time Calculation (min): 16 min Charges:  OT General Charges $OT Visit: 1  Procedure OT Evaluation $Initial OT Evaluation Tier I: 1 Procedure G-Codes:    [name] M 07/10/15, 4:08 PM

## 2015-06-15 NOTE — Progress Notes (Signed)
[facility] PROGRESS NOTE  Holly Sparks WUJ:[id number] DOB: 05-11-1941 DOA: 06/13/2015 PCP: Milana Obey, MD  Assessment/Plan: #1 acute left MCA territory infarct Unkown etiology. Patient without expressive or receptive aphasia with some improvement since admission. 2-D echo with EF of 60-65% with no wall motion abnormalities and no cardiac source of emboli noted. Carotid Dopplers with no significant ICA stenosis. Fasting lipid panel with LDL of 70. Hemoglobin A1c 11.8. Continue  Lipitor to 20 mg daily.  PT/OT/ST. Patient had prior admission at [facility] for palpitations on 09/21/2003 and was noted to have acute onset of atrial fibrillation which spontaneously converted to normal sinus rhythm. Patient at that time was placed on diltiazem and metoprolol for rate control as well as aspirin. Monitor on telemetry. Patient currently on a dysphagia 1 diet. Patient for modified swallow today. Patient has been changed to eliquis due to remote history of atrial fibrillation rate for secondary stroke prevention. Continue risk factor modification. PT/OT/ST. Risk factor modification. Neurology following and appreciate input and recommendations.  #2 hypertension Permissive hypertension secondary to problem #1. Resume home dose Lopressor.  #3 hyperlipidemia Fasting lipid panel of 70. Increased home dose Lipitor to 20 mg daily. Follow.  #4 poorly controlled diabetes mellitus type 2 Hemoglobin A1c = 11.8. CBGs ranged from 121-225. Continue Lantus. Continue sliding scale insulin.  #5 elevated creatinine Likely secondary to prerenal azotemia. Improved with hydration. Follow.  #6 remote history of paroxysmal atrial fibrillation Patient with a remote history of paroxysmal atrial fibrillation was placed on Lopressor and Cardizem for rate control as well as aspirin. Patient with CHADS2VASC2 = 4-5. Patient now with an acute stroke. Patient has been started on ELIQUIS for anticoagulation per  neurology recommendation.  #7 abnormal EKG Patient asymptomatic. Cardiac enzymes negative 3. 2-D echo with EF of 60-65% with no wall motion abnormalities no cardiac source of emboli noted.  #8 prophylaxis Eliquis for DVT prophylaxis.  Code Status: Full Family Communication: Updated patient and daughter at bedside. Disposition Plan: Hopefully CIR when bed available, and approved by insurance.   Consultants:  Neurology: Dr. Amada Jupiter 06/13/2015  Procedures:  CT head 06/13/2015  MRI/ MRA head 06/13/2015  Carotid Dopplers 06/14/2015  2-D echo 06/14/2015  Antibiotics: None  HPI/Subjective: Patient denies any chest pain. No shortness of breath. Patient alert to self and place. Patient's expressive and receptive aphasia improving.   Objective: Filed Vitals:   06/15/15 1329  BP: 179/82  Pulse: 61  Temp: 98.3 F (36.8 C)  Resp: 20    Intake/Output Summary (Last 24 hours) at 06/15/15 1554 Last data filed at 06/15/15 0814  Gross per 24 hour  Intake    120 ml  Output      0 ml  Net    120 ml   Filed Weights   06/13/15 1018  Weight: 71.215 kg (157 lb)    Exam:   General:  NAD  Cardiovascular: RRR  Respiratory: CTAB anterior lung fields.  Abdomen: Soft, nontender, nondistended, positive bowel sounds.  Musculoskeletal: No clubbing cyanosis or edema. 5 out of 5 bilateral upper extremity strength. 5 out of 5 left lower extremity strength. 4 out of 5 right lower extremity strength.  Neurological: Patient with improving expressive and receptive aphasia. Right facial weakness.   Data Reviewed: Basic Metabolic Panel:  Recent Labs Lab 06/13/15 1026 06/13/15 1035 06/14/15 0230 06/15/15 0550  NA 137 138 136 138  K 4.6 4.5 4.0 3.8  CL 103 102 101 106  CO2 24  --  30  25  GLUCOSE 264* 264* 156* 117*  BUN 23* 25* 14 8  CREATININE 1.47* 1.20* 1.05* 0.92  CALCIUM 9.3  --  8.8* 8.7*   Liver Function Tests:  Recent Labs Lab 06/13/15 1026  AST 22  ALT  13*  ALKPHOS 71  BILITOT 0.7  PROT 6.1*  ALBUMIN 3.5   No results for input(s): LIPASE, AMYLASE in the last 168 hours. No results for input(s): AMMONIA in the last 168 hours. CBC:  Recent Labs Lab 06/13/15 1026 06/13/15 1035 06/15/15 0550  WBC 7.5  --  5.0  NEUTROABS 5.6  --   --   HGB 13.7 14.3 13.3  HCT 39.8 42.0 38.0  MCV 84.3  --  83.9  PLT 127*  --  123*   Cardiac Enzymes:  Recent Labs Lab 06/13/15 1026 06/13/15 1523 06/13/15 2045 06/14/15 0230  CKTOTAL 50  --   --   --   TROPONINI  --  <0.03 <0.03 <0.03   BNP (last 3 results) No results for input(s): BNP in the last 8760 hours.  ProBNP (last 3 results) No results for input(s): PROBNP in the last 8760 hours.  CBG:  Recent Labs Lab 06/14/15 2024 06/14/15 2354 06/15/15 0306 06/15/15 0734 06/15/15 1116  GLUCAP 138* 136* 125* 121* 225*    Recent Results (from the past 240 hour(s))  Urine culture     Status: None   Collection Time: 06/13/15 11:41 AM  Result Value Ref Range Status   Specimen Description URINE, CATHETERIZED  Final   Special Requests NONE  Final   Culture NO GROWTH 1 DAY  Final   Report Status 06/14/2015 FINAL  Final     Studies: Dg Chest 1 View  06/13/2015   CLINICAL DATA:  Possible aspiration  EXAM: CHEST  1 VIEW  COMPARISON:  12/29/2010  FINDINGS: Cardiac shadow is mildly enlarged. The lungs are well aerated bilaterally. No focal infiltrate or sizable effusion is seen. Postsurgical changes are noted in the cervical spine.  IMPRESSION: No active disease.   Electronically Signed   By: Alcide CleverMark  Lukens M.D.   On: 06/13/2015 18:32   Mr [name] Head Wo Contrast  06/13/2015   CLINICAL DATA:  74 year old female with acute onset abnormal speech and difficulty following commands. Initial encounter.  EXAM: MRI HEAD WITHOUT CONTRAST  MRA HEAD WITHOUT CONTRAST  TECHNIQUE: Multiplanar, multiecho pulse sequences of the brain and surrounding structures were obtained without intravenous contrast.  Angiographic images of the head were obtained using MRA technique without contrast.  COMPARISON:  Head CT without contrast 1056 hours today. Cervical spine MRI 12/15/2009.  FINDINGS: MRI HEAD FINDINGS  Scattered areas of restricted diffusion in the left MCA territory. The most confluent involvement is in the left basal ganglia. There is also left superior frontal gyrus pre motor cortex involvement. There is mild involvement in the left parietal lobe. There is also suggestion of left middle temporal gyrus involvement.  No contralateral or posterior fossa restricted diffusion. Major intracranial vascular flow voids are within normal limits.  Mild T2 and FLAIR hyperintensity in the acutely affected areas. There is petechial hemorrhage in the left basal ganglia. No significant mass effect. There is also a small focus of petechial hemorrhage or chronic micro hemorrhage in the left temporal lobe.  No midline shift, mass effect, evidence of mass lesion, ventriculomegaly, or extra-axial collection. Cervicomedullary junction and pituitary are within normal limits. Elsewhere gray and white matter signal is normal for age. Visible internal auditory structures appear normal. Trace retained  secretions in the nasopharynx. Mastoids are clear. Paranasal sinus mucosal thickening most pronounced in the sphenoid. Orbits soft tissues are within normal limits. Negative scalp soft tissues. Visualized cervical spine remarkable for partially visible ACDF hardware with mild susceptibility artifact.  MRA HEAD FINDINGS  Antegrade flow in the posterior circulation. Codominant distal vertebral arteries. Normal PICA origins. Normal vertebrobasilar junction. No basilar stenosis. Normal SCA and right PCA origins. Fetal type left PCA origin. Diminutive or absent right posterior communicating artery. Bilateral PCA branches are within normal limits.  Antegrade flow in both ICA siphons. No siphon stenosis. Ophthalmic and posterior communicating artery  origins are within normal limits. Carotid termini appear normal. MCA and ACA origins are within normal limits. Anterior communicating artery and visualized bilateral ACA branches are within normal limits. Right MCA M1 segment and branches are within normal limits.  Left MCA M1 segment and bifurcation are within normal limits. No major left MCA branch occlusion is identified.  IMPRESSION: 1. Acute left MCA territory infarct, most confluent involvement in the left basal ganglia. There is basal ganglia petechial hemorrhage, but no significant mass effect. 2. Negative intracranial MRA. No left MCA branch occlusion identified. 3. Aside from the acute findings, normal for age noncontrast MRI appearance of the brain.   Electronically Signed   By: Odessa Fleming M.D.   On: 06/13/2015 18:21   Mr Laqueta Jean ZO Contrast  06/13/2015   CLINICAL DATA:  74 year old female with acute onset abnormal speech and difficulty following commands. Initial encounter.  EXAM: MRI HEAD WITHOUT CONTRAST  MRA HEAD WITHOUT CONTRAST  TECHNIQUE: Multiplanar, multiecho pulse sequences of the brain and surrounding structures were obtained without intravenous contrast. Angiographic images of the head were obtained using MRA technique without contrast.  COMPARISON:  Head CT without contrast 1056 hours today. Cervical spine MRI 12/15/2009.  FINDINGS: MRI HEAD FINDINGS  Scattered areas of restricted diffusion in the left MCA territory. The most confluent involvement is in the left basal ganglia. There is also left superior frontal gyrus pre motor cortex involvement. There is mild involvement in the left parietal lobe. There is also suggestion of left middle temporal gyrus involvement.  No contralateral or posterior fossa restricted diffusion. Major intracranial vascular flow voids are within normal limits.  Mild T2 and FLAIR hyperintensity in the acutely affected areas. There is petechial hemorrhage in the left basal ganglia. No significant mass effect. There  is also a small focus of petechial hemorrhage or chronic micro hemorrhage in the left temporal lobe.  No midline shift, mass effect, evidence of mass lesion, ventriculomegaly, or extra-axial collection. Cervicomedullary junction and pituitary are within normal limits. Elsewhere gray and white matter signal is normal for age. Visible internal auditory structures appear normal. Trace retained secretions in the nasopharynx. Mastoids are clear. Paranasal sinus mucosal thickening most pronounced in the sphenoid. Orbits soft tissues are within normal limits. Negative scalp soft tissues. Visualized cervical spine remarkable for partially visible ACDF hardware with mild susceptibility artifact.  MRA HEAD FINDINGS  Antegrade flow in the posterior circulation. Codominant distal vertebral arteries. Normal PICA origins. Normal vertebrobasilar junction. No basilar stenosis. Normal SCA and right PCA origins. Fetal type left PCA origin. Diminutive or absent right posterior communicating artery. Bilateral PCA branches are within normal limits.  Antegrade flow in both ICA siphons. No siphon stenosis. Ophthalmic and posterior communicating artery origins are within normal limits. Carotid termini appear normal. MCA and ACA origins are within normal limits. Anterior communicating artery and visualized bilateral ACA branches are within normal  limits. Right MCA M1 segment and branches are within normal limits.  Left MCA M1 segment and bifurcation are within normal limits. No major left MCA branch occlusion is identified.  IMPRESSION: 1. Acute left MCA territory infarct, most confluent involvement in the left basal ganglia. There is basal ganglia petechial hemorrhage, but no significant mass effect. 2. Negative intracranial MRA. No left MCA branch occlusion identified. 3. Aside from the acute findings, normal for age noncontrast MRI appearance of the brain.   Electronically Signed   By: Odessa Fleming M.D.   On: 06/13/2015 18:21   Dg  Swallowing Func-speech Pathology  06/15/2015    Objective Swallowing Evaluation:   MBS Patient Details  Name: Holly Sparks MRN: [id number] Date of Birth: January 07, 1941  Today's Date: 06/15/2015 Time: SLP Start Time (ACUTE ONLY): 0953-SLP Stop Time (ACUTE ONLY): 1015 SLP Time Calculation (min) (ACUTE ONLY): 22 min  Past Medical History:  Past Medical History  Diagnosis Date  . Hypertension     x 10 yrs  . 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 High cholesterol   . Presence of permanent cardiac pacemaker   . Paroxysmal a-fib: Remote hx of afib 06/15/2015   Past Surgical History:  Past Surgical History  Procedure Laterality Date  . Neck surgery      x 2 for spur and disc problems  . Colonoscopy    . Colonoscopy N/A 05/08/2013    Procedure: COLONOSCOPY;  Surgeon: Malissa Hippo, MD;  Location: [facility];  Service: Endoscopy;  Laterality: N/A;  1015   HPI:  Other Pertinent Information: Holly Sparks is a 74 y.o. female, with  hypertension, hyperlipidemia, diabetes and recently an irregular  heartbeat, presented to ER with difficulty speaking. Diagnosed with left  MCA CVA.   No Data Recorded  Assessment / Plan / Recommendation CHL IP CLINICAL IMPRESSIONS 06/15/2015  Therapy Diagnosis Mild oral phase dysphagia;Mild pharyngeal phase  dysphagia  Clinical Impression Patient presents with a mild sensory motor based  oropharyngeal dysphagia. Patient with a mildly delayed swallow initiation  and decreased laryngeal closure resulting in silent aspiration of thin  liquids. Cueing for chin tuck reduced aspiration to penetration, right  head turn prevented aspiration during todays study however patient  required moderate cues for accurate usage. Mild pharyngeal residuals noted  post swallow may be dur to oro-pharyngeal weakness and/or presence of  cervical hardware at C3-5. Residuals cleared with spontaneous dry swallows  and did not significantly impact overall function. Given communication and  cognitive deficits impacting  carryover of newly learned information,  recommend continuing use of thickened liquids with therapeutic trials of  upgraded liquids at bedside.       CHL IP TREATMENT RECOMMENDATION 06/15/2015  Treatment Recommendations Therapy as outlined in treatment plan below     CHL IP DIET RECOMMENDATION 06/15/2015  SLP Diet Recommendations Dysphagia 3 (Mech soft);Nectar  Liquid Administration via (None)  Medication Administration Whole meds with puree  Compensations Slow rate;Small sips/bites  Postural Changes and/or Swallow Maneuvers (None)     CHL IP OTHER RECOMMENDATIONS 06/15/2015  Recommended Consults (None)  Oral Care Recommendations Oral care BID  Other Recommendations Order thickener from pharmacy;Prohibited food  (jello, ice cream, thin soups);Remove water pitcher     No flowsheet data found.   CHL IP FREQUENCY AND DURATION 06/15/2015  Speech Therapy Frequency (ACUTE ONLY) min 2x/week  Treatment Duration 2 weeks           Ferdinand Lango MA,  CCC-SLP [phone]        McCoy Leah Meryl 06/15/2015, 11:22 AM     Scheduled Meds: . apixaban  5 mg Oral BID  . atorvastatin  20 mg Oral QHS  . insulin aspart  0-9 Units Subcutaneous 6 times per day  . insulin glargine  10 Units Subcutaneous QHS  . pantoprazole  40 mg Oral Daily   Continuous Infusions:    Principal Problem:   Cerebral infarction due to unspecified mechanism Active Problems:   IBS (irritable bowel syndrome)   High cholesterol   Diabetes   Stroke-like symptoms   HLD (hyperlipidemia)   DDD (degenerative disc disease), cervical   Elevated serum creatinine   Aphasia   Expressive aphasia   Type 2 diabetes mellitus without complication   Paroxysmal a-fib: Remote hx of afib    Time spent: 56 MINS    [facility] MD [facility] Pager [phone]. If 7PM-7AM, please contact night-coverage at [email or web address], password [facility] 06/15/2015, 3:54 PM  LOS: 2 days

## 2015-06-15 NOTE — Consult Note (Signed)
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 right handed female with history of hypertension, diabetes mellitus with peripheral neuropathy as well as history of arrhythmia/atrial fibrillation seen by [facility] cardiology in the past maintained on Cardizem and metoprolol as well as aspirin. Independent prior to admission living with her husband. Presented 06/13/2015 with speech difficulties and inability to follow commands. Cranial CT scan negative. EKG showed T-wave inversions. Troponin negative. Patient did not receive TPA. MRI of the brain showed acute left MCA territory infarcts most confluent involvement in the left basal ganglia. MRA of the head negative. Echocardiogram with ejection fraction of 65% grade 1 diastolic dysfunction. Carotid Dopplers with no ICA stenosis. Neurology services consulted presently maintained on Eliquis CVA prophylaxis. Dysphagia 1 nectar liquids. Physical therapy evaluation completed 06/14/2015 with recommendations of physical medicine rehabilitation consult.   Review of Systems  Constitutional: Negative for fever and chills.  HENT: Negative for hearing loss.   Eyes: Negative for blurred vision.  Respiratory: Negative for cough and shortness of breath.   Cardiovascular: Positive for palpitations.  Gastrointestinal: Positive for heartburn and constipation.  Musculoskeletal: Positive for myalgias and joint pain.  Skin: Negative for rash.  Neurological: Positive for speech change and headaches. Negative for loss of consciousness.  Psychiatric/Behavioral:       Anxiety   Past Medical History  Diagnosis Date  . Hypertension     x 10 yrs  . 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 High cholesterol   . Presence of permanent cardiac pacemaker    Past Surgical History  Procedure Laterality Date  . Neck surgery      x 2 for  spur and disc problems  . Colonoscopy    . Colonoscopy N/A 05/08/2013    Procedure: COLONOSCOPY;  Surgeon: Malissa Hippo, MD;  Location: [facility];  Service: Endoscopy;  Laterality: N/A;  1015   History reviewed. No pertinent family history. Social History:  reports that she has never smoked. She does not have any smokeless tobacco history on file. She reports that she does not drink alcohol or use illicit drugs. Allergies:  Allergies  Allergen Reactions  . Prednisone Nausea And Vomiting   Medications Prior to Admission  Medication Sig Dispense Refill  . aspirin EC 81 MG tablet Take 81 mg by mouth daily.    Marland Kitchen atorvastatin (LIPITOR) 10 MG tablet Take 10 mg by mouth at bedtime.     . Cinnamon 500 MG capsule Take 2,000 mg by mouth daily.    . hyoscyamine (LEVSIN SL) 0.125 MG SL tablet Place 1 tablet (0.125 mg total) under the tongue every 4 (four) hours as needed for cramping. (Patient taking differently: Place 0.125 mg under the tongue every 4 (four) hours as needed (pain from IBS). ) 60 tablet 1  . lisinopril-hydrochlorothiazide (PRINZIDE,ZESTORETIC) 20-25 MG per tablet Take 1 tablet by mouth daily. For high blood pressure    . LORazepam (ATIVAN) 0.5 MG tablet Take 0.5 mg by mouth 4 (four) times daily as needed for anxiety.     . Menthol, Topical Analgesic, (BIOFREEZE) 4 % GEL Apply 1 application topically daily as needed (leg pain).    . naproxen sodium (ANAPROX) 220 MG tablet Take 440 mg by mouth daily as needed (for pain).    . pantoprazole (PROTONIX) 40 MG tablet Take 40 mg by mouth daily.    Marland Kitchen  metFORMIN (GLUCOPHAGE) 500 MG tablet Take 500 mg by mouth 2 (two) times daily with a meal.    . metoprolol (LOPRESSOR) 50 MG tablet Take 12.5 mg by mouth daily.       Home: Home Living Family/patient expects to be discharged to:: Private residence Living Arrangements: Spouse/significant other Available Help at Discharge: Family, Available 24 hours/day (Husband is 39.  Able to provide  supervision) Type of Home: House Home Access: Stairs to enter Entrance Stairs-Number of Steps: 1 Entrance Stairs-Rails: None Home Layout: Two level, Able to live on main level with bedroom/bathroom Home Equipment: None Additional Comments: Information provided primarily by daughter  Functional History: Prior Function Level of Independence: Independent Comments: Driving Functional Status:  Mobility: Bed Mobility Overal bed mobility: Needs Assistance Bed Mobility: Rolling, Sidelying to Sit, Sit to Supine Rolling: Min assist Sidelying to sit: Mod assist Sit to supine: Min assist General bed mobility comments: Verbal and tactile cues for technique. Assist to raise trunk to sitting.  Used bed pad to assist with scooting to EOB.  Once upright, patient required min assist for balance.  Assist to bring LE's onto bed to return to supine. Transfers Overall transfer level: Needs assistance Equipment used: 1 person hand held assist Transfers: Sit to/from Stand Sit to Stand: Mod assist General transfer comment: Assist to rise to standing and for balance in stance. Ambulation/Gait Ambulation/Gait assistance: Mod assist Ambulation Distance (Feet): 2 Feet Assistive device: 1 person hand held assist Gait Pattern/deviations: Step-to pattern, Decreased stride length, Shuffle General Gait Details: Patient able to take 2 steps forward.  Gait very unsteady with decreased stability of RLE in stance.  Required mod assist for balance.    ADL:    Cognition: Cognition Overall Cognitive Status: Difficult to assess Orientation Level: Oriented to person Cognition Arousal/Alertness: Awake/alert Behavior During Therapy: Flat affect Overall Cognitive Status: Difficult to assess Area of Impairment: Following commands, Problem solving Following Commands: Follows one step commands inconsistently, Follows one step commands with increased time Problem Solving: Slow processing, Decreased initiation,  Difficulty sequencing, Requires tactile cues Difficult to assess due to: Impaired communication  Blood pressure 141/64, pulse 52, temperature 98.4 F (36.9 C), temperature source Tympanic, resp. rate 18, height 5\' 6"  (1.676 m), weight 71.215 kg (157 lb), SpO2 96 %. Physical Exam  Constitutional: She appears well-developed.  HENT:  Head: Normocephalic.  Eyes: EOM are normal.  Neck: Normal range of motion. Neck supple. No thyromegaly present.  Cardiovascular:  Cardiac rate control  Respiratory: Effort normal and breath sounds normal. No respiratory distress.  GI: Soft. Bowel sounds are normal. She exhibits no distension.  Neurological: She is alert.   Makes good eye contact with examiner. Patient with word finding deficits, but is able to id simple objects and provide biographical information with cues. Does perseverate on certain words/ideas. RUE: 4- delt, bicep, tricep, wrist and hi. LUE: 5/5 prox to distal. Right pronator drift. RLE: 3/5 hf, 3+ke and 4/5 ankle. LLE 4/5 prox to 5/5 distal. Senses pain and LT in all 4's. Mild right central 7 and tongue deviation. Very alert  Skin: Skin is warm and dry.  Psychiatric: She has a normal mood and affect. Her behavior is normal.    Results for orders placed or performed during the hospital encounter of 06/13/15 (from the past 24 hour(s))  Glucose, capillary     Status: Abnormal   Collection Time: 06/14/15  6:33 AM  Result Value Ref Range   Glucose-Capillary 155 (H) 65 - 99 mg/dL  Comment 1 Notify RN    Comment 2 Document in Chart   Glucose, capillary     Status: Abnormal   Collection Time: 06/14/15 11:22 AM  Result Value Ref Range   Glucose-Capillary 148 (H) 65 - 99 mg/dL  Glucose, capillary     Status: Abnormal   Collection Time: 06/14/15  4:31 PM  Result Value Ref Range   Glucose-Capillary 115 (H) 65 - 99 mg/dL  Glucose, capillary     Status: Abnormal   Collection Time: 06/14/15  8:24 PM  Result Value Ref Range   Glucose-Capillary  138 (H) 65 - 99 mg/dL   Comment 1 Notify RN    Comment 2 Document in Chart   Glucose, capillary     Status: Abnormal   Collection Time: 06/14/15 11:54 PM  Result Value Ref Range   Glucose-Capillary 136 (H) 65 - 99 mg/dL   Comment 1 Notify RN    Comment 2 Document in Chart   Glucose, capillary     Status: Abnormal   Collection Time: 06/15/15  3:06 AM  Result Value Ref Range   Glucose-Capillary 125 (H) 65 - 99 mg/dL   Comment 1 Notify RN    Comment 2 Document in Chart    Dg Chest 1 View  06/13/2015   CLINICAL DATA:  Possible aspiration  EXAM: CHEST  1 VIEW  COMPARISON:  12/29/2010  FINDINGS: Cardiac shadow is mildly enlarged. The lungs are well aerated bilaterally. No focal infiltrate or sizable effusion is seen. Postsurgical changes are noted in the cervical spine.  IMPRESSION: No active disease.   Electronically Signed   By: Alcide Clever M.D.   On: 06/13/2015 18:32   Ct Head Wo Contrast  06/13/2015   CLINICAL DATA:  Found unresponsive.  EXAM: CT HEAD WITHOUT CONTRAST  TECHNIQUE: Contiguous axial images were obtained from the base of the skull through the vertex without intravenous contrast.  COMPARISON:  12/11/2009  FINDINGS: Prominence of the sulci and ventricles identified consistent with brain atrophy. There is mild low attenuation within the subcortical and periventricular white matter. There is no acute brain infarct, intracranial hemorrhage or mass identified. The paranasal stress set there is partial opacification of the sphenoid sinus. The remaining paranasal sinuses are clear. The calvarium appears to be intact.  IMPRESSION: 1. No acute intracranial abnormalities. 2. Brain atrophy and mild chronic microvascular disease.   Electronically Signed   By: Signa Kell M.D.   On: 06/13/2015 11:13   Mr Maxine Glenn Head Wo Contrast  06/13/2015   CLINICAL DATA:  74 year old female with acute onset abnormal speech and difficulty following commands. Initial encounter.  EXAM: MRI HEAD WITHOUT CONTRAST   MRA HEAD WITHOUT CONTRAST  TECHNIQUE: Multiplanar, multiecho pulse sequences of the brain and surrounding structures were obtained without intravenous contrast. Angiographic images of the head were obtained using MRA technique without contrast.  COMPARISON:  Head CT without contrast 1056 hours today. Cervical spine MRI 12/15/2009.  FINDINGS: MRI HEAD FINDINGS  Scattered areas of restricted diffusion in the left MCA territory. The most confluent involvement is in the left basal ganglia. There is also left superior frontal gyrus pre motor cortex involvement. There is mild involvement in the left parietal lobe. There is also suggestion of left middle temporal gyrus involvement.  No contralateral or posterior fossa restricted diffusion. Major intracranial vascular flow voids are within normal limits.  Mild T2 and FLAIR hyperintensity in the acutely affected areas. There is petechial hemorrhage in the left basal ganglia. No significant mass  effect. There is also a small focus of petechial hemorrhage or chronic micro hemorrhage in the left temporal lobe.  No midline shift, mass effect, evidence of mass lesion, ventriculomegaly, or extra-axial collection. Cervicomedullary junction and pituitary are within normal limits. Elsewhere gray and white matter signal is normal for age. Visible internal auditory structures appear normal. Trace retained secretions in the nasopharynx. Mastoids are clear. Paranasal sinus mucosal thickening most pronounced in the sphenoid. Orbits soft tissues are within normal limits. Negative scalp soft tissues. Visualized cervical spine remarkable for partially visible ACDF hardware with mild susceptibility artifact.  MRA HEAD FINDINGS  Antegrade flow in the posterior circulation. Codominant distal vertebral arteries. Normal PICA origins. Normal vertebrobasilar junction. No basilar stenosis. Normal SCA and right PCA origins. Fetal type left PCA origin. Diminutive or absent right posterior  communicating artery. Bilateral PCA branches are within normal limits.  Antegrade flow in both ICA siphons. No siphon stenosis. Ophthalmic and posterior communicating artery origins are within normal limits. Carotid termini appear normal. MCA and ACA origins are within normal limits. Anterior communicating artery and visualized bilateral ACA branches are within normal limits. Right MCA M1 segment and branches are within normal limits.  Left MCA M1 segment and bifurcation are within normal limits. No major left MCA branch occlusion is identified.  IMPRESSION: 1. Acute left MCA territory infarct, most confluent involvement in the left basal ganglia. There is basal ganglia petechial hemorrhage, but no significant mass effect. 2. Negative intracranial MRA. No left MCA branch occlusion identified. 3. Aside from the acute findings, normal for age noncontrast MRI appearance of the brain.   Electronically Signed   By: Odessa FlemingH  Hall M.D.   On: 06/13/2015 18:21   Mr Laqueta JeanBrain W BJWo Contrast  06/13/2015   CLINICAL DATA:  74 year old female with acute onset abnormal speech and difficulty following commands. Initial encounter.  EXAM: MRI HEAD WITHOUT CONTRAST  MRA HEAD WITHOUT CONTRAST  TECHNIQUE: Multiplanar, multiecho pulse sequences of the brain and surrounding structures were obtained without intravenous contrast. Angiographic images of the head were obtained using MRA technique without contrast.  COMPARISON:  Head CT without contrast 1056 hours today. Cervical spine MRI 12/15/2009.  FINDINGS: MRI HEAD FINDINGS  Scattered areas of restricted diffusion in the left MCA territory. The most confluent involvement is in the left basal ganglia. There is also left superior frontal gyrus pre motor cortex involvement. There is mild involvement in the left parietal lobe. There is also suggestion of left middle temporal gyrus involvement.  No contralateral or posterior fossa restricted diffusion. Major intracranial vascular flow voids are  within normal limits.  Mild T2 and FLAIR hyperintensity in the acutely affected areas. There is petechial hemorrhage in the left basal ganglia. No significant mass effect. There is also a small focus of petechial hemorrhage or chronic micro hemorrhage in the left temporal lobe.  No midline shift, mass effect, evidence of mass lesion, ventriculomegaly, or extra-axial collection. Cervicomedullary junction and pituitary are within normal limits. Elsewhere gray and white matter signal is normal for age. Visible internal auditory structures appear normal. Trace retained secretions in the nasopharynx. Mastoids are clear. Paranasal sinus mucosal thickening most pronounced in the sphenoid. Orbits soft tissues are within normal limits. Negative scalp soft tissues. Visualized cervical spine remarkable for partially visible ACDF hardware with mild susceptibility artifact.  MRA HEAD FINDINGS  Antegrade flow in the posterior circulation. Codominant distal vertebral arteries. Normal PICA origins. Normal vertebrobasilar junction. No basilar stenosis. Normal SCA and right PCA origins. Fetal type left  PCA origin. Diminutive or absent right posterior communicating artery. Bilateral PCA branches are within normal limits.  Antegrade flow in both ICA siphons. No siphon stenosis. Ophthalmic and posterior communicating artery origins are within normal limits. Carotid termini appear normal. MCA and ACA origins are within normal limits. Anterior communicating artery and visualized bilateral ACA branches are within normal limits. Right MCA M1 segment and branches are within normal limits.  Left MCA M1 segment and bifurcation are within normal limits. No major left MCA branch occlusion is identified.  IMPRESSION: 1. Acute left MCA territory infarct, most confluent involvement in the left basal ganglia. There is basal ganglia petechial hemorrhage, but no significant mass effect. 2. Negative intracranial MRA. No left MCA branch occlusion  identified. 3. Aside from the acute findings, normal for age noncontrast MRI appearance of the brain.   Electronically Signed   By: Odessa Fleming M.D.   On: 06/13/2015 18:21    Assessment/Plan: Diagnosis: cardioembolic left MCA infarct 1. Does the need for close, 24 hr/day medical supervision in concert with the patient's rehab needs make it unreasonable for this patient to be served in a less intensive setting? Yes 2. Co-Morbidities requiring supervision/potential complications: dm, afib, htn 3. Due to bladder management, bowel management, safety, skin/wound care, disease management, medication administration, pain management and patient education, does the patient require 24 hr/day rehab nursing? Yes 4. Does the patient require coordinated care of a physician, rehab nurse, PT (1-2 hrs/day, 5 days/week), OT (1-2 hrs/day, 5 days/week) and SLP (1-2 hrs/day, 5 days/week) to address physical and functional deficits in the context of the above medical diagnosis(es)? Yes Addressing deficits in the following areas: balance, endurance, locomotion, strength, transferring, bowel/bladder control, bathing, dressing, feeding, grooming, toileting, cognition, speech, language, swallowing and psychosocial support 5. Can the patient actively participate in an intensive therapy program of at least 3 hrs of therapy per day at least 5 days per week? Yes 6. The potential for patient to make measurable gains while on inpatient rehab is excellent 7. Anticipated functional outcomes upon discharge from inpatient rehab are modified independent  with PT, modified independent and supervision with OT, supervision with SLP. 8. Estimated rehab length of stay to reach the above functional goals is: 10-14 days 9. Does the patient have adequate social supports and living environment to accommodate these discharge functional goals? Yes 10. Anticipated D/C setting: Home 11. Anticipated post D/C treatments: HH therapy and Outpatient  therapy 12. Overall Rehab/Functional Prognosis: excellent  RECOMMENDATIONS: This patient's condition is appropriate for continued rehabilitative care in the following setting: CIR Patient has agreed to participate in recommended program. Yes Note that insurance prior authorization may be required for reimbursement for recommended care.  Comment: Rehab Admissions Coordinator to follow up.  Thanks,  Ranelle Oyster, MD, [place] [facility]     06/15/2015

## 2015-06-15 NOTE — Progress Notes (Signed)
MBSS complete. Full report located under chart review in imaging section.  Samyuktha Brau MA, CCC-SLP [phone]   

## 2015-06-15 NOTE — Discharge Instructions (Signed)
Information on my medicine - ELIQUIS® (apixaban) ° °This medication education was reviewed with me or my healthcare representative as part of my discharge preparation.  ° °Why was Eliquis® prescribed for you? °Eliquis® was prescribed for you to reduce the risk of a blood clot forming that can cause a stroke if you have a medical condition called atrial fibrillation (a type of irregular heartbeat). ° °What do You need to know about Eliquis® ? °Take your Eliquis® 5mg TWICE DAILY - one tablet in the morning and one tablet in the evening with or without food. If you have difficulty swallowing the tablet whole please discuss with your pharmacist how to take the medication safely. ° °Take Eliquis® exactly as prescribed by your doctor and DO NOT stop taking Eliquis® without talking to the doctor who prescribed the medication.  Stopping may increase your risk of developing a stroke.  Refill your prescription before you run out. ° °After discharge, you should have regular check-up appointments with your healthcare provider that is prescribing your Eliquis®.  In the future your dose may need to be changed if your kidney function or weight changes by a significant amount or as you get older. ° °What do you do if you miss a dose? °If you miss a dose, take it as soon as you remember on the same day and resume taking twice daily.  Do not take more than one dose of ELIQUIS at the same time to make up a missed dose. ° °Important Safety Information °A possible side effect of Eliquis® is bleeding. You should call your healthcare provider right away if you experience any of the following: °? Bleeding from an injury or your nose that does not stop. °? Unusual colored urine (red or dark brown) or unusual colored stools (red or black). °? Unusual bruising for unknown reasons. °? A serious fall or if you hit your head (even if there is no bleeding). ° °Some medicines may interact with Eliquis® and might increase your risk of bleeding or  clotting while on Eliquis®. To help avoid this, consult your healthcare provider or pharmacist prior to using any new prescription or non-prescription medications, including herbals, vitamins, non-steroidal anti-inflammatory drugs (NSAIDs) and supplements. ° °This website has more information on Eliquis® (apixaban): http://www.eliquis.com/eliquis/home ° °

## 2015-06-15 NOTE — Progress Notes (Signed)
STROKE TEAM PROGRESS NOTE   HISTORY Holly Sparks is a 74 y.o. female with a history of diabetes mellitus, hypertension and hyperlipidemia who takes aspirin 81 mg on a daily basis. She was at a church retreat Friday night and was last seen well at approximately 10 PM. Today an approximate 7:30 AM she was found to have aphasia with inability to follow most commands. She was brought to the emergency room where a CT scan of the head was negative for acute findings. She has been admitted for a full stroke workup.  A review of her old records revealed an admission 09/21/2003 at [facility] for palpitations. She was noted to have acute onset of atrial fibrillation which later spontaneously converted to sinus rhythm. She was evaluated by [facility] and placed on diltiazem, metoprolol and aspirin 325 mg daily .The family was not aware of any history of irregular rhythms. An EKG at the time of this admission showed sinus rhythm.  Date last known well: Date: 06/12/2015 Time last known well: Unable to determine tPA Given: No: Late presentation and unknown time of onset   SUBJECTIVE (INTERVAL HISTORY) The patient's daughter is present today. Speech continues to improve. Daughter understands need for rehab. Consult pending. On eliquis.  OBJECTIVE Temp:  [97.5 F (36.4 C)-98.7 F (37.1 C)] 98.2 F (36.8 C) (07/18 0939) Pulse Rate:  [52-61] 60 (07/18 0939) Cardiac Rhythm:  [-] Sinus bradycardia;Normal sinus rhythm (07/17 2000) Resp:  [16-20] 20 (07/18 0939) BP: (141-186)/(62-77) 186/69 mmHg (07/18 0939) SpO2:  [96 %-99 %] 99 % (07/18 0939)   Recent Labs Lab 06/14/15 1631 06/14/15 2024 06/14/15 2354 06/15/15 0306 06/15/15 0734  GLUCAP 115* 138* 136* 125* 121*    Recent Labs Lab 06/13/15 1026 06/13/15 1035 06/14/15 0230 06/15/15 0550  NA 137 138 136 138  K 4.6 4.5 4.0 3.8  CL 103 102 101 106  CO2 24  --  30 25  GLUCOSE 264* 264* 156* 117*  BUN 23* 25* 14 8   CREATININE 1.47* 1.20* 1.05* 0.92  CALCIUM 9.3  --  8.8* 8.7*    Recent Labs Lab 06/13/15 1026  AST 22  ALT 13*  ALKPHOS 71  BILITOT 0.7  PROT 6.1*  ALBUMIN 3.5    Recent Labs Lab 06/13/15 1026 06/13/15 1035 06/15/15 0550  WBC 7.5  --  5.0  NEUTROABS 5.6  --   --   HGB 13.7 14.3 13.3  HCT 39.8 42.0 38.0  MCV 84.3  --  83.9  PLT 127*  --  123*    Recent Labs Lab 06/13/15 1026 06/13/15 1523 06/13/15 2045 06/14/15 0230  CKTOTAL 50  --   --   --   TROPONINI  --  <0.03 <0.03 <0.03    Recent Labs  06/13/15 1026  LABPROT 14.5  INR 1.12    Recent Labs  06/13/15 1035 06/13/15 1141  COLORURINE YELLOW YELLOW  LABSPEC 1.026 1.023  PHURINE 5.0 5.5  GLUCOSEU >1000* >1000*  HGBUR NEGATIVE NEGATIVE  BILIRUBINUR NEGATIVE NEGATIVE  KETONESUR 15* 15*  PROTEINUR NEGATIVE NEGATIVE  UROBILINOGEN 0.2 0.2  NITRITE NEGATIVE NEGATIVE  LEUKOCYTESUR SMALL* NEGATIVE       Component Value Date/Time   CHOL 135 06/14/2015 0230   TRIG 185* 06/14/2015 0230   HDL 28* 06/14/2015 0230   CHOLHDL 4.8 06/14/2015 0230   VLDL 37 06/14/2015 0230   LDLCALC 70 06/14/2015 0230   Lab Results  Component Value Date   HGBA1C 11.8* 06/14/2015  Component Value Date/Time   LABOPIA NONE DETECTED 06/13/2015 1035   COCAINSCRNUR NONE DETECTED 06/13/2015 1035   LABBENZ POSITIVE* 06/13/2015 1035   AMPHETMU NONE DETECTED 06/13/2015 1035   THCU NONE DETECTED 06/13/2015 1035   LABBARB NONE DETECTED 06/13/2015 1035     Recent Labs Lab 06/13/15 1026  ETH <5    Dg Chest 1 View 06/13/2015    No active disease.    Ct Head Wo Contrast 06/13/2015    1. No acute intracranial abnormalities.  2. Brain atrophy and mild chronic microvascular disease.     Mri & Mra Head Wo Contrast 06/13/2015    1. Acute left MCA territory infarct, most confluent involvement in the left basal ganglia. There is basal ganglia petechial hemorrhage, but no significant mass effect.  2. Negative intracranial  MRA. No left MCA branch occlusion identified.  3. Aside from the acute findings, normal for age noncontrast MRI appearance of the brain.     Carotid Doppler  There is 1-39% bilateral ICA stenosis. Vertebral artery flow is antegrade.    2D Echocardiogram   - Left ventricle: The cavity size was normal. There was moderateconcentric hypertrophy. Systolic function was normal. Theestimated ejection fraction was in the range of 60% to 65%. Wallmotion was normal; there were no regional wall motionabnormalities. There was an increased relative contribution ofatrial contraction to ventricular filling. Doppler parameters areconsistent with abnormal left ventricular relaxation (grade 1diastolic dysfunction). - Mitral valve: There was trivial regurgitation.   PHYSICAL EXAM Pleasant elderly Caucasian lady not in distress. . Afebrile. Head is nontraumatic. Neck is supple without bruit.    Cardiac exam no murmur or gallop. Lungs are clear to auscultation. Distal pulses are well felt. Neurological Exam : Awake alert globally aphasic expressive greater than receptive. Can speak occasional words and guttural sounds. Follows only simple midline and few 1 step commands only. Unable to name or repeat. Extraocular moments are full range without nystagmus. Pupils are equal reactive. Fundi were not visualized. Vision acuity seems adequate bilaterally. Visual fields seem full to bedside confrontational testing. Face is asymmetric with moderate right lower facial weakness. Tongue is midline. Motor system exam reveals no upper extremity drift. Fine finger movements are diminished on the right orbits left over right upper extremity. Mild weakness of right hip flexor and ankle dorsiflexors only but no drift. Touch and pinprick sensation seems preserved bilaterally. Plantars are downgoing. Deep tendon reflexes are symmetric. Gait was not tested.   ASSESSMENT/PLAN Holly Sparks is a 74 y.o. female with history of  diabetes mellitus, hypertension, IBS, hyperlipidemia, and remote atrial fibrillation not anticoagulated presenting with expressive and receptive aphasia. She did not receive IV t-PAdue to late presentation.   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  Resultant  expressive and receptive aphasia  MRI  Acute left MCA territory infarct, most confluent involvement in the left basal ganglia. There is basal ganglia petechial hemorrhage.  MRA  negative  Carotid Doppler  1-39% ICA stenosis. Vertebral artery flow is antegrade.  2D Echo  No source of embolus  LDL 70  HgbA1c 11.8  Lovenox for VTE prophylaxis DIET - DYS 1 Room service appropriate?: Yes; Fluid consistency:: Nectar Thick  aspirin 81 mg orally every day prior to admission, now on eliquis (apixaban)  Ongoing aggressive stroke risk factor management  Therapy recommendations:  CIR - rehabilitation M.D. consult ordered and pending.  Disposition:  Pending NOTHING FURTHER TO ADD FROM THE STROKE STANDPOINT Patient has  a 10-15% risk of having another stroke over the next year, the highest risk is within 2 weeks of the most recent stroke/TIA (risk of having a stroke following a stroke or TIA is the same). Ongoing risk factor control by Primary Care Physician Stroke Service will sign off. Please call should any needs arise. Follow-up Stroke Clinic at [facility] with Dr. Delia Heady in 2 months, order placed.  Afib  Likely the cause of this embolic stroke  Put on eliquis  Tolerated well  On metoprolol for rate control.  Hypertension  Home meds: Lisinopril, hydrochlorothiazide and metoprolol Permissive hypertension (OK if <220/120) for 24-48 hours post stroke and then gradually normalized within 5-7 days.  Stable  Hyperlipidemia  Home meds: Lipitor 10 mg daily resumed in hospital  LDL 70, goal < 70  Continue statin at discharge  Diabetes  HgbA1c 11.8,  goal < 7.0  Uncontrolled  DM education  Management per primary team  Other Stroke Risk Factors  Advanced age  Other Active Problems  Mild renal insufficiency  Hospital day # 2  Neurology will sign off. Please call with questions. Pt will follow up with Dr. Pearlean Brownie at [facility] in about 2 months. Thanks for the consult.  Marvel Plan, MD PhD Stroke Neurology 06/15/2015 9:34 PM    To contact Stroke Continuity provider, please refer to [email or web address]. After hours, contact General Neurology

## 2015-06-15 NOTE — Progress Notes (Signed)
Received prescreen request for inpatient rehab from PT and noted that rehab consult order has already been placed. Genie, rehab admission coordinator will follow up after rehab consult is completed. She can be reached at [phone]. Thanks.  Juliann MuleJanine Kathee Tumlin, PT Rehabilitation Admissions Coordinator [phone]

## 2015-06-15 NOTE — Progress Notes (Signed)
Physical Therapy Treatment Patient Details Name: Holly Sparks MRN: [id number] DOB: 12-09-1940 Today's Date: 06/15/2015    History of Present Illness Patient is a 74 yo female admitted 06/13/15 with inability to speak or follow commands.  PMH:  DM, HTN, Afib    PT Comments    Pt is progressing towards goals. Pt was able to walk 250 with RW and Min A with no fatigue. Pt still show signs of mental and physical impairments. Pt demonstrated impaired problem solving, perseveration and right side neglect. Continue to recommend CIR upon DC for max functional recovery.  Follow Up Recommendations  CIR;Supervision/Assistance - 24 hour     Equipment Recommendations  Rolling walker with 5" wheels    Recommendations for Other Services Rehab consult     Precautions / Restrictions Precautions Precautions: Fall Restrictions Weight Bearing Restrictions: No    Mobility  Bed Mobility               General bed mobility comments: Pt recieved in chair  Transfers Overall transfer level: Needs assistance Equipment used: Rolling walker (2 wheeled) Transfers: Sit to/from Stand Sit to Stand: Min assist         General transfer comment: pt required vc for proper hand placement and forward bending for proper sit to stand technique.  Ambulation/Gait Ambulation/Gait assistance: Min assist Ambulation Distance (Feet): 250 Feet Assistive device: Rolling walker (2 wheeled) Gait Pattern/deviations: Step-through pattern;Decreased stride length;Narrow base of support Gait velocity: slow Gait velocity interpretation: Below normal speed for age/gender General Gait Details: pt was able to ambulate 250 ft however required min vc to increase step lenght and look foward to encourage normal gait.   Stairs Stairs: Yes Stairs assistance: Min assist Stair Management: One rail Left Number of Stairs: 1 General stair comments: pt able to ascend and descend 1 stair fowards with Min Vc to hold on to  railing and hip and knee flexion to safely clear step.  Wheelchair Mobility    Modified Rankin (Stroke Patients Only) Modified Rankin (Stroke Patients Only) Pre-Morbid Rankin Score: No symptoms Modified Rankin: Moderately severe disability     Balance Overall balance assessment: Modified Independent Sitting-balance support: No upper extremity supported;Feet supported Sitting balance-Leahy Scale: Good     Standing balance support: Bilateral upper extremity supported;During functional activity Standing balance-Leahy Scale: Poor                      Cognition Arousal/Alertness: Awake/alert Behavior During Therapy: Flat affect Overall Cognitive Status: Impaired/Different from baseline Area of Impairment: Problem solving;Memory;Orientation;Attention;Following commands;Awareness Orientation Level:  (able to report hospital when given choice) Current Attention Level: Focused (progressing to sustained) Memory: Decreased short-term memory Following Commands: Follows one step commands consistently   Awareness: Intellectual (Pt with noted R side inattention) Problem Solving: Slow processing;Requires verbal cues;Difficulty sequencing;Requires tactile cues General Comments: Pt with word finding diffulties and perseveration requiring VC and Tactile cues for using call light to call nurse. Pt unable to problem solve and identfy red button without Max VC.    Exercises      General Comments General comments (skin integrity, edema, etc.): Because of difficultly finding words to complete thoughts, it can be a challenge to understand pt. General questions were answered by pt family member who was in the room.       Pertinent Vitals/Pain Pain Assessment: No/denies pain    Home Living  Prior Function            PT Goals (current goals can now be found in the care plan section) Acute Rehab PT Goals Patient Stated Goal: Pt did not report Progress  towards PT goals: Progressing toward goals    Frequency  Min 4X/week    PT Plan Current plan remains appropriate    Co-evaluation             End of Session Equipment Utilized During Treatment: Gait belt Activity Tolerance: Patient tolerated treatment well Patient left: in chair;with call bell/phone within reach;with family/visitor present     Time: [id number] PT Time Calculation (min) (ACUTE ONLY): 30 min  Charges:  $Gait Training: 8-22 mins $Therapeutic Activity: 8-22 mins                    G Codes:      Carren RangRobby Emiline Mancebo 06/15/2015, 10:19 AM   Carren Rangobby Jaana Brodt, SPTA (student physical therapy assistant) Acute Rehab [phone]

## 2015-06-15 NOTE — Progress Notes (Signed)
Rehab admissions - Evaluated for possible admission.  [facility] and asked for acute inpatient rehab admission.  OT just completed.  I have called and [facility].  Awaiting response.  I will follow up tomorrow.  Call me for questions.  #[phone]

## 2015-06-15 NOTE — Evaluation (Signed)
Speech Language Pathology Evaluation Patient Details Name: Holly Sparks MRN: [id number] DOB: 11/02/1941 Today's Date: 06/15/2015 Time: 7829-5621 SLP Time Calculation (min) (ACUTE ONLY): 27 min  Problem List:  Patient Active Problem List   Diagnosis Date Noted  . Paroxysmal a-fib: Remote hx of afib 06/15/2015  . Stroke-like symptoms 06/13/2015  . HLD (hyperlipidemia) 06/13/2015  . DDD (degenerative disc disease), cervical 06/13/2015  . Elevated serum creatinine 06/13/2015  . Aphasia 06/13/2015  . Expressive aphasia   . Cerebral infarction due to unspecified mechanism   . Type 2 diabetes mellitus without complication   . IBS (irritable bowel syndrome) 04/24/2013  . High cholesterol 04/24/2013  . Diabetes 04/24/2013  . Essential hypertension, benign 04/24/2013   Past Medical History:  Past Medical History  Diagnosis Date  . Hypertension     x 10 yrs  . 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 High cholesterol   . Presence of permanent cardiac pacemaker   . Paroxysmal a-fib: Remote hx of afib 06/15/2015   Past Surgical History:  Past Surgical History  Procedure Laterality Date  . Neck surgery      x 2 for spur and disc problems  . Colonoscopy    . Colonoscopy N/A 05/08/2013    Procedure: COLONOSCOPY;  Surgeon: Malissa Hippo, MD;  Location: [facility];  Service: Endoscopy;  Laterality: N/A;  1015   HPI:  Holly Sparks is a 74 y.o. female, with hypertension, hyperlipidemia, diabetes and recently an irregular heartbeat, presented to ER with difficulty speaking. Diagnosed with left MCA CVA.    Assessment / Plan / Recommendation Clinical Impression  Cognitive-linguistic evaluation complete. Patient presents with a global aphasia impacting both expressive and receptive language skills although greatly improved compared to admission. Also note suspected cognitive impairements in the areas of attention and memory however difficult to fully diagnose given communication  impairements at this time. Patient will benefit from skilled SLP f/u to facilitate improved communication skills. Recommend CIR consult. Extensive education complete with patient and daughter regarding aphasia diagnosis, strategies to facilitate improved communication, prognosis, and plan.     SLP Assessment  Patient needs continued Speech Lanaguage Pathology Services    Follow Up Recommendations  Inpatient Rehab    Frequency and Duration min 2x/week  2 weeks   Pertinent Vitals/Pain Pain Assessment: No/denies pain   SLP Goals  Potential to Achieve Goals (ACUTE ONLY): Good  SLP Evaluation Prior Functioning  Cognitive/Linguistic Baseline: Within functional limits Type of Home: House  Lives With: Spouse Available Help at Discharge: Family;Available 24 hours/day   Cognition  Overall Cognitive Status:  (difficult to assess due to aphasia,seemingly with cog defici) Arousal/Alertness: Awake/alert Orientation Level: Oriented to person;Disoriented to place;Disoriented to time;Disoriented to situation Attention: Sustained Sustained Attention: Impaired Sustained Attention Impairment: Verbal complex;Functional complex Memory:  (difficult to fully assess due to aphasia) Awareness: Impaired Awareness Impairment: Intellectual impairment Problem Solving:  (difficult to assess due to aphasia)    Comprehension  Auditory Comprehension Overall Auditory Comprehension: Impaired Yes/No Questions: Impaired Basic Biographical Questions: 76-100% accurate Basic Immediate Environment Questions: 75-100% accurate Complex Questions: 25-49% accurate Commands: Impaired One Step Basic Commands: 50-74% accurate Interfering Components: Attention EffectiveTechniques: Visual/Gestural cues Visual Recognition/Discrimination Discrimination: Within Function Limits Reading Comprehension Reading Status: Not tested    Expression Expression Primary Mode of Expression: Verbal Verbal Expression Overall Verbal  Expression: Impaired Initiation: Impaired Level of Generative/Spontaneous Verbalization: Word Repetition: Impaired Level of Impairment: Phrase level Naming: Impairment Responsive: 76-100% accurate Confrontation: Impaired  Convergent: 50-74% accurate Divergent: 0-24% accurate Verbal Errors: Phonemic paraphasias;Neologisms;Not aware of errors;Perseveration Pragmatics: No impairment Written Expression Dominant Hand: Right Written Expression: Not tested   Oral / Motor Oral Motor/Sensory Function Overall Oral Motor/Sensory Function: Impaired Labial ROM: Reduced right Labial Symmetry: Abnormal symmetry right Labial Strength: Within Functional Limits Labial Sensation: Within Functional Limits Lingual ROM: Within Functional Limits Lingual Symmetry: Within Functional Limits Lingual Strength: Within Functional Limits Facial ROM: Reduced right Facial Symmetry: Right droop Facial Strength: Reduced Facial Sensation: Within Functional Limits Velum: Within Functional Limits Mandible: Within Functional Limits Motor Speech Overall Motor Speech: Appears within functional limits for tasks assessed   GO    Ferdinand LangoLeah Shaasia Odle MA, CCC-SLP [phone]  Ferdinand LangoMcCoy [name] Meryl 06/15/2015, 1:38 PM

## 2015-06-15 NOTE — Care Management Important Message (Signed)
Important Message  Patient Details  Name: Holly BoschCatherine P Brock MRN: [id number] Date of Birth: 11/19/1941   Medicare Important Message Given:  Yes-second notification given    Orson AloeMegan P Lakoda Mcanany 06/15/2015, 1:41 PM

## 2015-06-15 NOTE — Progress Notes (Signed)
OT Cancellation Note  Patient Details Name: Holly Sparks MRN: [id number] DOB: March 26, 1941   Cancelled Treatment:    Reason Eval/Treat Not Completed: Patient at procedure or test/ unavailable. Pt just let room to go down for swallowing test. Will try to see later today or first thing in AM as schedule allows.  [name], [name] [phone] 06/15/2015, 9:28 AM

## 2015-06-16 ENCOUNTER — Encounter (HOSPITAL_COMMUNITY): Payer: Self-pay | Admitting: *Deleted

## 2015-06-16 ENCOUNTER — Inpatient Hospital Stay (HOSPITAL_COMMUNITY)
Admission: RE | Admit: 2015-06-16 | Discharge: 2015-06-27 | DRG: 057 | Disposition: A | Discharge status: Home / Self Care | Payer: Medicare Other | Source: Intra-hospital | Attending: Physical Medicine & Rehabilitation | Admitting: Physical Medicine & Rehabilitation

## 2015-06-16 DIAGNOSIS — I69391 Dysphagia following cerebral infarction: Secondary | ICD-10-CM | POA: Diagnosis not present

## 2015-06-16 DIAGNOSIS — R52 Pain, unspecified: Secondary | ICD-10-CM

## 2015-06-16 DIAGNOSIS — E780 Pure hypercholesterolemia: Secondary | ICD-10-CM | POA: Diagnosis present

## 2015-06-16 DIAGNOSIS — R110 Nausea: Secondary | ICD-10-CM | POA: Diagnosis not present

## 2015-06-16 DIAGNOSIS — I10 Essential (primary) hypertension: Secondary | ICD-10-CM | POA: Diagnosis present

## 2015-06-16 DIAGNOSIS — I69320 Aphasia following cerebral infarction: Secondary | ICD-10-CM | POA: Diagnosis present

## 2015-06-16 DIAGNOSIS — R4701 Aphasia: Secondary | ICD-10-CM | POA: Diagnosis present

## 2015-06-16 DIAGNOSIS — I63412 Cerebral infarction due to embolism of left middle cerebral artery: Principal | ICD-10-CM

## 2015-06-16 DIAGNOSIS — E1142 Type 2 diabetes mellitus with diabetic polyneuropathy: Secondary | ICD-10-CM | POA: Diagnosis present

## 2015-06-16 DIAGNOSIS — R12 Heartburn: Secondary | ICD-10-CM | POA: Diagnosis present

## 2015-06-16 DIAGNOSIS — I480 Paroxysmal atrial fibrillation: Secondary | ICD-10-CM | POA: Diagnosis present

## 2015-06-16 DIAGNOSIS — I63512 Cerebral infarction due to unspecified occlusion or stenosis of left middle cerebral artery: Secondary | ICD-10-CM | POA: Insufficient documentation

## 2015-06-16 DIAGNOSIS — R0602 Shortness of breath: Secondary | ICD-10-CM

## 2015-06-16 DIAGNOSIS — I69390 Apraxia following cerebral infarction: Secondary | ICD-10-CM

## 2015-06-16 DIAGNOSIS — K5900 Constipation, unspecified: Secondary | ICD-10-CM | POA: Diagnosis present

## 2015-06-16 DIAGNOSIS — F419 Anxiety disorder, unspecified: Secondary | ICD-10-CM | POA: Diagnosis present

## 2015-06-16 DIAGNOSIS — E785 Hyperlipidemia, unspecified: Secondary | ICD-10-CM

## 2015-06-16 DIAGNOSIS — R1312 Dysphagia, oropharyngeal phase: Secondary | ICD-10-CM | POA: Diagnosis present

## 2015-06-16 LAB — GLUCOSE, CAPILLARY
GLUCOSE-CAPILLARY: 153 mg/dL — AB (ref 65–99)
Glucose-Capillary: 105 mg/dL — ABNORMAL HIGH (ref 65–99)
Glucose-Capillary: 108 mg/dL — ABNORMAL HIGH (ref 65–99)
Glucose-Capillary: 152 mg/dL — ABNORMAL HIGH (ref 65–99)
Glucose-Capillary: 207 mg/dL — ABNORMAL HIGH (ref 65–99)
Glucose-Capillary: 90 mg/dL (ref 65–99)

## 2015-06-16 LAB — BASIC METABOLIC PANEL
Anion gap: 6 (ref 5–15)
BUN: 8 mg/dL (ref 6–20)
CALCIUM: 9.1 mg/dL (ref 8.9–10.3)
CO2: 29 mmol/L (ref 22–32)
CREATININE: 1.01 mg/dL — AB (ref 0.44–1.00)
Chloride: 104 mmol/L (ref 101–111)
GFR calc non Af Amer: 54 mL/min — ABNORMAL LOW (ref >60)
GLUCOSE: 96 mg/dL (ref 65–99)
Potassium: 4.1 mmol/L (ref 3.5–5.1)
Sodium: 139 mmol/L (ref 135–145)

## 2015-06-16 LAB — CBC
HCT: 39.9 % (ref 36.0–46.0)
Hemoglobin: 13.7 g/dL (ref 12.0–15.0)
MCH: 29.1 pg (ref 26.0–34.0)
MCHC: 34.3 g/dL (ref 30.0–36.0)
MCV: 84.9 fL (ref 78.0–100.0)
PLATELETS: 139 10*3/uL — AB (ref 150–400)
RBC: 4.7 MIL/uL (ref 3.87–5.11)
RDW: 13.1 % (ref 11.5–15.5)
WBC: 4.9 10*3/uL (ref 4.0–10.5)

## 2015-06-16 MED ORDER — ACETAMINOPHEN 650 MG RE SUPP
650.0000 mg | RECTAL | Status: DC | PRN
  Filled 2015-06-16: qty 1

## 2015-06-16 MED ORDER — LORAZEPAM 0.5 MG PO TABS
0.5000 mg | ORAL_TABLET | Freq: Three times a day (TID) | ORAL | Status: DC | PRN
  Administered 2015-06-26 (×2): 0.5 mg via ORAL
  Filled 2015-06-16 (×3): qty 1

## 2015-06-16 MED ORDER — ACETAMINOPHEN 325 MG PO TABS
650.0000 mg | ORAL_TABLET | ORAL | Status: DC | PRN
  Administered 2015-06-17: 650 mg via ORAL
  Filled 2015-06-16: qty 2

## 2015-06-16 MED ORDER — LISINOPRIL 20 MG PO TABS
20.0000 mg | ORAL_TABLET | Freq: Every day | ORAL | Status: DC
  Administered 2015-06-16: 20 mg via ORAL
  Filled 2015-06-16: qty 1

## 2015-06-16 MED ORDER — HYOSCYAMINE SULFATE 0.125 MG SL SUBL
0.1250 mg | SUBLINGUAL_TABLET | SUBLINGUAL | Status: DC | PRN
  Filled 2015-06-16: qty 1

## 2015-06-16 MED ORDER — ATORVASTATIN CALCIUM 20 MG PO TABS
20.0000 mg | ORAL_TABLET | Freq: Every day | ORAL | Status: DC
  Administered 2015-06-16 – 2015-06-26 (×11): 20 mg via ORAL
  Filled 2015-06-16 (×12): qty 1

## 2015-06-16 MED ORDER — LISINOPRIL 20 MG PO TABS
20.0000 mg | ORAL_TABLET | Freq: Every day | ORAL | Status: DC
  Administered 2015-06-17 – 2015-06-21 (×5): 20 mg via ORAL
  Filled 2015-06-16 (×6): qty 1

## 2015-06-16 MED ORDER — LORAZEPAM 0.5 MG PO TABS: 0.5000 mg | ORAL_TABLET | Freq: Four times a day (QID) | ORAL | Status: DC | PRN

## 2015-06-16 MED ORDER — METOPROLOL TARTRATE 12.5 MG HALF TABLET
12.5000 mg | ORAL_TABLET | Freq: Every day | ORAL | Status: DC
  Administered 2015-06-17 – 2015-06-20 (×4): 12.5 mg via ORAL
  Filled 2015-06-16 (×5): qty 1

## 2015-06-16 MED ORDER — RESOURCE THICKENUP CLEAR PO POWD
ORAL | Status: DC | PRN
  Filled 2015-06-16: qty 125

## 2015-06-16 MED ORDER — HYDROCHLOROTHIAZIDE 25 MG PO TABS
25.0000 mg | ORAL_TABLET | Freq: Every day | ORAL | Status: DC
  Administered 2015-06-16: 25 mg via ORAL
  Filled 2015-06-16: qty 1

## 2015-06-16 MED ORDER — INSULIN ASPART 100 UNIT/ML ~~LOC~~ SOLN
0.0000 [IU] | SUBCUTANEOUS | Status: DC
  Administered 2015-06-16: 3 [IU] via SUBCUTANEOUS
  Administered 2015-06-17: 1 [IU] via SUBCUTANEOUS
  Administered 2015-06-17: 2 [IU] via SUBCUTANEOUS

## 2015-06-16 MED ORDER — APIXABAN 5 MG PO TABS
5.0000 mg | ORAL_TABLET | Freq: Two times a day (BID) | ORAL | Status: DC
  Administered 2015-06-16 – 2015-06-27 (×22): 5 mg via ORAL
  Filled 2015-06-16 (×24): qty 1

## 2015-06-16 MED ORDER — PANTOPRAZOLE SODIUM 40 MG PO TBEC
40.0000 mg | DELAYED_RELEASE_TABLET | Freq: Every day | ORAL | Status: DC
  Administered 2015-06-17 – 2015-06-25 (×9): 40 mg via ORAL
  Filled 2015-06-16 (×8): qty 1

## 2015-06-16 MED ORDER — INSULIN GLARGINE 100 UNIT/ML ~~LOC~~ SOLN
10.0000 [IU] | Freq: Every day | SUBCUTANEOUS | Status: DC
  Administered 2015-06-16 – 2015-06-17 (×2): 10 [IU] via SUBCUTANEOUS
  Filled 2015-06-16 (×4): qty 0.1

## 2015-06-16 MED ORDER — HYDROCHLOROTHIAZIDE 25 MG PO TABS
25.0000 mg | ORAL_TABLET | Freq: Every day | ORAL | Status: DC
  Administered 2015-06-17 – 2015-06-21 (×5): 25 mg via ORAL
  Filled 2015-06-16 (×6): qty 1

## 2015-06-16 MED ORDER — ONDANSETRON HCL 4 MG PO TABS
4.0000 mg | ORAL_TABLET | Freq: Four times a day (QID) | ORAL | Status: DC | PRN
  Administered 2015-06-22 – 2015-06-25 (×2): 4 mg via ORAL
  Filled 2015-06-16 (×2): qty 1

## 2015-06-16 MED ORDER — TRAMADOL HCL 50 MG PO TABS
50.0000 mg | ORAL_TABLET | Freq: Four times a day (QID) | ORAL | Status: DC | PRN
  Administered 2015-06-17 – 2015-06-26 (×5): 50 mg via ORAL
  Filled 2015-06-16 (×5): qty 1

## 2015-06-16 MED ORDER — LORAZEPAM 0.5 MG PO TABS: 0.5000 mg | ORAL_TABLET | Freq: Three times a day (TID) | ORAL | Status: DC | PRN

## 2015-06-16 MED ORDER — SORBITOL 70 % SOLN
30.0000 mL | Freq: Every day | Status: DC | PRN
  Administered 2015-06-17 – 2015-06-23 (×3): 30 mL via ORAL
  Filled 2015-06-16 (×3): qty 30

## 2015-06-16 MED ORDER — ONDANSETRON HCL 4 MG/2ML IJ SOLN: 4.0000 mg | Freq: Four times a day (QID) | INTRAMUSCULAR | Status: DC | PRN

## 2015-06-16 MED ORDER — LISINOPRIL-HYDROCHLOROTHIAZIDE 20-25 MG PO TABS: 1.0000 | ORAL_TABLET | Freq: Every day | ORAL | Status: DC

## 2015-06-16 NOTE — H&P (Signed)
Physical Medicine and Rehabilitation Admission H&P   Chief Complaint  Patient presents with  . Cerebrovascular Accident  : HPI: Holly Sparks is a 74 y.o. right handed female with history of hypertension, diabetes mellitus with peripheral neuropathy as well as history of arrhythmia/atrial fibrillation seen by [facility] cardiology in the past maintained on metoprolol as well as aspirin. Independent prior to admission living with her husband. Presented 06/13/2015 with speech difficulties and inability to follow commands. Cranial CT scan negative. EKG showed T-wave inversions. Troponin negative. Patient did not receive TPA. MRI of the brain showed acute left MCA territory infarcts most confluent involvement in the left basal ganglia. MRA of the head negative. Echocardiogram with ejection fraction of 95% grade 1 diastolic dysfunction. Carotid Dopplers with no ICA stenosis. Neurology services consulted presently maintained on Eliquis CVA prophylaxis. Dysphagia 1 nectar liquids. Physical and occupational therapy evaluations completed 06/14/2015 with recommendations of physical medicine rehabilitation consult. Patient was admitted for comprehensive rehabilitation program  ROS Review of Systems  Constitutional: Negative for fever and chills.  HENT: Negative for hearing loss.  Eyes: Negative for blurred vision.  Respiratory: Negative for cough and shortness of breath.  Cardiovascular: Positive for palpitations.  Gastrointestinal: Positive for heartburn and constipation.  Musculoskeletal: Positive for myalgias and joint pain.  Skin: Negative for rash.  Neurological: Positive for speech change and headaches. Negative for loss of consciousness.  Psychiatric/Behavioral:   Anxiety     Past Medical History  Diagnosis Date  . Hypertension     x 10 yrs  . 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 High cholesterol   . Presence of permanent cardiac  pacemaker   . Paroxysmal a-fib: Remote hx of afib 06/15/2015   Past Surgical History  Procedure Laterality Date  . Neck surgery      x 2 for spur and disc problems  . Colonoscopy    . Colonoscopy N/A 05/08/2013    Procedure: COLONOSCOPY; Surgeon: Rogene Houston, MD; Location: [facility]; Service: Endoscopy; Laterality: N/A; 1015   History reviewed. No pertinent family history. Social History:  reports that she has never smoked. She does not have any smokeless tobacco history on file. She reports that she does not drink alcohol or use illicit drugs. Allergies:  Allergies  Allergen Reactions  . Prednisone Nausea And Vomiting   Medications Prior to Admission  Medication Sig Dispense Refill  . aspirin EC 81 MG tablet Take 81 mg by mouth daily.    Marland Kitchen atorvastatin (LIPITOR) 10 MG tablet Take 10 mg by mouth at bedtime.     . Cinnamon 500 MG capsule Take 2,000 mg by mouth daily.    . hyoscyamine (LEVSIN SL) 0.125 MG SL tablet Place 1 tablet (0.125 mg total) under the tongue every 4 (four) hours as needed for cramping. (Patient taking differently: Place 0.125 mg under the tongue every 4 (four) hours as needed (pain from IBS). ) 60 tablet 1  . lisinopril-hydrochlorothiazide (PRINZIDE,ZESTORETIC) 20-25 MG per tablet Take 1 tablet by mouth daily. For high blood pressure    . LORazepam (ATIVAN) 0.5 MG tablet Take 0.5 mg by mouth 4 (four) times daily as needed for anxiety.     . Menthol, Topical Analgesic, (BIOFREEZE) 4 % GEL Apply 1 application topically daily as needed (leg pain).    . naproxen sodium (ANAPROX) 220 MG tablet Take 440 mg by mouth daily as needed (for pain).    . pantoprazole (PROTONIX) 40 MG tablet  Take 40 mg by mouth daily.    . metFORMIN (GLUCOPHAGE) 500 MG tablet Take 500 mg by mouth 2 (two) times daily with a meal.    . metoprolol (LOPRESSOR) 50 MG tablet Take 12.5 mg by mouth daily.        Home: Home Living Family/patient expects to be discharged to:: Private residence Living Arrangements: Spouse/significant other Available Help at Discharge: Family, Available 24 hours/day (Husband is 61. Able to provide supervision) Type of Home: House Home Access: Stairs to enter Entrance Stairs-Number of Steps: 1 Entrance Stairs-Rails: None Home Layout: Two level, Able to live on main level with bedroom/bathroom Bathroom Shower/Tub: Chiropodist: Standard Home Equipment: None Additional Comments: Information provided primarily by daughter Lives With: Spouse  Functional History: Prior Function Level of Independence: Independent Comments: Driving  Functional Status:  Mobility: Bed Mobility Overal bed mobility: Needs Assistance Bed Mobility: Rolling, Sidelying to Sit, Sit to Supine Rolling: Min assist Sidelying to sit: Mod assist Sit to supine: Min assist General bed mobility comments: Pt recieved in chair Transfers Overall transfer level: Needs assistance Equipment used: 1 person hand held assist Transfers: Sit to/from Stand, Stand Pivot Transfers Sit to Stand: Min assist Stand pivot transfers: Min assist General transfer comment: Pt requires min A for balance  Ambulation/Gait Ambulation/Gait assistance: Min assist Ambulation Distance (Feet): 250 Feet Assistive device: Rolling walker (2 wheeled) Gait Pattern/deviations: Step-through pattern, Decreased stride length, Narrow base of support General Gait Details: pt was able to ambulate 250 ft however required min vc to increase step lenght and look foward to encourage normal gait. Gait velocity: slow Gait velocity interpretation: Below normal speed for age/gender Stairs: Yes Stairs assistance: Min assist Stair Management: One rail Left Number of Stairs: 1 General stair comments: pt able to ascend and descend 1 stair fowards with Min Vc to hold on to railing and hip and knee flexion to  safely clear step.    ADL: ADL Overall ADL's : Needs assistance/impaired Eating/Feeding: Set up, Sitting Grooming: Wash/dry hands, Wash/dry face, Oral care, Minimal assistance, Standing Upper Body Bathing: Minimal assitance, Sitting Lower Body Bathing: Minimal assistance, Sit to/from stand Upper Body Dressing : Minimal assistance, Sitting Lower Body Dressing: Minimal assistance, Sit to/from stand Toilet Transfer: Minimal assistance, Stand-pivot, BSC Toileting- Clothing Manipulation and Hygiene: Minimal assistance, Sit to/from stand Functional mobility during ADLs: Minimal assistance General ADL Comments: Assist for balance   Cognition: Cognition Overall Cognitive Status: Difficult to assess Arousal/Alertness: Awake/alert Orientation Level: Oriented to person, Disoriented to place, Disoriented to time, Oriented to situation 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) Awareness: Impaired Awareness Impairment: Intellectual impairment Problem Solving: (difficult to assess due to aphasia) Cognition Arousal/Alertness: Awake/alert Behavior During Therapy: WFL for tasks assessed/performed Overall Cognitive Status: Difficult to assess Area of Impairment: Problem solving, Memory, Orientation, Attention, Following commands, Awareness Orientation Level: (able to report hospital when given choice) Current Attention Level: Focused (progressing to sustained) Memory: Decreased short-term memory Following Commands: Follows multi-step commands consistently Awareness: Intellectual (Pt with noted R side inattention) Problem Solving: Decreased initiation, Slow processing General Comments: Pt with word finding diffulties and perseveration requiring VC and Tactile cues for using call light to call nurse. Pt unable to problem solve and identfy red button without Max  VC. Difficult to assess due to: Impaired communication  Physical Exam: Blood pressure 142/76, pulse 50, temperature 97.9 F (36.6 C), temperature source Oral, resp. rate 16, height '5\' 6"'  (  1.676 m), weight 71.215 kg (157 lb), SpO2 99 %. Physical Exam Constitutional: She appears well-developed. no distress HENT: oral mucosa pink and moist. Dentition good Head: Normocephalic.  Eyes: EOM are normal.  Neck: Normal range of motion. Neck supple. No thyromegaly present.  Cardiovascular:  Cardiac rate controlled. Regular rhythm Respiratory: Effort normal and breath sounds normal. No respiratory distress. no rales and wheezes GI: Soft. Bowel sounds are normal. She exhibits no distension.  Neurological: She is alert.   Makes good eye contact with examiner. Patient with word finding deficits but improved today.  Does perseverate on certain words/ideas. Apraxic at times with motor activities and language.  RUE: 4- delt, bicep, tricep, wrist and hi. LUE: 5/5 prox to distal. Right pronator drift. RLE: 3+/5 hf, 3+ke and 4/5 ankle. LLE 4/5 prox to 5/5 distal. Senses pain and LT in all 4's. Mild right central 7 and tongue deviation.  DTR s 1+.  Skin: Skin is warm and dry.  Psychiatric: She has a normal mood and affect. Her behavior is normal    Lab Results Last 48 Hours    Results for orders placed or performed during the hospital encounter of 06/13/15 (from the past 48 hour(s))  Glucose, capillary Status: Abnormal   Collection Time: 06/14/15 6:33 AM  Result Value Ref Range   Glucose-Capillary 155 (H) 65 - 99 mg/dL   Comment 1 Notify RN    Comment 2 Document in Chart   Glucose, capillary Status: Abnormal   Collection Time: 06/14/15 11:22 AM  Result Value Ref Range   Glucose-Capillary 148 (H) 65 - 99 mg/dL  Glucose, capillary Status: Abnormal   Collection Time: 06/14/15 4:31 PM  Result Value Ref Range   Glucose-Capillary 115 (H) 65 - 99  mg/dL  Glucose, capillary Status: Abnormal   Collection Time: 06/14/15 8:24 PM  Result Value Ref Range   Glucose-Capillary 138 (H) 65 - 99 mg/dL   Comment 1 Notify RN    Comment 2 Document in Chart   Glucose, capillary Status: Abnormal   Collection Time: 06/14/15 11:54 PM  Result Value Ref Range   Glucose-Capillary 136 (H) 65 - 99 mg/dL   Comment 1 Notify RN    Comment 2 Document in Chart   Glucose, capillary Status: Abnormal   Collection Time: 06/15/15 3:06 AM  Result Value Ref Range   Glucose-Capillary 125 (H) 65 - 99 mg/dL   Comment 1 Notify RN    Comment 2 Document in Chart   CBC Status: Abnormal   Collection Time: 06/15/15 5:50 AM  Result Value Ref Range   WBC 5.0 4.0 - 10.5 K/uL   RBC 4.53 3.87 - 5.11 MIL/uL   Hemoglobin 13.3 12.0 - 15.0 g/dL   HCT 38.0 36.0 - 46.0 %   MCV 83.9 78.0 - 100.0 fL   MCH 29.4 26.0 - 34.0 pg   MCHC 35.0 30.0 - 36.0 g/dL   RDW 13.3 11.5 - 15.5 %   Platelets 123 (L) 150 - 400 K/uL    Comment: REPEATED TO VERIFY PLATELET COUNT CONFIRMED BY SMEAR   Basic metabolic panel Status: Abnormal   Collection Time: 06/15/15 5:50 AM  Result Value Ref Range   Sodium 138 135 - 145 mmol/L   Potassium 3.8 3.5 - 5.1 mmol/L   Chloride 106 101 - 111 mmol/L   CO2 25 22 - 32 mmol/L   Glucose, Bld 117 (H) 65 - 99 mg/dL   BUN 8 6 - 20 mg/dL   Creatinine, Ser 0.92 0.44 -  1.00 mg/dL   Calcium 8.7 (L) 8.9 - 10.3 mg/dL   GFR calc non Af Amer >60 >60 mL/min   GFR calc Af Amer >60 >60 mL/min    Comment: (NOTE) The eGFR has been calculated using the CKD EPI equation. This calculation has not been validated in all clinical situations. eGFR's persistently <60 mL/min signify possible Chronic Kidney Disease.    Anion gap 7 5 - 15  Glucose, capillary Status: Abnormal   Collection  Time: 06/15/15 7:34 AM  Result Value Ref Range   Glucose-Capillary 121 (H) 65 - 99 mg/dL   Comment 1 Notify RN    Comment 2 Document in Chart   Glucose, capillary Status: Abnormal   Collection Time: 06/15/15 11:16 AM  Result Value Ref Range   Glucose-Capillary 225 (H) 65 - 99 mg/dL   Comment 1 Notify RN    Comment 2 Document in Chart   Glucose, capillary Status: Abnormal   Collection Time: 06/15/15 4:26 PM  Result Value Ref Range   Glucose-Capillary 110 (H) 65 - 99 mg/dL  Glucose, capillary Status: Abnormal   Collection Time: 06/15/15 8:28 PM  Result Value Ref Range   Glucose-Capillary 123 (H) 65 - 99 mg/dL   Comment 1 Notify RN    Comment 2 Document in Chart   Glucose, capillary Status: Abnormal   Collection Time: 06/16/15 12:22 AM  Result Value Ref Range   Glucose-Capillary 105 (H) 65 - 99 mg/dL   Comment 1 Notify RN    Comment 2 Document in Chart   Glucose, capillary Status: None   Collection Time: 06/16/15 3:21 AM  Result Value Ref Range   Glucose-Capillary 90 65 - 99 mg/dL   Comment 1 Notify RN    Comment 2 Document in Chart       Imaging Results (Last 48 hours)    Dg Swallowing Func-speech Pathology  06/15/2015 Objective Swallowing Evaluation: MBS Patient Details Name: SYAN CULLIMORE MRN: [id number] Date of Birth: 02-07-1941 Today's Date: 06/15/2015 Time: SLP Start Time (ACUTE ONLY): 0953-SLP Stop Time (ACUTE ONLY): 1015 SLP Time Calculation (min) (ACUTE ONLY): 22 min Past Medical History: Past Medical History Diagnosis Date . Hypertension x 10 yrs . Diabetes Type 2 x 10 yrs . High cholesterol . Presence of permanent cardiac pacemaker . Paroxysmal a-fib: Remote hx of afib 06/15/2015 Past Surgical History: Past Surgical History Procedure Laterality Date . Neck surgery x 2 for spur and disc problems . Colonoscopy .  Colonoscopy N/A 05/08/2013 Procedure: COLONOSCOPY; Surgeon: Rogene Houston, MD; Location: [facility]; Service: Endoscopy; Laterality: N/A; 1015 HPI: Other Pertinent Information: Rozalyn Osland is a 74 y.o. female, with hypertension, hyperlipidemia, diabetes and recently an irregular heartbeat, presented to ER with difficulty speaking. Diagnosed with left MCA CVA. No Data Recorded Assessment / Plan / Recommendation CHL IP CLINICAL IMPRESSIONS 06/15/2015 Therapy Diagnosis Mild oral phase dysphagia;Mild pharyngeal phase dysphagia Clinical Impression Patient presents with a mild sensory motor based oropharyngeal dysphagia. Patient with a mildly delayed swallow initiation and decreased laryngeal closure resulting in silent aspiration of thin liquids. Cueing for chin tuck reduced aspiration to penetration, right head turn prevented aspiration during todays study however patient required moderate cues for accurate usage. Mild pharyngeal residuals noted post swallow may be dur to oro-pharyngeal weakness and/or presence of cervical hardware at C3-5. Residuals cleared with spontaneous dry swallows and did not significantly impact overall function. Given communication and cognitive deficits impacting carryover of newly learned information, recommend continuing use of thickened liquids with therapeutic trials  of upgraded liquids at bedside. CHL IP TREATMENT RECOMMENDATION 06/15/2015 Treatment Recommendations Therapy as outlined in treatment plan below CHL IP DIET RECOMMENDATION 06/15/2015 SLP Diet Recommendations Dysphagia 3 (Mech soft);Nectar Liquid Administration via (None) Medication Administration Whole meds with puree Compensations Slow rate;Small sips/bites Postural Changes and/or Swallow Maneuvers (None) CHL IP OTHER RECOMMENDATIONS 06/15/2015 Recommended Consults (None) Oral Care Recommendations Oral care BID Other Recommendations Order thickener from  pharmacy;Prohibited food (jello, ice cream, thin soups);Remove water pitcher No flowsheet data found. CHL IP FREQUENCY AND DURATION 06/15/2015 Speech Therapy Frequency (ACUTE ONLY) min 2x/week Treatment Duration 2 weeks Gabriel Rainwater MA, CCC-SLP [phone] McCoy Leah Meryl 06/15/2015, 11:22 AM        Medical Problem List and Plan: 1. Functional deficits secondary to cardioembolic left MCA infarct 2. DVT Prophylaxis/Anticoagulation: Eliquis. Monitor for any bleeding episodes 3. Pain Management: Ultram as needed for breakthrough pain 4. Mood/anxiety: Ativan 0.5 mg twice daily as needed. Provide emotional support. Pt is motivated and appears to have a supportive family. 5. Neuropsych: This patient is capable of making decisions on her own behalf. 6. Skin/Wound Care: Routine skin checks. Oob. Discussed nutrition 7. Fluids/Electrolytes/Nutrition: Routine I&O with follow-up chemistries ordered for the AM. 8. Dysphagia. Dysphagia #3 nectar thick liquids. Follow-up speech therapy 9. Hypertension/atrial fibrillation. Lopressor 12.5 mg daily for bp and rate controlled -bp borderline to normal--continue current doses of lopressor, lisinopril 20 mg daily, hydrochlorothiazide 25 mg daily and observe as she increases activity toevels 10. Diabetes mellitus of peripheral neuropathy. Hemoglobin A1c 11.8. Lantus insulin 10 units daily at bedtime. Check blood sugars before meals and at bedtime. Good control at present     Post Admission Physician Evaluation: 1. Functional deficits secondary to cardioembolic left MCA infarct. 2. Patient is admitted to receive collaborative, interdisciplinary care between the physiatrist, rehab nursing staff, and therapy team. 3. Patient's level of medical complexity and substantial therapy needs in context of that medical necessity cannot be provided at a lesser intensity of care such as a SNF. 4. Patient has experienced substantial functional loss  from his/her baseline which was documented above under the "Functional History" and "Functional Status" headings. Judging by the patient's diagnosis, physical exam, and functional history, the patient has potential for functional progress which will result in measurable gains while on inpatient rehab. These gains will be of substantial and practical use upon discharge in facilitating mobility and self-care at the household level. 5. Physiatrist will provide 24 hour management of medical needs as well as oversight of the therapy plan/treatment and provide guidance as appropriate regarding the interaction of the two. 6. 24 hour rehab nursing will assist with bladder management, bowel management, safety, skin/wound care, disease management, medication administration, pain management and patient education and help integrate therapy concepts, techniques,education, etc. 7. PT will assess and treat for/with: Lower extremity strength, range of motion, stamina, balance, functional mobility, safety, adaptive techniques and equipment, NMR, cognitive perceptual rx, mgt of apraxia, family ed, stroke education. Goals are: mod I.. 8. OT will assess and treat for/with: ADL's, functional mobility, safety, upper extremity strength, adaptive techniques and equipment, NMR, cognitive perceptual rx, family ed, stroke ed, ego support. Goals are: mod I. Therapy may proceed with showering this patient. 9. SLP will assess and treat for/with: language, cognition, communication. Goals are: mod I to supervision. 10. Case Management and Social Worker will assess and treat for psychological issues and discharge planning. 11. Team conference will be held weekly to assess progress toward goals and to determine barriers to discharge. 12.  Patient will receive at least 3 hours of therapy per day at least 5 days per week. 13. ELOS: 7-10 days  14. Prognosis: excellent     Meredith Staggers, MD, [place] [facility] 06/16/2015

## 2015-06-16 NOTE — Progress Notes (Signed)
Rehab admissions - I met with patient and her husband earlier and explained benefits to them.  They are agreeable to inpatient rehab admission.  I now have approval from [facility] Medicare for acute inpatient rehab admission.  Bed available and will admit to inpatient rehab today.  Call me for questions.  #[phone]

## 2015-06-16 NOTE — Progress Notes (Signed)
Discharged order received to discharge pt to CIR. Pt alert and oriented intermittently.  Report given to nurse [facility]([place]).

## 2015-06-16 NOTE — Progress Notes (Signed)
Trish Mage, RN Rehab Admission Coordinator Signed Physical Medicine and Rehabilitation PMR Pre-admission 06/16/2015 12:16 PM  Related encounter: ED to Hosp-Admission (Current) from 06/13/2015 in [facility]    Expand All Collapse All   PMR Admission Coordinator Pre-Admission Assessment  Patient: Holly Sparks is an 74 y.o., female MRN: [id number] DOB: [birth year] Height:  (167.6 cm) Weight: 71.215 kg (157 lb)  Insurance Information HMO: No PPO: PCP: IPA: 80/20: OTHER: Group # [id number] PRIMARY: [employer] Medicare Policy#: [id number] Subscriber: [name] CM Name: Volanda Napoleon Phone#: [phone] Fax#: [phone] Pre-Cert#: [id number] Employer: Retired Benefits: Phone #: [phone] Name: On line Eff. Date: 11/28/14 Deduct: $0 Out of Pocket Max: $4900 [phone] remaining)  Life Max: unlimited CIR: $345 days 1-4 SNF: $0 days 1-20; $160 days 21-51; $0 days 52-100 Outpatient: No limits Co-Pay: $40/visit Home Health: 100% Co-Pay: none DME: 80% Co-Pay: 20% Providers: in Surveyor, quantity Information    Name Relation Home Work Mobile   Engelbrecht,Bobby Spouse [phone]  [phone]     Current Medical History  Patient Admitting Diagnosis: L MCA infarct  History of Present Illness: A 74 y.o. right handed female with history of hypertension, diabetes mellitus with peripheral neuropathy as well as history of arrhythmia/atrial fibrillation seen by [facility] cardiology in the past maintained on metoprolol as well as aspirin. Independent prior to admission living with her husband. Presented 06/13/2015 with speech difficulties and inability to follow commands. Cranial CT scan  negative. EKG showed T-wave inversions. Troponin negative. Patient did not receive TPA. MRI of the brain showed acute left MCA territory infarcts most confluent involvement in the left basal ganglia. MRA of the head negative. Echocardiogram with ejection fraction of 65% grade 1 diastolic dysfunction. Carotid Dopplers with no ICA stenosis. Neurology services consulted presently maintained on Eliquis CVA prophylaxis. Dysphagia 1 nectar liquids. Physical and occupational therapy evaluations completed 06/14/2015 with recommendations of physical medicine rehabilitation consult. Patient to be admitted for comprehensive inpatient rehabilitation program.   Total: 2=NIH  Past Medical History  Past Medical History  Diagnosis Date  . Hypertension     x 10 yrs  . 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 High cholesterol   . Presence of permanent cardiac pacemaker   . Paroxysmal a-fib: Remote hx of afib 06/15/2015    Family History  family history is not on file.  Prior Rehab/Hospitalizations:  Has the patient had major surgery during 100 days prior to admission? No  Current Medications   Current facility-administered medications:  . acetaminophen (TYLENOL) tablet 650 mg, 650 mg, Oral, Q4H PRN, 650 mg at 06/14/15 0236 **OR** acetaminophen (TYLENOL) suppository 650 mg, 650 mg, Rectal, Q4H PRN, Tora Kindred York, PA-C . apixaban (ELIQUIS) tablet 5 mg, 5 mg, Oral, BID, Bertram Millard, RPH, 5 mg at 06/16/15 1116 . atorvastatin (LIPITOR) tablet 20 mg, 20 mg, Oral, QHS, Rodolph Bong, MD, 20 mg at 06/15/15 2104 . hydrALAZINE (APRESOLINE) injection 5 mg, 5 mg, Intravenous, Q6H PRN, Tora Kindred York, PA-C . lisinopril (PRINIVIL,ZESTRIL) tablet 20 mg, 20 mg, Oral, Daily, 20 mg at 06/16/15 1116 **AND** hydrochlorothiazide (HYDRODIURIL) tablet 25 mg, 25 mg, Oral, Daily, Ramiro Harvest V, MD, 25 mg at 06/16/15 1116 . hyoscyamine (LEVSIN SL) SL tablet 0.125 mg, 0.125 mg, Sublingual,  Q4H PRN, Ramiro Harvest V, MD . insulin aspart (novoLOG) injection 0-9 Units, 0-9 Units, Subcutaneous, 6 times per day, Stephani Police, PA-C, 1 Units at 06/15/15 2048 .  insulin glargine (LANTUS) injection 10 Units, 10 Units, Subcutaneous, QHS, Stephani PoliceMarianne L York, PA-C, 10 Units at 06/15/15 2105 . LORazepam (ATIVAN) tablet 0.5 mg, 0.5 mg, Oral, TID PRN, Ramiro Harvestaniel Thompson V, MD . metoprolol tartrate (LOPRESSOR) tablet 12.5 mg, 12.5 mg, Oral, Daily, Ramiro Harvestaniel Thompson V, MD, 12.5 mg at 06/16/15 1116 . pantoprazole (PROTONIX) EC tablet 40 mg, 40 mg, Oral, Daily, Ramiro Harvestaniel Thompson V, MD, 40 mg at 06/16/15 1116 . RESOURCE THICKENUP CLEAR, , Oral, PRN, Rodolph Bonganiel Thompson V, MD . traMADol Janean Sark(ULTRAM) tablet 50 mg, 50 mg, Oral, Q6H PRN, Rodolph Bonganiel Thompson V, MD  Patients Current Diet: DIET DYS 3 Room service appropriate?: Yes; Fluid consistency:: Nectar Thick  Precautions / Restrictions Precautions Precautions: Fall Restrictions Weight Bearing Restrictions: No   Has the patient had 2 or more falls or a fall with injury in the past year?Yes. Went to her doctor for a back injury from a fall.  Prior Activity Level Limited Community (1-2x/wk): Went out 2-3 X a week. Was driving.  Home Assistive Devices / Equipment Home Assistive Devices/Equipment: CBG Meter Home Equipment: None  Prior Device Use: Indicate devices/aids used by the patient prior to current illness, exacerbation or injury? None of the above  Prior Functional Level Prior Function Level of Independence: Independent Comments: Driving  Self Care: Did the patient need help bathing, dressing, using the toilet or eating? Independent  Indoor Mobility: Did the patient need assistance with walking from room to room (with or without device)? Independent  Stairs: Did the patient need assistance with internal or external stairs (with or without device)? Independent  Functional Cognition: Did the patient need help planning regular tasks such as  shopping or remembering to take medications? Independent  Current Functional Level Cognition  Arousal/Alertness: Awake/alert Overall Cognitive Status: Difficult to assess Difficult to assess due to: Impaired communication Current Attention Level: Focused (progressing to sustained) Orientation Level: Oriented to person, Oriented to time, Disoriented to place, Oriented to situation Following Commands: Follows multi-step commands consistently General Comments: Pt with word finding diffulties and perseveration requiring VC and Tactile cues for using call light to call nurse. Pt unable to problem solve and identfy red button without Max VC. 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) Awareness: Impaired Awareness Impairment: Intellectual impairment Problem Solving: (difficult to assess due to aphasia)   Extremity Assessment (includes Sensation/Coordination)  Upper Extremity Assessment: Generalized weakness  Lower Extremity Assessment: Defer to PT evaluation RLE Deficits / Details: Strength grossly 4/5; decreased stability in stance    ADLs  Overall ADL's : Needs assistance/impaired Eating/Feeding: Set up, Sitting Grooming: Wash/dry hands, Wash/dry face, Oral care, Minimal assistance, Standing Upper Body Bathing: Minimal assitance, Sitting Lower Body Bathing: Minimal assistance, Sit to/from stand Upper Body Dressing : Minimal assistance, Sitting Lower Body Dressing: Minimal assistance, Sit to/from stand Toilet Transfer: Minimal assistance, Stand-pivot, BSC Toileting- Clothing Manipulation and Hygiene: Minimal assistance, Sit to/from stand Functional mobility during ADLs: Minimal assistance General ADL Comments: Assist for balance     Mobility  Overal bed mobility: Needs Assistance Bed Mobility: Rolling, Sidelying to Sit, Sit to Supine Rolling:  Min assist Sidelying to sit: Mod assist Sit to supine: Min assist General bed mobility comments: Pt recieved in chair    Transfers  Overall transfer level: Needs assistance Equipment used: 1 person hand held assist Transfers: Sit to/from Stand, Stand Pivot Transfers Sit to Stand: Min assist Stand pivot transfers: Min assist General transfer comment: Pt requires  min A for balance     Ambulation / Gait / Stairs / Wheelchair Mobility  Ambulation/Gait Ambulation/Gait assistance: Architect (Feet): 250 Feet Assistive device: Rolling walker (2 wheeled) Gait Pattern/deviations: Step-through pattern, Decreased stride length, Narrow base of support General Gait Details: pt was able to ambulate 250 ft however required min vc to increase step lenght and look foward to encourage normal gait. Gait velocity: slow Gait velocity interpretation: Below normal speed for age/gender Stairs: Yes Stairs assistance: Min assist Stair Management: One rail Left Number of Stairs: 1 General stair comments: pt able to ascend and descend 1 stair fowards with Min Vc to hold on to railing and hip and knee flexion to safely clear step.    Posture / Balance Dynamic Sitting Balance Sitting balance - Comments: Min assist to maintain balance Balance Overall balance assessment: Needs assistance Sitting-balance support: Feet supported Sitting balance-Leahy Scale: Good Sitting balance - Comments: Min assist to maintain balance Standing balance support: During functional activity Standing balance-Leahy Scale: Poor Standing balance comment: requires assist     Special needs/care consideration BiPAP/CPAP No CPM No Continuous Drip IV No Dialysis No  Life Vest No Oxygen No Special Bed No Trach Size No Wound Vac (area) No  Skin Has a rash on her left hand  Bowel mgmt: Last BM 06/13/15 Bladder mgmt: Voiding up in bathroom with  assistance Diabetic mgmt Yes, on metformin at home.    Previous Home Environment Living Arrangements: Spouse/significant other Lives With: Spouse Available Help at Discharge: Family, Available 24 hours/day (Husband is 28. Able to provide supervision). One son lives in [place], one in [place] and a daughter in [place]. Type of Home: House Home Layout: Two level, Able to live on main level with bedroom/bathroom Home Access: Stairs to enter Entrance Stairs-Rails: None Entrance Stairs-Number of Steps: 1 Bathroom Shower/Tub: Engineer, manufacturing systems: Standard Home Care Services: No Additional Comments: Information provided primarily by daughter  Discharge Living Setting Plans for Discharge Living Setting: Patient's home, House, Lives with (comment) (Lives with husband.) Type of Home at Discharge: House Discharge Home Layout: One level Discharge Home Access: Stairs to enter Entrance Stairs-Number of Steps: 1 step up to porch. Does the patient have any problems obtaining your medications?: No  Social/Family/Support Systems Patient Roles: Spouse, Parent (Has a husband, 2 sons and 1 daughter.) Contact Information: Kanetra Ho - spouse Anticipated Caregiver: Husband Anticipated Caregiver's Contact Information: Reita Cliche - (h) [phone] (c) [phone] Ability/Limitations of Caregiver: Husband is retired and can assist. Engineer, structural Availability: 24/7 Discharge Plan Discussed with Primary Caregiver: Yes Is Caregiver In Agreement with Plan?: Yes Does Caregiver/Family have Issues with Lodging/Transportation while Pt is in Rehab?: No  Goals/Additional Needs Patient/Family Goal for Rehab: PT/OT/St mod I goals Expected length of stay: 10-14 days Cultural Considerations: [employer] Dietary Needs: Dys 3, nectar thick liquids Equipment Needs: TBD Pt/Family Agrees to Admission and willing to participate: Yes Program Orientation Provided & Reviewed with Pt/Caregiver  Including Roles & Responsibilities: Yes  Decrease burden of Care through IP rehab admission: N/A  Possible need for SNF placement upon discharge: Not planned  Patient Condition: This patient's condition remains as documented in the consult dated 06/15/15, in which the Rehabilitation Physician determined and documented that the patient's condition is appropriate for intensive rehabilitative care in an inpatient rehabilitation facility. Will admit to inpatient rehab today.  Preadmission Screen Completed By: Trish Mage, 06/16/2015 12:28 PM ______________________________________________________________________  Discussed status with Dr. Riley Kill on 06/16/15 at 1226 and received telephone approval  for admission today.  Admission Coordinator: Trish Mage, time1226/Date07/19/16          Cosigned by: Ranelle Oyster, MD at 06/16/2015 1:49 PM  Revision History     Date/Time User Provider Type Action   06/16/2015 1:49 PM Ranelle Oyster, MD Physician Cosign   06/16/2015 12:28 PM Trish Mage, RN Rehab Admission Coordinator Sign

## 2015-06-16 NOTE — Progress Notes (Signed)
Pt was escorted to CIR ([id number]) at 1715 via wheelchair [facility] Husband at bedside to assist with discharge.

## 2015-06-16 NOTE — Progress Notes (Signed)
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 right handed female with history of hypertension, diabetes mellitus with peripheral neuropathy as well as history of arrhythmia/atrial fibrillation seen by [facility] cardiology in the past maintained on Cardizem and metoprolol as well as aspirin. Independent prior to admission living with her husband. Presented 06/13/2015 with speech difficulties and inability to follow commands. Cranial CT scan negative. EKG showed T-wave inversions. Troponin negative. Patient did not receive TPA. MRI of the brain showed acute left MCA territory infarcts most confluent involvement in the left basal ganglia. MRA of the head negative. Echocardiogram with ejection fraction of 65% grade 1 diastolic dysfunction. Carotid Dopplers with no ICA stenosis. Neurology services consulted presently maintained on Eliquis CVA prophylaxis. Dysphagia 1 nectar liquids. Physical therapy evaluation completed 06/14/2015 with recommendations of physical medicine rehabilitation consult.   Review of Systems  Constitutional: Negative for fever and chills.  HENT: Negative for hearing loss.  Eyes: Negative for blurred vision.  Respiratory: Negative for cough and shortness of breath.  Cardiovascular: Positive for palpitations.  Gastrointestinal: Positive for heartburn and constipation.  Musculoskeletal: Positive for myalgias and joint pain.  Skin: Negative for rash.  Neurological: Positive for speech change and headaches. Negative for loss of consciousness.  Psychiatric/Behavioral:   Anxiety     Past Medical History  Diagnosis Date  . Hypertension     x 10 yrs  . 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 High cholesterol   . Presence of permanent cardiac pacemaker    Past Surgical History  Procedure Laterality Date  . Neck surgery      x 2 for spur and disc problems  . Colonoscopy    . Colonoscopy N/A 05/08/2013    Procedure: COLONOSCOPY; Surgeon: Malissa Hippo, MD; Location: [facility]; Service: Endoscopy; Laterality: N/A; 1015   History reviewed. No pertinent family history. Social History:  reports that she has never smoked. She does not have any smokeless tobacco history on file. She reports that she does not drink alcohol or use illicit drugs. Allergies:  Allergies  Allergen Reactions  . Prednisone Nausea And Vomiting   Medications Prior to Admission  Medication Sig Dispense Refill  . aspirin EC 81 MG tablet Take 81 mg by mouth daily.    Marland Kitchen atorvastatin (LIPITOR) 10 MG tablet Take 10 mg by mouth at bedtime.     . Cinnamon 500 MG capsule Take 2,000 mg by mouth daily.    . hyoscyamine (LEVSIN SL) 0.125 MG SL tablet Place 1 tablet (0.125 mg total) under the tongue every 4 (four) hours as needed for cramping. (Patient taking differently: Place 0.125 mg under the tongue every 4 (four) hours as needed (pain from IBS). ) 60 tablet 1  . lisinopril-hydrochlorothiazide (PRINZIDE,ZESTORETIC) 20-25 MG per tablet Take 1 tablet by mouth daily. For high blood pressure    . LORazepam (ATIVAN) 0.5 MG tablet Take 0.5 mg by mouth 4 (four) times daily as needed for anxiety.     . Menthol, Topical Analgesic, (BIOFREEZE) 4 % GEL Apply 1 application topically daily as needed (leg pain).    Marland Kitchen  naproxen sodium (ANAPROX) 220 MG tablet Take 440 mg by mouth daily as needed (for pain).    . pantoprazole (PROTONIX) 40 MG tablet Take 40 mg by mouth daily.    . metFORMIN (GLUCOPHAGE) 500 MG tablet  Take 500 mg by mouth 2 (two) times daily with a meal.    . metoprolol (LOPRESSOR) 50 MG tablet Take 12.5 mg by mouth daily.       Home: Home Living Family/patient expects to be discharged to:: Private residence Living Arrangements: Spouse/significant other Available Help at Discharge: Family, Available 24 hours/day (Husband is 53. Able to provide supervision) Type of Home: House Home Access: Stairs to enter Entrance Stairs-Number of Steps: 1 Entrance Stairs-Rails: None Home Layout: Two level, Able to live on main level with bedroom/bathroom Home Equipment: None Additional Comments: Information provided primarily by daughter  Functional History: Prior Function Level of Independence: Independent Comments: Driving Functional Status:  Mobility: Bed Mobility Overal bed mobility: Needs Assistance Bed Mobility: Rolling, Sidelying to Sit, Sit to Supine Rolling: Min assist Sidelying to sit: Mod assist Sit to supine: Min assist General bed mobility comments: Verbal and tactile cues for technique. Assist to raise trunk to sitting. Used bed pad to assist with scooting to EOB. Once upright, patient required min assist for balance. Assist to bring LE's onto bed to return to supine. Transfers Overall transfer level: Needs assistance Equipment used: 1 person hand held assist Transfers: Sit to/from Stand Sit to Stand: Mod assist General transfer comment: Assist to rise to standing and for balance in stance. Ambulation/Gait Ambulation/Gait assistance: Mod assist Ambulation Distance (Feet): 2 Feet Assistive device: 1 person hand held assist Gait Pattern/deviations: Step-to pattern, Decreased stride length, Shuffle General Gait Details: Patient able to take 2 steps forward. Gait very unsteady with decreased stability of RLE in stance. Required mod assist for balance.    ADL:    Cognition: Cognition Overall Cognitive Status: Difficult to assess Orientation Level: Oriented  to person Cognition Arousal/Alertness: Awake/alert Behavior During Therapy: Flat affect Overall Cognitive Status: Difficult to assess Area of Impairment: Following commands, Problem solving Following Commands: Follows one step commands inconsistently, Follows one step commands with increased time Problem Solving: Slow processing, Decreased initiation, Difficulty sequencing, Requires tactile cues Difficult to assess due to: Impaired communication  Blood pressure 141/64, pulse 52, temperature 98.4 F (36.9 C), temperature source Tympanic, resp. rate 18, height 5\' 6"  (1.676 m), weight 71.215 kg (157 lb), SpO2 96 %. Physical Exam  Constitutional: She appears well-developed.  HENT:  Head: Normocephalic.  Eyes: EOM are normal.  Neck: Normal range of motion. Neck supple. No thyromegaly present.  Cardiovascular:  Cardiac rate control  Respiratory: Effort normal and breath sounds normal. No respiratory distress.  GI: Soft. Bowel sounds are normal. She exhibits no distension.  Neurological: She is alert.   Makes good eye contact with examiner. Patient with word finding deficits, but is able to id simple objects and provide biographical information with cues. Does perseverate on certain words/ideas. RUE: 4- delt, bicep, tricep, wrist and hi. LUE: 5/5 prox to distal. Right pronator drift. RLE: 3/5 hf, 3+ke and 4/5 ankle. LLE 4/5 prox to 5/5 distal. Senses pain and LT in all 4's. Mild right central 7 and tongue deviation. Very alert  Skin: Skin is warm and dry.  Psychiatric: She has a normal mood and affect. Her behavior is normal.     Lab Results Last 24 Hours    Results for orders placed or performed during the hospital encounter of 06/13/15 (  from the past 24 hour(s))  Glucose, capillary Status: Abnormal   Collection Time: 06/14/15 6:33 AM  Result Value Ref Range   Glucose-Capillary 155 (H) 65 - 99 mg/dL   Comment 1 Notify RN    Comment 2 Document in Chart     Glucose, capillary Status: Abnormal   Collection Time: 06/14/15 11:22 AM  Result Value Ref Range   Glucose-Capillary 148 (H) 65 - 99 mg/dL  Glucose, capillary Status: Abnormal   Collection Time: 06/14/15 4:31 PM  Result Value Ref Range   Glucose-Capillary 115 (H) 65 - 99 mg/dL  Glucose, capillary Status: Abnormal   Collection Time: 06/14/15 8:24 PM  Result Value Ref Range   Glucose-Capillary 138 (H) 65 - 99 mg/dL   Comment 1 Notify RN    Comment 2 Document in Chart   Glucose, capillary Status: Abnormal   Collection Time: 06/14/15 11:54 PM  Result Value Ref Range   Glucose-Capillary 136 (H) 65 - 99 mg/dL   Comment 1 Notify RN    Comment 2 Document in Chart   Glucose, capillary Status: Abnormal   Collection Time: 06/15/15 3:06 AM  Result Value Ref Range   Glucose-Capillary 125 (H) 65 - 99 mg/dL   Comment 1 Notify RN    Comment 2 Document in Chart       Imaging Results (Last 48 hours)    Dg Chest 1 View  06/13/2015 CLINICAL DATA: Possible aspiration EXAM: CHEST 1 VIEW COMPARISON: 12/29/2010 FINDINGS: Cardiac shadow is mildly enlarged. The lungs are well aerated bilaterally. No focal infiltrate or sizable effusion is seen. Postsurgical changes are noted in the cervical spine. IMPRESSION: No active disease. Electronically Signed By: Alcide Clever M.D. On: 06/13/2015 18:32   Ct Head Wo Contrast  06/13/2015 CLINICAL DATA: Found unresponsive. EXAM: CT HEAD WITHOUT CONTRAST TECHNIQUE: Contiguous axial images were obtained from the base of the skull through the vertex without intravenous contrast. COMPARISON: 12/11/2009 FINDINGS: Prominence of the sulci and ventricles identified consistent with brain atrophy. There is mild low attenuation within the subcortical and periventricular white matter. There is no acute brain infarct, intracranial hemorrhage or mass identified. The  paranasal stress set there is partial opacification of the sphenoid sinus. The remaining paranasal sinuses are clear. The calvarium appears to be intact. IMPRESSION: 1. No acute intracranial abnormalities. 2. Brain atrophy and mild chronic microvascular disease. Electronically Signed By: Signa Kell M.D. On: 06/13/2015 11:13   Mr Maxine Glenn Head Wo Contrast  06/13/2015 CLINICAL DATA: 74 year old female with acute onset abnormal speech and difficulty following commands. Initial encounter. EXAM: MRI HEAD WITHOUT CONTRAST MRA HEAD WITHOUT CONTRAST TECHNIQUE: Multiplanar, multiecho pulse sequences of the brain and surrounding structures were obtained without intravenous contrast. Angiographic images of the head were obtained using MRA technique without contrast. COMPARISON: Head CT without contrast 1056 hours today. Cervical spine MRI 12/15/2009. FINDINGS: MRI HEAD FINDINGS Scattered areas of restricted diffusion in the left MCA territory. The most confluent involvement is in the left basal ganglia. There is also left superior frontal gyrus pre motor cortex involvement. There is mild involvement in the left parietal lobe. There is also suggestion of left middle temporal gyrus involvement. No contralateral or posterior fossa restricted diffusion. Major intracranial vascular flow voids are within normal limits. Mild T2 and FLAIR hyperintensity in the acutely affected areas. There is petechial hemorrhage in the left basal ganglia. No significant mass effect. There is also a small focus of petechial hemorrhage or chronic micro hemorrhage in the left temporal lobe. No midline shift,  mass effect, evidence of mass lesion, ventriculomegaly, or extra-axial collection. Cervicomedullary junction and pituitary are within normal limits. Elsewhere gray and white matter signal is normal for age. Visible internal auditory structures appear normal. Trace retained secretions in the nasopharynx. Mastoids are clear.  Paranasal sinus mucosal thickening most pronounced in the sphenoid. Orbits soft tissues are within normal limits. Negative scalp soft tissues. Visualized cervical spine remarkable for partially visible ACDF hardware with mild susceptibility artifact. MRA HEAD FINDINGS Antegrade flow in the posterior circulation. Codominant distal vertebral arteries. Normal PICA origins. Normal vertebrobasilar junction. No basilar stenosis. Normal SCA and right PCA origins. Fetal type left PCA origin. Diminutive or absent right posterior communicating artery. Bilateral PCA branches are within normal limits. Antegrade flow in both ICA siphons. No siphon stenosis. Ophthalmic and posterior communicating artery origins are within normal limits. Carotid termini appear normal. MCA and ACA origins are within normal limits. Anterior communicating artery and visualized bilateral ACA branches are within normal limits. Right MCA M1 segment and branches are within normal limits. Left MCA M1 segment and bifurcation are within normal limits. No major left MCA branch occlusion is identified. IMPRESSION: 1. Acute left MCA territory infarct, most confluent involvement in the left basal ganglia. There is basal ganglia petechial hemorrhage, but no significant mass effect. 2. Negative intracranial MRA. No left MCA branch occlusion identified. 3. Aside from the acute findings, normal for age noncontrast MRI appearance of the brain. Electronically Signed By: Odessa FlemingH Hall M.D. On: 06/13/2015 18:21   Mr Laqueta JeanBrain W ZOWo Contrast  06/13/2015 CLINICAL DATA: 74 year old female with acute onset abnormal speech and difficulty following commands. Initial encounter. EXAM: MRI HEAD WITHOUT CONTRAST MRA HEAD WITHOUT CONTRAST TECHNIQUE: Multiplanar, multiecho pulse sequences of the brain and surrounding structures were obtained without intravenous contrast. Angiographic images of the head were obtained using MRA technique without contrast. COMPARISON: Head  CT without contrast 1056 hours today. Cervical spine MRI 12/15/2009. FINDINGS: MRI HEAD FINDINGS Scattered areas of restricted diffusion in the left MCA territory. The most confluent involvement is in the left basal ganglia. There is also left superior frontal gyrus pre motor cortex involvement. There is mild involvement in the left parietal lobe. There is also suggestion of left middle temporal gyrus involvement. No contralateral or posterior fossa restricted diffusion. Major intracranial vascular flow voids are within normal limits. Mild T2 and FLAIR hyperintensity in the acutely affected areas. There is petechial hemorrhage in the left basal ganglia. No significant mass effect. There is also a small focus of petechial hemorrhage or chronic micro hemorrhage in the left temporal lobe. No midline shift, mass effect, evidence of mass lesion, ventriculomegaly, or extra-axial collection. Cervicomedullary junction and pituitary are within normal limits. Elsewhere gray and white matter signal is normal for age. Visible internal auditory structures appear normal. Trace retained secretions in the nasopharynx. Mastoids are clear. Paranasal sinus mucosal thickening most pronounced in the sphenoid. Orbits soft tissues are within normal limits. Negative scalp soft tissues. Visualized cervical spine remarkable for partially visible ACDF hardware with mild susceptibility artifact. MRA HEAD FINDINGS Antegrade flow in the posterior circulation. Codominant distal vertebral arteries. Normal PICA origins. Normal vertebrobasilar junction. No basilar stenosis. Normal SCA and right PCA origins. Fetal type left PCA origin. Diminutive or absent right posterior communicating artery. Bilateral PCA branches are within normal limits. Antegrade flow in both ICA siphons. No siphon stenosis. Ophthalmic and posterior communicating artery origins are within normal limits. Carotid termini appear normal. MCA and ACA origins are within normal  limits. Anterior communicating  artery and visualized bilateral ACA branches are within normal limits. Right MCA M1 segment and branches are within normal limits. Left MCA M1 segment and bifurcation are within normal limits. No major left MCA branch occlusion is identified. IMPRESSION: 1. Acute left MCA territory infarct, most confluent involvement in the left basal ganglia. There is basal ganglia petechial hemorrhage, but no significant mass effect. 2. Negative intracranial MRA. No left MCA branch occlusion identified. 3. Aside from the acute findings, normal for age noncontrast MRI appearance of the brain. Electronically Signed By: Odessa Fleming M.D. On: 06/13/2015 18:21     Assessment/Plan: Diagnosis: cardioembolic left MCA infarct 1. Does the need for close, 24 hr/day medical supervision in concert with the patient's rehab needs make it unreasonable for this patient to be served in a less intensive setting? Yes 2. Co-Morbidities requiring supervision/potential complications: dm, afib, htn 3. Due to bladder management, bowel management, safety, skin/wound care, disease management, medication administration, pain management and patient education, does the patient require 24 hr/day rehab nursing? Yes 4. Does the patient require coordinated care of a physician, rehab nurse, PT (1-2 hrs/day, 5 days/week), OT (1-2 hrs/day, 5 days/week) and SLP (1-2 hrs/day, 5 days/week) to address physical and functional deficits in the context of the above medical diagnosis(es)? Yes Addressing deficits in the following areas: balance, endurance, locomotion, strength, transferring, bowel/bladder control, bathing, dressing, feeding, grooming, toileting, cognition, speech, language, swallowing and psychosocial support 5. Can the patient actively participate in an intensive therapy program of at least 3 hrs of therapy per day at least 5 days per week? Yes 6. The potential for patient to make measurable gains while on  inpatient rehab is excellent 7. Anticipated functional outcomes upon discharge from inpatient rehab are modified independent with PT, modified independent and supervision with OT, supervision with SLP. 8. Estimated rehab length of stay to reach the above functional goals is: 10-14 days 9. Does the patient have adequate social supports and living environment to accommodate these discharge functional goals? Yes 10. Anticipated D/C setting: Home 11. Anticipated post D/C treatments: HH therapy and Outpatient therapy 12. Overall Rehab/Functional Prognosis: excellent  RECOMMENDATIONS: This patient's condition is appropriate for continued rehabilitative care in the following setting: CIR Patient has agreed to participate in recommended program. Yes Note that insurance prior authorization may be required for reimbursement for recommended care.  Comment: Rehab Admissions Coordinator to follow up.  Thanks,  Ranelle Oyster, MD, [facility]     06/15/2015       Revision History     Date/Time User Provider Type Action   06/15/2015 3:38 PM Ranelle Oyster, MD Physician Sign   06/15/2015 6:35 AM Charlton Amor, PA-C Physician Assistant Pend   View Details Report       Routing History     Date/Time From To Method   06/15/2015 3:38 PM Ranelle Oyster, MD Ranelle Oyster, MD In Basket   06/15/2015 3:38 PM Ranelle Oyster, MD Gareth Morgan, MD Fax

## 2015-06-16 NOTE — Progress Notes (Addendum)
Speech Language Pathology Treatment: Dysphagia;Cognitive-Linquistic  Patient Details Name: Holly Sparks MRN: [id number] DOB: 05/09/1941 Today's Date: 06/16/2015 Time: [id number] SLP Time Calculation (min) (ACUTE ONLY): 50 min  Assessment / Plan / Recommendation Clinical Impression  Pt seen for communication and functional meal observation (breakfast).  Reviewed with son, Barbara CowerJason, and pt findings of MBS yesterday and clinical reasoning for thickened liquids currently (silent aspiration of thin).  Pt consumed only approx 10% of her breakfast stating she does not have an appetite.  Slow oral transiting noted but no oral residuals.  Suspect delayed pharyngeal swallow initiation.  No s/s of aspiration noted *silent aspirator on MBS* therefore will monitor vitals for po tolerance.  Using teach back, reinforced reasoning for diet modifier.  Of note, pt dislikes thickened drinks except for cranberry juice but is willing to continue diet for maximal airway protection.  She further admits to awareness of her swallow changes with this acute CVA.  SLP advised pt to need to consume liquids for hydration (pt is saline locked).  Did not test chin tuck today as large amounts of information shared with pt/son.  Pt does state she was not restricted by MD re: neck movement from prior ACDF.     Given pt demonstrating rapid expressive language improvement, hopeful for liquid consistency to be advanced soon.  Pt with higher level expressive more than receptive language deficits resulting in dysfluencies.  She compensates well by modifying her language.  Advised pt and son to have pt repeat words if listener "fills in" for pt to access language center.  SLP had pt read her tray slip with inaccuracy initially - When SLP had pt read with SLP's reading glasses, she was able to read fluently - pt denies using reading glasses at home.  She reports she does not enjoy reading but does read recipes with direct ? Cue.  As expected,  pt's communication is best in the mornings and worsens as she fatigues later in the day. Pt did require verbal cues to recall SLP obtaining coffee/cranberry juice for her - ? Some memory deficits although pt denies.     Of note, pt is not verbose prior to this CVA per pt and son.  SLP to continue to follow for dysphagia and aphasia treatment.      HPI Other Pertinent Information: Holly Sparks is a 74 y.o. female, with hypertension, hyperlipidemia, diabetes and recently an irregular heartbeat, presented to ER with difficulty speaking. Diagnosed with left MCA CVA.   MBS completed yesterday and pt seen today to assess po tolerance and for skilled language tx.    Pertinent Vitals Pain Location: throat minimal, ceased with intake per pt  SLP Plan  Continue with current plan of care    Recommendations Diet recommendations: Dysphagia 3 (mechanical soft);Nectar-thick liquid Liquids provided via: Cup;No straw Medication Administration: Whole meds with puree Supervision: Patient able to self feed;Intermittent supervision to cue for compensatory strategies Compensations: Slow rate;Small sips/bites Postural Changes and/or Swallow Maneuvers: Seated upright 90 degrees;Upright 30-60 min after meal              Oral Care Recommendations: Oral care BID Follow up Recommendations: Inpatient Rehab Plan: Continue with current plan of care    GO    Donavan Burnetamara Lathon Adan, MS [facility] SLP [phone]

## 2015-06-16 NOTE — Progress Notes (Signed)
Physical Therapy Treatment Patient Details Name: Holly BoschCatherine P Fritts MRN: [id number] DOB: 10/04/1941 Today's Date: 06/16/2015    History of Present Illness Patient is a 74 yo female admitted 06/13/15 with inability to speak or follow commands. MRI of brain showed Acute left MCA territory infarct, most confluent involvement in the left basal ganglia. There is basal ganglia petechial hemorrhage PMH:  DM, HTN, Afib    PT Comments    Pt with improved spirits, ability to have conversation and improved ambulation tolerance however con't to require RW for safe ambulation demo decreased problem solving and word finding difficulties. When asked what she should do if she needed to go to the bathroom pt reports "raise my hand" despite max v/c's pt perseverated on raising hand and waiting for the teacher. Once shown the call light pt was able to point to red button to call for RN.  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  Pt con't to benefit from CIR to address mentioned deficits and progress towards independence.   Follow Up Recommendations  CIR;Supervision/Assistance - 24 hour     Equipment Recommendations  Rolling walker with 5" wheels    Recommendations for Other Services Rehab consult     Precautions / Restrictions Precautions Precautions: Fall Restrictions Weight Bearing Restrictions: No    Mobility  Bed Mobility Overal bed mobility: Needs Assistance Bed Mobility: Supine to Sit;Sit to Supine     Supine to sit: Min guard Sit to supine: Min guard   General bed mobility comments: Min VC for proper hand and foot placement for proper bed mobility technique.  Transfers Overall transfer level: Needs assistance Equipment used: Rolling walker (2 wheeled) Transfers: Sit to/from Stand Sit to Stand: Min assist         General transfer comment: Pt requires min A for balance   Ambulation/Gait Ambulation/Gait assistance: Min guard;Min assist Ambulation Distance (Feet): 400  Feet Assistive device: Rolling walker (2 wheeled);1 person hand held assist (1 person hand held assist for 30 ft with no AD) Gait Pattern/deviations: Step-to pattern;Decreased stride length;Shuffle;Narrow base of support;Trendelenburg Gait velocity: slow Gait velocity interpretation: Below normal speed for age/gender General Gait Details: Pt ambulation tolerance has increased from yesterday. pt more steady with RW   Stairs            Wheelchair Mobility    Modified Rankin (Stroke Patients Only) Modified Rankin (Stroke Patients Only) Pre-Morbid Rankin Score: No symptoms Modified Rankin: Moderately severe disability     Balance Overall balance assessment: Needs assistance Sitting-balance support: No upper extremity supported;Feet supported Sitting balance-Leahy Scale: Good Sitting balance - Comments: Min assist to maintain balance   Standing balance support: Bilateral upper extremity supported;During functional activity Standing balance-Leahy Scale: Poor Standing balance comment: pt able to stand with good balance using a RW with therapist giving Min gaurd A                    Cognition Arousal/Alertness: Awake/alert Behavior During Therapy: WFL for tasks assessed/performed Overall Cognitive Status: Impaired/Different from baseline Area of Impairment: Problem solving;Awareness;Memory   Current Attention Level: Sustained Memory: Decreased short-term memory Following Commands: Follows one step commands consistently   Awareness: Emergent Problem Solving: Slow processing;Requires verbal cues General Comments: Pt has improved overall cognition from yesterday, however problem solving attendtin and awareness are still impaired.    Exercises      General Comments General comments (skin integrity, edema, etc.): Pt expressive communication has improved      Pertinent Vitals/Pain  Pain Assessment: No/denies pain    Home Living                      Prior  Function            PT Goals (current goals can now be found in the care plan section) Acute Rehab PT Goals Patient Stated Goal: none reported Progress towards PT goals: Progressing toward goals    Frequency  Min 4X/week    PT Plan Current plan remains appropriate    Co-evaluation             End of Session Equipment Utilized During Treatment: Gait belt Activity Tolerance: Patient tolerated treatment well Patient left: in bed;with call bell/phone within reach;with bed alarm set;with family/visitor present     Time: 1405-1420 PT Time Calculation (min) (ACUTE ONLY): 15 min  Charges:  $Gait Training: 8-22 mins                    G Codes:      Marcene Brawn 06/16/2015, 3:08 PM   .siign

## 2015-06-16 NOTE — Discharge Summary (Signed)
Physician Discharge Summary  Holly BoschCatherine P Sparks ZOX:[id number] DOB: 09-25-1941 DOA: 06/13/2015  PCP: Milana ObeyKNOWLTON,Holly D, MD  Admit date: 06/13/2015 Discharge date: 06/16/2015  Time spent: 65 minutes  Recommendations for Outpatient Follow-up:  1. Patient be discharged to inpatient rehabilitation center. 2. Follow-up with Dr. Pearlean BrownieSethi of neurology 2 months. 3. Follow-up with PCP in 2 weeks postdischarge from inpatient rehabilitation center.  Discharge Diagnoses:  Principal Problem:   Cerebral infarction due to unspecified mechanism Active Problems:   IBS (irritable bowel syndrome)   High cholesterol   Diabetes   Stroke-like symptoms   HLD (hyperlipidemia)   DDD (degenerative disc disease), cervical   Elevated serum creatinine   Aphasia   Expressive aphasia   Type 2 diabetes mellitus without complication   Paroxysmal a-fib: Remote hx of afib   Cerebral infarction due to embolism of left middle cerebral artery   Essential hypertension   Discharge Condition: Stable and improved  Diet recommendation: Carb modified.  Filed Weights   06/13/15 1018  Weight: 71.215 kg (157 lb)    History of present illness:  Patient is a pleasant 74 year old female history of diabetes hypertension hyperlipidemia and possible questionable irregular heartbeat/remote history of atrial fibrillation per family that was evaluated 2-3 years ago. Patient went to a church retreat last seen normal around 10 PM the night prior to admission. Patient at approximately 7:30 on the morning of admission patient's roommate noted that patient was moaning went to check on her and patient was noted to have garbled speech unable to follow commands. EMS was subsequently called and patient brought to the emergency room. When EMS got there CBG was 286. Patient was brought to the emergency room. Patient was noted in the ED to be aphasic with a right facial droop. CT head done was negative.  Hospital Course:  #1 acute left MCA  territory infarct Patient was admitted with a expressive and receptive aphasia as well as right facial weakness. CT head which was done was negative. MRI of the head which was done was consistent with acute left MCA territory infarct. Patient was admitted to telemetry and stroke workup undertaken. Neurology was consulted and followed the patient throughout the hospitalization. Patient improved slowly and clinically during the hospitalization. 2-D echo with EF of 60-65% with no wall motion abnormalities and no cardiac source of emboli noted. Carotid Dopplers with no significant ICA stenosis. Fasting lipid panel with LDL of 70. Hemoglobin A1c 11.8. Patient's home dose Lipitor was increased to 20 mg daily. PT/OT/ST. Patient had prior admission [facility] palpitations on 09/21/2003 and was noted to have acute onset of atrial fibrillation which spontaneously converted to normal sinus rhythm. Patient at that time was placed on diltiazem and metoprolol for rate control as well as aspirin. Monitor on telemetry. Patient was on admission placed on a  dysphagia 1 diet and prior to discharge diet has been upgraded to a dysphagia 3 diet per speech therapy. Patient has been changed to eliquis due to remote history of atrial fibrillation  for secondary stroke prevention. Continue risk factor modification. PT/OT/ST. Risk factor modification. Patient will be discharged to inpatient rehabilitation center.   #2 hypertension Permissive hypertension secondary to problem #1. Resumed home dose Lopressor, on hospital day #2. Patient's lisinopril HCTZ was resumed on hospital day #3. Patient's blood pressure needed to be assessed and medications titrated accordingly.  #3 hyperlipidemia Fasting lipid panel of 70. Patient's Lipitor was increased to 20 mg daily. Outpatient follow-up.   #4 poorly controlled diabetes  mellitus type 2 Hemoglobin A1c = 11.8. Patient was maintained on Lantus and sliding scale insulin  throughout the hospitalization. Patient will need outpatient follow-up and aggressive risk factor modification.   #5 elevated creatinine Likely secondary to prerenal azotemia. Resolved with hydration. Follow.  #6 remote history of paroxysmal atrial fibrillation Patient with a remote history of paroxysmal atrial fibrillation was placed on Lopressor and Cardizem for rate control as well as aspirin. Patient with CHADS2VASC2 = 4-5. Patient now with an acute stroke. Patient has been started on ELIQUIS for anticoagulation per neurology recommendation. Outpatient follow-up.  #7 abnormal EKG Patient asymptomatic. Cardiac enzymes negative 3. 2-D echo with EF of 60-65% with no wall motion abnormalities no cardiac source of emboli noted. No further cardiac workup needed at this time. Outpatient follow-up.  Procedures:  CT head 06/13/2015  MRI/ MRA head 06/13/2015  Carotid Dopplers 06/14/2015  2-D echo 06/14/2015  Consultations:  Neurology: Dr. Amada Jupiter 06/13/2015    Discharge Exam: Filed Vitals:   06/16/15 1330  BP: 148/62  Pulse: 56  Temp: 97.9 F (36.6 C)  Resp: 20    General: NAD Cardiovascular: RRR Respiratory: CTAB  Discharge Instructions   Discharge Instructions    Ambulatory referral to Neurology    Complete by:  As directed   Please schedule post stroke follow up in 2 months.          Current Discharge Medication List    CONTINUE these medications which have NOT CHANGED   Details  aspirin EC 81 MG tablet Take 81 mg by mouth daily.    atorvastatin (LIPITOR) 10 MG tablet Take 10 mg by mouth at bedtime.     Cinnamon 500 MG capsule Take 2,000 mg by mouth daily.    hyoscyamine (LEVSIN SL) 0.125 MG SL tablet Place 1 tablet (0.125 mg total) under the tongue every 4 (four) hours as needed for cramping. Qty: 60 tablet, Refills: 1   Associated Diagnoses: IBS (irritable bowel syndrome)    lisinopril-hydrochlorothiazide (PRINZIDE,ZESTORETIC) 20-25 MG per tablet  Take 1 tablet by mouth daily. For high blood pressure    LORazepam (ATIVAN) 0.5 MG tablet Take 0.5 mg by mouth 4 (four) times daily as needed for anxiety.     Menthol, Topical Analgesic, (BIOFREEZE) 4 % GEL Apply 1 application topically daily as needed (leg pain).    naproxen sodium (ANAPROX) 220 MG tablet Take 440 mg by mouth daily as needed (for pain).    pantoprazole (PROTONIX) 40 MG tablet Take 40 mg by mouth daily.    metFORMIN (GLUCOPHAGE) 500 MG tablet Take 500 mg by mouth 2 (two) times daily with a meal.    metoprolol (LOPRESSOR) 50 MG tablet Take 12.5 mg by mouth daily.       STOP taking these medications     traMADol (ULTRAM) 50 MG tablet        Allergies  Allergen Reactions  . Prednisone Nausea And Vomiting   Follow-up Information    Follow up with SETHI,PRAMOD, MD In 2 months.   Specialties:  Neurology, Radiology   Why:  Stroke Clinic, Office will call you with appointment date & time   Contact information:   [address] [place] [address] [phone]        The results of significant diagnostics from this hospitalization (including imaging, microbiology, ancillary and laboratory) are listed below for reference.    Significant Diagnostic Studies: Dg Chest 1 View  06/13/2015   CLINICAL DATA:  Possible aspiration  EXAM: CHEST  1 VIEW  COMPARISON:  12/29/2010  FINDINGS: Cardiac shadow is mildly enlarged. The lungs are well aerated bilaterally. No focal infiltrate or sizable effusion is seen. Postsurgical changes are noted in the cervical spine.  IMPRESSION: No active disease.   Electronically Signed   By: Alcide Clever M.D.   On: 06/13/2015 18:32   Ct Head Wo Contrast  06/13/2015   CLINICAL DATA:  Found unresponsive.  EXAM: CT HEAD WITHOUT CONTRAST  TECHNIQUE: Contiguous axial images were obtained from the base of the skull through the vertex without intravenous contrast.  COMPARISON:  12/11/2009  FINDINGS: Prominence of the sulci and ventricles  identified consistent with brain atrophy. There is mild low attenuation within the subcortical and periventricular white matter. There is no acute brain infarct, intracranial hemorrhage or mass identified. The paranasal stress set there is partial opacification of the sphenoid sinus. The remaining paranasal sinuses are clear. The calvarium appears to be intact.  IMPRESSION: 1. No acute intracranial abnormalities. 2. Brain atrophy and mild chronic microvascular disease.   Electronically Signed   By: Signa Kell M.D.   On: 06/13/2015 11:13   Mr Maxine Glenn Head Wo Contrast  06/13/2015   CLINICAL DATA:  74 year old female with acute onset abnormal speech and difficulty following commands. Initial encounter.  EXAM: MRI HEAD WITHOUT CONTRAST  MRA HEAD WITHOUT CONTRAST  TECHNIQUE: Multiplanar, multiecho pulse sequences of the brain and surrounding structures were obtained without intravenous contrast. Angiographic images of the head were obtained using MRA technique without contrast.  COMPARISON:  Head CT without contrast 1056 hours today. Cervical spine MRI 12/15/2009.  FINDINGS: MRI HEAD FINDINGS  Scattered areas of restricted diffusion in the left MCA territory. The most confluent involvement is in the left basal ganglia. There is also left superior frontal gyrus pre motor cortex involvement. There is mild involvement in the left parietal lobe. There is also suggestion of left middle temporal gyrus involvement.  No contralateral or posterior fossa restricted diffusion. Major intracranial vascular flow voids are within normal limits.  Mild T2 and FLAIR hyperintensity in the acutely affected areas. There is petechial hemorrhage in the left basal ganglia. No significant mass effect. There is also a small focus of petechial hemorrhage or chronic micro hemorrhage in the left temporal lobe.  No midline shift, mass effect, evidence of mass lesion, ventriculomegaly, or extra-axial collection. Cervicomedullary junction and  pituitary are within normal limits. Elsewhere gray and white matter signal is normal for age. Visible internal auditory structures appear normal. Trace retained secretions in the nasopharynx. Mastoids are clear. Paranasal sinus mucosal thickening most pronounced in the sphenoid. Orbits soft tissues are within normal limits. Negative scalp soft tissues. Visualized cervical spine remarkable for partially visible ACDF hardware with mild susceptibility artifact.  MRA HEAD FINDINGS  Antegrade flow in the posterior circulation. Codominant distal vertebral arteries. Normal PICA origins. Normal vertebrobasilar junction. No basilar stenosis. Normal SCA and right PCA origins. Fetal type left PCA origin. Diminutive or absent right posterior communicating artery. Bilateral PCA branches are within normal limits.  Antegrade flow in both ICA siphons. No siphon stenosis. Ophthalmic and posterior communicating artery origins are within normal limits. Carotid termini appear normal. MCA and ACA origins are within normal limits. Anterior communicating artery and visualized bilateral ACA branches are within normal limits. Right MCA M1 segment and branches are within normal limits.  Left MCA M1 segment and bifurcation are within normal limits. No major left MCA branch occlusion is identified.  IMPRESSION: 1. Acute left MCA territory infarct, most  confluent involvement in the left basal ganglia. There is basal ganglia petechial hemorrhage, but no significant mass effect. 2. Negative intracranial MRA. No left MCA branch occlusion identified. 3. Aside from the acute findings, normal for age noncontrast MRI appearance of the brain.   Electronically Signed   By: Odessa Fleming M.D.   On: 06/13/2015 18:21   Mr Laqueta Jean ZO Contrast  06/13/2015   CLINICAL DATA:  74 year old female with acute onset abnormal speech and difficulty following commands. Initial encounter.  EXAM: MRI HEAD WITHOUT CONTRAST  MRA HEAD WITHOUT CONTRAST  TECHNIQUE: Multiplanar,  multiecho pulse sequences of the brain and surrounding structures were obtained without intravenous contrast. Angiographic images of the head were obtained using MRA technique without contrast.  COMPARISON:  Head CT without contrast 1056 hours today. Cervical spine MRI 12/15/2009.  FINDINGS: MRI HEAD FINDINGS  Scattered areas of restricted diffusion in the left MCA territory. The most confluent involvement is in the left basal ganglia. There is also left superior frontal gyrus pre motor cortex involvement. There is mild involvement in the left parietal lobe. There is also suggestion of left middle temporal gyrus involvement.  No contralateral or posterior fossa restricted diffusion. Major intracranial vascular flow voids are within normal limits.  Mild T2 and FLAIR hyperintensity in the acutely affected areas. There is petechial hemorrhage in the left basal ganglia. No significant mass effect. There is also a small focus of petechial hemorrhage or chronic micro hemorrhage in the left temporal lobe.  No midline shift, mass effect, evidence of mass lesion, ventriculomegaly, or extra-axial collection. Cervicomedullary junction and pituitary are within normal limits. Elsewhere gray and white matter signal is normal for age. Visible internal auditory structures appear normal. Trace retained secretions in the nasopharynx. Mastoids are clear. Paranasal sinus mucosal thickening most pronounced in the sphenoid. Orbits soft tissues are within normal limits. Negative scalp soft tissues. Visualized cervical spine remarkable for partially visible ACDF hardware with mild susceptibility artifact.  MRA HEAD FINDINGS  Antegrade flow in the posterior circulation. Codominant distal vertebral arteries. Normal PICA origins. Normal vertebrobasilar junction. No basilar stenosis. Normal SCA and right PCA origins. Fetal type left PCA origin. Diminutive or absent right posterior communicating artery. Bilateral PCA branches are within normal  limits.  Antegrade flow in both ICA siphons. No siphon stenosis. Ophthalmic and posterior communicating artery origins are within normal limits. Carotid termini appear normal. MCA and ACA origins are within normal limits. Anterior communicating artery and visualized bilateral ACA branches are within normal limits. Right MCA M1 segment and branches are within normal limits.  Left MCA M1 segment and bifurcation are within normal limits. No major left MCA branch occlusion is identified.  IMPRESSION: 1. Acute left MCA territory infarct, most confluent involvement in the left basal ganglia. There is basal ganglia petechial hemorrhage, but no significant mass effect. 2. Negative intracranial MRA. No left MCA branch occlusion identified. 3. Aside from the acute findings, normal for age noncontrast MRI appearance of the brain.   Electronically Signed   By: Odessa Fleming M.D.   On: 06/13/2015 18:21   Dg Swallowing Func-speech Pathology  06/15/2015    Objective Swallowing Evaluation:   MBS Patient Details  Name: Holly Sparks MRN: [id number] Date of Birth: Jun 18, 1941  Today's Date: 06/15/2015 Time: SLP Start Time (ACUTE ONLY): 0953-SLP Stop Time (ACUTE ONLY): 1015 SLP Time Calculation (min) (ACUTE ONLY): 22 min  Past Medical History:  Past Medical History  Diagnosis Date  . Hypertension     x  10 yrs  . 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 High cholesterol   . Presence of permanent cardiac pacemaker   . Paroxysmal a-fib: Remote hx of afib 06/15/2015   Past Surgical History:  Past Surgical History  Procedure Laterality Date  . Neck surgery      x 2 for spur and disc problems  . Colonoscopy    . Colonoscopy N/A 05/08/2013    Procedure: COLONOSCOPY;  Surgeon: Malissa Hippo, MD;  Location: [facility];  Service: Endoscopy;  Laterality: N/A;  1015   HPI:  Other Pertinent Information: Malli Falotico is a 74 y.o. female, with  hypertension, hyperlipidemia, diabetes and recently an irregular  heartbeat, presented to ER with difficulty  speaking. Diagnosed with left  MCA CVA.   No Data Recorded  Assessment / Plan / Recommendation CHL IP CLINICAL IMPRESSIONS 06/15/2015  Therapy Diagnosis Mild oral phase dysphagia;Mild pharyngeal phase  dysphagia  Clinical Impression Patient presents with a mild sensory motor based  oropharyngeal dysphagia. Patient with a mildly delayed swallow initiation  and decreased laryngeal closure resulting in silent aspiration of thin  liquids. Cueing for chin tuck reduced aspiration to penetration, right  head turn prevented aspiration during todays study however patient  required moderate cues for accurate usage. Mild pharyngeal residuals noted  post swallow may be dur to oro-pharyngeal weakness and/or presence of  cervical hardware at C3-5. Residuals cleared with spontaneous dry swallows  and did not significantly impact overall function. Given communication and  cognitive deficits impacting carryover of newly learned information,  recommend continuing use of thickened liquids with therapeutic trials of  upgraded liquids at bedside.       CHL IP TREATMENT RECOMMENDATION 06/15/2015  Treatment Recommendations Therapy as outlined in treatment plan below     CHL IP DIET RECOMMENDATION 06/15/2015  SLP Diet Recommendations Dysphagia 3 (Mech soft);Nectar  Liquid Administration via (None)  Medication Administration Whole meds with puree  Compensations Slow rate;Small sips/bites  Postural Changes and/or Swallow Maneuvers (None)     CHL IP OTHER RECOMMENDATIONS 06/15/2015  Recommended Consults (None)  Oral Care Recommendations Oral care BID  Other Recommendations Order thickener from pharmacy;Prohibited food  (jello, ice cream, thin soups);Remove water pitcher     No flowsheet data found.   CHL IP FREQUENCY AND DURATION 06/15/2015  Speech Therapy Frequency (ACUTE ONLY) min 2x/week  Treatment Duration 2 weeks           Ferdinand Lango MA, CCC-SLP [phone]        McCoy Leah Meryl 06/15/2015, 11:22 AM     Microbiology: Recent Results  (from the past 240 hour(s))  Urine culture     Status: None   Collection Time: 06/13/15 11:41 AM  Result Value Ref Range Status   Specimen Description URINE, CATHETERIZED  Final   Special Requests NONE  Final   Culture NO GROWTH 1 DAY  Final   Report Status 06/14/2015 FINAL  Final     Labs: Basic Metabolic Panel:  Recent Labs Lab 06/13/15 1026 06/13/15 1035 06/14/15 0230 06/15/15 0550 06/16/15 0505  NA 137 138 136 138 139  K 4.6 4.5 4.0 3.8 4.1  CL 103 102 101 106 104  CO2 24  --  30 25 29   GLUCOSE 264* 264* 156* 117* 96  BUN 23* 25* 14 8 8   CREATININE 1.47* 1.20* 1.05* 0.92 1.01*  CALCIUM 9.3  --  8.8* 8.7* 9.1   Liver Function Tests:  Recent Labs  Lab 06/13/15 1026  AST 22  ALT 13*  ALKPHOS 71  BILITOT 0.7  PROT 6.1*  ALBUMIN 3.5   No results for input(s): LIPASE, AMYLASE in the last 168 hours. No results for input(s): AMMONIA in the last 168 hours. CBC:  Recent Labs Lab 06/13/15 1026 06/13/15 1035 06/15/15 0550 06/16/15 0505  WBC 7.5  --  5.0 4.9  NEUTROABS 5.6  --   --   --   HGB 13.7 14.3 13.3 13.7  HCT 39.8 42.0 38.0 39.9  MCV 84.3  --  83.9 84.9  PLT 127*  --  123* 139*   Cardiac Enzymes:  Recent Labs Lab 06/13/15 1026 06/13/15 1523 06/13/15 2045 06/14/15 0230  CKTOTAL 50  --   --   --   TROPONINI  --  <0.03 <0.03 <0.03   BNP: BNP (last 3 results) No results for input(s): BNP in the last 8760 hours.  ProBNP (last 3 results) No results for input(s): PROBNP in the last 8760 hours.  CBG:  Recent Labs Lab 06/16/15 0022 06/16/15 0321 06/16/15 0735 06/16/15 1115 06/16/15 1627  GLUCAP 105* 90 108* 153* 152*       Signed:  Negin Hegg MD [facility] 06/16/2015, 5:01 PM

## 2015-06-16 NOTE — PMR Pre-admission (Signed)
PMR Admission Coordinator Pre-Admission Assessment  Patient: Holly Sparks is an 74 y.o., female MRN: [id number] DOB: December 06, 1940 Height:  (167.6 cm) Weight: 71.215 kg (157 lb)              Insurance Information HMO: No    PPO:       PCP:       IPA:       80/20:       OTHER:  Group # [id number] PRIMARY: U[employer] Medicare      Policy#: [id number]      Subscriber: [name] CM Name: Volanda Napoleon      Phone#: [phone]     Fax#: [phone] Pre-Cert#: [id number]      Employer: Retired Benefits:  Phone #: [phone]     Name: On line Eff. Date: 11/28/14     Deduct: $0      Out of Pocket Max: $4900 [phone] remaining)      Life Max: unlimited CIR: $345 days 1-4      SNF: $0 days 1-20; $160 days 21-51; $0 days 52-100 Outpatient: No limits     Co-Pay: $40/visit Home Health: 100%      Co-Pay: none DME: 80%     Co-Pay: 20% Providers: in Surveyor, quantity Information    Name Relation Home Work Mobile   Sahni,Bobby Spouse [phone]  [phone]     Current Medical History  Patient Admitting Diagnosis:  L MCA infarct  History of Present Illness: A 74 y.o. right handed female with history of hypertension, diabetes mellitus with peripheral neuropathy as well as history of arrhythmia/atrial fibrillation seen by [facility] cardiology in the past maintained on metoprolol as well as aspirin. Independent prior to admission living with her husband. Presented 06/13/2015 with speech difficulties and inability to follow commands. Cranial CT scan negative. EKG showed T-wave inversions. Troponin negative. Patient did not receive TPA. MRI of the brain showed acute left MCA territory infarcts most confluent involvement in the left basal ganglia. MRA of the head negative. Echocardiogram with ejection fraction of 65% grade 1 diastolic dysfunction. Carotid Dopplers with no ICA stenosis. Neurology services consulted presently maintained on Eliquis CVA prophylaxis. Dysphagia  1 nectar liquids. Physical and occupational therapy evaluations completed 06/14/2015 with recommendations of physical medicine rehabilitation consult. Patient to be admitted for comprehensive inpatient rehabilitation program.     Total: 2=NIH  Past Medical History  Past Medical History  Diagnosis Date  . Hypertension     x 10 yrs  . 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 High cholesterol   . Presence of permanent cardiac pacemaker   . Paroxysmal a-fib: Remote hx of afib 06/15/2015    Family History  family history is not on file.  Prior Rehab/Hospitalizations:  Has the patient had major surgery during 100 days prior to admission? No  Current Medications   Current facility-administered medications:  .  acetaminophen (TYLENOL) tablet 650 mg, 650 mg, Oral, Q4H PRN, 650 mg at 06/14/15 0236 **OR** acetaminophen (TYLENOL) suppository 650 mg, 650 mg, Rectal, Q4H PRN, Tora Kindred York, PA-C .  apixaban (ELIQUIS) tablet 5 mg, 5 mg, Oral, BID, Bertram Millard, RPH, 5 mg at 06/16/15 1116 .  atorvastatin (LIPITOR) tablet 20 mg, 20 mg, Oral, QHS, Rodolph Bong, MD, 20 mg at 06/15/15 2104 .  hydrALAZINE (APRESOLINE) injection 5 mg, 5 mg, Intravenous, Q6H PRN, Tora Kindred York, PA-C .  lisinopril (PRINIVIL,ZESTRIL) tablet 20 mg, 20 mg, Oral, Daily,  20 mg at 06/16/15 1116 **AND** hydrochlorothiazide (HYDRODIURIL) tablet 25 mg, 25 mg, Oral, Daily, Ramiro Harvestaniel Thompson V, MD, 25 mg at 06/16/15 1116 .  hyoscyamine (LEVSIN SL) SL tablet 0.125 mg, 0.125 mg, Sublingual, Q4H PRN, Ramiro Harvestaniel Thompson V, MD .  insulin aspart (novoLOG) injection 0-9 Units, 0-9 Units, Subcutaneous, 6 times per day, Stephani PoliceMarianne L York, PA-C, 1 Units at 06/15/15 2048 .  insulin glargine (LANTUS) injection 10 Units, 10 Units, Subcutaneous, QHS, Stephani PoliceMarianne L York, PA-C, 10 Units at 06/15/15 2105 .  LORazepam (ATIVAN) tablet 0.5 mg, 0.5 mg, Oral, TID PRN, Ramiro Harvestaniel Thompson V, MD .  metoprolol tartrate (LOPRESSOR) tablet 12.5 mg, 12.5 mg, Oral, Daily,  Ramiro Harvestaniel Thompson V, MD, 12.5 mg at 06/16/15 1116 .  pantoprazole (PROTONIX) EC tablet 40 mg, 40 mg, Oral, Daily, Ramiro Harvestaniel Thompson V, MD, 40 mg at 06/16/15 1116 .  RESOURCE THICKENUP CLEAR, , Oral, PRN, Rodolph Bonganiel Thompson V, MD .  traMADol Janean Sark(ULTRAM) tablet 50 mg, 50 mg, Oral, Q6H PRN, Rodolph Bonganiel Thompson V, MD  Patients Current Diet: DIET DYS 3 Room service appropriate?: Yes; Fluid consistency:: Nectar Thick  Precautions / Restrictions Precautions Precautions: Fall Restrictions Weight Bearing Restrictions: No   Has the patient had 2 or more falls or a fall with injury in the past year?Yes.  Went to her doctor for a back injury from a fall.  Prior Activity Level Limited Community (1-2x/wk): Went out 2-3 X a week.  Was driving.  Home Assistive Devices / Equipment Home Assistive Devices/Equipment: CBG Meter Home Equipment: None  Prior Device Use: Indicate devices/aids used by the patient prior to current illness, exacerbation or injury? None of the above  Prior Functional Level Prior Function Level of Independence: Independent Comments: Driving  Self Care: Did the patient need help bathing, dressing, using the toilet or eating?  Independent  Indoor Mobility: Did the patient need assistance with walking from room to room (with or without device)? Independent  Stairs: Did the patient need assistance with internal or external stairs (with or without device)? Independent  Functional Cognition: Did the patient need help planning regular tasks such as shopping or remembering to take medications? Independent  Current Functional Level Cognition  Arousal/Alertness: Awake/alert Overall Cognitive Status: Difficult to assess Difficult to assess due to: Impaired communication Current Attention Level: Focused (progressing to sustained) Orientation Level: Oriented to person, Oriented to time, Disoriented to place, Oriented to situation Following Commands: Follows multi-step commands  consistently General Comments: Pt with word finding diffulties and perseveration requiring VC and Tactile cues for using call light to call nurse. Pt unable to problem solve and identfy red button without Max VC. Attention: Sustained Sustained Attention: Impaired Sustained Attention Impairment: Verbal complex, Functional complex Memory:  (difficult to fully assess due to aphasia) Awareness: Impaired Awareness Impairment: Intellectual impairment Problem Solving:  (difficult to assess due to aphasia)    Extremity Assessment (includes Sensation/Coordination)  Upper Extremity Assessment: Generalized weakness  Lower Extremity Assessment: Defer to PT evaluation RLE Deficits / Details: Strength grossly 4/5; decreased stability in stance    ADLs  Overall ADL's : Needs assistance/impaired Eating/Feeding: Set up, Sitting Grooming: Wash/dry hands, Wash/dry face, Oral care, Minimal assistance, Standing Upper Body Bathing: Minimal assitance, Sitting Lower Body Bathing: Minimal assistance, Sit to/from stand Upper Body Dressing : Minimal assistance, Sitting Lower Body Dressing: Minimal assistance, Sit to/from stand Toilet Transfer: Minimal assistance, Stand-pivot, BSC Toileting- Clothing Manipulation and Hygiene: Minimal assistance, Sit to/from stand Functional mobility during ADLs: Minimal assistance General ADL Comments: Assist for balance  Mobility  Overal bed mobility: Needs Assistance Bed Mobility: Rolling, Sidelying to Sit, Sit to Supine Rolling: Min assist Sidelying to sit: Mod assist Sit to supine: Min assist General bed mobility comments: Pt recieved in chair    Transfers  Overall transfer level: Needs assistance Equipment used: 1 person hand held assist Transfers: Sit to/from Stand, Stand Pivot Transfers Sit to Stand: Min assist Stand pivot transfers: Min assist General transfer comment: Pt requires min A for balance     Ambulation / Gait / Stairs / Wheelchair Mobility   Ambulation/Gait Ambulation/Gait assistance: Architect (Feet): 250 Feet Assistive device: Rolling walker (2 wheeled) Gait Pattern/deviations: Step-through pattern, Decreased stride length, Narrow base of support General Gait Details: pt was able to ambulate 250 ft however required min vc to increase step lenght and look foward to encourage normal gait. Gait velocity: slow Gait velocity interpretation: Below normal speed for age/gender Stairs: Yes Stairs assistance: Min assist Stair Management: One rail Left Number of Stairs: 1 General stair comments: pt able to ascend and descend 1 stair fowards with Min Vc to hold on to railing and hip and knee flexion to safely clear step.    Posture / Balance Dynamic Sitting Balance Sitting balance - Comments: Min assist to maintain balance Balance Overall balance assessment: Needs assistance Sitting-balance support: Feet supported Sitting balance-Leahy Scale: Good Sitting balance - Comments: Min assist to maintain balance Standing balance support: During functional activity Standing balance-Leahy Scale: Poor Standing balance comment: requires assist     Special needs/care consideration BiPAP/CPAP No CPM No Continuous Drip IV No Dialysis No        Life Vest No Oxygen No Special Bed No Trach Size No Wound Vac (area) No      Skin Has a rash on her left hand                              Bowel mgmt: Last BM 06/13/15 Bladder mgmt: Voiding up in bathroom with assistance Diabetic mgmt Yes, on metformin at home.    Previous Home Environment Living Arrangements: Spouse/significant other  Lives With: Spouse Available Help at Discharge: Family, Available 24 hours/day (Husband is 24.  Able to provide supervision).  One son lives in [place], one in [place] and a daughter in [place]. Type of Home: House Home Layout: Two level, Able to live on main level with bedroom/bathroom Home Access: Stairs to enter Entrance  Stairs-Rails: None Entrance Stairs-Number of Steps: 1 Bathroom Shower/Tub: Engineer, manufacturing systems: Standard Home Care Services: No Additional Comments: Information provided primarily by daughter  Discharge Living Setting Plans for Discharge Living Setting: Patient's home, House, Lives with (comment) (Lives with husband.) Type of Home at Discharge: House Discharge Home Layout: One level Discharge Home Access: Stairs to enter Entrance Stairs-Number of Steps: 1 step up to porch. Does the patient have any problems obtaining your medications?: No  Social/Family/Support Systems Patient Roles: Spouse, Parent (Has a husband, 2 sons and 1 daughter.) Contact Information: Kennedi Lizardo - spouse Anticipated Caregiver: Husband Anticipated Caregiver's Contact Information: Reita Cliche - (h) [phone] (c) [phone] Ability/Limitations of Caregiver: Husband is retired and can assist. Engineer, structural Availability: 24/7 Discharge Plan Discussed with Primary Caregiver: Yes Is Caregiver In Agreement with Plan?: Yes Does Caregiver/Family have Issues with Lodging/Transportation while Pt is in Rehab?: No  Goals/Additional Needs Patient/Family Goal for Rehab: PT/OT/St  mod I goals Expected length of stay: 10-14 days Cultural Considerations: [employer] Dietary Needs:  Dys 3, nectar thick liquids Equipment Needs: TBD Pt/Family Agrees to Admission and willing to participate: Yes Program Orientation Provided & Reviewed with Pt/Caregiver Including Roles  & Responsibilities: Yes  Decrease burden of Care through IP rehab admission: N/A  Possible need for SNF placement upon discharge: Not planned  Patient Condition: This patient's condition remains as documented in the consult dated 06/15/15, in which the Rehabilitation Physician determined and documented that the patient's condition is appropriate for intensive rehabilitative care in an inpatient rehabilitation facility. Will admit to inpatient  rehab today.  Preadmission Screen Completed By:  Trish Mage, 06/16/2015 12:28 PM ______________________________________________________________________   Discussed status with Dr. Riley Kill on 06/16/15 at 1226 and received telephone approval for admission today.  Admission Coordinator:  Trish Mage, time1226/Date07/19/16

## 2015-06-16 NOTE — Progress Notes (Signed)
Pt admitted to rehab. Admission information given to pt and pt left resting with family at bedside. Pt is comfortable at this time.

## 2015-06-17 ENCOUNTER — Inpatient Hospital Stay (HOSPITAL_COMMUNITY): Payer: Medicare Other | Admitting: Occupational Therapy

## 2015-06-17 ENCOUNTER — Inpatient Hospital Stay (HOSPITAL_COMMUNITY): Payer: Medicare Other

## 2015-06-17 ENCOUNTER — Inpatient Hospital Stay (HOSPITAL_COMMUNITY): Payer: Medicare Other | Admitting: Speech Pathology

## 2015-06-17 DIAGNOSIS — I10 Essential (primary) hypertension: Secondary | ICD-10-CM

## 2015-06-17 DIAGNOSIS — E1165 Type 2 diabetes mellitus with hyperglycemia: Secondary | ICD-10-CM

## 2015-06-17 LAB — GLUCOSE, CAPILLARY
GLUCOSE-CAPILLARY: 110 mg/dL — AB (ref 65–99)
GLUCOSE-CAPILLARY: 132 mg/dL — AB (ref 65–99)
GLUCOSE-CAPILLARY: 170 mg/dL — AB (ref 65–99)
GLUCOSE-CAPILLARY: 212 mg/dL — AB (ref 65–99)
Glucose-Capillary: 161 mg/dL — ABNORMAL HIGH (ref 65–99)
Glucose-Capillary: 202 mg/dL — ABNORMAL HIGH (ref 65–99)
Glucose-Capillary: 160 mg/dL — ABNORMAL HIGH (ref 65–99)

## 2015-06-17 LAB — COMPREHENSIVE METABOLIC PANEL
ALK PHOS: 68 U/L (ref 38–126)
ALT: 15 U/L (ref 14–54)
AST: 26 U/L (ref 15–41)
Albumin: 3.2 g/dL — ABNORMAL LOW (ref 3.5–5.0)
Anion gap: 10 (ref 5–15)
BILIRUBIN TOTAL: 1 mg/dL (ref 0.3–1.2)
BUN: 11 mg/dL (ref 6–20)
CALCIUM: 9 mg/dL (ref 8.9–10.3)
CO2: 25 mmol/L (ref 22–32)
Chloride: 101 mmol/L (ref 101–111)
Creatinine, Ser: 1.01 mg/dL — ABNORMAL HIGH (ref 0.44–1.00)
GFR calc non Af Amer: 54 mL/min — ABNORMAL LOW (ref >60)
Glucose, Bld: 113 mg/dL — ABNORMAL HIGH (ref 65–99)
Potassium: 3.3 mmol/L — ABNORMAL LOW (ref 3.5–5.1)
Sodium: 136 mmol/L (ref 135–145)
TOTAL PROTEIN: 6.2 g/dL — AB (ref 6.5–8.1)

## 2015-06-17 LAB — CBC WITH DIFFERENTIAL/PLATELET
Basophils Absolute: 0 10*3/uL (ref 0.0–0.1)
Basophils Relative: 1 % (ref 0–1)
EOS PCT: 2 % (ref 0–5)
Eosinophils Absolute: 0.1 10*3/uL (ref 0.0–0.7)
HCT: 41.7 % (ref 36.0–46.0)
Hemoglobin: 14.2 g/dL (ref 12.0–15.0)
LYMPHS ABS: 1.8 10*3/uL (ref 0.7–4.0)
LYMPHS PCT: 31 % (ref 12–46)
MCH: 29.2 pg (ref 26.0–34.0)
MCHC: 34.1 g/dL (ref 30.0–36.0)
MCV: 85.8 fL (ref 78.0–100.0)
MONO ABS: 0.4 10*3/uL (ref 0.1–1.0)
Monocytes Relative: 7 % (ref 3–12)
Neutro Abs: 3.4 10*3/uL (ref 1.7–7.7)
Neutrophils Relative %: 59 % (ref 43–77)
Platelets: 138 10*3/uL — ABNORMAL LOW (ref 150–400)
RBC: 4.86 MIL/uL (ref 3.87–5.11)
RDW: 13.2 % (ref 11.5–15.5)
WBC: 5.8 10*3/uL (ref 4.0–10.5)

## 2015-06-17 MED ORDER — POTASSIUM CHLORIDE CRYS ER 20 MEQ PO TBCR
20.0000 meq | EXTENDED_RELEASE_TABLET | Freq: Two times a day (BID) | ORAL | Status: AC
  Administered 2015-06-17 – 2015-06-18 (×4): 20 meq via ORAL
  Filled 2015-06-17 (×2): qty 1
  Filled 2015-06-17: qty 2
  Filled 2015-06-17: qty 1

## 2015-06-17 MED ORDER — INSULIN ASPART 100 UNIT/ML ~~LOC~~ SOLN
0.0000 [IU] | Freq: Three times a day (TID) | SUBCUTANEOUS | Status: DC
  Administered 2015-06-17 (×2): 3 [IU] via SUBCUTANEOUS
  Administered 2015-06-18: 1 [IU] via SUBCUTANEOUS
  Administered 2015-06-18: 2 [IU] via SUBCUTANEOUS
  Administered 2015-06-18: 3 [IU] via SUBCUTANEOUS
  Administered 2015-06-19 (×2): 1 [IU] via SUBCUTANEOUS
  Administered 2015-06-19 – 2015-06-20 (×3): 2 [IU] via SUBCUTANEOUS
  Administered 2015-06-20: 1 [IU] via SUBCUTANEOUS
  Administered 2015-06-21: 3 [IU] via SUBCUTANEOUS
  Administered 2015-06-21: 1 [IU] via SUBCUTANEOUS
  Administered 2015-06-21: 3 [IU] via SUBCUTANEOUS
  Administered 2015-06-22 (×2): 2 [IU] via SUBCUTANEOUS
  Administered 2015-06-22 – 2015-06-23 (×2): 1 [IU] via SUBCUTANEOUS
  Administered 2015-06-23: 2 [IU] via SUBCUTANEOUS
  Administered 2015-06-24 (×2): 1 [IU] via SUBCUTANEOUS
  Administered 2015-06-24: 2 [IU] via SUBCUTANEOUS
  Administered 2015-06-25 (×2): 1 [IU] via SUBCUTANEOUS

## 2015-06-17 MED ORDER — INSULIN ASPART 100 UNIT/ML ~~LOC~~ SOLN
0.0000 [IU] | Freq: Every day | SUBCUTANEOUS | Status: DC
  Administered 2015-06-19 – 2015-06-23 (×3): 2 [IU] via SUBCUTANEOUS

## 2015-06-17 NOTE — Progress Notes (Signed)
Patient information reviewed and entered into eRehab system by Shantinique Picazo, RN, CRRN, PPS Coordinator.  Information including medical coding and functional independence measure will be reviewed and updated through discharge.     Per nursing patient was given "Data Collection Information Summary for Patients in Inpatient Rehabilitation Facilities with attached "Privacy Act Statement-Health Care Records" upon admission.  

## 2015-06-17 NOTE — Evaluation (Addendum)
Speech Language Pathology Assessment and Plan  Patient Details  Name: Holly Sparks MRN: [id number] Date of Birth: 1941/03/06  SLP Diagnosis: Aphasia;Dysphagia;Cognitive Impairments  Rehab Potential: Excellent ELOS: 7-10 days     Today's Date: 06/17/2015 SLP Individual Time: 0900-1000 SLP Individual Time Calculation (min): 60 min   Problem List:  Patient Active Problem List   Diagnosis Date Noted  . 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 Paroxysmal a-fib: Remote hx of afib 06/15/2015  . Cerebral infarction due to embolism of left middle cerebral artery   . Essential hypertension   . Stroke-like symptoms 06/13/2015  . HLD (hyperlipidemia) 06/13/2015  . DDD (degenerative disc disease), cervical 06/13/2015  . Elevated serum creatinine 06/13/2015  . Aphasia 06/13/2015  . Expressive aphasia   . Cerebral infarction due to unspecified mechanism   . Type 2 diabetes mellitus without complication   . IBS (irritable bowel syndrome) 04/24/2013  . High cholesterol 04/24/2013  . Diabetes 04/24/2013  . Essential hypertension, benign 04/24/2013   Past Medical History:  Past Medical History  Diagnosis Date  . Hypertension     x 10 yrs  . 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 High cholesterol   . Presence of permanent cardiac pacemaker   . Paroxysmal a-fib: Remote hx of afib 06/15/2015   Past Surgical History:  Past Surgical History  Procedure Laterality Date  . Neck surgery      x 2 for spur and disc problems  . Colonoscopy    . Colonoscopy N/A 05/08/2013    Procedure: COLONOSCOPY;  Surgeon: Rogene Houston, MD;  Location: [facility];  Service: Endoscopy;  Laterality: N/A;  1015    Assessment / Plan / Recommendation Clinical Impression Patient is a 74 y.o. right handed female with history of hypertension, diabetes mellitus with peripheral neuropathy as well as history of arrhythmia/atrial fibrillation seen by [facility] cardiology in the past maintained on metoprolol  as well as aspirin. Independent prior to admission living with her husband. Presented 06/13/2015 with speech difficulties and inability to follow commands. Cranial CT scan negative. EKG showed T-wave inversions. Troponin negative. Patient did not receive TPA. MRI of the brain showed acute left MCA territory infarcts most confluent involvement in the left basal ganglia. Physical and occupational therapy evaluations completed 06/14/2015 with recommendations of physical medicine rehabilitation consult. Patient was admitted for comprehensive rehabilitation program on 06/16/15 and was administered a cognitive-linguistic evaluation and BSE. Patient presents with a moderate global aphasia impacting all four modalities of language. Verbal errors are characterized by phonemic paraphasias, neologisms and perseveration with limited awareness of errors. Patient also demonstrates mild deficits in sustained attention, safety and awareness. Memory and problem solving are difficult to assess at this time due to language impairments. Patient demonstrates mild lingual and labial weakness resulting in mild right anterior spillage of liquids and mild lingual residue with solid textures. Patient did not demonstrate any overt s/s of aspiration with thin or nectar-thick liquids, however, patient demonstrated silent aspiration with thin liquids per MBS on 7/18, therefore, recommend patient continue current diet of Dys. 3 textures with nectar-thick liquids. Patient would benefit from skilled SLP intervention to maximize swallowing function and functional communication in order to maximize her overall functional independence prior to discharge.   Skilled Therapeutic Interventions          Administered a cognitive-linguistic evaluation and BSE. Please see above for details.  SLP Assessment  Patient will need skilled [place] [facility] during  CIR admission    Recommendations  SLP Diet Recommendations: Dysphagia 3 (Mech  soft);Nectar Liquid Administration via: Cup Medication Administration: Whole meds with puree Supervision: Patient able to self feed;Intermittent supervision to cue for compensatory strategies Compensations: Slow rate;Small sips/bites Postural Changes and/or Swallow Maneuvers: Seated upright 90 degrees;Upright 30-60 min after meal Oral Care Recommendations: Oral care BID Patient destination: Home Follow up Recommendations: Outpatient SLP;24 hour supervision/assistance Equipment Recommended: None recommended by SLP    SLP Frequency 3 to 5 out of 7 days   SLP Treatment/Interventions Cognitive remediation/compensation;Cueing hierarchy;Dysphagia/aspiration precaution training;Environmental controls;Functional tasks;Internal/external aids;Patient/family education;Speech/Language facilitation;Therapeutic Activities    Pain No/Denies Pain  Prior Functioning Type of Home: House  Lives With: Spouse Available Help at Discharge: Family;Available 24 hours/day  Short Term Goals: Week 1: SLP Short Term Goal 1 (Week 1): Patient will sustain attention to functional and familiar tasks for 10 minutes with min A verbal cues for redirection.  SLP Short Term Goal 2 (Week 1): Patient will self-monitor verbal errors with Mod A question and verbal cues.  SLP Short Term Goal 3 (Week 1): Patient will follow 2 step commands with 75% accuracy and Mod A multimodal cues.  SLP Short Term Goal 4 (Week 1): Patient will name functional items with 75% accuracy with Min A multimodal cues.  SLP Short Term Goal 5 (Week 1): Patient will match a written word to an item from a field of 2 with 75% accuracy and Min A multimodal cues.  SLP Short Term Goal 6 (Week 1): Patient will consume trials of thin liquids without overt s/s of aspiration with supervision cues over 3 conescutive sessions to assess readiness for repeat MBS.   See FIM for current functional status Refer to Care Plan for Long Term Goals  Recommendations for  other services: None  Discharge Criteria: Patient will be discharged from SLP if patient refuses treatment 3 consecutive times without medical reason, if treatment goals not met, if there is a change in medical status, if patient makes no progress towards goals or if patient is discharged from hospital.  The above assessment, treatment plan, treatment alternatives and goals were discussed and mutually agreed upon: by patient and by family  Leiby Pigeon 06/17/2015, 4:15 PM

## 2015-06-17 NOTE — Evaluation (Signed)
Physical Therapy Assessment and Plan  Patient Details  Name: Holly Sparks MRN: [id number] Date of Birth: Apr 08, 1941  PT Diagnosis: Ataxia, Difficulty walking, Impaired cognition and Muscle weakness Rehab Potential: Excellent ELOS: ~ 7 days   Today's Date: 06/17/2015 PT Individual Time: 1300-1400 PT Individual Time Calculation (min): 60 min    Problem List:  Patient Active Problem List   Diagnosis Date Noted  . 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 Paroxysmal a-fib: Remote hx of afib 06/15/2015  . Cerebral infarction due to embolism of left middle cerebral artery   . Essential hypertension   . Stroke-like symptoms 06/13/2015  . HLD (hyperlipidemia) 06/13/2015  . DDD (degenerative disc disease), cervical 06/13/2015  . Elevated serum creatinine 06/13/2015  . Aphasia 06/13/2015  . Expressive aphasia   . Cerebral infarction due to unspecified mechanism   . Type 2 diabetes mellitus without complication   . IBS (irritable bowel syndrome) 04/24/2013  . High cholesterol 04/24/2013  . Diabetes 04/24/2013  . Essential hypertension, benign 04/24/2013    Past Medical History:  Past Medical History  Diagnosis Date  . Hypertension     x 10 yrs  . 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 High cholesterol   . Presence of permanent cardiac pacemaker   . Paroxysmal a-fib: Remote hx of afib 06/15/2015   Past Surgical History:  Past Surgical History  Procedure Laterality Date  . Neck surgery      x 2 for spur and disc problems  . Colonoscopy    . Colonoscopy N/A 05/08/2013    Procedure: COLONOSCOPY;  Surgeon: Rogene Houston, MD;  [facility];  Service: Endoscopy;  Laterality: N/A;  1015    Assessment & Plan Clinical Impression: Patient is a 74 y.o. year old female with recent admission to the hospital with history of hypertension, diabetes mellitus with peripheral neuropathy as well as history of arrhythmia/atrial fibrillation seen by [facility] cardiology in the  past maintained on metoprolol as well as aspirin. Independent prior to admission living with her husband. Presented 06/13/2015 with speech difficulties and inability to follow commands. Cranial CT scan negative. EKG showed T-wave inversions. Troponin negative. Patient did not receive TPA. MRI of the brain showed acute left MCA territory infarcts most confluent involvement in the left basal ganglia. MRA of the head negative. Echocardiogram with ejection fraction of 39% grade 1 diastolic dysfunction. Carotid Dopplers with no ICA stenosis. Neurology services consulted presently maintained on Eliquis CVA prophylaxis. Dysphagia 1 nectar liquids. Physical and occupational therapy evaluations completed 06/14/2015 with recommendations of physical medicine rehabilitation consult.  Patient transferred to CIR on 06/16/2015 .   Patient currently requires min with mobility secondary to muscle weakness and muscle joint tightness, decreased cardiorespiratoy endurance, unbalanced muscle activation, ataxia and decreased coordination, decreased attention, decreased awareness and decreased safety awareness and decreased sitting balance, decreased standing balance and decreased balance strategies.  Prior to hospitalization, patient was independent  with mobility and lived with Spouse in a House home.  Home access is 1Stairs to enter.  Patient will benefit from skilled PT intervention to maximize safe functional mobility, minimize fall risk and decrease caregiver burden for planned discharge home with 24 hour supervision.  Anticipate patient will benefit from follow up OP at discharge.  PT - End of Session Activity Tolerance: Decreased this session Endurance Deficit: Yes Endurance Deficit Description: rest breaks needed throughout session  PT Assessment Rehab Potential (ACUTE/IP ONLY): Excellent PT Patient demonstrates impairments in  the following area(s): Balance;Endurance;Motor;Safety PT Transfers Functional Problem(s):  Bed to Chair;Bed Mobility;Car;Furniture PT Locomotion Functional Problem(s): Ambulation;Stairs PT Plan PT Intensity: Minimum of 1-2 x/day ,45 to 90 minutes PT Frequency: 5 out of 7 days PT Duration Estimated Length of Stay: ~ 7 days PT Treatment/Interventions: Ambulation/gait training;Balance/vestibular training;Cognitive remediation/compensation;Community reintegration;Discharge planning;Disease management/prevention;DME/adaptive equipment instruction;Functional mobility training;Neuromuscular re-education;Pain management;Patient/family education;Psychosocial support;Splinting/orthotics;Stair training;Therapeutic Activities;Therapeutic Exercise;UE/LE Strength taining/ROM;UE/LE Coordination activities;Wheelchair propulsion/positioning PT Transfers Anticipated Outcome(s): mod I to S level PT Locomotion Anticipated Outcome(s): S gait PT Recommendation Follow Up Recommendations: Outpatient PT;24 hour supervision/assistance Patient destination: Home Equipment Recommended: To be determined (AD for gait (RW v Cane))  Skilled Therapeutic Intervention Individual therapy initiated with focus on functional transfer training with focus on safe technique and challenging balance to minimize use of UEs, gait training without AD to challenge balance (see below for gait details), administered Berg Balance Test (see results below), stair negotiation for community mobility training, overall endurance/activity tolerance, safety awareness, and educating on overall PT POC and goals. Pt with difficulty with expression which was often limiting during session with some cognitive deficits noted as well.   PT Evaluation Precautions/Restrictions Precautions Precautions: Fall Restrictions Weight Bearing Restrictions: No Pain  denies pain. Home Living/Prior Functioning Home Living Living Arrangements: Spouse/significant other Available Help at Discharge: Family;Available 24 hours/day Type of Home: House Home  Access: Stairs to enter [employer] of Steps: 1 Entrance Stairs-Rails: None Home Layout: Two level;Able to live on main level with bedroom/bathroom  Lives With: Spouse Prior Function Level of Independence: Independent with homemaking with ambulation;Independent with basic ADLs;Independent with gait;Independent with transfers Cognition Overall Cognitive Status: Impaired/Different from baseline Safety/Judgment: Impaired  Attention: Impaired Sensation Sensation Light Touch: Appears Intact Coordination Gross Motor Movements are Fluid and Coordinated: No (decreased coordination in BLE functionally) Motor  Motor Motor: Motor impersistence;Ataxia;Other (comment) (mild R sided weakness)  Locomotion  Gait Gait: Yes Gait Pattern: Impaired Gait Pattern: Decreased step length - right;Decreased dorsiflexion - right;Decreased dorsiflexion - left;Decreased stride length;Narrow base of support Gait velocity: 1.13 ft/sec  Trunk/Postural Assessment  Cervical Assessment Cervical Assessment: Within Functional Limits Thoracic Assessment Thoracic Assessment:  (kyphotic) Lumbar Assessment Lumbar Assessment:  (posterior pelvic tilt)  Balance Balance Balance Assessed: Yes Standardized Balance Assessment Standardized Balance Assessment: Berg Balance Test Berg Balance Test Sit to Stand: Able to stand  independently using hands Standing Unsupported: Able to stand 2 minutes with supervision Sitting with Back Unsupported but Feet Supported on Floor or Stool: Able to sit safely and securely 2 minutes Stand to Sit: Uses backs of legs against chair to control descent Transfers: Needs one person to assist Standing Unsupported with Eyes Closed: Able to stand 10 seconds with supervision Standing Ubsupported with Feet Together: Needs help to attain position but able to stand for 30 seconds with feet together From Standing, Reach Forward with Outstretched Arm: Reaches forward but needs  supervision From Standing Position, Pick up Object from Floor: Able to pick up shoe, needs supervision From Standing Position, Turn to Look Behind Over each Shoulder: Looks behind one side only/other side shows less weight shift Turn 360 Degrees: Needs close supervision or verbal cueing Standing Unsupported, Alternately Place Feet on Step/Stool: Able to complete >2 steps/needs minimal assist Standing Unsupported, One Foot in Front: Loses balance while stepping or standing Standing on One Leg: Unable to try or needs assist to prevent fall Total Score: 26 Static Sitting Balance Static Sitting - Level of Assistance: 5: Stand by assistance Dynamic Sitting Balance Dynamic Sitting - Level of Assistance:  5: Stand by assistance Static Standing Balance Static Standing - Level of Assistance: 4: Min assist Dynamic Standing Balance Dynamic Standing - Level of Assistance: 4: Min assist Extremity Assessment  RLE Assessment RLE Assessment: Exceptions to [facility] RLE Strength RLE Overall Strength Comments: grossly 3+/5; some difficulty with formal MMT commands/possible mild apraxia? LLE Assessment LLE Assessment: Exceptions to [facility] LLE Strength LLE Overall Strength Comments: grossly 4/5; difficulty with formal MMT commands; possible mild apraxia?  FIM:  FIM - Control and instrumentation engineer Devices: Arm rests Bed/Chair Transfer: 5: Supine > Sit: Supervision (verbal cues/safety issues);5: Sit > Supine: Supervision (verbal cues/safety issues);4: Bed > Chair or W/C: Min A (steadying Pt. > 75%);4: Chair or W/C > Bed: Min A (steadying Pt. > 75%) FIM - Locomotion: Wheelchair Locomotion: Wheelchair: 1: Total Assistance/staff pushes wheelchair (Pt<25%) FIM - Locomotion: Ambulation Locomotion: Ambulation: 2: Travels 50 - 149 ft with minimal assistance (Pt.>75%) FIM - Locomotion: Stairs Locomotion: Scientist, physiological: Hand rail - 2 Locomotion: Stairs: 4: Up and Down 12 - 14 stairs with  minimal assistance (Pt.>75%)   Refer to Care Plan for Long Term Goals  Recommendations for other services: None  Discharge Criteria: Patient will be discharged from PT if patient refuses treatment 3 consecutive times without medical reason, if treatment goals not met, if there is a change in medical status, if patient makes no progress towards goals or if patient is discharged from hospital.  The above assessment, treatment plan, treatment alternatives and goals were discussed and mutually agreed upon: by patient  Juanna Cao, PT, DPT  06/17/2015, 3:51 PM

## 2015-06-17 NOTE — Progress Notes (Signed)
Holly Sparks PHYSICAL MEDICINE & REHABILITATION     PROGRESS NOTE    Subjective/Complaints:   Objective: Vital Signs: Blood pressure 123/69, pulse 47, temperature 98.3 F (36.8 C), temperature source Oral, resp. rate 18, height  (1.676 m), weight 72.485 kg (159 lb 12.8 oz), SpO2 98 %. Dg Swallowing Func-speech Pathology  06/15/2015    Objective Swallowing Evaluation:   MBS Patient Details  Name: Holly Sparks MRN: [id number] Date of Birth: Jul 28, 1941  Today's Date: 06/15/2015 Time: SLP Start Time (ACUTE ONLY): 0953-SLP Stop Time (ACUTE ONLY): 1015 SLP Time Calculation (min) (ACUTE ONLY): 22 min  Past Medical History:  Past Medical History  Diagnosis Date  . Hypertension     x 10 yrs  . 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 High cholesterol   . Presence of permanent cardiac pacemaker   . Paroxysmal a-fib: Remote hx of afib 06/15/2015   Past Surgical History:  Past Surgical History  Procedure Laterality Date  . Neck surgery      x 2 for spur and disc problems  . Colonoscopy    . Colonoscopy N/A 05/08/2013    Procedure: COLONOSCOPY;  Surgeon: Malissa Hippo, MD;  Location: [facility];  Service: Endoscopy;  Laterality: N/A;  1015   HPI:  Other Pertinent Information: Holly Sparks is a 74 y.o. female, with  hypertension, hyperlipidemia, diabetes and recently an irregular  heartbeat, presented to ER with difficulty speaking. Diagnosed with left  MCA CVA.   No Data Recorded  Assessment / Plan / Recommendation CHL IP CLINICAL IMPRESSIONS 06/15/2015  Therapy Diagnosis Mild oral phase dysphagia;Mild pharyngeal phase  dysphagia  Clinical Impression Patient presents with a mild sensory motor based  oropharyngeal dysphagia. Patient with a mildly delayed swallow initiation  and decreased laryngeal closure resulting in silent aspiration of thin  liquids. Cueing for chin tuck reduced aspiration to penetration, right  head turn prevented aspiration during todays study however patient  required moderate cues for  accurate usage. Mild pharyngeal residuals noted  post swallow may be dur to oro-pharyngeal weakness and/or presence of  cervical hardware at C3-5. Residuals cleared with spontaneous dry swallows  and did not significantly impact overall function. Given communication and  cognitive deficits impacting carryover of newly learned information,  recommend continuing use of thickened liquids with therapeutic trials of  upgraded liquids at bedside.       CHL IP TREATMENT RECOMMENDATION 06/15/2015  Treatment Recommendations Therapy as outlined in treatment plan below     CHL IP DIET RECOMMENDATION 06/15/2015  SLP Diet Recommendations Dysphagia 3 (Mech soft);Nectar  Liquid Administration via (None)  Medication Administration Whole meds with puree  Compensations Slow rate;Small sips/bites  Postural Changes and/or Swallow Maneuvers (None)     CHL IP OTHER RECOMMENDATIONS 06/15/2015  Recommended Consults (None)  Oral Care Recommendations Oral care BID  Other Recommendations Order thickener from pharmacy;Prohibited food  (jello, ice cream, thin soups);Remove water pitcher     No flowsheet data found.   CHL IP FREQUENCY AND DURATION 06/15/2015  Speech Therapy Frequency (ACUTE ONLY) min 2x/week  Treatment Duration 2 weeks           Holly Lango MA, CCC-SLP [phone]        Holly Sparks 06/15/2015, 11:22 AM     Recent Labs  06/16/15 0505 06/17/15 0515  WBC 4.9 5.8  HGB 13.7 14.2  HCT 39.9 41.7  PLT 139* 138*    Recent Labs  06/16/15  0505 06/17/15 0515  NA 139 136  K 4.1 3.3*  CL 104 101  GLUCOSE 96 113*  BUN 8 11  CREATININE 1.01* 1.01*  CALCIUM 9.1 9.0   CBG (last 3)   Recent Labs  06/17/15 0015 06/17/15 0409 06/17/15 0825  GLUCAP 132* 110* 161*    Wt Readings from Last 3 Encounters:  06/16/15 72.485 kg (159 lb 12.8 oz)  06/13/15 71.215 kg (157 lb)  10/13/14 71.215 kg (157 lb)    Physical Exam:    Assessment/Plan: 1. Functional deficits secondary to left MCA infarct which require 3+  hours per day of interdisciplinary therapy in a comprehensive inpatient rehab setting. Physiatrist is providing close team supervision and 24 hour management of active medical problems listed below. Physiatrist and rehab team continue to assess barriers to discharge/monitor patient progress toward functional and medical goals. FIM:                   Comprehension Comprehension Mode: Auditory Comprehension: 4-Understands basic 75 - 89% of the time/requires cueing 10 - 24% of the time  Expression Expression: 4-Expresses basic 75 - 89% of the time/requires cueing 10 - 24% of the time. Needs helper to occlude trach/needs to repeat words.  Social Interaction Social Interaction: 5-Interacts appropriately 90% of the time - Needs monitoring or encouragement for participation or interaction.  Problem Solving Problem Solving: 4-Solves basic 75 - 89% of the time/requires cueing 10 - 24% of the time  Memory Memory: 4-Recognizes or recalls 75 - 89% of the time/requires cueing 10 - 24% of the time  Medical Problem List and Plan: 1. Functional deficits secondary to cardioembolic left MCA infarct--begin therapies today 2. DVT Prophylaxis/Anticoagulation: Eliquis. No bleeding sequelae 3. Pain Management: Ultram as needed for breakthrough pain--pain controlled at present 4. Mood/anxiety: Ativan 0.5 mg twice daily as needed. Provide reassurance and + reinforcement 5. Neuropsych: This patient is capable of making decisions on her own behalf. 6. Skin/Wound Care: Routine skin checks. Oob. Discussed nutrition with patient today 7. Fluids/Electrolytes/Nutrition: labs reviewed---add potassium supplement. 8. Dysphagia. Dysphagia #3 nectar thick liquids. Follow-up speech therapy---advance diet as possible 9. Hypertension/atrial fibrillation. Lopressor 12.5 mg daily for bp and rate controlled -bp borderline to normal--continue current doses of lopressor, lisinopril 20 mg daily, hydrochlorothiazide 25  mg daily   -no changes today 10. Diabetes mellitus of peripheral neuropathy. Hemoglobin A1c 11.8. Lantus insulin 10 units daily at bedtime. Check blood sugars before meals and at bedtime. Fair control. May need adjustment to regimen---follow for pattern first.    LOS (Days) 1 A FACE TO FACE EVALUATION WAS PERFORMED  SWARTZ,ZACHARY T 06/17/2015 8:58 AM

## 2015-06-17 NOTE — Progress Notes (Signed)
Social Work  Social Work Assessment and Plan  Patient Details  Name: Holly Sparks MRN: [id number] Date of Birth: Nov 23, 1941  Today's Date: 06/17/2015  Problem List:  Patient Active Problem List   Diagnosis Date Noted  . 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 Paroxysmal a-fib: Remote hx of afib 06/15/2015  . Cerebral infarction due to embolism of left middle cerebral artery   . Essential hypertension   . Stroke-like symptoms 06/13/2015  . HLD (hyperlipidemia) 06/13/2015  . DDD (degenerative disc disease), cervical 06/13/2015  . Elevated serum creatinine 06/13/2015  . Aphasia 06/13/2015  . Expressive aphasia   . Cerebral infarction due to unspecified mechanism   . Type 2 diabetes mellitus without complication   . IBS (irritable bowel syndrome) 04/24/2013  . High cholesterol 04/24/2013  . Diabetes 04/24/2013  . Essential hypertension, benign 04/24/2013   Past Medical History:  Past Medical History  Diagnosis Date  . Hypertension     x 10 yrs  . 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 High cholesterol   . Presence of permanent cardiac pacemaker   . Paroxysmal a-fib: Remote hx of afib 06/15/2015   Past Surgical History:  Past Surgical History  Procedure Laterality Date  . Neck surgery      x 2 for spur and disc problems  . Colonoscopy    . Colonoscopy N/A 05/08/2013    Procedure: COLONOSCOPY;  Surgeon: Malissa Hippo, MD;  [facility];  Service: Endoscopy;  Laterality: N/A;  1015   Social History:  reports that she has never smoked. She does not have any smokeless tobacco history on file. She reports that she does not drink alcohol or use illicit drugs.  Family / Support Systems Marital Status: Married How Long?: 52 yrs Patient Roles: Parent, Spouse, Volunteer Spouse/Significant Other: husband, Darra Rosa @ [phone] or (C) [phone] Children: 3 adult children:  [name] ([place]), French Ana ([place]) and Trudie Buckler [facility]) Other Supports: Husband  has several sibilings in the area and pt has a sister local who are all supportive Anticipated Caregiver: Husband Ability/Limitations of Caregiver: Husband is retired and can assist. Caregiver Availability: 24/7 Family Dynamics: Husband very involved in interveiw, however, as he corrects the information pt provides, she gets visibly frustrated.  Pt indicates that family is very supportive.    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  She smiles easily and attempts to answer my questions as best as she is able, however, her communication has been affected by CVA.  She can get some basic answers out easily but then gets "stuck" on phrases/ numbers.  She is visibly frustrated with husband when he points out incorrect answers. Cannot fully assess her emotional adjustment status due to communication deficits - will monitor and refer to neuropsychology as indicated. Recent Psychosocial Issues: None Pyschiatric History: None Substance Abuse History: None  Patient / Family Perceptions, Expectations & Goals Pt/Family understanding of illness & functional limitations: Difficult to assess how much pt is understanding about her deficits due to CVA.  Husband  easily frustrated with pt's incorrect answers and shakes his head.  At one point, place his head in his hands as pt was stuck on a certain phrase and could not get out correct answer to simple question.   Appears  that ongoing education (especially with ST) will be needed here. Premorbid pt/family roles/activities: Pt very active and involved with family, grandchildren and church.  Both pt and husband are active members of [place] community.   Anticipated changes in roles/activities/participation: Dependent on pt's gains with communication, she may require ongoing assist of husband.  Appears minimal physical deficit. Pt/family expectations/goals: Pt unable to state goals.  Clear from interactions that she and husband are hopeful that her speech/ communication greatly improves.  Community Resources Levi StraussCommunity Agencies: None Premorbid Home Care/DME Agencies: None Transportation available at discharge: yes Resource referrals recommended: Neuropsychology, Support group (specify)  Discharge Planning Living Arrangements: Spouse/significant other Support Systems: Spouse/significant other, Children, Manufacturing engineerriends/neighbors, Other relatives, Church/faith community Type of Residence: Private residence Insurance Resources: Electrical engineerMedicare Financial Resources: Restaurant manager, fast foodocial Security Financial Screen Referred: No Living Expenses: Own Money Management: Spouse Does the patient have any problems obtaining your medications?: No Home Management: pt and spouse Patient/Family Preliminary Plans: Pt will return home with her husband as primary caregiver Social Work Anticipated Follow Up Needs: HH/OP, Support Group Expected length of stay: 10-14 days  Clinical Impression Pleasant woman here following a very recent CVA.  Communication deficits make completion of interview difficult and needed some assistance from her husband (which appeared to frustrate pt as well.)  Good family support and spouse can provide 24/7 assist.  Likely that speech will be biggest frustration for pt and family.  Will monitor mood adjustment through her stay and refer to neuropsych as warranted.  Kota Ciancio 06/17/2015, 12:30 PM

## 2015-06-17 NOTE — Evaluation (Signed)
Occupational Therapy Assessment and Plan  Patient Details  Name: Holly Sparks MRN: [id number] Date of Birth: 07-16-1941  OT Diagnosis: ataxia, muscle weakness (generalized) and decreased coordination Rehab Potential: Rehab Potential (ACUTE ONLY): Good ELOS: 7 days   Today's Date: 06/17/2015 OT Individual Time: 1013 - 30     60 minutes skilled intervention  Problem List:  Patient Active Problem List   Diagnosis Date Noted  . 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 Paroxysmal a-fib: Remote hx of afib 06/15/2015  . Cerebral infarction due to embolism of left middle cerebral artery   . Essential hypertension   . Stroke-like symptoms 06/13/2015  . HLD (hyperlipidemia) 06/13/2015  . DDD (degenerative disc disease), cervical 06/13/2015  . Elevated serum creatinine 06/13/2015  . Aphasia 06/13/2015  . Expressive aphasia   . Cerebral infarction due to unspecified mechanism   . Type 2 diabetes mellitus without complication   . IBS (irritable bowel syndrome) 04/24/2013  . High cholesterol 04/24/2013  . Diabetes 04/24/2013  . Essential hypertension, benign 04/24/2013    Past Medical History:  Past Medical History  Diagnosis Date  . Hypertension     x 10 yrs  . 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 High cholesterol   . Presence of permanent cardiac pacemaker   . Paroxysmal a-fib: Remote hx of afib 06/15/2015   Past Surgical History:  Past Surgical History  Procedure Laterality Date  . Neck surgery      x 2 for spur and disc problems  . Colonoscopy    . Colonoscopy N/A 05/08/2013    Procedure: COLONOSCOPY;  Surgeon: Rogene Houston, MD;  Location: [facility];  Service: Endoscopy;  Laterality: N/A;  1015    Assessment & Plan Clinical Impression: Patient is a 74 y.o. year old female with right handed female with history of hypertension, diabetes mellitus with peripheral neuropathy as well as history of arrhythmia/atrial fibrillation seen by [facility] cardiology in  the past maintained on metoprolol as well as aspirin. Independent prior to admission living with her husband. Presented 06/13/2015 with speech difficulties and inability to follow commands. Cranial CT scan negative. EKG showed T-wave inversions. Troponin negative. Patient did not receive TPA. MRI of the brain showed acute left MCA territory infarcts most confluent involvement in the left basal ganglia. MRA of the head negative. Echocardiogram with ejection fraction of 78% grade 1 diastolic dysfunction. Carotid Dopplers with no ICA stenosis. Neurology services consulted presently maintained on Eliquis CVA prophylaxis. Dysphagia 1 nectar liquids. Physical and occupational therapy evaluations completed 06/14/2015 with recommendations of physical medicine rehabilitation consult. Patient was admitted for comprehensive rehabilitation program.Patient transferred to CIR on 06/16/2015 .    Patient currently requires min with basic self-care skills secondary to muscle weakness, decreased cardiorespiratoy endurance and decreased sitting balance, decreased standing balance, decreased balance strategies and ataxia.  Prior to hospitalization, patient could complete ADLs and IADLs  with independent .  Patient will benefit from skilled intervention to increase independence with basic self-care skills prior to discharge home with care partner.  Anticipate patient will require 24 hour supervision and follow up outpatient.  OT - End of Session Activity Tolerance: Decreased this session Endurance Deficit: Yes Endurance Deficit Description: rest breaks needed throughout session  OT Assessment Rehab Potential (ACUTE ONLY): Good Barriers to Discharge:  (none known) OT Patient demonstrates impairments in the following area(s): Balance;Cognition;Endurance;Motor;Safety OT Basic ADL's Functional Problem(s): Grooming;Bathing;Dressing;Toileting OT Advanced ADL's Functional Problem(s): Simple Meal Preparation;Laundry OT Transfers  Functional Problem(s): Toilet;Tub/Shower OT Additional Impairment(s): None OT Plan OT Intensity: Minimum of 1-2 x/day, 45 to 90 minutes OT Frequency: 5 out of 7 days OT Duration/Estimated Length of Stay: 7 days OT Treatment/Interventions: Medical illustrator training;Community reintegration;Neuromuscular re-education;Patient/family education;Self Care/advanced ADL retraining;Therapeutic Exercise;UE/LE Coordination activities;UE/LE Strength taining/ROM;Therapeutic Activities;Psychosocial support;Cognitive remediation/compensation;Discharge planning;DME/adaptive equipment instruction;Functional mobility training OT Self Feeding Anticipated Outcome(s): n/a OT Basic Self-Care Anticipated Outcome(s): Mod I - supervision OT Toileting Anticipated Outcome(s): mod I  OT Bathroom Transfers Anticipated Outcome(s): Mod I - toilet, supervision - shower OT Recommendation Recommendations for Other Services:  (none) Patient destination: Home Follow Up Recommendations: Outpatient OT;24 hour supervision/assistance Equipment Recommended: To be determined   Skilled Therapeutic Intervention Upon entering the room, pt seated in wheelchair with family present in the room. Pt with no c/o pain this session. OT educated pt and family members on OT purpose, POC, and goals this session with them verbalizing understanding. Pt ambulating with min hand held assist to closet to obtain clothing items for dressing and bathing session. Pt carried items in arm with min A for balance into bathroom and placed them on rail. Pt seated on toilet with min guard and doffed clothing for shower while voiding. Pt  Performing clothing management and hygiene with steady assist. Pt ambulating with hand held assist to sit onto TTB in walk in shower for bathing. Pt requiring min verbal cues for sequencing and initiation of tasks. Steady assist as pt standing in shower to wash buttocks and peri area. Pt returning to sit on toilet for dressing with  steady assist for LB clothing management. Pt returning to sit in wheelchair with family present in room without use of AD and hand held assist. Call bell and all needed items within reach.   OT Evaluation Precautions/Restrictions  Precautions Precautions: Fall Restrictions Weight Bearing Restrictions: No Vital Signs Therapy Vitals Temp: 97.7 F (36.5 C) Temp Source: Oral Pulse Rate: (!) 53 Resp: 18 BP: 126/61 mmHg Patient Position (if appropriate): Lying Oxygen Therapy SpO2: 98 % O2 Device: Not Delivered Pain Pain Assessment Pain Assessment: No/denies pain Home Living/Prior Functioning Home Living Family/patient expects to be discharged to:: Private residence Living Arrangements: Spouse/significant other Available Help at Discharge: Family, Available 24 hours/day Type of Home: House Home Access: Stairs to enter Technical brewer of Steps: 1 Entrance Stairs-Rails: None Home Layout: Two level, Able to live on main level with bedroom/bathroom Alternate Level Stairs-Rails: None Bathroom Shower/Tub: Gaffer, Door, Public librarian, Architectural technologist: Standard  Lives With: Spouse Prior Function Level of Independence: Independent with homemaking with ambulation, Independent with basic ADLs, Independent with gait, Independent with transfers Comments: Driving Vision/Perception  Vision- History Baseline Vision/History: Wears glasses Wears Glasses: Reading only Patient Visual Report: No change from baseline Vision- Assessment Vision Assessment?: Yes Eye Alignment: Within Functional Limits Ocular Range of Motion: Within Functional Limits Tracking/Visual Pursuits: Able to track stimulus in all quads without difficulty Visual Fields: No apparent deficits  Cognition Overall Cognitive Status: Impaired/Different from baseline Arousal/Alertness: Awake/alert Orientation Level: Person;Place;Situation Person: Oriented Place: Oriented Situation: Oriented Year:  2016 Month: July Day of Week: Correct Memory:  (difficult to assess due to aphasia) Immediate Memory Recall: Sock;Blue;Bed Memory Recall: Blue;Bed Memory Recall Blue: Without Cue Memory Recall Bed: Without Cue Sustained Attention: Impaired Sustained Attention Impairment: Verbal complex;Functional complex Awareness: Impaired Awareness Impairment: Intellectual impairment Problem Solving:  (difficult to assess due to aphasia) Safety/Judgment: Impaired Sensation Sensation Light Touch: Appears Intact Stereognosis: Not tested Hot/Cold: Appears Intact Proprioception: Appears Intact Coordination Gross Motor Movements  are Fluid and Coordinated: No Fine Motor Movements are Fluid and Coordinated: Yes Motor  Motor Motor: Motor impersistence;Ataxia;Other (comment) Motor - Skilled Clinical Observations: generalized weakness, ataxia Mobility  Transfers Transfers: Sit to Stand;Stand to Sit Sit to Stand: 4: Min guard Stand to Sit: 4: Min guard  Trunk/Postural Assessment  Cervical Assessment Cervical Assessment: Within Functional Limits Thoracic Assessment Thoracic Assessment: Exceptions to [facility] (kyphotic) Lumbar Assessment Lumbar Assessment: Exceptions to [facility] (posterior pelvic tilt)  Balance Balance Balance Assessed: Yes Standardized Balance Assessment Standardized Balance Assessment: Berg Balance Test Berg Balance Test Sit to Stand: Able to stand  independently using hands Standing Unsupported: Able to stand 2 minutes with supervision Sitting with Back Unsupported but Feet Supported on Floor or Stool: Able to sit safely and securely 2 minutes Stand to Sit: Uses backs of legs against chair to control descent Transfers: Needs one person to assist Standing Unsupported with Eyes Closed: Able to stand 10 seconds with supervision Standing Ubsupported with Feet Together: Needs help to attain position but able to stand for 30 seconds with feet together From Standing, Reach Forward with  Outstretched Arm: Reaches forward but needs supervision From Standing Position, Pick up Object from Floor: Able to pick up shoe, needs supervision From Standing Position, Turn to Look Behind Over each Shoulder: Looks behind one side only/other side shows less weight shift Turn 360 Degrees: Needs close supervision or verbal cueing Standing Unsupported, Alternately Place Feet on Step/Stool: Able to complete >2 steps/needs minimal assist Standing Unsupported, One Foot in Front: Loses balance while stepping or standing Standing on One Leg: Unable to try or needs assist to prevent fall Total Score: 26 Static Sitting Balance Static Sitting - Level of Assistance: 5: Stand by assistance Dynamic Sitting Balance Dynamic Sitting - Level of Assistance: 5: Stand by assistance Sitting balance - Comments: Min assist to maintain balance Static Standing Balance Static Standing - Level of Assistance: 4: Min assist Dynamic Standing Balance Dynamic Standing - Level of Assistance: 4: Min assist Extremity/Trunk Assessment RUE Assessment RUE Assessment: Within Functional Limits (4/5 throughout but grip strength is 3+/5) LUE Assessment LUE Assessment: Within Functional Limits  FIM:  FIM - Eating Eating Activity: 5: Supervision/cues FIM - Grooming Grooming Steps: Wash, rinse, dry face;Wash, rinse, dry hands;Brush, comb hair;Oral care, brush teeth, clean dentures Grooming: 5: Supervision: safety issues or verbal cues FIM - Bathing Bathing Steps Patient Completed: Chest;Right Arm;Left Arm;Buttocks;Front perineal area;Abdomen;Right upper leg;Left lower leg (including foot);Left upper leg;Right lower leg (including foot) Bathing: 4: Steadying assist FIM - Upper Body Dressing/Undressing Upper body dressing/undressing steps patient completed: Thread/unthread right bra strap;Thread/unthread left bra strap;Hook/unhook bra;Thread/unthread right sleeve of pullover shirt/dresss;Thread/unthread left sleeve of pullover  shirt/dress;Put head through opening of pull over shirt/dress;Pull shirt over trunk Upper body dressing/undressing: 5: Supervision: Safety issues/verbal cues FIM - Lower Body Dressing/Undressing Lower body dressing/undressing steps patient completed: Thread/unthread right underwear leg;Thread/unthread left underwear leg;Pull underwear up/down;Thread/unthread right pants leg;Thread/unthread left pants leg;Pull pants up/down;Fasten/unfasten pants;Don/Doff right sock;Don/Doff left sock;Don/Doff left shoe;Don/Doff right shoe;Fasten/unfasten right shoe;Fasten/unfasten left shoe Lower body dressing/undressing: 4: Steadying Assist FIM - Toileting Toileting steps completed by patient: Adjust clothing prior to toileting;Performs perineal hygiene;Adjust clothing after toileting Toileting: 4: Steadying assist FIM - Control and instrumentation engineer Devices: Arm rests Bed/Chair Transfer: 5: Supine > Sit: Supervision (verbal cues/safety issues);5: Sit > Supine: Supervision (verbal cues/safety issues);4: Bed > Chair or W/C: Min A (steadying Pt. > 75%);4: Chair or W/C > Bed: Min A (steadying Pt. > 75%) FIM - Toilet Transfers  Chief of Staff Devices: Product manager Transfers: 4-From toilet/BSC: Min A (steadying Pt. > 75%);4-To toilet/BSC: Min A (steadying Pt. > 75%) FIM - Tub/Shower Transfers Tub/Shower Assistive Devices: Tub transfer bench;Walk in shower;Grab bars Tub/shower Transfers: 4-Into Tub/Shower: Min A (steadying Pt. > 75%/lift 1 leg);4-Out of Tub/Shower: Min A (steadying Pt. > 75%/lift 1 leg)   Refer to Care Plan for Long Term Goals  Recommendations for other services: None  Discharge Criteria: Patient will be discharged from OT if patient refuses treatment 3 consecutive times without medical reason, if treatment goals not met, if there is a change in medical status, if patient makes no progress towards goals or if patient is discharged from hospital.  The above  assessment, treatment plan, treatment alternatives and goals were discussed and mutually agreed upon: by patient and by family  Phineas Semen 06/17/2015, 5:00 PM

## 2015-06-17 NOTE — Progress Notes (Signed)
Speech Language Pathology Daily Session Note  Patient Details  Name: Holly BoschCatherine P Grieshaber MRN: [id number] Date of Birth: 08-10-1941  Today's Date: 06/17/2015 SLP Individual Time: 1400-1430 SLP Individual Time Calculation (min): 30 min  Short Term Goals: Week 1: SLP Short Term Goal 1 (Week 1): Patient will sustain attention to functional and familiar tasks for 10 minutes with min A verbal cues for redirection.  SLP Short Term Goal 2 (Week 1): Patient will self-monitor verbal errors with Mod A question and verbal cues.  SLP Short Term Goal 3 (Week 1): Patient will follow 2 step commands with 75% accuracy and Mod A multimodal cues.  SLP Short Term Goal 4 (Week 1): Patient will name functional items with 75% accuracy with Min A multimodal cues.  SLP Short Term Goal 5 (Week 1): Patient will match a written word to an item from a field of 2 with 75% accuracy and Min A multimodal cues.  SLP Short Term Goal 6 (Week 1): Patient will consume trials of thin liquids without overt s/s of aspiration with supervision cues over 3 conescutive sessions to assess readiness for repeat MBS.   Skilled Therapeutic Interventions: Skilled treatment session focused on patient/family education. SLP facilitated session by providing education to the patient, her husband and her sister in regards to her current cognitive-linguistic function and strategies to utilize to maximize verbal expression, functional communication and to decrease frustration. Patient with increased difficulty with verbal expression throughout conversation, suspect due to fatigue. All verbalized understanding of information and asked appropriate questions.  Patient left upright in wheelchair with family present. Continue with current plan of care.    FIM:  Comprehension Comprehension Mode: Auditory Comprehension: 3-Understands basic 50 - 74% of the time/requires cueing 25 - 50%  of the time Expression Expression: 3-Expresses basic 50 - 74% of the  time/requires cueing 25 - 50% of the time. Needs to repeat parts of sentences. Social Interaction Social Interaction: 5-Interacts appropriately 90% of the time - Needs monitoring or encouragement for participation or interaction. Problem Solving Problem Solving: 4-Solves basic 75 - 89% of the time/requires cueing 10 - 24% of the time Memory Memory: 4-Recognizes or recalls 75 - 89% of the time/requires cueing 10 - 24% of the time FIM - Eating Eating Activity: 5: Supervision/cues  Pain Unable to rate, reported a headache, RN made aware.   Therapy/Group: Individual Therapy  Ralene Gasparyan 06/17/2015, 4:20 PM

## 2015-06-18 ENCOUNTER — Inpatient Hospital Stay (HOSPITAL_COMMUNITY): Payer: Medicare Other | Admitting: Physical Therapy

## 2015-06-18 ENCOUNTER — Inpatient Hospital Stay (HOSPITAL_COMMUNITY): Payer: Medicare Other | Admitting: Speech Pathology

## 2015-06-18 ENCOUNTER — Inpatient Hospital Stay (HOSPITAL_COMMUNITY): Payer: Medicare Other | Admitting: Occupational Therapy

## 2015-06-18 DIAGNOSIS — I4891 Unspecified atrial fibrillation: Secondary | ICD-10-CM

## 2015-06-18 DIAGNOSIS — I6340 Cerebral infarction due to embolism of unspecified cerebral artery: Secondary | ICD-10-CM

## 2015-06-18 LAB — GLUCOSE, CAPILLARY
GLUCOSE-CAPILLARY: 210 mg/dL — AB (ref 65–99)
Glucose-Capillary: 144 mg/dL — ABNORMAL HIGH (ref 65–99)
Glucose-Capillary: 185 mg/dL — ABNORMAL HIGH (ref 65–99)
Glucose-Capillary: 221 mg/dL — ABNORMAL HIGH (ref 65–99)

## 2015-06-18 MED ORDER — INSULIN GLARGINE 100 UNIT/ML ~~LOC~~ SOLN
12.0000 [IU] | Freq: Every day | SUBCUTANEOUS | Status: DC
  Administered 2015-06-18 – 2015-06-19 (×2): 12 [IU] via SUBCUTANEOUS
  Filled 2015-06-18 (×3): qty 0.12

## 2015-06-18 NOTE — Progress Notes (Signed)
[facility] PHYSICAL MEDICINE & REHABILITATION     PROGRESS NOTE    Subjective/Complaints: Had a busy day. "faded" at end of therapy day. Didn't sleep all that well last night. Denies pain.   ROS: Pt denies fever, rash/itching, headache, blurred or double vision, nausea, vomiting, abdominal pain, diarrhea, chest pain, shortness of breath, palpitations, dysuria, dizziness, neck or back pain, bleeding, anxiety, or depression   Objective: Vital Signs: Blood pressure 126/66, pulse 52, temperature 97.6 F (36.4 C), temperature source Oral, resp. rate 16, height 5\' 6"  (1.676 m), weight 72.3 kg (159 lb 6.3 oz), SpO2 100 %. No results found.  Recent Labs  06/16/15 0505 06/17/15 0515  WBC 4.9 5.8  HGB 13.7 14.2  HCT 39.9 41.7  PLT 139* 138*    Recent Labs  06/16/15 0505 06/17/15 0515  NA 139 136  K 4.1 3.3*  CL 104 101  GLUCOSE 96 113*  BUN 8 11  CREATININE 1.01* 1.01*  CALCIUM 9.1 9.0   CBG (last 3)   Recent Labs  06/17/15 2005 06/17/15 2059 06/18/15 0648  GLUCAP 170* 160* 144*    Wt Readings from Last 3 Encounters:  06/17/15 72.3 kg (159 lb 6.3 oz)  06/13/15 71.215 kg (157 lb)  10/13/14 71.215 kg (157 lb)    Physical Exam:  Constitutional: She appears well-developed. no distress HENT: oral mucosa pink and moist. Dentition good Head: Normocephalic.  Eyes: EOM are normal.  Neck: Normal range of motion. Neck supple. No thyromegaly present.  Cardiovascular:  Cardiac rate controlled. Regular rhythm. No murmurs Respiratory: Effort normal and breath sounds normal. No respiratory distress. no rales and wheezes GI: Soft. Bowel sounds are normal. She exhibits no distension.  Neurological: She is alert.   Improved word finding abilities/apraxia. Good sitting balance RUE: 4- delt, bicep, tricep, wrist and hi. LUE: 5/5 prox to distal. Right pronator drift. RLE: 4-/5 hf, 4 ke and 4/5 ankle. LLE 4+/5 prox to 5/5 distal. Senses pain and LT in all 4's. Mild right  central 7 and tongue deviation. DTR s 1+.  Skin: Skin is warm and dry.  Psychiatric: She has a normal mood and affect. Her behavior is normal   Assessment/Plan: 1. Functional deficits secondary to left MCA infarct which require 3+ hours per day of interdisciplinary therapy in a comprehensive inpatient rehab setting. Physiatrist is providing close team supervision and 24 hour management of active medical problems listed below. Physiatrist and rehab team continue to assess barriers to discharge/monitor patient progress toward functional and medical goals. FIM: FIM - Bathing Bathing Steps Patient Completed: Chest, Right Arm, Left Arm, Buttocks, Front perineal area, Abdomen, Right upper leg, Left lower leg (including foot), Left upper leg, Right lower leg (including foot) Bathing: 4: Steadying assist  FIM - Upper Body Dressing/Undressing Upper body dressing/undressing steps patient completed: Thread/unthread right bra strap, Thread/unthread left bra strap, Hook/unhook bra, Thread/unthread right sleeve of pullover shirt/dresss, Thread/unthread left sleeve of pullover shirt/dress, Put head through opening of pull over shirt/dress, Pull shirt over trunk Upper body dressing/undressing: 5: Supervision: Safety issues/verbal cues FIM - Lower Body Dressing/Undressing Lower body dressing/undressing steps patient completed: Thread/unthread right underwear leg, Thread/unthread left underwear leg, Pull underwear up/down, Thread/unthread right pants leg, Thread/unthread left pants leg, Pull pants up/down, Fasten/unfasten pants, Don/Doff right sock, Don/Doff left sock, Don/Doff left shoe, Don/Doff right shoe, Fasten/unfasten right shoe, Fasten/unfasten left shoe Lower body dressing/undressing: 4: Steadying Assist  FIM - Toileting Toileting steps completed by patient: Adjust clothing prior to toileting, Performs perineal hygiene,  Adjust clothing after toileting Toileting: 4: Steadying assist  FIM - Ambulance person Devices: Grab bars Toilet Transfers: 4-From toilet/BSC: Min A (steadying Pt. > 75%), 4-To toilet/BSC: Min A (steadying Pt. > 75%)  FIM - Bed/Chair Transfer Bed/Chair Transfer Assistive Devices: Arm rests Bed/Chair Transfer: 5: Supine > Sit: Supervision (verbal cues/safety issues), 5: Sit > Supine: Supervision (verbal cues/safety issues), 4: Bed > Chair or W/C: Min A (steadying Pt. > 75%), 4: Chair or W/C > Bed: Min A (steadying Pt. > 75%)  FIM - Locomotion: Wheelchair Locomotion: Wheelchair: 1: Total Assistance/staff pushes wheelchair (Pt<25%) FIM - Locomotion: Ambulation Locomotion: Ambulation Assistive Devices: Other (comment) (HHA) Ambulation/Gait Assistance: 4: Min assist Locomotion: Ambulation: 4: Travels 150 ft or more with minimal assistance (Pt.>75%)  Comprehension Comprehension Mode: Auditory Comprehension: 3-Understands basic 50 - 74% of the time/requires cueing 25 - 50%  of the time  Expression Expression: 3-Expresses basic 50 - 74% of the time/requires cueing 25 - 50% of the time. Needs to repeat parts of sentences.  Social Interaction Social Interaction: 5-Interacts appropriately 90% of the time - Needs monitoring or encouragement for participation or interaction.  Problem Solving Problem Solving: 4-Solves basic 75 - 89% of the time/requires cueing 10 - 24% of the time  Memory Memory: 4-Recognizes or recalls 75 - 89% of the time/requires cueing 10 - 24% of the time  Medical Problem List and Plan: 1. Functional deficits secondary to cardioembolic left MCA infarct--continue therapies 2. DVT Prophylaxis/Anticoagulation: Eliquis. No bleeding sequelae 3. Pain Management: Ultram as needed for breakthrough pain--states pain is under control 4. Mood/anxiety: Ativan 0.5 mg twice daily as needed. Provide reassurance and + reinforcement 5. Neuropsych: This patient is capable of making decisions on her own behalf. 6. Skin/Wound Care: Routine  skin checks. Oob.  7. Fluids/Electrolytes/Nutrition:continue potassium supplement. 8. Dysphagia. Dysphagia #3 nectar thick liquids. Continue for now 9. Hypertension/atrial fibrillation. Lopressor 12.5 mg daily for bp and rate controlled -bp with some improvement-continue current doses of lopressor, lisinopril 20 mg daily, hydrochlorothiazide 25 mg daily 10. Diabetes mellitus of peripheral neuropathy. Hemoglobin A1c 11.8. Lantus insulin 10 units daily at bedtime. Check blood sugars before meals and at bedtime.  -will adjust lantus to 12 u qam  LOS (Days) 2 A FACE TO FACE EVALUATION WAS PERFORMED  Aron Inge T 06/18/2015 9:21 AM

## 2015-06-18 NOTE — Plan of Care (Signed)
Problem: RH BOWEL ELIMINATION Goal: RH STG MANAGE BOWEL WITH ASSISTANCE STG Manage Bowel with min Assistance.  Outcome: Not Progressing Pt required sorbitol to have a BM today after no BM for 3 days

## 2015-06-18 NOTE — IPOC Note (Signed)
Overall Plan of Care [facility]) Patient Details Name: Holly Sparks MRN: [id number] DOB: 1941/09/08  Admitting Diagnosis: L MCA infarct  Hospital Problems: Principal Problem:   Paroxysmal a-fib: Remote hx of afib Active Problems:   Aphasia   Cerebral infarction due to embolism of left middle cerebral artery   Essential hypertension     Functional Problem List: Nursing Behavior, Bladder, Bowel, Endurance, Medication Management, Motor, Nutrition, Perception, Safety, Pain, Sensory, Skin Integrity  PT Balance, Endurance, Motor, Safety  OT Balance, Cognition, Endurance, Motor, Safety  SLP Cognition, Linguistic  TR         Basic ADL's: OT Grooming, Bathing, Dressing, Toileting     Advanced  ADL's: OT Simple Meal Preparation, Laundry     Transfers: PT Bed to Chair, Bed Mobility, Car, Occupational psychologist, Research scientist (life sciences): PT Ambulation, Stairs     Additional Impairments: OT None  SLP Swallowing, Communication, Social Cognition comprehension, expression Attention, Awareness  TR      Anticipated Outcomes Item Anticipated Outcome  Self Feeding n/a  Swallowing  Supervision with least restrictive diet    Basic self-care  Mod I - supervision  Toileting  mod I    Bathroom Transfers Mod I - toilet, supervision - shower  Bowel/Bladder  Remain continent of bowel and bladder   Transfers  mod I to S level  Locomotion  S gait  Communication  Min A  Cognition  Supervision   Pain  Pain less than 3 on a scale of 0-10  Safety/Judgment  Pt will remain free of falls and injury during stay on rehab    Therapy Plan: PT Intensity: Minimum of 1-2 x/day ,45 to 90 minutes PT Frequency: 5 out of 7 days PT Duration Estimated Length of Stay: ~ 7 days OT Intensity: Minimum of 1-2 x/day, 45 to 90 minutes OT Frequency: 5 out of 7 days OT Duration/Estimated Length of Stay: 7 days SLP Intensity: Minumum of 1-2 x/day, 30 to 90 minutes SLP Frequency: 3 to 5 out of 7  days SLP Duration/Estimated Length of Stay: 7-10 days        Team Interventions: Nursing Interventions Patient/Family Education, Bladder Management, Bowel Management, Disease Management/Prevention, Pain Management, Medication Management, Skin Care/Wound Management, Cognitive Remediation/Compensation, Dysphagia/Aspiration Precaution Training, Psychosocial Support, Discharge Planning  PT interventions Ambulation/gait training, Warden/ranger, Cognitive remediation/compensation, Community reintegration, Discharge planning, Disease management/prevention, DME/adaptive equipment instruction, Functional mobility training, Neuromuscular re-education, Pain management, Patient/family education, Psychosocial support, Splinting/orthotics, Stair training, Therapeutic Activities, Therapeutic Exercise, UE/LE Strength taining/ROM, UE/LE Coordination activities, Wheelchair propulsion/positioning  OT Interventions Warden/ranger, Firefighter, Neuromuscular re-education, Patient/family education, Self Care/advanced ADL retraining, Therapeutic Exercise, UE/LE Coordination activities, UE/LE Strength taining/ROM, Therapeutic Activities, Psychosocial support, Cognitive remediation/compensation, Discharge planning, DME/adaptive equipment instruction, Functional mobility training  SLP Interventions Cognitive remediation/compensation, Cueing hierarchy, Dysphagia/aspiration precaution training, Environmental controls, Functional tasks, Internal/external aids, Patient/family education, Speech/Language facilitation, Therapeutic Activities  TR Interventions    SW/CM Interventions Discharge Planning, Psychosocial Support, Patient/Family Education    Team Discharge Planning: Destination: PT-Home ,OT- Home , SLP-Home Projected Follow-up: PT-Outpatient PT, 24 hour supervision/assistance, OT-  Outpatient OT, 24 hour supervision/assistance, SLP-Outpatient SLP, 24 hour  supervision/assistance Projected Equipment Needs: PT-To be determined (AD for gait (RW v [employer])), OT- To be determined, SLP-None recommended by SLP Equipment Details: PT- , OT-  Patient/family involved in discharge planning: PT- Patient,  OT-Patient, Family member/caregiver, SLP-Patient, Family member/caregiver  MD ELOS: 7-10 days Medical Rehab Prognosis:  Excellent Assessment: The patient has been admitted for  CIR therapies with the diagnosis of CVA. The team will be addressing functional mobility, strength, stamina, balance, safety, adaptive techniques and equipment, self-care, bowel and bladder mgt, patient and caregiver education, NMR, visual-spatial awareness, communication, language, stroke ed. Goals have been set at mod I/supervision for mobilty and self-care and supervision to min assist for communication.    Ranelle Oyster, MD, FAAPMR      See Team Conference Notes for weekly updates to the plan of care

## 2015-06-18 NOTE — Progress Notes (Addendum)
Physical Therapy Session Note  Patient Details  Name: Holly Sparks MRN: [id number] Date of Birth: [birth year]  Today's Date: 06/18/2015 PT Individual Time: 0815-0900 PT Individual Time Calculation (min): 45 min   Short Term Goals: Week 1:  PT Short Term Goal 1 (Week 1): = LTGs  Skilled Therapeutic Interventions/Progress Updates:    Pt received seated in chair with husband present; c/o rib pain as described below, pt unable to rate pain 0-10. RN made aware and vitals taken BP 104/61. Performed upper and lower body dressing with supervision, including sit<>stand and standing balance while donning pants. Gait training for 2 trials for approximately 200' each with HHA. Occasional mild LOB which pt is able to recover with minA from therapist. Stair training for 1 trial of 12 6-inch stairs with minA/CGA and no LOB; pt very fatigued after trial and requires seated rest break. Nustep performed on level 2 with average 37 steps/min x34min; performed to improve aerobic endurance for carryover into gait for functional distances. Gait to return to room approximately 200' with HHA and inc gait speed and dec instability compared to previous trials. Pt requests to use restroom; ambulated into restroom with HHA and assisted to sitting on toilet. Pt left with NA present while seated on toilet at completion of session.   Therapy Documentation Precautions:  Precautions Precautions: Fall Restrictions Weight Bearing Restrictions: No Pain: Pain Assessment Pain Assessment: Faces Faces Pain Scale: Hurts a little bit Pain Location: Rib cage Pain Orientation: Lower Pain Descriptors / Indicators: Sore;Aching Pain Onset: On-going Patients Stated Pain Goal: 0 Pain Intervention(s): RN made aware Multiple Pain Sites: No Locomotion : Ambulation Ambulation/Gait Assistance: 4: Min assist   See FIM for current functional status  Therapy/Group: Individual Therapy  Vista Lawman 06/18/2015, 9:09 AM

## 2015-06-18 NOTE — Progress Notes (Signed)
Occupational Therapy Session Note  Patient Details  Name: Holly Sparks MRN: [id number] Date of Birth: 11/28/1941  Today's Date: 06/18/2015 OT Individual Time: 1000-1056 OT Individual Time Calculation (min): 56 min    Short Term Goals: Week 1:  OT Short Term Goal 1 (Week 1): STGs= LTGs secondary to short LOS  Skilled Therapeutic Interventions/Progress Updates:  Upon entering the room, pt seated in wheelchair awaiting therapist arrival with no c/o pain this session. Pt declined shower but requested to change clothing items. Pt ambulated with close supervision - min guard to closet to obtain clothing items. Pt then carried items into bathroom with hand held assistance. Pt seated on standard toilet for toileting with supervision for clothing management and hygiene.  Pt removing all clothing while seated on toilet and dressing from there. Sit <>stand with supervision and min A for balance during LB clothing management. After donning clothing, pt ambulated 100' to day room with hand held assist. She sat on EOB and performed 2 sets of 10 sit <>stand without use of B UEs. Pt often having posterior lean during tasks and requiring min verbal cues and facilitation for forward weight shift. Pt ambulated without device and min A back to room and seated in wheelchair with husband present. Call bell and all needed items within reach upon exiting the room.   Therapy Documentation Precautions:  Precautions Precautions: Fall Restrictions Weight Bearing Restrictions: No Vital Signs: Therapy Vitals Pulse Rate: 60 BP: 125/70 mmHg Pain: Pain Assessment Pain Assessment: Faces Faces Pain Scale: Hurts a little bit Pain Location: Rib cage Pain Orientation: Lower Pain Descriptors / Indicators: Sore;Aching Pain Onset: On-going Patients Stated Pain Goal: 0 Pain Intervention(s): RN made aware Multiple Pain Sites: No  See FIM for current functional status  Therapy/Group: Individual Therapy  Lowella Grip 06/18/2015, 11:01 AM

## 2015-06-18 NOTE — Progress Notes (Signed)
Speech Language Pathology Daily Session Notes   Patient Details  Name: Holly Sparks MRN: [id number] Date of Birth: [birth year]  Today's Date: 06/18/2015  Session 1: SLP Individual Time: 0900-1000 SLP Individual Time Calculation (min): 60 min   Session 2: SLP Individual Time: 1300-1330 SLP Individual Time Calculation (min): 30 min   Short Term Goals: Week 1: SLP Short Term Goal 1 (Week 1): Patient will sustain attention to functional and familiar tasks for 10 minutes with min A verbal cues for redirection.  SLP Short Term Goal 2 (Week 1): Patient will self-monitor verbal errors with Mod A question and verbal cues.  SLP Short Term Goal 3 (Week 1): Patient will follow 2 step commands with 75% accuracy and Mod A multimodal cues.  SLP Short Term Goal 4 (Week 1): Patient will name functional items with 75% accuracy with Min A multimodal cues.  SLP Short Term Goal 5 (Week 1): Patient will match a written word to an item from a field of 2 with 75% accuracy and Min A multimodal cues.  SLP Short Term Goal 6 (Week 1): Patient will consume trials of thin liquids without overt s/s of aspiration with supervision cues over 3 conescutive sessions to assess readiness for repeat MBS.   Skilled Therapeutic Interventions:  Session 1: Skilled treatment session focused on cognitive-linguistic goals. SLP facilitated session by providing supervision verbal cues for functional problem solving during a 4 step picture sequencing task. Patient also participated in a verbal description task at the phrase level with Min A semantic and phonemic cues. Patient participated in an informal conversation in regards to basic biographical information with Mod A multimodal cues. Overall, patient demonstrated increased verbal expressio and word-finding this session. Patient left upright in wheelchair with all needs within reach. Continue with current plan of care.    Session 2: Skilled treatment session focused on  cognitive-linguistic goals. Patient named items in pictures with 100% accuracy and Mod A phonemic and semantic cues and was able to identify the similarity between the two items with 100% accuracy and Mod A question and phonemic cues. Patient also generated another item that could belong in the category with Mod-Max semantic cues. Overall, patient demonstrated increased ability to self-monitor errors throughout the session. Patient left supine in bed with alarm on and all needs within reach. Continue with current plan of care.  FIM:  Comprehension Comprehension Mode: Auditory Comprehension: 4-Understands basic 75 - 89% of the time/requires cueing 10 - 24% of the time Expression Expression Mode: Verbal Expression: 3-Expresses basic 50 - 74% of the time/requires cueing 25 - 50% of the time. Needs to repeat parts of sentences. Social Interaction Social Interaction: 5-Interacts appropriately 90% of the time - Needs monitoring or encouragement for participation or interaction. Problem Solving Problem Solving: 4-Solves basic 75 - 89% of the time/requires cueing 10 - 24% of the time Memory Memory: 4-Recognizes or recalls 75 - 89% of the time/requires cueing 10 - 24% of the time  Pain No/Denies Pain   Therapy/Group: Individual Therapy  Kelyn Ponciano 06/18/2015, 3:50 PM

## 2015-06-19 ENCOUNTER — Inpatient Hospital Stay (HOSPITAL_COMMUNITY): Payer: Medicare Other | Admitting: Occupational Therapy

## 2015-06-19 ENCOUNTER — Inpatient Hospital Stay (HOSPITAL_COMMUNITY): Payer: Medicare Other | Admitting: Speech Pathology

## 2015-06-19 ENCOUNTER — Inpatient Hospital Stay (HOSPITAL_COMMUNITY): Payer: Medicare Other

## 2015-06-19 DIAGNOSIS — E119 Type 2 diabetes mellitus without complications: Secondary | ICD-10-CM

## 2015-06-19 LAB — GLUCOSE, CAPILLARY
GLUCOSE-CAPILLARY: 136 mg/dL — AB (ref 65–99)
GLUCOSE-CAPILLARY: 182 mg/dL — AB (ref 65–99)
GLUCOSE-CAPILLARY: 142 mg/dL — AB (ref 65–99)
Glucose-Capillary: 183 mg/dL — ABNORMAL HIGH (ref 65–99)

## 2015-06-19 NOTE — Progress Notes (Signed)
Speech Language Pathology Daily Session Note  Patient Details  Name: Holly Sparks MRN: [id number] Date of Birth: [birth year]  Today's Date: 06/19/2015 SLP Individual Time: 1000-1100 SLP Individual Time Calculation (min): 60 min  Short Term Goals: Week 1: SLP Short Term Goal 1 (Week 1): Patient will sustain attention to functional and familiar tasks for 10 minutes with min A verbal cues for redirection.  SLP Short Term Goal 2 (Week 1): Patient will self-monitor verbal errors with Mod A question and verbal cues.  SLP Short Term Goal 3 (Week 1): Patient will follow 2 step commands with 75% accuracy and Mod A multimodal cues.  SLP Short Term Goal 4 (Week 1): Patient will name functional items with 75% accuracy with Min A multimodal cues.  SLP Short Term Goal 5 (Week 1): Patient will match a written word to an item from a field of 2 with 75% accuracy and Min A multimodal cues.  SLP Short Term Goal 6 (Week 1): Patient will consume trials of thin liquids without overt s/s of aspiration with supervision cues over 3 conescutive sessions to assess readiness for repeat MBS.   Skilled Therapeutic Interventions: Skilled treatment session focused on dysphagia and cognitive-linguistic goals. SLP facilitated session by providing trials of thin liquids via cup. Patient consumed liquids with and an intermittent suspected delayed swallow initiation and cough X 1. Recommend patient continue nectar-thick liquids.  Patient also participated in reading comprehension tasks and matched a written word to an item from a field of 3 with 100% accuracy and written phrase providing a semantic cue from a field of 3 with 100% accuracy and Min A multimodal cues for encoding. Patient recalled events from previous therapy session with Mod I and participated in a functional conversation with extra time and Min A multimodal cues.  Patient left in chair with all needs within reach. Continue with current plan of care.    FIM:   Comprehension Comprehension Mode: Auditory Comprehension: 4-Understands basic 75 - 89% of the time/requires cueing 10 - 24% of the time Expression Expression Mode: Verbal Expression: 4-Expresses basic 75 - 89% of the time/requires cueing 10 - 24% of the time. Needs helper to occlude trach/needs to repeat words. Social Interaction Social Interaction: 5-Interacts appropriately 90% of the time - Needs monitoring or encouragement for participation or interaction. Problem Solving Problem Solving: 4-Solves basic 75 - 89% of the time/requires cueing 10 - 24% of the time Memory Memory: 5-Recognizes or recalls 90% of the time/requires cueing < 10% of the time FIM - Eating Eating Activity: 5: Supervision/cues  Pain Pain Assessment Pain Assessment: No/denies pain  Therapy/Group: Individual Therapy  Raymir Frommelt 06/19/2015, 11:17 AM

## 2015-06-19 NOTE — Progress Notes (Signed)
Occupational Therapy Session Note  Patient Details  Name: Holly Sparks MRN: [id number] Date of Birth: 1941-09-27  Today's Date: 06/19/2015 OT Individual Time: 1100-1130 OT Individual Time Calculation (min): 30 min    Short Term Goals: Week 1:  OT Short Term Goal 1 (Week 1): STGs= LTGs secondary to short LOS  Skilled Therapeutic Interventions/Progress Updates:    Engaged in therapeutic activity with focus on activity tolerance and standing balance.  Pt ambulated to Dayroom with supervision and no AD with close supervision.  Completed jigsaw puzzle in standing at high-low table with pt requiring min-mod cues for sequencing and problem solving.  Pt able to stand for approx 6 mins before requiring seated rest break.  Returned to standing and completed activity in standing, then returned to room as stated above.  Therapy Documentation Precautions:  Precautions Precautions: Fall Restrictions Weight Bearing Restrictions: No General:   Vital Signs: Therapy Vitals Pulse Rate: 65 BP: (!) 159/77 mmHg Pain: Pain Assessment Pain Assessment: No/denies pain  See FIM for current functional status  Therapy/Group: Individual Therapy  Rosalio Loud 06/19/2015, 12:09 PM

## 2015-06-19 NOTE — Progress Notes (Signed)
Physical Therapy Session Note  Patient Details  Name: EMERA BUSSIE MRN: [id number] Date of Birth: 01-09-1941  Today's Date: 06/19/2015 PT Individual Time: 1430-1530 PT Individual Time Calculation (min): 60 min   Short Term Goals: Week 1:  PT Short Term Goal 1 (Week 1): = LTGs  Skilled Therapeutic Interventions/Progress Updates:   Session focused on discussing community re-entry outing planned for next week in regards to goals and purpose related to d/c, gait training > 200' without AD and introduced trial with SPC and WBQC to increase safety with gait with focus on technique and fluidity of movement (decreased arm swing, rigid trunk, decreased step length and narrow BOS) with close S/steady A, and dual task training to address dynamic standing balance on compliant surface and cognitive and speech impairments using colored bean bags which pt had to verbally identify and throw at matching color and then verbalize the similarities each group had (food groups). Pt required min verbal cues to complete tasks. End of session left seated EOB with family in room and all needs in reach. Pt verbalize need to use call bell when wanting to get up.   Therapy Documentation Precautions:  Precautions Precautions: Fall Restrictions Weight Bearing Restrictions: No  Pain:  Denies pain.  See FIM for current functional status  Therapy/Group: Individual Therapy  Karolee Stamps Darrol Poke, PT, DPT  06/19/2015, 3:36 PM

## 2015-06-19 NOTE — Care Management Note (Signed)
[facility] Individual Statement of Services  Patient Name:  Holly Sparks  Date:  06/19/2015  Welcome to the [facility].  Our goal is to provide you with an individualized program based on your diagnosis and situation, designed to meet your specific needs.  With this comprehensive rehabilitation program, you will be expected to participate in at least 3 hours of rehabilitation therapies Monday-Friday, with modified therapy programming on the weekends.  Your rehabilitation program will include the following services:  Physical Therapy (PT), Occupational Therapy (OT), Speech Therapy (ST), 24 hour per day rehabilitation nursing, Therapeutic Recreaction (TR), Neuropsychology, Case Management (Social Worker), Rehabilitation Medicine, Nutrition Services and Pharmacy Services  Weekly team conferences will be held on Tuesdays to discuss your progress.  Your Social Worker will talk with you frequently to get your input and to update you on team discussions.  Team conferences with you and your family in attendance may also be held.  Expected length of stay: 7 days  Overall anticipated outcome: supervision  Depending on your progress and recovery, your program may change. Your Social Worker will coordinate services and will keep you informed of any changes. Your Social Worker's name and contact numbers are listed  below.  The following services may also be recommended but are not provided by the [facility]:   Driving Evaluations  Home Health Rehabiltiation Services  Outpatient Rehabilitation Services    Arrangements will be made to provide these services after discharge if needed.  Arrangements include referral to agencies that provide these services.  Your insurance has been verified to be:  [facility] Medicare Your primary doctor is:  Dr. [name]  Pertinent information will be shared with your doctor and your insurance  company.  Social Worker:  [place], [place] [phone] or ([phone]   Information discussed with and copy given to patient by: Amada Jupiter, 06/19/2015, 3:42 PM

## 2015-06-19 NOTE — Progress Notes (Signed)
Occupational Therapy Session Note  Patient Details  Name: Holly Sparks MRN: [id number] Date of Birth: 1941/03/17  Today's Date: 06/19/2015 OT Individual Time:  -   0830-0930  (60 min)      Short Term Goals: Week 1:  OT Short Term Goal 1 (Week 1): STGs= LTGs secondary to short LOS  Skilled Therapeutic Interventions/Progress Updates:    Pt. Lying in bed upon OT arrival.  Prepared flat bed and no rail and pt went from lying to sitting with Independent l Pt ambulated with close supervision- min guard to bathroom.  Pt carried items into bathroom with hand held assistance. Pt seated on standard toilet for toileting with supervision for clothing management and hygiene. Pt removing all clothing while seated on toilet.  She ambulated  to shower and completed shower in sitting and SBA with standing.  Pt. completed dressing in sitting.  Provided cues for balance strategies while standing.   Sit <>stand with supervision and min A for balance during LB clothing management. After donning clothing, pt ambulated to chair and performed grrooming with mild tremor, but was able to perform.  . Call bell and all needed left in reach.    Therapy Documentation Precautions:  Precautions Precautions: Fall Restrictions Weight Bearing Restrictions: No      Pain:  none          See FIM for current functional status  Therapy/Group: Individual Therapy  Humberto Seals 06/19/2015, 7:43 AM

## 2015-06-19 NOTE — Progress Notes (Signed)
[place] [facility]ICINE & REHABILITATION     PROGRESS NOTE    Subjective/Complaints: Slept better. "i'm ready to get my clothes on". Anxious to get started with OT this morning.    ROS: Pt denies fever, rash/itching, headache, blurred or double vision, nausea, vomiting, abdominal pain, diarrhea, chest pain, shortness of breath, palpitations, dysuria, dizziness, neck or back pain, bleeding, anxiety, or depression   Objective: Vital Signs: Blood pressure 168/75, pulse 54, temperature 98 F (36.7 C), temperature source Oral, resp. rate 16, height 5\' 6"  (1.676 m), weight 72.3 kg (159 lb 6.3 oz), SpO2 98 %. No results found.  Recent Labs  06/17/15 0515  WBC 5.8  HGB 14.2  HCT 41.7  PLT 138*    Recent Labs  06/17/15 0515  NA 136  K 3.3*  CL 101  GLUCOSE 113*  BUN 11  CREATININE 1.01*  CALCIUM 9.0   CBG (last 3)   Recent Labs  06/18/15 1639 06/18/15 2113 06/19/15 0656  GLUCAP 210* 221* 136*    Wt Readings from Last 3 Encounters:  06/17/15 72.3 kg (159 lb 6.3 oz)  06/13/15 71.215 kg (157 lb)  10/13/14 71.215 kg (157 lb)    Physical Exam:  Constitutional: She appears well-developed. no distress HENT: oral mucosa pink and moist. Dentition good Head: Normocephalic.  Eyes: EOM are normal.  Neck: Normal range of motion. Neck supple. No thyromegaly present.  Cardiovascular:  Cardiac rate controlled. Regular rhythm. No murmurs Respiratory: Effort normal and breath sounds normal. No respiratory distress. no rales and wheezes GI: Soft. Bowel sounds are normal. She exhibits no distension.  Neurological: She is alert.   Gradual improvement in language/apraxia. RUE: 4- delt, bicep, tricep, wrist and hi. LUE: 5/5 prox to distal. Right pronator drift less. RLE: 4/5 hf, 4 ke and 4/5 ankle. LLE 4+/5 prox to 5/5 distal. Senses pain and LT in all 4's. Mild right central 7 and tongue deviation. DTR s 1+.  Skin: Skin is warm and dry.  Psychiatric: She has a  normal mood and affect. Her behavior is normal   Assessment/Plan: 1. Functional deficits secondary to left MCA infarct which require 3+ hours per day of interdisciplinary therapy in a comprehensive inpatient rehab setting. Physiatrist is providing close team supervision and 24 hour management of active medical problems listed below. Physiatrist and rehab team continue to assess barriers to discharge/monitor patient progress toward functional and medical goals. FIM: FIM - Bathing Bathing Steps Patient Completed: Chest, Right Arm, Left Arm, Buttocks, Front perineal area, Abdomen, Right upper leg, Left lower leg (including foot), Left upper leg, Right lower leg (including foot) Bathing: 4: Steadying assist  FIM - Upper Body Dressing/Undressing Upper body dressing/undressing steps patient completed: Thread/unthread right bra strap, Thread/unthread left bra strap, Hook/unhook bra, Thread/unthread right sleeve of pullover shirt/dresss, Thread/unthread left sleeve of pullover shirt/dress, Put head through opening of pull over shirt/dress, Pull shirt over trunk Upper body dressing/undressing: 5: Supervision: Safety issues/verbal cues FIM - Lower Body Dressing/Undressing Lower body dressing/undressing steps patient completed: Thread/unthread right underwear leg, Thread/unthread left underwear leg, Pull underwear up/down, Thread/unthread right pants leg, Thread/unthread left pants leg, Pull pants up/down, Fasten/unfasten pants, Don/Doff right sock, Don/Doff left sock, Don/Doff left shoe, Don/Doff right shoe, Fasten/unfasten right shoe, Fasten/unfasten left shoe Lower body dressing/undressing: 4: Steadying Assist  FIM - Toileting Toileting steps completed by patient: Adjust clothing prior to toileting, Performs perineal hygiene, Adjust clothing after toileting Toileting: 4: Steadying assist  FIM - Diplomatic Services operational officer Devices: Grab  bars Toilet Transfers: 4-From toilet/BSC: Min A  (steadying Pt. > 75%), 5-To toilet/BSC: Supervision (verbal cues/safety issues)  FIM - Banker Devices: Arm rests Bed/Chair Transfer: 5: Supine > Sit: Supervision (verbal cues/safety issues), 5: Sit > Supine: Supervision (verbal cues/safety issues), 4: Bed > Chair or W/C: Min A (steadying Pt. > 75%), 4: Chair or W/C > Bed: Min A (steadying Pt. > 75%)  FIM - Locomotion: Wheelchair Locomotion: Wheelchair: 1: Total Assistance/staff pushes wheelchair (Pt<25%) FIM - Locomotion: Ambulation Locomotion: Ambulation Assistive Devices: Other (comment) (HHA) Ambulation/Gait Assistance: 4: Min assist Locomotion: Ambulation: 4: Travels 150 ft or more with minimal assistance (Pt.>75%)  Comprehension Comprehension Mode: Auditory Comprehension: 4-Understands basic 75 - 89% of the time/requires cueing 10 - 24% of the time  Expression Expression Mode: Verbal Expression: 3-Expresses basic 50 - 74% of the time/requires cueing 25 - 50% of the time. Needs to repeat parts of sentences.  Social Interaction Social Interaction: 5-Interacts appropriately 90% of the time - Needs monitoring or encouragement for participation or interaction.  Problem Solving Problem Solving: 4-Solves basic 75 - 89% of the time/requires cueing 10 - 24% of the time  Memory Memory: 4-Recognizes or recalls 75 - 89% of the time/requires cueing 10 - 24% of the time  Medical Problem List and Plan: 1. Functional deficits secondary to cardioembolic left MCA infarct--continue therapies 2. DVT Prophylaxis/Anticoagulation: Eliquis. continue 3. Pain Management: Ultram as needed for breakthrough pain--states pain is under control 4. Mood/anxiety: Ativan 0.5 mg twice daily as needed. Providing reassurance and + reinforcement 5. Neuropsych: This patient is capable of making decisions on her own behalf. 6. Skin/Wound Care: Routine skin checks. Oob.  7. Fluids/Electrolytes/Nutrition:continue potassium  supplement. Encourage fluids 8. Dysphagia. Dysphagia #3 nectar thick liquids. tolerating 9. Hypertension/atrial fibrillation. Lopressor 12.5 mg daily for bp and rate controlled  -bp with some improvement-continue current doses of lopressor, lisinopril 20 mg daily, hydrochlorothiazide 25 mg daily 10. Diabetes mellitus of peripheral neuropathy. Hemoglobin A1c 11.8. Lantus insulin 10 units daily at bedtime. Check blood sugars before meals and at bedtime.  -adjusted lantus to 12 u qhs---observe for pattern today  LOS (Days) 3 A FACE TO FACE EVALUATION WAS PERFORMED  SWARTZ,ZACHARY T 06/19/2015 9:02 AM

## 2015-06-20 ENCOUNTER — Inpatient Hospital Stay (HOSPITAL_COMMUNITY): Payer: Medicare Other | Admitting: Occupational Therapy

## 2015-06-20 ENCOUNTER — Inpatient Hospital Stay (HOSPITAL_COMMUNITY): Payer: Medicare Other | Admitting: *Deleted

## 2015-06-20 ENCOUNTER — Inpatient Hospital Stay (HOSPITAL_COMMUNITY): Payer: Medicare Other | Admitting: Speech Pathology

## 2015-06-20 DIAGNOSIS — R001 Bradycardia, unspecified: Secondary | ICD-10-CM

## 2015-06-20 LAB — GLUCOSE, CAPILLARY
GLUCOSE-CAPILLARY: 172 mg/dL — AB (ref 65–99)
GLUCOSE-CAPILLARY: 195 mg/dL — AB (ref 65–99)
GLUCOSE-CAPILLARY: 136 mg/dL — AB (ref 65–99)
Glucose-Capillary: 124 mg/dL — ABNORMAL HIGH (ref 65–99)

## 2015-06-20 MED ORDER — METOPROLOL TARTRATE 12.5 MG HALF TABLET
12.5000 mg | ORAL_TABLET | Freq: Every day | ORAL | Status: DC
  Administered 2015-06-22 – 2015-06-27 (×5): 12.5 mg via ORAL
  Filled 2015-06-20 (×6): qty 1

## 2015-06-20 MED ORDER — INSULIN GLARGINE 100 UNIT/ML ~~LOC~~ SOLN
15.0000 [IU] | Freq: Every day | SUBCUTANEOUS | Status: DC
  Administered 2015-06-20 – 2015-06-21 (×2): 15 [IU] via SUBCUTANEOUS
  Filled 2015-06-20 (×3): qty 0.15

## 2015-06-20 NOTE — Progress Notes (Signed)
[place] PHYSICAL MEDICINE & REHABILITATION     PROGRESS NOTE    Subjective/Complaints:   ROS: Pt denies fever, rash/itching, headache, blurred or double vision, nausea, vomiting, abdominal pain, diarrhea, chest pain, shortness of breath, palpitations, dysuria, dizziness, neck or back pain, bleeding, anxiety, or depression   Objective: Vital Signs: Blood pressure 142/71, pulse 53, temperature 97.8 F (36.6 C), temperature source Oral, resp. rate 18, height  (1.676 m), weight 72.3 kg (159 lb 6.3 oz), SpO2 96 %. No results found. No results for input(s): WBC, HGB, HCT, PLT in the last 72 hours. No results for input(s): NA, K, CL, GLUCOSE, BUN, CREATININE, CALCIUM in the last 72 hours.  Invalid input(s): CO CBG (last 3)   Recent Labs  06/19/15 1653 06/19/15 2046 06/20/15 0632  GLUCAP 142* 183* 172*    Wt Readings from Last 3 Encounters:  06/17/15 72.3 kg (159 lb 6.3 oz)  06/13/15 71.215 kg (157 lb)  10/13/14 71.215 kg (157 lb)    Physical Exam:  Constitutional: She appears well-developed. no distress HENT: oral mucosa pink and moist. Dentition good Head: Normocephalic.  Eyes: EOM are normal.  Neck: Normal range of motion. Neck supple. No thyromegaly present.  Cardiovascular:  Cardiac rate controlled. Regular rhythm. No murmurs Respiratory: Effort normal and breath sounds normal. No respiratory distress. no rales and wheezes GI: Soft. Bowel sounds are normal. She exhibits no distension.  Neurological: She is alert.   Gradual improvement in language/apraxia. RUE: 4- delt, bicep, tricep, wrist and hi. LUE: 5/5 prox to distal. Right pronator drift less. RLE: 4/5 hf, 4 ke and 4/5 ankle. LLE 4+/5 prox to 5/5 distal. Senses pain and LT in all 4's. Mild right central 7 and tongue deviation. DTR s 1+.  Skin: Skin is warm and dry.  Psychiatric: She has a normal mood and affect. Her behavior is normal   Assessment/Plan: 1. Functional deficits secondary to left  MCA infarct which require 3+ hours per day of interdisciplinary therapy in a comprehensive inpatient rehab setting. Physiatrist is providing close team supervision and 24 hour management of active medical problems listed below. Physiatrist and rehab team continue to assess barriers to discharge/monitor patient progress toward functional and medical goals. FIM: FIM - Bathing Bathing Steps Patient Completed: Chest, Right Arm, Left Arm, Buttocks, Front perineal area, Abdomen, Right upper leg, Left lower leg (including foot), Left upper leg, Right lower leg (including foot) Bathing: 4: Steadying assist  FIM - Upper Body Dressing/Undressing Upper body dressing/undressing steps patient completed: Thread/unthread right bra strap, Thread/unthread left bra strap, Hook/unhook bra, Thread/unthread right sleeve of pullover shirt/dresss, Thread/unthread left sleeve of pullover shirt/dress, Put head through opening of pull over shirt/dress, Pull shirt over trunk Upper body dressing/undressing: 5: Supervision: Safety issues/verbal cues FIM - Lower Body Dressing/Undressing Lower body dressing/undressing steps patient completed: Thread/unthread right underwear leg, Thread/unthread left underwear leg, Pull underwear up/down, Thread/unthread right pants leg, Thread/unthread left pants leg, Pull pants up/down, Fasten/unfasten pants, Don/Doff right sock, Don/Doff left sock, Don/Doff left shoe, Don/Doff right shoe, Fasten/unfasten right shoe, Fasten/unfasten left shoe Lower body dressing/undressing: 4: Steadying Assist  FIM - Toileting Toileting steps completed by patient: Adjust clothing prior to toileting, Performs perineal hygiene, Adjust clothing after toileting Toileting: 4: Steadying assist  FIM - Diplomatic Services operational officer Devices: Grab bars Toilet Transfers: 4-From toilet/BSC: Min A (steadying Pt. > 75%), 5-To toilet/BSC: Supervision (verbal cues/safety issues)  FIM - Network engineer Assistive Devices: Arm rests Bed/Chair Transfer: 5: Bed >  Chair or W/C: Supervision (verbal cues/safety issues), 5: Chair or W/C > Bed: Supervision (verbal cues/safety issues)  FIM - Locomotion: Wheelchair Locomotion: Wheelchair: 0: Activity did not occur FIM - Locomotion: Ambulation Locomotion: Ambulation Assistive Devices: Cane - Quad (no AD and [employer]) Ambulation/Gait Assistance: 4: Min assist Locomotion: Ambulation: 4: Travels 150 ft or more with minimal assistance (Pt.>75%)  Comprehension Comprehension Mode: Auditory Comprehension: 4-Understands basic 75 - 89% of the time/requires cueing 10 - 24% of the time  Expression Expression Mode: Verbal Expression: 4-Expresses basic 75 - 89% of the time/requires cueing 10 - 24% of the time. Needs helper to occlude trach/needs to repeat words.  Social Interaction Social Interaction: 5-Interacts appropriately 90% of the time - Needs monitoring or encouragement for participation or interaction.  Problem Solving Problem Solving: 4-Solves basic 75 - 89% of the time/requires cueing 10 - 24% of the time  Memory Memory: 5-Recognizes or recalls 90% of the time/requires cueing < 10% of the time  Medical Problem List and Plan: 1. Functional deficits secondary to cardioembolic left MCA infarct--continue therapies 2. DVT Prophylaxis/Anticoagulation: Eliquis. continue 3. Pain Management: Ultram as needed for breakthrough pain--states pain is under control 4. Mood/anxiety: Ativan 0.5 mg twice daily as needed. Providing reassurance and + reinforcement 5. Neuropsych: This patient is capable of making decisions on her own behalf. 6. Skin/Wound Care: Routine skin checks. Oob.  7. Fluids/Electrolytes/Nutrition:continue potassium supplement. Encourage fluids, took only yesterday 8. Dysphagia. Dysphagia #3 nectar thick liquids. tolerating 9. Hypertension/atrial fibrillation. Lopressor 12.5 mg daily for bp and rate  controlled, HR on low side write parameters  -bp in good range-continue current doses of lopressor, lisinopril 20 mg daily, hydrochlorothiazide 25 mg daily 10. Diabetes mellitus of peripheral neuropathy. Hemoglobin A1c 11.8. Marland Kitchen Check blood sugars before meals and at bedtime.  -adjusted lantus to 12 u qhs---am CBG still above goal, will increase to 15U  LOS (Days) 4 A FACE TO FACE EVALUATION WAS PERFORMED  KIRSTEINS,ANDREW E 06/20/2015 10:07 AM

## 2015-06-20 NOTE — Progress Notes (Signed)
Occupational Therapy Session Note  Patient Details  Name: Holly Sparks MRN: [id number] Date of Birth: [birth year]  Today's Date: 06/20/2015 OT Individual Time: 4098-1191 and 4782-9562 OT Individual Time Calculation (min): 54 min and 45 min    Short Term Goals: Week 1:  OT Short Term Goal 1 (Week 1): STGs= LTGs secondary to short LOS  Skilled Therapeutic Interventions/Progress Updates:  Session 1: Upon entering the room, pt seated on EOB awaiting therapist. Pt with no c/o pain this session but does report not sleeping well as she had to get up multiple times to use bathroom during night with staff. Skilled OT session with focus on self care retraining, standing balance, functional mobility and transfers. Pt ambulated in room without use of AD with close supervision - min guard to obtain clothing items from closet and all needed items for bathing. Pt performed shower transfer with steady assist and use of grab bars. Pt bathing self with min verbal cues for initiation. Pt standing to wash buttocks and peri area with steady assist for safety. Pt requiring min verbal cues for safety as well as energy conservation during session as well. Pt returning to sit on edge of bed for clothing management with steady assist during LB for balance. Pt then sit <>stand at sink with supervision for brushing teeth, applying makeup, and brushing hair. Pt ambulated 150' without AD and close supervision - min guard before returning back to room. Pt seated in wheelchair with call bell and all needed items within reach upon exiting the room.   Session 2: Upon entering the room, pt supine in bed with family members present. Pt with no c/o pain this session and family staying back in room during OT session. Pt ambulating without AD 150' to day room for session. Pt standing for 13 min and then 16 min respectively during session at table top for card game. Pt shown deck on cards with familiar game of old maid on it. Pt  attempting to verbalize to therapist how game must be played but unable to relay any information other than pointing at old maid card and saying , "I don't want to have her." Pt describing to therapist the activities she performed during previous sessions. She was also able to recall prior OT session but not that it was with this therapist. Pt unable to remember this familiar therapists name even with a choice of 2. Pt requiring min verbal cues for and increased time for turn taking and to attend to task. Pt returning to room in same manner as above and requesting to return to bed secondary to fatigue. Bed alarm activated, call bell within reach, and all other needed items within reach upon exiting the room.   Therapy Documentation Precautions:  Precautions Precautions: Fall Restrictions Weight Bearing Restrictions: No  See FIM for current functional status  Therapy/Group: Individual Therapy  Lowella Grip 06/20/2015, 9:57 AM

## 2015-06-20 NOTE — Progress Notes (Signed)
Physical Therapy Session Note  Patient Details  Name: Holly Sparks MRN: [id number] Date of Birth: 11/29/1940  Today's Date: 06/20/2015 PT Individual Time: 1100-1200 PT Individual Time Calculation (min): 60 min   Short Term Goals: Week 1:  PT Short Term Goal 1 (Week 1): = LTGs  Skilled Therapeutic Interventions/Progress Updates:    Patient received sitting in wheelchair. Session focused on cognitive remediation with balance and functional mobility tasks. Gait training ~200' x2 with several smaller bouts 50-150' without AD and close supervision with intermittent min guard for LOB due to narrow BOS or mild scissoring. Emphasis during gait on increased gait speed, which improves patient's fluidity of gait pattern, and increased reciprocal arm swing. Standing on foam ball toss with alphabetical naming of food with each catch. Patient requires min cues for initial instruction/adherence to rules, then progresses to no cues until reaching the letter G. From letter G on, patient verbally perseverative on prior letters or foods named and requires max cues for which letter she is on, which letter comes next alphabetically, or for finding a food that begins with corresponding letter. Patient requires several seated rest breaks as well as cognitive rest breaks when patient verbally perseverates.  Furniture transfers off low, cushioned couch in family room with supervision. Patient returned to room and left sitting in wheelchair with all needs within reach.  Therapy Documentation Precautions:  Precautions Precautions: Fall Restrictions Weight Bearing Restrictions: No Pain: Pain Assessment Pain Assessment: No/denies pain Pain Score: 5  Locomotion : Ambulation Ambulation/Gait Assistance: 4: Min guard;5: Supervision   See FIM for current functional status  Therapy/Group: Individual Therapy  Chipper Herb. Holly Sparks, PT, DPT 06/20/2015, 11:57 AM

## 2015-06-20 NOTE — Progress Notes (Signed)
Speech Language Pathology Daily Session Note  Patient Details  Name: Holly Sparks MRN: [id number] Date of Birth: 05-04-1941  Today's Date: 06/20/2015 SLP Individual Time: 1300-1345 SLP Individual Time Calculation (min): 45 min  Short Term Goals: Week 1: SLP Short Term Goal 1 (Week 1): Patient will sustain attention to functional and familiar tasks for 10 minutes with min A verbal cues for redirection.  SLP Short Term Goal 2 (Week 1): Patient will self-monitor verbal errors with Mod A question and verbal cues.  SLP Short Term Goal 3 (Week 1): Patient will follow 2 step commands with 75% accuracy and Mod A multimodal cues.  SLP Short Term Goal 4 (Week 1): Patient will name functional items with 75% accuracy with Min A multimodal cues.  SLP Short Term Goal 5 (Week 1): Patient will match a written word to an item from a field of 2 with 75% accuracy and Min A multimodal cues.  SLP Short Term Goal 6 (Week 1): Patient will consume trials of thin liquids without overt s/s of aspiration with supervision cues over 3 conescutive sessions to assess readiness for repeat MBS.   Skilled Therapeutic Interventions: Skilled treatment session focused on cognitive goals. SLP facilitated session by providing min-mod A for memory with therapy schedule and recalling information from short paragraph; SLP provided verbal/semantic cues and patient provided external visual aid for therapy schedule.  She required min A with basic problem solving scenarios re: daily activities in the hospital.  She required extra time and verbal prompts to indicate she needs assistance to get out of/in bed.  She was mod [place] of indicating how to get her RN's attention by finding her call bell button and locating the RN's button.  Continue with current plan of care.   FIM:  Comprehension Comprehension Mode: Auditory Comprehension: 4-Understands basic 75 - 89% of the time/requires cueing 10 - 24% of the  time Expression Expression Mode: Verbal Expression: 4-Expresses basic 75 - 89% of the time/requires cueing 10 - 24% of the time. Needs helper to occlude trach/needs to repeat words. Social Interaction Social Interaction: 4-Interacts appropriately 75 - 89% of the time - Needs redirection for appropriate language or to initiate interaction. Problem Solving Problem Solving: 4-Solves basic 75 - 89% of the time/requires cueing 10 - 24% of the time Memory Memory: 3-Recognizes or recalls 50 - 74% of the time/requires cueing 25 - 49% of the time  Pain Pain Assessment Pain Assessment: 0-10 Pain Score: 5  Pain Type: Acute pain Pain Location: Abdomen Pain Orientation: Right Pain Descriptors / Indicators: Aching Pain Onset: On-going Patients Stated Pain Goal: 0 Pain Intervention(s): Medication (See eMAR) Multiple Pain Sites: No  Therapy/Group: Individual Therapy   Cranford Mon, MA CCC-SLP Cranford Mon A 06/20/2015, 4:02 PM

## 2015-06-21 ENCOUNTER — Inpatient Hospital Stay (HOSPITAL_COMMUNITY): Payer: Medicare Other

## 2015-06-21 DIAGNOSIS — I69398 Other sequelae of cerebral infarction: Secondary | ICD-10-CM

## 2015-06-21 DIAGNOSIS — R269 Unspecified abnormalities of gait and mobility: Secondary | ICD-10-CM

## 2015-06-21 LAB — GLUCOSE, CAPILLARY
GLUCOSE-CAPILLARY: 136 mg/dL — AB (ref 65–99)
GLUCOSE-CAPILLARY: 211 mg/dL — AB (ref 65–99)
Glucose-Capillary: 201 mg/dL — ABNORMAL HIGH (ref 65–99)
Glucose-Capillary: 187 mg/dL — ABNORMAL HIGH (ref 65–99)

## 2015-06-21 MED ORDER — HYDROCHLOROTHIAZIDE 12.5 MG PO CAPS
12.5000 mg | ORAL_CAPSULE | Freq: Every day | ORAL | Status: DC
  Administered 2015-06-22 – 2015-06-27 (×6): 12.5 mg via ORAL
  Filled 2015-06-21 (×6): qty 1

## 2015-06-21 MED ORDER — LISINOPRIL 20 MG PO TABS
20.0000 mg | ORAL_TABLET | Freq: Every day | ORAL | Status: DC
  Administered 2015-06-22 – 2015-06-26 (×5): 20 mg via ORAL
  Filled 2015-06-21 (×5): qty 1

## 2015-06-21 NOTE — Progress Notes (Signed)
[place] PHYSICAL MEDICINE & REHABILITATION     PROGRESS NOTE    Subjective/Complaints: Per RN , BP low metoprolol held.  Trying to drink more  ROS: Pt denies fever, rash/itching, headache, blurred or double vision, nausea, vomiting, abdominal pain, diarrhea, chest pain, shortness of breath,no lightheadedness   Objective: Vital Signs: Blood pressure 96/62, pulse 60, temperature 98.1 F (36.7 C), temperature source Oral, resp. rate 18, height  (1.676 m), weight 72.3 kg (159 lb 6.3 oz), SpO2 97 %. No results found. No results for input(s): WBC, HGB, HCT, PLT in the last 72 hours. No results for input(s): NA, K, CL, GLUCOSE, BUN, CREATININE, CALCIUM in the last 72 hours.  Invalid input(s): CO CBG (last 3)   Recent Labs  06/20/15 1608 06/20/15 2053 06/21/15 0659  GLUCAP 124* 136* 136*    Wt Readings from Last 3 Encounters:  06/17/15 72.3 kg (159 lb 6.3 oz)  06/13/15 71.215 kg (157 lb)  10/13/14 71.215 kg (157 lb)    Physical Exam:  Constitutional: She appears well-developed. no distress HENT: oral mucosa pink and moist. Dentition good Head: Normocephalic.  Eyes: EOM are normal.  Neck: Normal range of motion. Neck supple. No thyromegaly present.  Cardiovascular:  Cardiac rate controlled. Regular rhythm. No murmurs Respiratory: Effort normal and breath sounds normal. No respiratory distress. no rales and wheezes GI: Soft. Bowel sounds are normal. She exhibits no distension.  Neurological: She is alert.   Gradual improvement in language/apraxia. RUE: 4- delt, bicep, tricep, wrist and hi. LUE: 5/5 prox to distal. Right pronator drift less. RLE: 4/5 hf, 4 ke and 4/5 ankle. LLE 4+/5 prox to 5/5 distal. Senses pain and LT in all 4's. Mild right central 7 and tongue deviation. DTR s 1+.  Skin: Skin is warm and dry.  Psychiatric: She has a normal mood and affect. Her behavior is normal   Assessment/Plan: 1. Functional deficits secondary to left MCA infarct  which require 3+ hours per day of interdisciplinary therapy in a comprehensive inpatient rehab setting. Physiatrist is providing close team supervision and 24 hour management of active medical problems listed below. Physiatrist and rehab team continue to assess barriers to discharge/monitor patient progress toward functional and medical goals. FIM: FIM - Bathing Bathing Steps Patient Completed: Chest, Right Arm, Left Arm, Buttocks, Front perineal area, Abdomen, Right upper leg, Left lower leg (including foot), Left upper leg, Right lower leg (including foot) Bathing: 4: Steadying assist  FIM - Upper Body Dressing/Undressing Upper body dressing/undressing steps patient completed: Thread/unthread right bra strap, Thread/unthread left bra strap, Hook/unhook bra, Thread/unthread right sleeve of pullover shirt/dresss, Thread/unthread left sleeve of pullover shirt/dress, Put head through opening of pull over shirt/dress, Pull shirt over trunk Upper body dressing/undressing: 5: Supervision: Safety issues/verbal cues FIM - Lower Body Dressing/Undressing Lower body dressing/undressing steps patient completed: Thread/unthread right underwear leg, Thread/unthread left underwear leg, Pull underwear up/down, Thread/unthread right pants leg, Thread/unthread left pants leg, Pull pants up/down, Fasten/unfasten pants, Don/Doff right sock, Don/Doff left sock, Don/Doff left shoe, Don/Doff right shoe, Fasten/unfasten right shoe, Fasten/unfasten left shoe Lower body dressing/undressing: 4: Steadying Assist  FIM - Toileting Toileting steps completed by patient: Adjust clothing prior to toileting, Performs perineal hygiene, Adjust clothing after toileting Toileting: 4: Steadying assist  FIM - Diplomatic Services operational officer Devices: Grab bars Toilet Transfers: 4-From toilet/BSC: Min A (steadying Pt. > 75%), 5-To toilet/BSC: Supervision (verbal cues/safety issues)  FIM - Programmer, systems Assistive Devices: Arm rests Bed/Chair Transfer: 5: Bed >  Chair or W/C: Supervision (verbal cues/safety issues), 5: Chair or W/C > Bed: Supervision (verbal cues/safety issues)  FIM - Locomotion: Wheelchair Locomotion: Wheelchair: 0: Activity did not occur FIM - Locomotion: Ambulation Locomotion: Ambulation Assistive Devices: Other (comment) (no AD) Ambulation/Gait Assistance: 4: Min guard, 5: Supervision Locomotion: Ambulation: 4: Travels 150 ft or more with minimal assistance (Pt.>75%)  Comprehension Comprehension Mode: Auditory Comprehension: 4-Understands basic 75 - 89% of the time/requires cueing 10 - 24% of the time  Expression Expression Mode: Verbal Expression: 4-Expresses basic 75 - 89% of the time/requires cueing 10 - 24% of the time. Needs helper to occlude trach/needs to repeat words.  Social Interaction Social Interaction: 5-Interacts appropriately 90% of the time - Needs monitoring or encouragement for participation or interaction.  Problem Solving Problem Solving: 4-Solves basic 75 - 89% of the time/requires cueing 10 - 24% of the time  Memory Memory: 5-Recognizes or recalls 90% of the time/requires cueing < 10% of the time  Medical Problem List and Plan: 1. Functional deficits secondary to cardioembolic left MCA infarct--continue therapies 2. DVT Prophylaxis/Anticoagulation: Eliquis. continue 3. Pain Management: Ultram as needed for breakthrough pain--states pain is under control 4. Mood/anxiety: Ativan 0.5 mg twice daily as needed. Providing reassurance and + reinforcement 5. Neuropsych: This patient is capable of making decisions on her own behalf. 6. Skin/Wound Care: Routine skin checks. Oob.  7. Fluids/Electrolytes/Nutrition:continue potassium supplement. Encourage fluids, took only yesterday 8. Dysphagia. Dysphagia #3 nectar thick liquids. Tolerating takes ~50% meals 9. Hypertension/atrial fibrillation. Lopressor 12.5 mg daily for bp and rate  controlled, HR on low side write parameters  -bp ion low side-continue current doses of lopressor, lisinopril 20 mg daily, hydrochlorothiazide 25 mg daily will reduce to 12.5mg  10. Diabetes mellitus of peripheral neuropathy. Hemoglobin A1c 11.8. Marland Kitchen Check blood sugars before meals and at bedtime.  -adjusted lantus to 15 u qhs---am CBG improved goal, cont 15U  LOS (Days) 5 A FACE TO FACE EVALUATION WAS PERFORMED  Claudette Laws E 06/21/2015 9:58 AM

## 2015-06-21 NOTE — Progress Notes (Signed)
Physical Therapy Session Note  Patient Details  Name: Holly Sparks MRN: [id number] Date of Birth: [birth year]  Today's Date: 06/21/2015 PT Individual Time: 3086-5784 PT Individual Time Calculation (min): 45 min   Short Term Goals: Week 1:  PT Short Term Goal 1 (Week 1): = LTGs  Skilled Therapeutic Interventions/Progress Updates:    BP running low this AM and discussed with RN. Donned Tedhose to BLE to improve circulation. BP values taken throughout and see Doc Flowsheets for details.   Gait on unit in hallway with focus on improving gait speed, posture, step length and balance while performing cognitive task for dual task training at overall steady A level for balance and mod to max cues for cognitive task (naming food items by corresponding letter of alphabet). End of session returned to bed to rest with all needs in reach and bed alarm on.  Therapy Documentation Precautions:  Precautions Precautions: Fall Restrictions Weight Bearing Restrictions: No  Pain:  Denies pain.  See FIM for current functional status  Therapy/Group: Individual Therapy  Karolee Stamps Darrol Poke, PT, DPT  06/21/2015, 11:53 AM

## 2015-06-22 ENCOUNTER — Inpatient Hospital Stay (HOSPITAL_COMMUNITY): Payer: Medicare Other | Admitting: Physical Therapy

## 2015-06-22 ENCOUNTER — Inpatient Hospital Stay (HOSPITAL_COMMUNITY): Payer: Medicare Other | Admitting: Occupational Therapy

## 2015-06-22 ENCOUNTER — Inpatient Hospital Stay (HOSPITAL_COMMUNITY): Payer: Medicare Other | Admitting: Speech Pathology

## 2015-06-22 ENCOUNTER — Inpatient Hospital Stay (HOSPITAL_COMMUNITY): Payer: Medicare Other | Admitting: *Deleted

## 2015-06-22 LAB — GLUCOSE, CAPILLARY
GLUCOSE-CAPILLARY: 175 mg/dL — AB (ref 65–99)
GLUCOSE-CAPILLARY: 185 mg/dL — AB (ref 65–99)
GLUCOSE-CAPILLARY: 202 mg/dL — AB (ref 65–99)
Glucose-Capillary: 144 mg/dL — ABNORMAL HIGH (ref 65–99)

## 2015-06-22 MED ORDER — INSULIN GLARGINE 100 UNIT/ML ~~LOC~~ SOLN
20.0000 [IU] | Freq: Every day | SUBCUTANEOUS | Status: DC
  Administered 2015-06-22 – 2015-06-26 (×5): 20 [IU] via SUBCUTANEOUS
  Filled 2015-06-22 (×6): qty 0.2

## 2015-06-22 NOTE — Progress Notes (Signed)
Speech Language Pathology Daily Session Notes  Patient Details  Name: Holly Sparks MRN: [id number] Date of Birth: 25-Jun-1941  Today's Date: 06/22/2015 Session 1: SLP Individual Time: 0900-1000 SLP Individual Time Calculation (min): 60 min   Session 2: SLP Individual Time: 1300-1430 SLP Individual Time Calculation (min): 90 min  Short Term Goals: Week 1: SLP Short Term Goal 1 (Week 1): Patient will sustain attention to functional and familiar tasks for 10 minutes with min A verbal cues for redirection.  SLP Short Term Goal 2 (Week 1): Patient will self-monitor verbal errors with Mod A question and verbal cues.  SLP Short Term Goal 3 (Week 1): Patient will follow 2 step commands with 75% accuracy and Mod A multimodal cues.  SLP Short Term Goal 4 (Week 1): Patient will name functional items with 75% accuracy with Min A multimodal cues.  SLP Short Term Goal 5 (Week 1): Patient will match a written word to an item from a field of 2 with 75% accuracy and Min A multimodal cues.  SLP Short Term Goal 6 (Week 1): Patient will consume trials of thin liquids without overt s/s of aspiration with supervision cues over 3 conescutive sessions to assess readiness for repeat MBS.   Skilled Therapeutic Interventions:  Session 1: Skilled treatment session focused on cognitive-linguistic goals. SLP facilitated session by providing Mod A semantic cues for generative naming task within a specific category and Max-total A multimodal cues to self-monitor and correct errors during a written expression task at the word level. Patient demonstrated selective attention to task in a mildly distracting environment for 30 minutes with supervision verbal cues. Patient left upright in recliner with all needs within reach and visitor present. Continue with current plan of care.   Session 2: Skilled treatment session focused on community reintegration.  Patient participated in outing to [facility] department store with Min A for  functional mobility and Mod-Max A multimodal cues for functional communication at the phrase level. Suspect increased difficulty with language was due to fatigue and over stimulation.  Please see goal sheet in shadow chart for further details. Patient left supine in bed with all needs within reach and alarm on. Continue with current plan of care.    FIM:  Comprehension Comprehension Mode: Auditory Comprehension: 3-Understands basic 50 - 74% of the time/requires cueing 25 - 50%  of the time Expression Expression Mode: Verbal Expression: 3-Expresses basic 50 - 74% of the time/requires cueing 25 - 50% of the time. Needs to repeat parts of sentences. Social Interaction Social Interaction: 5-Interacts appropriately 90% of the time - Needs monitoring or encouragement for participation or interaction. Problem Solving Problem Solving: 4-Solves basic 75 - 89% of the time/requires cueing 10 - 24% of the time Memory Memory: 5-Recognizes or recalls 90% of the time/requires cueing < 10% of the time  Pain Pain Assessment Pain Assessment: No/denies pain Pain Score: 0-No pain  Therapy/Group: Individual Therapy  Saren Corkern 06/22/2015, 3:46 PM

## 2015-06-22 NOTE — Progress Notes (Signed)
Occupational Therapy Session Note  Patient Details  Name: MEESHA SEK MRN: [id number] Date of Birth: 08-19-1941  Today's Date: 06/22/2015 OT Individual Time: 0800-0900 OT Individual Time Calculation (min): 60 min    Short Term Goals: Week 1:  OT Short Term Goal 1 (Week 1): STGs= LTGs secondary to short LOS  Skilled Therapeutic Interventions/Progress Updates:    Engaged in therapeutic activity with focus on procedural memory, functional mobility in home environment, safety, and word finding.  Pt reports already dressed and ready for the day.  Reports going on community outing that she is excited about.  Ambulated to ADL apt with quad cane with pt requiring cues for technique and to keep in contact with floor as pt would tend to walk just carrying cane. Pt fatigued during ambulation and requesting to switch hands on cane, ambulated rest of distance without with supervision.  In ADL apt discussed meal prep goal as pt reports she would cook, had difficulty with word finding when naming items she would cook.  Simulated process of making spaghetti with pt going through motions with retrieving pots and pans. Min cues for safety with positioning of pots and when reaching across hot burner.  Recommend supervision with all mobility and higher level IADLs.  Engaged in laundry task with retrieving items from floor and placing in drawer as well as folding towels and hanging in bathroom.  Returned to room ambulating without AD and supervision.   Therapy Documentation Precautions:  Precautions Precautions: Fall Restrictions Weight Bearing Restrictions: No General:   Vital Signs: Therapy Vitals Pulse Rate: 73 BP: (!) 144/72 mmHg Pain:  Pt with no c/o pain  See FIM for current functional status  Therapy/Group: Individual Therapy  Rosalio Loud 06/22/2015, 9:11 AM

## 2015-06-22 NOTE — Progress Notes (Signed)
Complained of nausea at 0511, PRN zofran given. Expressive aphasia, difficult to figure out needs at times. Holly Sparks

## 2015-06-22 NOTE — Progress Notes (Signed)
[place] PHYSICAL MEDICINE & REHABILITATION     PROGRESS NOTE    Subjective/Complaints: Having occasional nausea. Still sometime up and down a bit.  ROS: Pt denies fever, rash/itching, headache, blurred or double vision, nausea, vomiting, abdominal pain, diarrhea, chest pain, shortness of breath,no lightheadedness   Objective: Vital Signs: Blood pressure 143/77, pulse 69, temperature 98.3 F (36.8 C), temperature source Oral, resp. rate 18, height 5\' 6"  (1.676 m), weight 72.3 kg (159 lb 6.3 oz), SpO2 98 %. No results found. No results for input(s): WBC, HGB, HCT, PLT in the last 72 hours. No results for input(s): NA, K, CL, GLUCOSE, BUN, CREATININE, CALCIUM in the last 72 hours.  Invalid input(s): CO CBG (last 3)   Recent Labs  06/21/15 1612 06/21/15 2118 06/22/15 0641  GLUCAP 211* 187* 175*    Wt Readings from Last 3 Encounters:  06/17/15 72.3 kg (159 lb 6.3 oz)  06/13/15 71.215 kg (157 lb)  10/13/14 71.215 kg (157 lb)    Physical Exam:  Constitutional: She appears well-developed. no distress HENT: oral mucosa pink and moist. Dentition good Head: Normocephalic.  Eyes: EOM are normal.  Neck: Normal range of motion. Neck supple. No thyromegaly present.  Cardiovascular:  Cardiac rate controlled. Regular rhythm. No murmurs Respiratory: Effort normal and breath sounds normal. No respiratory distress. no rales and wheezes GI: Soft. Bowel sounds are normal. She exhibits no distension.  Neurological: She is alert.   Gradual improvement in language/apraxia---still present. RUE: 4+ delt, bicep, tricep, wrist and hi. LUE: 5/5 prox to distal. Right pronator drift resolved. RLE: 4/5 hf, 4 ke and 4/5 ankle. LLE 4+/5 prox to 5/5 distal. Senses pain and LT in all 4's. Mild right central 7 and tongue deviation. DTR s 1+.  Skin: Skin is warm and dry.  Psychiatric: She has a normal mood and affect. Her behavior is normal   Assessment/Plan: 1. Functional deficits  secondary to left MCA infarct which require 3+ hours per day of interdisciplinary therapy in a comprehensive inpatient rehab setting. Physiatrist is providing close team supervision and 24 hour management of active medical problems listed below. Physiatrist and rehab team continue to assess barriers to discharge/monitor patient progress toward functional and medical goals. FIM: FIM - Bathing Bathing Steps Patient Completed: Chest, Right Arm, Left Arm, Buttocks, Front perineal area, Abdomen, Right upper leg, Left lower leg (including foot), Left upper leg, Right lower leg (including foot) Bathing: 4: Steadying assist  FIM - Upper Body Dressing/Undressing Upper body dressing/undressing steps patient completed: Thread/unthread right bra strap, Thread/unthread left bra strap, Hook/unhook bra, Thread/unthread right sleeve of pullover shirt/dresss, Thread/unthread left sleeve of pullover shirt/dress, Put head through opening of pull over shirt/dress, Pull shirt over trunk Upper body dressing/undressing: 5: Supervision: Safety issues/verbal cues FIM - Lower Body Dressing/Undressing Lower body dressing/undressing steps patient completed: Thread/unthread right underwear leg, Thread/unthread left underwear leg, Pull underwear up/down, Thread/unthread right pants leg, Thread/unthread left pants leg, Pull pants up/down, Fasten/unfasten pants, Don/Doff right sock, Don/Doff left sock, Don/Doff left shoe, Don/Doff right shoe, Fasten/unfasten right shoe, Fasten/unfasten left shoe Lower body dressing/undressing: 4: Steadying Assist  FIM - Toileting Toileting steps completed by patient: Performs perineal hygiene, Adjust clothing after toileting Toileting: 4: Steadying assist  FIM - Diplomatic Services operational officer Devices: Grab bars Toilet Transfers: 4-From toilet/BSC: Min A (steadying Pt. > 75%), 5-To toilet/BSC: Supervision (verbal cues/safety issues)  FIM - Landscape architect Devices: Arm rests Bed/Chair Transfer: 5: Bed > Chair or W/C: Supervision (verbal  cues/safety issues), 5: Chair or W/C > Bed: Supervision (verbal cues/safety issues), 5: Sit > Supine: Supervision (verbal cues/safety issues)  FIM - Locomotion: Wheelchair Locomotion: Wheelchair: 0: Activity did not occur FIM - Locomotion: Ambulation Locomotion: Ambulation Assistive Devices: Other (comment) (no AD) Ambulation/Gait Assistance: 4: Min guard, 5: Supervision Locomotion: Ambulation: 4: Travels 150 ft or more with minimal assistance (Pt.>75%)  Comprehension Comprehension Mode: Auditory Comprehension: 4-Understands basic 75 - 89% of the time/requires cueing 10 - 24% of the time  Expression Expression Mode: Verbal Expression: 4-Expresses basic 75 - 89% of the time/requires cueing 10 - 24% of the time. Needs helper to occlude trach/needs to repeat words.  Social Interaction Social Interaction: 5-Interacts appropriately 90% of the time - Needs monitoring or encouragement for participation or interaction.  Problem Solving Problem Solving: 4-Solves basic 75 - 89% of the time/requires cueing 10 - 24% of the time  Memory Memory: 5-Recognizes or recalls 90% of the time/requires cueing < 10% of the time  Medical Problem List and Plan: 1. Functional deficits secondary to cardioembolic left MCA infarct--continue therapies 2. DVT Prophylaxis/Anticoagulation: Eliquis. continue 3. Pain Management: Ultram as needed for breakthrough pain--states pain is under control 4. Mood/anxiety: Ativan 0.5 mg twice daily as needed. Providing reassurance and + reinforcement 5. Neuropsych: This patient is capable of making decisions on her own behalf. 6. Skin/Wound Care: Routine skin checks. Oob.  7. Fluids/Electrolytes/Nutrition:continue potassium supplement. Encourage fluids, took only yesterday 8. Dysphagia. Dysphagia #3 nectar thick liquids. Tolerating takes ~50% meals 9. Hypertension/atrial  fibrillation.  HR on low side write parameters  -12.5mg  lopressor, lisinopril 20 mg daily,   -hydrochlorothiazide  reduced to 12.5mg ---follow today 10. Diabetes mellitus of peripheral neuropathy. Hemoglobin A1c 11.8. Marland Kitchen Check blood sugars before meals and at bedtime.  -adjust lantus to 20 u qhs---further titration as indicated  LOS (Days) 6 A FACE TO FACE EVALUATION WAS PERFORMED  Holly Sparks 06/22/2015 8:41 AM

## 2015-06-22 NOTE — Progress Notes (Signed)
Physical Therapy Session Note  Patient Details  Name: Holly Sparks MRN: [id number] Date of Birth: 1940-12-15  Today's Date: 06/22/2015 PT Individual Time: 1100-1200 PT Individual Time Calculation (min): 60 min   Short Term Goals: Week 1:  PT Short Term Goal 1 (Week 1): = LTGs  Skilled Therapeutic Interventions/Progress Updates:    Pt received seated on EOB with no c/o pain and agreeable to treatment. NA reported to therapist that pt got up without alerting staff; requested pt door be left half open at completion of session. Gait training as supervision as described below; three trials performed for 150-300' each trial. Stair training on 6" stairs for 2 trials of 12 stairs each; required extended seated rest break following each trial due to excessive fatigue. Standing balance activities performed with throwing ring toss alternating LE stepping to A with throwing, including reaching to pick rings off ground. Sitting balance activities performed while catching/throwing/kicking beach ball, performed with supervision. Pt ambulated back to room with supervision, min cues for navigation. Pt remained seated in recliner with all needs within reach at completion of session.   Therapy Documentation Precautions:  Precautions Precautions: Fall Restrictions Weight Bearing Restrictions: No Pain: Pain Assessment Pain Assessment: No/denies pain Pain Score: 0-No pain Locomotion : Ambulation Ambulation: Yes Ambulation/Gait Assistance: 5: Supervision Ambulation Distance (Feet): 300 Feet Assistive device: None Ambulation/Gait Assistance Details: Verbal cues for precautions/safety Ambulation/Gait Assistance Details: supervision d/t occasional mild LOB, however able to recover without assistance Gait Gait: Yes Gait Pattern: Impaired Gait Pattern: Shuffle;Poor foot clearance - right;Decreased stride length;Right foot flat Stairs / Additional Locomotion Stairs: Yes Stairs Assistance: 5:  Supervision Stairs Assistance Details: Verbal cues for precautions/safety Stairs Assistance Details (indicate cue type and reason): supervision d/t occasional poor hand placement Stair Management Technique: Two rails;Alternating pattern;Step to pattern (alternating ascent, step-to descent) Number of Stairs: 12 Height of Stairs: 6   See FIM for current functional status  Therapy/Group: Individual Therapy  Vista Lawman 06/22/2015, 12:18 PM

## 2015-06-23 ENCOUNTER — Inpatient Hospital Stay (HOSPITAL_COMMUNITY): Payer: Medicare Other

## 2015-06-23 ENCOUNTER — Inpatient Hospital Stay (HOSPITAL_COMMUNITY): Payer: Medicare Other | Admitting: Speech Pathology

## 2015-06-23 ENCOUNTER — Inpatient Hospital Stay (HOSPITAL_COMMUNITY): Payer: Medicare Other | Admitting: Occupational Therapy

## 2015-06-23 ENCOUNTER — Inpatient Hospital Stay (HOSPITAL_COMMUNITY): Payer: Medicare Other | Admitting: Physical Therapy

## 2015-06-23 DIAGNOSIS — K5909 Other constipation: Secondary | ICD-10-CM

## 2015-06-23 LAB — GLUCOSE, CAPILLARY
GLUCOSE-CAPILLARY: 145 mg/dL — AB (ref 65–99)
GLUCOSE-CAPILLARY: 182 mg/dL — AB (ref 65–99)
GLUCOSE-CAPILLARY: 115 mg/dL — AB (ref 65–99)
Glucose-Capillary: 211 mg/dL — ABNORMAL HIGH (ref 65–99)

## 2015-06-23 MED ORDER — SENNOSIDES-DOCUSATE SODIUM 8.6-50 MG PO TABS
2.0000 | ORAL_TABLET | Freq: Every day | ORAL | Status: DC
  Administered 2015-06-23 – 2015-06-25 (×3): 2 via ORAL
  Filled 2015-06-23 (×3): qty 2

## 2015-06-23 NOTE — Progress Notes (Signed)
Occupational Therapy Session Note  Patient Details  Name: Holly Sparks MRN: [id number] Date of Birth: 03-24-1941  Today's Date: 06/23/2015 OT Individual Time: 1015-1045 OT Individual Time Calculation (min): 30 min    Short Term Goals: Week 1:  OT Short Term Goal 1 (Week 1): STGs= LTGs secondary to short LOS  Skilled Therapeutic Interventions/Progress Updates:    Pt seen for OT therapy session focusing on cognitive re-training. Pt sitting in w/c upon arrival, agreeable to tx session. Max VCs and demonstration required for pt to lock w/c brakes prior to standing. Pt denied used of quad cane for ambulation and waked throughout unit with close supervision and min VCs for safe walking technique. Pt with 1 LOB episode when scissoring walking stride, requiring min A to regain balance.  Pt participated in cognitive task at table top level, required to locate items in newspaper, picking out small details from highly stimulating environment. Pt required min VCs to attend to task, and displayed moderate difficulty with word finding throughout session. Pt required to find various items throughout room and name and spell words, requiring assist for both word finding and spelling, and demonstrating min perseveration with wods. Pt returned to room at end of session, left sitting in w/c with all needs in reach. Educated regarding use of call bell when needing to use restroom.   Therapy Documentation Precautions:  Precautions Precautions: Fall Restrictions Weight Bearing Restrictions: No Pain:  Pt denied pain, however, voiced having stomach ache and stated medication had been given prior to tx session.      See FIM for current functional status  Therapy/Group: Individual Therapy  Lewis, Brooklinn Longbottom C 06/23/2015, 10:47 AM

## 2015-06-23 NOTE — Progress Notes (Addendum)
Physical Therapy Session Note  Patient Details  Name: Holly Sparks MRN: [id number] Date of Birth: 1941/10/22  Today's Date: 06/23/2015 PT Individual Time: 1105-1200 PT Individual Time Calculation (min): 55 min   Short Term Goals: Week 1:  PT Short Term Goal 1 (Week 1): = LTGs  Skilled Therapeutic Interventions/Progress Updates:  Session focused on gait training, activity tolerance, and balance. Pt received in room in wheelchair. Pt ambulated without AD 150 feet X2 requiring supervision and mod verbal cuing for arm swing. Pt performed static standing balance activities narrow base of support and modified tandem stance X 1 min trials requiring supervision to min assist and able to verbalize alphabet and counting by 5's but could not verbalize naming of animals, or counting by 7's. Pt performed side stepping with dual cognitive task of counting by 3's 20 feet X 4  towhile  with overall 50% accuracy requiring supervision to min assist. Pt reported she felt SOB, see flow sheet for HR and SpO2. After seated rest pt performed static balance on foam pad with posterior lean, progressed to multidirectional reaches to facilitate anterior weight shift 2 X 1 min . Pt returned to room and left sitting in wheelchair with all needs in reach.  Therapy Documentation Precautions:  Precautions Precautions: Fall Restrictions Weight Bearing Restrictions: No  Vital Signs: Therapy Vitals Pulse Rate: (!) 49 (after rest) Oxygen Therapy SpO2: 96 % (after rest) Pain: Pain Assessment Pain Assessment: Faces Faces Pain Scale: Hurts a little bit Pain Type: Acute pain Pain Location: Abdomen Pain Orientation: Mid Pain Descriptors / Indicators: Aching Pain Onset: On-going Pain Intervention(s): Rest  See FIM for current functional status  Therapy/Group: Individual Therapy  Ivery Quale 06/23/2015, 12:08 PM

## 2015-06-23 NOTE — Progress Notes (Signed)
Speech Language Pathology Note  Patient Details  Name: TYLYN STANKOVICH MRN: [id number] Date of Birth: [birth year] Today's Date: 06/23/2015  MBSS complete. Full report located under chart review in imaging section.  Feliberto Gottron, MA, CCC-SLP [phone]    Tonae Livolsi 06/23/2015, 3:00 PM

## 2015-06-23 NOTE — Progress Notes (Signed)
Occupational Therapy Note  Patient Details  Name: Holly Sparks MRN: [id number] Date of Birth: 28-Jun-1941  Today's Date: 06/23/2015 OT Individual Time: 1330-1400 OT Individual Time Calculation (min): 30 min   Pt denied pain Individual Therapy  Pt resting in bed upon arrival but agreeable to therapy.  Pt amb without AD to therapy gym and engaged in scavenger hunt while engaged in discussion of the days therapies and yesterday's outing.  Pt exhibited some word finding difficulties and required min A.  Pt required min A to locate all items in scavenger hunt.  Pt referred to list of items but was unable to locate last item on list without assistance.  Pt returned to room and sat EOB with alarm activated and sister present.  RN and NT notified of patient's location on EOB.   Lavone Neri [facility] 06/23/2015, 2:10 PM

## 2015-06-23 NOTE — Progress Notes (Signed)
[place] PHYSICAL MEDICINE & REHABILITATION     PROGRESS NOTE    Subjective/Complaints: Having continued nausea---perhaps a little better after bm this morning. Sitting eob about to work with OT  ROS: Pt denies fever, rash/itching, headache, blurred or double vision, nausea, vomiting, abdominal pain, diarrhea, chest pain, shortness of breath,no lightheadedness   Objective: Vital Signs: Blood pressure 116/67, pulse 65, temperature 97.9 F (36.6 C), temperature source Oral, resp. rate 18, height 5\' 6"  (1.676 m), weight 72.3 kg (159 lb 6.3 oz), SpO2 99 %. No results found. No results for input(s): WBC, HGB, HCT, PLT in the last 72 hours. No results for input(s): NA, K, CL, GLUCOSE, BUN, CREATININE, CALCIUM in the last 72 hours.  Invalid input(s): CO CBG (last 3)   Recent Labs  06/22/15 1639 06/22/15 2111 06/23/15 0655  GLUCAP 144* 202* 145*    Wt Readings from Last 3 Encounters:  06/17/15 72.3 kg (159 lb 6.3 oz)  06/13/15 71.215 kg (157 lb)  10/13/14 71.215 kg (157 lb)    Physical Exam:  Constitutional: She appears well-developed. no distress HENT: oral mucosa pink and moist. Dentition good Head: Normocephalic.  Eyes: EOM are normal.  Neck: Normal range of motion. Neck supple. No thyromegaly present.  Cardiovascular:  Cardiac rate controlled. Regular rhythm. No murmurs Respiratory: Effort normal and breath sounds normal. No respiratory distress. no rales and wheezes GI: Soft. Bowel sounds are normal. She exhibits no distension. No tenderness Neurological: She is alert.   Gradual improvement in language/apraxia---still present. RUE: 4+ delt, bicep, tricep, wrist and hi. LUE: 5/5 prox to distal. Right pronator drift resolved. RLE: 4/5 hf, 4 ke and 4/5 ankle. LLE 4+/5 prox to 5/5 distal. Senses pain and LT in all 4's. Mild right central 7 and tongue deviation. DTR s 1+.  Skin: Skin is warm and dry.  Psychiatric: She has a normal mood and affect. Her behavior is  normal   Assessment/Plan: 1. Functional deficits secondary to left MCA infarct which require 3+ hours per day of interdisciplinary therapy in a comprehensive inpatient rehab setting. Physiatrist is providing close team supervision and 24 hour management of active medical problems listed below. Physiatrist and rehab team continue to assess barriers to discharge/monitor patient progress toward functional and medical goals. FIM: FIM - Bathing Bathing Steps Patient Completed: Chest, Right Arm, Left Arm, Abdomen Bathing: 5: Set-up assist to: Obtain items (pt declined LB bathing)  FIM - Upper Body Dressing/Undressing Upper body dressing/undressing steps patient completed: Thread/unthread right sleeve of pullover shirt/dresss, Thread/unthread left sleeve of pullover shirt/dress, Put head through opening of pull over shirt/dress, Pull shirt over trunk Upper body dressing/undressing: 5: Supervision: Safety issues/verbal cues FIM - Lower Body Dressing/Undressing Lower body dressing/undressing steps patient completed: Thread/unthread right underwear leg, Thread/unthread left underwear leg, Pull underwear up/down, Thread/unthread right pants leg, Thread/unthread left pants leg, Pull pants up/down, Fasten/unfasten pants, Don/Doff right sock, Don/Doff left sock, Don/Doff left shoe, Don/Doff right shoe, Fasten/unfasten right shoe, Fasten/unfasten left shoe Lower body dressing/undressing: 4: Steadying Assist  FIM - Toileting Toileting steps completed by patient: Adjust clothing prior to toileting, Performs perineal hygiene, Adjust clothing after toileting Toileting Assistive Devices: Grab bar or rail for support Toileting: 5: Supervision: Safety issues/verbal cues  FIM - Diplomatic Services operational officer Devices: Grab bars Toilet Transfers: 5-To toilet/BSC: Supervision (verbal cues/safety issues), 5-From toilet/BSC: Supervision (verbal cues/safety issues)  FIM - Event organiser Devices: Arm rests Bed/Chair Transfer: 5: Bed > Chair or W/C: Supervision (verbal cues/safety  issues), 5: Chair or W/C > Bed: Supervision (verbal cues/safety issues), 5: Sit > Supine: Supervision (verbal cues/safety issues)  FIM - Locomotion: Wheelchair Locomotion: Wheelchair: 0: Activity did not occur FIM - Locomotion: Ambulation Locomotion: Ambulation Assistive Devices: Other (comment) (no AD) Ambulation/Gait Assistance: 5: Supervision Locomotion: Ambulation: 5: Travels 150 ft or more with supervision/safety issues  Comprehension Comprehension Mode: Auditory Comprehension: 3-Understands basic 50 - 74% of the time/requires cueing 25 - 50%  of the time  Expression Expression Mode: Verbal Expression: 3-Expresses basic 50 - 74% of the time/requires cueing 25 - 50% of the time. Needs to repeat parts of sentences.  Social Interaction Social Interaction: 5-Interacts appropriately 90% of the time - Needs monitoring or encouragement for participation or interaction.  Problem Solving Problem Solving: 4-Solves basic 75 - 89% of the time/requires cueing 10 - 24% of the time  Memory Memory: 5-Recognizes or recalls 90% of the time/requires cueing < 10% of the time  Medical Problem List and Plan: 1. Functional deficits secondary to cardioembolic left MCA infarct--continue therapies 2. DVT Prophylaxis/Anticoagulation: Eliquis. continue 3. Pain Management: Ultram as needed for breakthrough pain--pain under control 4. Mood/anxiety: Ativan 0.5 mg twice daily as needed. Improved confidence 5. Neuropsych: This patient is capable of making decisions on her own behalf. 6. Skin/Wound Care: Routine skin checks. Oob.  7. Fluids/Electrolytes/Nutrition:continue potassium supplement. Encourage fluids.  8. Dysphagia. Dysphagia #3 nectar thick liquids. Eating 50-75% currently 9. Hypertension/atrial fibrillation.  HR on low side write parameters  -12.5mg  lopressor, lisinopril 20 mg daily,    -hydrochlorothiazide  reduced to 12.5mg  to avoid hypotension 10. Diabetes mellitus of peripheral neuropathy. Hemoglobin A1c 11.8. Marland Kitchen Check blood sugars before meals and at bedtime.  -adjusted lantus to 20 u qhs--observe for response today 11. Nausea: likely due to constipation. Had bm this morning  -aggressive bowel regimen  LOS (Days) 7 A FACE TO FACE EVALUATION WAS PERFORMED  SWARTZ,ZACHARY T 06/23/2015 8:30 AM

## 2015-06-23 NOTE — Progress Notes (Signed)
Occupational Therapy Session Note  Patient Details  Name: Holly Sparks MRN: [id number] Date of Birth: 06-01-1941  Today's Date: 06/23/2015 OT Individual Time:0700 -0800 OT Individual Time Calculation (min): 60 min    Short Term Goals: Week 1:  OT Short Term Goal 1 (Week 1): STGs= LTGs secondary to short LOS  Skilled Therapeutic Interventions/Progress Updates:    Pt seen for 1:1 OT session with a focus on ADL retraining, functional mobility, dynamic standing balance, cognitive dual task, word finding, activity tolerance, and safety awareness. Pt received in bathroom with NT present. Pt expressing stomach pain/discomfort throughout session and pt discussed with MD. Pt declined shower this session preferring to wash UB at sink and complete grooming. Pt required mod-max cues to word find during session but able to express needed functional item with mod phonemic cues.Pt completed functional ambulation to/from therapy gym apprx 100' with close supervision and increased time due to slow gait speed. Pt engaged in cognitive dual task incorporating ball toss with trampoline kickback and category naming of food items. Noted mod-max difficulty with naming new items with perseveration on "apple" and "banana." Pt required frequent rest breaks throughout ambulation and balance activity due to decreased endurance. Pt required mod cues for safety throughout session during transitional movements and functional ambulation.  Pt left seated in w/c with breakfast present and all other needs within reach.   Therapy Documentation Precautions:  Precautions Precautions: Fall Restrictions Weight Bearing Restrictions: No General:   Vital Signs: Therapy Vitals Temp: 97.9 F (36.6 C) Temp Source: Oral Pulse Rate: 65 Resp: 18 BP: 116/67 mmHg Patient Position (if appropriate): Sitting Oxygen Therapy SpO2: 99 % O2 Device: Not Delivered Pain:   ADL:   Exercises:   Other Treatments:    See FIM for  current functional status  Therapy/Group: Individual Therapy  Alger Memos 06/23/2015, 7:12 AM

## 2015-06-24 ENCOUNTER — Inpatient Hospital Stay (HOSPITAL_COMMUNITY): Payer: Medicare Other | Admitting: Physical Therapy

## 2015-06-24 ENCOUNTER — Inpatient Hospital Stay (HOSPITAL_COMMUNITY): Payer: Medicare Other

## 2015-06-24 ENCOUNTER — Inpatient Hospital Stay (HOSPITAL_COMMUNITY): Payer: Medicare Other | Admitting: Speech Pathology

## 2015-06-24 DIAGNOSIS — K5900 Constipation, unspecified: Secondary | ICD-10-CM

## 2015-06-24 LAB — CBC
HCT: 43.4 % (ref 36.0–46.0)
Hemoglobin: 14.8 g/dL (ref 12.0–15.0)
MCH: 29.5 pg (ref 26.0–34.0)
MCHC: 34.1 g/dL (ref 30.0–36.0)
MCV: 86.5 fL (ref 78.0–100.0)
Platelets: 202 10*3/uL (ref 150–400)
RBC: 5.02 MIL/uL (ref 3.87–5.11)
RDW: 13.4 % (ref 11.5–15.5)
WBC: 8.4 10*3/uL (ref 4.0–10.5)

## 2015-06-24 LAB — GLUCOSE, CAPILLARY
GLUCOSE-CAPILLARY: 129 mg/dL — AB (ref 65–99)
Glucose-Capillary: 124 mg/dL — ABNORMAL HIGH (ref 65–99)
Glucose-Capillary: 175 mg/dL — ABNORMAL HIGH (ref 65–99)
Glucose-Capillary: 120 mg/dL — ABNORMAL HIGH (ref 65–99)

## 2015-06-24 LAB — BASIC METABOLIC PANEL
ANION GAP: 10 (ref 5–15)
BUN: 28 mg/dL — AB (ref 6–20)
CALCIUM: 9.5 mg/dL (ref 8.9–10.3)
CHLORIDE: 98 mmol/L — AB (ref 101–111)
CO2: 28 mmol/L (ref 22–32)
CREATININE: 1.49 mg/dL — AB (ref 0.44–1.00)
GFR calc Af Amer: 39 mL/min — ABNORMAL LOW (ref >60)
GFR calc non Af Amer: 34 mL/min — ABNORMAL LOW (ref >60)
Glucose, Bld: 189 mg/dL — ABNORMAL HIGH (ref 65–99)
Potassium: 3.8 mmol/L (ref 3.5–5.1)
Sodium: 136 mmol/L (ref 135–145)

## 2015-06-24 LAB — TROPONIN I

## 2015-06-24 LAB — CK TOTAL AND CKMB (NOT AT ARMC)
CK TOTAL: 30 U/L — AB (ref 38–234)
CK, MB: 1 ng/mL (ref 0.5–5.0)
Relative Index: INVALID (ref 0.0–2.5)

## 2015-06-24 NOTE — Progress Notes (Signed)
Patient not feeling well this morning with some intermittent nausea and loose stools. Return from therapies feeling somewhat short of breath oxygen saturations in the mid 80s. She had some mild sternal chest pain that resolved. Heart rate was variable from 56 -116. She has been seen by [facility] cardiology in the past for arrhythmia atrial fibrillation and remains on ELIQUIS. At this time will request cardiology consult. Chest x-ray EKG and cardiac enzymes have been ordered. Morning chemistries unremarkable. All issues discussed with patient and husband

## 2015-06-24 NOTE — Progress Notes (Signed)
Physical Therapy Session Note  Patient Details  Name: AVILYN VIRTUE MRN: [id number] Date of Birth: [birth year]  Today's Date: 06/24/2015 PT Individual Time: 1500-1530 PT Individual Time Calculation (min): 30 min   Short Term Goals: Week 1:  PT Short Term Goal 1 (Week 1): = LTGs  Skilled Therapeutic Interventions/Progress Updates:  Session focused on activity tolerance and balance. Discussed with PA who recommended no change in treatment plan following earlier episode of SOB. Pt received in bed and reports "still not feeling good". Pt performed sit to stand and walked across room to wheelchair with min assist. Pt required min assist to stand on Wii balance board and supervision to min assist while on it for less than 30 sec then reported "still not feeling good". Pt sat edge of mat and  vitals were checked and were within normal limits. Pt declined further treatment reporting that "I cant do anymore." Pt returned to room and left in bed with all needs in reach and family present.  Therapy Documentation Precautions:  Precautions Precautions: Fall Restrictions Weight Bearing Restrictions: No    See FIM for current functional status  Therapy/Group: Individual Therapy  Ivery Quale 06/24/2015, 3:32 PM

## 2015-06-24 NOTE — Progress Notes (Signed)
Physical Therapy Session Note  Patient Details  Name: Holly Sparks MRN: [id number] Date of Birth: 03/17/1941  Today's Date: 06/24/2015 PT Individual Time: 1000-1055 PT Individual Time Calculation (min): 55 min   Short Term Goals: Week 1:  PT Short Term Goal 1 (Week 1): = LTGs  Skilled Therapeutic Interventions/Progress Updates:   Session focused on family training with patient and patient's spouse with focus on cognition and safety with functional mobility. Patient performed path finding task with focus on utilization/interpretation of external aides (signs) from rehab unit to gift shop with supervision and min-mod questioning cues with more than reasonable amount of time. Patient c/o pain in chest and SOB that did not improve with seated rest. Patient transported back to rehab unit and RN notified. Vitals assessed, see below. RN notified PA and 2 L 02 via [place] applied, vitals reassessed and WFL. Discussed with patient and spouse recommendations for 24/7 supervision and staying within arm's length in order to assist patient as needed, providing patient extra time for processing, and energy conservation techniques. Patient and spouse verbalized understanding. Patient left sitting in arm chair with spouse present.   Therapy Documentation Precautions:  Precautions Precautions: Fall Restrictions Weight Bearing Restrictions: No Vital Signs: Therapy Vitals Pulse Rate: (!) 101 BP: 107/77 mmHg Patient Position (if appropriate): Sitting Oxygen Therapy SpO2: (!) 80 % (RN notified) O2 Device: Not Delivered Pain: Pain Assessment Pain Assessment: Faces Faces Pain Scale: Hurts little more Pain Type: Acute pain Pain Location: Chest Pain Orientation: Anterior;Upper Pain Onset: With Activity Pain Intervention(s): RN made aware;Rest (PA notified) Locomotion : Ambulation Ambulation/Gait Assistance: 5: Supervision   See FIM for current functional status  Therapy/Group: Individual  Therapy  Kerney Elbe 06/24/2015, 12:21 PM

## 2015-06-24 NOTE — Progress Notes (Signed)
Occupational Therapy Session Note  Patient Details  Name: Holly Sparks MRN: [id number] Date of Birth: Oct 14, 1941  Today's Date: 06/24/2015 OT Individual Time: 0700-0800 OT Individual Time Calculation (min): 60 min    Short Term Goals: Week 1:  OT Short Term Goal 1 (Week 1): STGs= LTGs secondary to short LOS  Skilled Therapeutic Interventions/Progress Updates:    Pt seen for 1:1 OT session with a focus on ADL retraining, functional mobility, dynamic standing balance, cognitive remediation, word finding, activity tolerance, and safety awareness. Pt received seated on EOB with complaints of stomach pain, but agreeable to complete ADL session. Pt ambulated throughout room to retrieve clothing at supervision level. Pt completed toilet transfer, toileting, and shower transfer all at supervision. Pt completed bathing sit<>stand with supervision for pt safety. Pt completed grooming in standing for 4 min and 6 min with intermittent rest break due to decreased endurance. Pt ambulated to therapy gym apprx 100' to retrieve foam bad and back to day room 80' while holding object at steadying A-supervision. Noted unsteadiness with directional turn changes requiring steadying A. Pt required mod cues for increased gait speed and reciprocal arm swing. Pt engaged in cognitive dual task on foam pad while naming functional items with 5/6 accuracy and phonemic cue for 6th object. Pt completed 3 stands on foam pad with steadying A-close supervision. Pt ambulated back to room at supervision level. Pt left seated in recliner with breakfast present and NT present.   Therapy Documentation Precautions:  Precautions Precautions: Fall Restrictions Weight Bearing Restrictions: No General:   Vital Signs: Therapy Vitals Temp: 97.7 F (36.5 C) Temp Source: Oral Pulse Rate: 61 Resp: 18 BP: 104/65 mmHg Patient Position (if appropriate): Lying Oxygen Therapy SpO2: 100 % O2 Device: Not Delivered Pain:   ADL:    Exercises:   Other Treatments:    See FIM for current functional status  Therapy/Group: Individual Therapy  Alger Memos 06/24/2015, 7:08 AM

## 2015-06-24 NOTE — Progress Notes (Signed)
Pt was with PT and began to complain of chest pain and shortness of breath. O2 sats assessed and pt was in the low 80's. 2L applied to the pt and sats returned to 92%. EKG, Chest-xray ,and cardiac enzymes ordered per Jesusita Oka (PA). Pts chest pain resolved and vitals remain stable. Cardiology also consulted. Will continue to monitor pt.

## 2015-06-24 NOTE — Progress Notes (Signed)
Speech Language Pathology Daily Session Note  Patient Details  Name: Holly Sparks MRN: [id number] Date of Birth: 10-01-1941  Today's Date: 06/24/2015 SLP Individual Time: 1100-1115 SLP Individual Time Calculation (min): 15 min and Today's Date: 06/24/2015 SLP Missed Time: 45 Minutes Missed Time Reason: Pain;Patient fatigue;Patient ill (Comment);Nursing care  Short Term Goals: Week 1: SLP Short Term Goal 1 (Week 1): Patient will sustain attention to functional and familiar tasks for 10 minutes with min A verbal cues for redirection.  SLP Short Term Goal 2 (Week 1): Patient will self-monitor verbal errors with Mod A question and verbal cues.  SLP Short Term Goal 3 (Week 1): Patient will follow 2 step commands with 75% accuracy and Mod A multimodal cues.  SLP Short Term Goal 4 (Week 1): Patient will name functional items with 75% accuracy with Min A multimodal cues.  SLP Short Term Goal 5 (Week 1): Patient will match a written word to an item from a field of 2 with 75% accuracy and Min A multimodal cues.  SLP Short Term Goal 6 (Week 1): Patient will consume trials of thin liquids without overt s/s of aspiration with supervision cues over 3 conescutive sessions to assess readiness for repeat MBS.   Skilled Therapeutic Interventions: Skilled treatment session focused on functional communication. Upon arrival, patient was sitting upright in wheelchair with a grimace on her face. Patient requested to transfer to the bed and reported due to chest pain upon questioning. RN and PA made aware. Patient's O2 saturations were WFL and patient's HR ranged from 52-116 BPM. Session ended 45 minutes early due to EKG and chest X-ray.  Patient left with RN and NT in room. Continue with current plan of care.     Pain Chest Pain, Unable to rate. RN made aware and patient transferred back to bed.   Therapy/Group: Individual Therapy  Jakyri Brunkhorst 06/24/2015, 4:47 PM

## 2015-06-24 NOTE — Progress Notes (Signed)
[facility]     PROGRESS NOTE    Subjective/Complaints: Stomach feels upset this morning. States she needs to have a bm---no bm recorded for a few days in chart. Pretty sure she had one yesterady  ROS: Pt denies fever, rash/itching, headache, blurred or double vision, nausea, vomiting, abdominal pain, diarrhea, chest pain, shortness of breath,no lightheadedness   Objective: Vital Signs: Blood pressure 104/65, pulse 61, temperature 97.7 F (36.5 C), temperature source Oral, resp. rate 18, height 5\' 6"  (1.676 m), weight 72.53 kg (159 lb 14.4 oz), SpO2 100 %. Dg Swallowing Func-speech Pathology  06/23/2015    Objective Swallowing Evaluation:    Patient Details  Name: ANJANNETTE GAUGER MRN: [id number] Date of Birth: 1941-07-26  Today's Date: 06/23/2015 Time: SLP Start Time (ACUTE ONLY): 0900-SLP Stop Time (ACUTE ONLY): 0930 SLP Time Calculation (min) (ACUTE ONLY): 50 min  Past Medical History:  Past Medical History  Diagnosis Date  . Hypertension     x 10 yrs  . 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 High cholesterol   . Presence of permanent cardiac pacemaker   . Paroxysmal a-fib: Remote hx of afib 06/15/2015   Past Surgical History:  Past Surgical History  Procedure Laterality Date  . Neck surgery      x 2 for spur and disc problems  . Colonoscopy    . Colonoscopy N/A 05/08/2013    Procedure: COLONOSCOPY;  Surgeon: Malissa Hippo, MD;  Location: [facility];  Service: Endoscopy;  Laterality: N/A;  1015   HPI:  Other Pertinent Information: Patient is a 74 y.o. female, with  hypertension, hyperlipidemia, diabetes and recently an irregular  heartbeat, presented to ER with difficulty speaking. Diagnosed with left  MCA CVA.   MBS completed 7/18 and recommended Dys. 3 textures with  nectar-thick liquids. Patient transferred to CIR on 7/19 and has been  participating in dysphagia treatment. Repeat MBS today to assess for  possible upgrade.   No Data Recorded  Assessment / Plan /  Recommendation CHL IP CLINICAL IMPRESSIONS 06/23/2015  Therapy Diagnosis Mild pharyngeal phase dysphagia  Clinical Impression Patient presents with a mild sensory motor based  oropharyngeal dysphagia. Patient with a mildly delayed swallow initiation  and decreased laryngeal closure resulting in silent penetration of thin  liquids. Cueing for chin tuck with straw reduced penetration episodes,  however,  patient required moderate cues for completion of chin tuck. Mild  pharyngeal residuals noted post swallow may be due to oro-pharyngeal  weakness and/or presence of cervical hardware at C3-5. Residuals cleared  with cued dry swallows and did not significantly impact overall function.  Given communication and cognitive deficits impacting carryover of newly  learned information, recommend continuing use of nectar-thick liquids and  initiation of the water protocol.       CHL IP TREATMENT RECOMMENDATION 06/23/2015  Treatment Recommendations F/u OP SLP     CHL IP DIET RECOMMENDATION 06/23/2015  SLP Diet Recommendations Dysphagia 3 (Mech soft);Nectar  Liquid Administration via (None)  Medication Administration Whole meds with puree  Compensations Slow rate;Small sips/bites  Postural Changes and/or Swallow Maneuvers (None)     CHL IP OTHER RECOMMENDATIONS 06/23/2015  Recommended Consults (None)  Oral Care Recommendations (None)  Other Recommendations Order thickener from pharmacy;Prohibited food  (jello, ice cream, thin soups);Remove water pitcher     CHL IP FOLLOW UP RECOMMENDATIONS 06/16/2015  Follow up Recommendations Inpatient Rehab     CHL IP FREQUENCY AND  DURATION 06/15/2015  Speech Therapy Frequency (ACUTE ONLY) min 2x/week  Treatment Duration (None)     Pertinent Vitals/Pain N/A    SLP Swallow Goals No flowsheet data found.  No flowsheet data found.    CHL IP REASON FOR REFERRAL 06/23/2015  Reason for Referral Objectively evaluate swallowing function     CHL IP ORAL PHASE 06/23/2015  Lips (None)  Tongue (None)  Mucous  membranes (None)  Nutritional status (None)  Other (None)  Oxygen therapy (None)  Oral Phase WFL  Oral - Pudding Teaspoon (None)  Oral - Pudding Cup (None)  Oral - Honey Teaspoon (None)  Oral - Honey Cup (None)  Oral - Honey Syringe (None)  Oral - Nectar Teaspoon (None)  Oral - Nectar Cup (None)  Oral - Nectar Straw (None)  Oral - Nectar Syringe (None)  Oral - Ice Chips (None)  Oral - Thin Teaspoon (None)  Oral - Thin Cup (None)  Oral - Thin Straw (None)  Oral - Thin Syringe (None)  Oral - Puree (None)  Oral - Mechanical Soft (None)  Oral - Regular (None)  Oral - Multi-consistency (None)  Oral - Pill (None)  Oral Phase - Comment (None)      CHL IP PHARYNGEAL PHASE 06/23/2015  Pharyngeal Phase Impaired  Pharyngeal - Pudding Teaspoon (None)  Penetration/Aspiration details (pudding teaspoon) (None)  Pharyngeal - Pudding Cup (None)  Penetration/Aspiration details (pudding cup) (None)  Pharyngeal - Honey Teaspoon (None)  Penetration/Aspiration details (honey teaspoon) (None)  Pharyngeal - Honey Cup (None)  Penetration/Aspiration details (honey cup) (None)  Pharyngeal - Honey Syringe (None)  Penetration/Aspiration details (honey syringe) (None)  Pharyngeal - Nectar Teaspoon (None)  Penetration/Aspiration details (nectar teaspoon) (None)  Pharyngeal - Nectar Cup (None)  Penetration/Aspiration details (nectar cup) (None)  Pharyngeal - Nectar Straw (None)  Penetration/Aspiration details (nectar straw) (None)  Pharyngeal - Nectar Syringe (None)  Penetration/Aspiration details (nectar syringe) (None)  Pharyngeal - Ice Chips (None)  Penetration/Aspiration details (ice chips) (None)  Pharyngeal - Thin Teaspoon (None)  Penetration/Aspiration details (thin teaspoon) (None)  Pharyngeal - Thin Cup (None)  Penetration/Aspiration details (thin cup) (None)  Pharyngeal - Thin Straw (None)  Penetration/Aspiration details (thin straw) (None)  Pharyngeal - Thin Syringe (None)  Penetration/Aspiration details (thin syringe') (None)   Pharyngeal - Puree (None)  Penetration/Aspiration details (puree) (None)  Pharyngeal - Mechanical Soft (None)  Penetration/Aspiration details (mechanical soft) (None)  Pharyngeal - Regular (None)  Penetration/Aspiration details (regular) (None)  Pharyngeal - Multi-consistency (None)  Penetration/Aspiration details (multi-consistency) (None)  Pharyngeal - Pill (None)  Penetration/Aspiration details (pill) (None)  Pharyngeal Comment (None)      CHL IP CERVICAL ESOPHAGEAL PHASE 06/23/2015  Cervical Esophageal Phase WFL  Pudding Teaspoon (None)  Pudding Cup (None)  Honey Teaspoon (None)  Honey Cup (None)  Honey Straw (None)  Nectar Teaspoon (None)  Nectar Cup (None)  Nectar Straw (None)  Nectar Sippy Cup (None)  Thin Teaspoon (None)  Thin Cup (None)  Thin Straw (None)  Thin Sippy Cup (None)  Cervical Esophageal Comment (None)    No flowsheet data found.         PAYNE, COURTNEY 06/23/2015, 4:18 PM    No results for input(s): WBC, HGB, HCT, PLT in the last 72 hours. No results for input(s): NA, K, CL, GLUCOSE, BUN, CREATININE, CALCIUM in the last 72 hours.  Invalid input(s): CO CBG (last 3)   Recent Labs  06/23/15 1626 06/23/15 2056 06/24/15 0650  GLUCAP 115* 211* 124*    Wt Readings from Last  3 Encounters:  06/24/15 72.53 kg (159 lb 14.4 oz)  06/13/15 71.215 kg (157 lb)  10/13/14 71.215 kg (157 lb)    Physical Exam:  Constitutional: She appears well-developed. no distress HENT: oral mucosa pink and moist. Dentition good Head: Normocephalic.  Eyes: EOM are normal.  Neck: Normal range of motion. Neck supple. No thyromegaly present.  Cardiovascular:  Cardiac rate controlled. Regular rhythm. No murmurs Respiratory: Effort normal and breath sounds normal. No respiratory distress. no rales and wheezes GI: Soft. Bowel sounds are normal. She exhibits no distension. No tenderness Neurological: She is alert.   Gradual improvement in language/apraxia---still present. RUE: 4+ delt, bicep,  tricep, wrist and hi. LUE: 5/5 prox to distal. Right pronator drift resolved. RLE: 4/5 hf, 4 ke and 4/5 ankle. LLE 4+/5 prox to 5/5 distal. Senses pain and LT in all 4's. Mild right central 7 and tongue deviation. DTR s 1+.  Skin: Skin is warm and dry.  Psychiatric: She has a normal mood and affect. Her behavior is normal   Assessment/Plan: 1. Functional deficits secondary to left MCA infarct which require 3+ hours per day of interdisciplinary therapy in a comprehensive inpatient rehab setting. Physiatrist is providing close team supervision and 24 hour management of active medical problems listed below. Physiatrist and rehab team continue to assess barriers to discharge/monitor patient progress toward functional and medical goals. FIM: FIM - Bathing Bathing Steps Patient Completed: Right Arm, Left Arm, Abdomen, Front perineal area, Buttocks, Right upper leg, Left upper leg, Chest, Right lower leg (including foot), Left lower leg (including foot) Bathing: 5: Supervision: Safety issues/verbal cues  FIM - Upper Body Dressing/Undressing Upper body dressing/undressing steps patient completed: Thread/unthread right sleeve of pullover shirt/dresss, Thread/unthread left sleeve of pullover shirt/dress, Put head through opening of pull over shirt/dress, Pull shirt over trunk Upper body dressing/undressing: 5: Supervision: Safety issues/verbal cues FIM - Lower Body Dressing/Undressing Lower body dressing/undressing steps patient completed: Thread/unthread right underwear leg, Thread/unthread left underwear leg, Pull underwear up/down, Thread/unthread right pants leg, Thread/unthread left pants leg, Pull pants up/down, Fasten/unfasten pants, Don/Doff right sock, Don/Doff left sock, Don/Doff left shoe, Don/Doff right shoe, Fasten/unfasten right shoe, Fasten/unfasten left shoe Lower body dressing/undressing: 4: Steadying Assist  FIM - Toileting Toileting steps completed by patient: Adjust clothing prior  to toileting, Performs perineal hygiene, Adjust clothing after toileting Toileting Assistive Devices: Grab bar or rail for support Toileting: 5: Supervision: Safety issues/verbal cues  FIM - Diplomatic Services operational officer Devices: Grab bars Toilet Transfers: 5-To toilet/BSC: Supervision (verbal cues/safety issues), 5-From toilet/BSC: Supervision (verbal cues/safety issues)  FIM - Banker Devices: Arm rests Bed/Chair Transfer: 5: Bed > Chair or W/C: Supervision (verbal cues/safety issues), 5: Chair or W/C > Bed: Supervision (verbal cues/safety issues)  FIM - Locomotion: Wheelchair Locomotion: Wheelchair: 0: Activity did not occur FIM - Locomotion: Ambulation Locomotion: Ambulation Assistive Devices: Other (comment) (no AD) Ambulation/Gait Assistance: 5: Supervision Locomotion: Ambulation: 5: Travels 150 ft or more with supervision/safety issues  Comprehension Comprehension Mode: Auditory Comprehension: 4-Understands basic 75 - 89% of the time/requires cueing 10 - 24% of the time  Expression Expression Mode: Verbal Expression: 3-Expresses basic 50 - 74% of the time/requires cueing 25 - 50% of the time. Needs to repeat parts of sentences.  Social Interaction Social Interaction: 5-Interacts appropriately 90% of the time - Needs monitoring or encouragement for participation or interaction.  Problem Solving Problem Solving: 4-Solves basic 75 - 89% of the time/requires cueing 10 - 24% of the time  Memory Memory: 4-Recognizes or recalls 75 - 89% of the time/requires cueing 10 - 24% of the time  Medical Problem List and Plan: 1. 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 DVT Prophylaxis/Anticoagulation: Eliquis. No new complications 3. Pain Management: Ultram as needed for breakthrough pain--pain under control 4. Mood/anxiety: Ativan 0.5 mg twice daily as  needed. Periodic anxiety still evident 5. Neuropsych: This patient is capable of making decisions on her own behalf. 6. Skin/Wound Care: Routine skin checks. Oob.  7. Fluids/Electrolytes/Nutrition:continue potassium supplement. Encourage fluids.  8. Dysphagia. Dysphagia #3 nectar thick liquids. Diet not upgraded after MBS 9. Hypertension/atrial fibrillation.  HR on low side write parameters  -12.5mg  lopressor, lisinopril 20 mg daily,   -continue hctz at reduced dose---check labs today 10. Diabetes mellitus of peripheral neuropathy. Hemoglobin A1c 11.8. Marland Kitchen Check blood sugars before meals and at bedtime.  -adjusted lantus to 20 u qhs with some improvement---no changes today 11. Nausea: likely due to constipation. Had bm yesterday?  -continue aggressive bowel regimen  -check lab work this am.  -consider KUB  LOS (Days) 8 A FACE TO FACE EVALUATION WAS PERFORMED  Mahmoud Blazejewski T 06/24/2015 8:10 AM

## 2015-06-25 ENCOUNTER — Inpatient Hospital Stay (HOSPITAL_COMMUNITY): Payer: Medicare Other | Admitting: Occupational Therapy

## 2015-06-25 ENCOUNTER — Inpatient Hospital Stay (HOSPITAL_COMMUNITY): Payer: Medicare Other | Admitting: Physical Therapy

## 2015-06-25 ENCOUNTER — Inpatient Hospital Stay (HOSPITAL_COMMUNITY): Payer: Medicare Other | Admitting: Speech Pathology

## 2015-06-25 ENCOUNTER — Inpatient Hospital Stay (HOSPITAL_COMMUNITY): Payer: Medicare Other

## 2015-06-25 DIAGNOSIS — F411 Generalized anxiety disorder: Secondary | ICD-10-CM

## 2015-06-25 LAB — GLUCOSE, CAPILLARY
GLUCOSE-CAPILLARY: 106 mg/dL — AB (ref 65–99)
Glucose-Capillary: 141 mg/dL — ABNORMAL HIGH (ref 65–99)
Glucose-Capillary: 145 mg/dL — ABNORMAL HIGH (ref 65–99)
Glucose-Capillary: 163 mg/dL — ABNORMAL HIGH (ref 65–99)

## 2015-06-25 MED ORDER — PANTOPRAZOLE SODIUM 40 MG PO TBEC
40.0000 mg | DELAYED_RELEASE_TABLET | Freq: Two times a day (BID) | ORAL | Status: DC
  Administered 2015-06-25 – 2015-06-27 (×4): 40 mg via ORAL
  Filled 2015-06-25 (×4): qty 1

## 2015-06-25 NOTE — Progress Notes (Signed)
Speech Language Pathology Daily Session Note  Patient Details  Name: Holly Sparks MRN: [id number] Date of Birth: Nov 04, 1941  Today's Date: 06/25/2015 SLP Individual Time: 0800-0900 SLP Individual Time Calculation (min): 60 min  Short Term Goals: Week 1: SLP Short Term Goal 1 (Week 1): Patient will sustain attention to functional and familiar tasks for 10 minutes with min A verbal cues for redirection.  SLP Short Term Goal 2 (Week 1): Patient will self-monitor verbal errors with Mod A question and verbal cues.  SLP Short Term Goal 3 (Week 1): Patient will follow 2 step commands with 75% accuracy and Mod A multimodal cues.  SLP Short Term Goal 4 (Week 1): Patient will name functional items with 75% accuracy with Min A multimodal cues.  SLP Short Term Goal 5 (Week 1): Patient will match a written word to an item from a field of 2 with 75% accuracy and Min A multimodal cues.  SLP Short Term Goal 6 (Week 1): Patient will consume trials of thin liquids without overt s/s of aspiration with supervision cues over 3 conescutive sessions to assess readiness for repeat MBS.   Skilled Therapeutic Interventions: Skilled treatment session focused on dysphagia and cognitive-linguistic goals. Upon arrival, patient was sitting EOB and reported stomach discomfort with diarrhea, RN aware. SLP facilitated session by providing trials of thin liquids via straw with use of a chin tuck. Patient demonstrated overt cough and throat clear X 1 and required Mod verbal cues for appropriate usage throughout trials, therefore, recommend patient continue nectar-thick liquids. Patient declined breakfast tray but consumed minimal intake of Dys. 3 textures (crackers and muffin) without overt s/s of aspiration.  Patient also participated in basic and functional conversation in regards to her wants/needs with Min-Mod A verbal and visual cues for verbal expression and auditory comprehension.  Patient was left upright in chair with  all needs within reach. Continue with current plan of care.    FIM:  Comprehension Comprehension Mode: Auditory Comprehension: 4-Understands basic 75 - 89% of the time/requires cueing 10 - 24% of the time Expression Expression Mode: Verbal Expression: 3-Expresses basic 50 - 74% of the time/requires cueing 25 - 50% of the time. Needs to repeat parts of sentences. Social Interaction Social Interaction: 5-Interacts appropriately 90% of the time - Needs monitoring or encouragement for participation or interaction. Problem Solving Problem Solving: 4-Solves basic 75 - 89% of the time/requires cueing 10 - 24% of the time Memory Memory: 4-Recognizes or recalls 75 - 89% of the time/requires cueing 10 - 24% of the time FIM - Eating Eating Activity: 5: Supervision/cues  Pain Pain Assessment Pain Assessment: 0-10 Pain Score: 0-No pain Pain Type: Acute pain Pain Location: Abdomen Pain Orientation: Mid;Lower Pain Onset: Unable to tell Pain Intervention(s): Medication (See eMAR)  Therapy/Group: Individual Therapy  Holly Sparks 06/25/2015, 3:57 PM

## 2015-06-25 NOTE — Progress Notes (Signed)
Social Work Patient ID: Holly Sparks, female   DOB: [birth year], 74 y.o.   MRN: [id number]   Per discussions with MD and therapies, have agreed that we need to change pt's d/c date to 7/30 as she continues with GI complaints and have not been able to complete family education with husband.  Have discussed with pt, husband and pt's sister and all agreed to change in d/c to Sat 7/30.  Husband to be here in the morning to complete family ed.  Claudell Rhody, LCSW

## 2015-06-25 NOTE — Discharge Summary (Signed)
Discharge summary job [phone]

## 2015-06-25 NOTE — Progress Notes (Addendum)
Physical Therapy Session Note  Patient Details  Name: Holly Sparks MRN: [id number] Date of Birth: [birth year]  Today's Date: 06/25/2015 PT Individual Time: 1100-1200 PT Individual Time Calculation (min): 60 min   Short Term Goals: Week 1:  PT Short Term Goal 1 (Week 1): = LTGs  Skilled Therapeutic Interventions/Progress Updates:  Session focused on balance training. Pt received sitting in chair reporting "I'm still not feeling well and I have diarrhea." Berg Balance Scale improved to score of 38/56 from 26/56 on 06/17/15 which still suggests pt is at an increased risk for falling. Pt ambulated 150 feet requiring supervision and demonstrating decreased velocity. Pt performed dynamic ambulation with head turns, and change in velocities requiring supervision for 75 feet. Pt declined additional ambulation and performed standing reaches to facilitate forward trunk and weight shift outside of BOS. Reviewed current D/C plan with patient and pt was left seated in chair with all needs in reach.  Therapy Documentation Precautions:  Precautions Precautions: Fall Restrictions Weight Bearing Restrictions: No  Pain: Pain Assessment Pain Assessment: No/denies pain  Balance: Standardized Balance Assessment Standardized Balance Assessment: Berg Balance Test Berg Balance Test Sit to Stand: Able to stand  independently using hands Standing Unsupported: Able to stand safely 2 minutes Sitting with Back Unsupported but Feet Supported on Floor or Stool: Able to sit safely and securely 2 minutes Stand to Sit: Controls descent by using hands Transfers: Able to transfer safely, definite need of hands Standing Unsupported with Eyes Closed: Able to stand 10 seconds safely Standing Ubsupported with Feet Together: Able to place feet together independently and stand 1 minute safely (modified, pt not fully able to bring feet together) From Standing, Reach Forward with Outstretched Arm: Can reach forward >5 cm  safely (2") From Standing Position, Pick up Object from Floor: Able to pick up shoe, needs supervision From Standing Position, Turn to Look Behind Over each Shoulder: Looks behind one side only/other side shows less weight shift Turn 360 Degrees: Able to turn 360 degrees safely but slowly Standing Unsupported, Alternately Place Feet on Step/Stool: Able to stand independently and complete 8 steps >20 seconds Standing Unsupported, One Foot in Front: Loses balance while stepping or standing Standing on One Leg: Unable to try or needs assist to prevent fall Total Score: 38  See FIM for current functional status  Therapy/Group: Individual Therapy  Ivery Quale 06/25/2015, 12:07 PM

## 2015-06-25 NOTE — Progress Notes (Signed)
Occupational Therapy Session Note  Patient Details  Name: Holly Sparks MRN: [id number] Date of Birth: 11/18/1941  Today's Date: 06/25/2015 OT Individual Time: 0900-1000 OT Individual Time Calculation (min): 60 min    Short Term Goals: Week 1:  OT Short Term Goal 1 (Week 1): STGs= LTGs secondary to short LOS  Skilled Therapeutic Interventions/Progress Updates:    Pt seen for 1:1 OT session with a focus on ADL retraining, functional ambulation, functional transfers, endurance, word finding, safety awareness, and patient education. Pt received seated in chair agreeable to participate in therapy session. Pt completed functional ambulation within room to retrieve clothing for ADL with supervision. Pt completed toileting and toilet transfer with supervision and min safety cues for midline position and controlled descent onto toilet. Pt then showered sit<>stand via grab bar with supervision due to safety. Pt completed dressing EOB at supervision. Pt complained of nausea/stomach discomfort throughout session and therefore required frequent rest breaks during session. Pt requesting to complete grooming sit<>stand due to fatigue/discomfort. Pt engaged in water protocol with therapist supervision. Pt then ambulated to ADL apt to complete walk-in shower transfer simulation with supervision. Therapist educated pt on strategies to increase safety with shower and recommendation for husband being present during ADL tasks to increase pt safety. Pt ambulated back to room apprx 150' with supervision. Pt required mod cues for increased gait speed and reciprocal arm swing during ambulation. Pt left seated in chair with call bell and all other needs within reach.   Therapy Documentation Precautions:  Precautions Precautions: Fall Restrictions Weight Bearing Restrictions: No General:   Pain:   Pt report of stomach discomfort throughout session.   See FIM for current functional status  Therapy/Group:  Individual Therapy  Alger Memos 06/25/2015, 10:04 AM

## 2015-06-25 NOTE — Discharge Summary (Signed)
[name], [name]:  [id number]  MEDICAL RECORD NO.:  [id number]  LOCATION:  4W22C                        FACILITY:  [facility]  PHYSICIAN:  Ranelle Oyster, M.D.DATE OF BIRTH:  May 21, 1941  DATE OF ADMISSION:  06/16/2015 DATE OF DISCHARGE:  06/27/2015                              DISCHARGE SUMMARY   DISCHARGE DIAGNOSES: 1. Functional deficits secondary to cardioembolic left MCA infarct. 2. Pain management. 3. Anxiety. 4. Dysphagia. 5. Hypertension with atrial fibrillation. 6. Diabetes mellitus. 7. Peripheral neuropathy.  HISTORY OF PRESENT ILLNESS:  This is a 74 year old right-handed female with history of hypertension and cardiac arrhythmia with atrial fibrillation.  She had been seen by [facility] in the past, maintained on metoprolol as well as aspirin, independent prior to admission, living with her husband, presented June 13, 2015, with speech difficulties and inability to follow commands.  Cranial CT scan negative.  EKG showed T-wave inversions.  Troponin negative.  The patient did not receive tPA.  MRI of the brain showed acute left MCA territory infarcts.  MRA of the head negative.  Echocardiogram with ejection fraction of 65% grade 1 diastolic dysfunction.  Carotid Dopplers with no ICA stenosis.  Neurology consulted, placed on Eliquis for CVA prophylaxis in light of atrial fibrillation.  Dysphagia #1 nectar thick liquids.  Physical and occupational therapy ongoing.  The patient was admitted for comprehensive rehab program.  PAST MEDICAL HISTORY:  See discharge diagnoses.  SOCIAL HISTORY:  Independent, living with husband.  Functional status upon admission to rehab services was minimal assist, to ambulate with a walker.  Min assist sit to supine, moderate assist side lying to sitting.  Min mod assist activities of daily living.  PHYSICAL EXAMINATION:  VITAL SIGNS:  Blood pressure 142/76, pulse 50, temperature 97, respirations  16. GENERAL:  This was an alert female, in no acute distress.  Makes good eye contact with examiner.  Some perseveration on certain words and ideas.  She is apraxic. LUNGS:  Clear to auscultation. CARDIAC:  Regular rate and rhythm. ABDOMEN:  Soft, nontender.  Good bowel sounds.  REHABILITATION HOSPITAL COURSE:  The patient was admitted to inpatient rehab services with therapies initiated on a 3-hour daily basis consisting of physical therapy, occupational therapy, speech therapy, and rehabilitation nursing.  The following issues were addressed during the patient's rehabilitation stay.  Pertaining to Holly Sparks's cardioembolic left MCA infarction, she remained on Eliquis, should follow up Neurology Services.  She did have a history of arrhythmias, atrial fibrillation which Cardiology [facility] had seen in the past.  Blood pressures controlled, on Lopressor, lisinopril and low-dose hydrochlorothiazide.  She had occasional bouts of shortness of breath related to her atrial fibrillation.  Chest x-ray was negative.  She was currently on a mechanical soft nectar thick liquid diet advanced as per Speech Therapy.  History of diabetes mellitus, peripheral neuropathy.  Hemoglobin A1c of 11.8, she remained on insulin therapy.  The patient received weekly collaborative interdisciplinary team conferences to discuss estimated length of stay, family teaching, and any barriers to her discharge.  The patient performed sit to stand, walked across the room to wheelchair minimal assist.  Set edge of bed with supervision.  The patient  performed fine motor tasks with external cues.  She was ambulating extended distances with assistive device with supervision.  Activities of daily living, she was able to gather belongings for bathing, dressing and grooming. Sessions focused on dynamic standing balance, cognitive remediation, word finding and activity tolerance.  It was discussed with  husband the need for 24-hour supervision for her safety.  Her sustained attention continued to improve, still needing occasional cuing which was discussed with Speech Therapy to family.  Full family teaching was completed. Plan discharge to home.  DISCHARGE MEDICATIONS: 1. Eliquis 5 mg p.o. b.i.d. 2. Lipitor 20 mg p.o. daily. 3. Lisinopril 20 mg p.o. daily. 4. Hydrochlorothiazide 12.5 mg p.o. daily. 5. Lantus insulin 20 units at bedtime. 6. Ativan 0.5 mg t.i.d. as needed for anxiety. 7. Lopressor 12.5 mg p.o. daily. 8. Protonix 40 mg p.o. daily. 9. Ultram 50 mg p.o. every 6 hours as needed pain, dispense of 60     tablets.  DIET:  Mechanical soft nectar liquids.  Advanced as per speech therapy.  FOLLOWUP:  The patient would follow up with Dr. Faith Rogue at the Outpatient Rehab Service office; as advised Dr. Roda Shutters in 1 month call for appointment; Dr. Gareth Morgan Medical Management; Dr. Daphene Jaeger, Cardiology Services, call for appointment.     Holly Sparks, P.A.   ______________________________ Ranelle Oyster, M.D.    DA/MEDQ  D:  06/25/2015  T:  06/25/2015  Job:  [id number]  cc:   Margrett Rud, MD Nicki Guadalajara, M.D. Mila Homer. [name], M.D.

## 2015-06-25 NOTE — Progress Notes (Signed)
[facility] PHYSICAL MEDICINE & REHABILITATION     PROGRESS NOTE    Subjective/Complaints: Still having some ? Nausea---mentioned diarrhea also but none reported by RN. Had chest pain yesterday---work up negative. ?anxiety ROS: Pt denies fever, rash/itching, headache, blurred or double vision, vomiting, abdominal pain, shortness of breath,no lightheadedness   Objective: Vital Signs: Blood pressure 119/70, pulse 71, temperature 97.7 F (36.5 C), temperature source Oral, resp. rate 16, height 5\' 6"  (1.676 m), weight 72.53 kg (159 lb 14.4 oz), SpO2 99 %. Dg Chest 2 View  06/24/2015   CLINICAL DATA:  Mid chest pain, shortness of breath today.  EXAM: CHEST  2 VIEW  COMPARISON:  06/13/2015  FINDINGS: Left basilar scarring. Right lung is clear. Heart is normal size. No effusions or acute bony abnormality.  IMPRESSION: No active cardiopulmonary disease.   Electronically Signed   By: Holly Sparks M.D.   On: 06/24/2015 11:56   Dg Swallowing Func-speech Pathology  06/23/2015    Objective Swallowing Evaluation:    Patient Details  Name: Holly Sparks MRN: [id number] Date of Birth: [birth year]  Today's Date: 06/23/2015 Time: SLP Start Time (ACUTE ONLY): 0900-SLP Stop Time (ACUTE ONLY): 0930 SLP Time Calculation (min) (ACUTE ONLY): 50 min  Past Medical History:  Past Medical History  Diagnosis Date  . Hypertension     x 10 yrs  . 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 High cholesterol   . Presence of permanent cardiac pacemaker   . Paroxysmal a-fib: Remote hx of afib 06/15/2015   Past Surgical History:  Past Surgical History  Procedure Laterality Date  . Neck surgery      x 2 for spur and disc problems  . Colonoscopy    . Colonoscopy N/A 05/08/2013    Procedure: COLONOSCOPY;  Surgeon: Malissa Hippo, MD;  Location: [facility]  ENDO SUITE;  Service: Endoscopy;  Laterality: N/A;  1015   HPI:  Other Pertinent Information: Patient is a 74 y.o. female, with  hypertension, hyperlipidemia, diabetes and recently an irregular  heartbeat,  presented to ER with difficulty speaking. Diagnosed with left  MCA CVA.   MBS completed 7/18 and recommended Dys. 3 textures with  nectar-thick liquids. Patient transferred to CIR on 7/19 and has been  participating in dysphagia treatment. Repeat MBS today to assess for  possible upgrade.   No Data Recorded  Assessment / Plan / Recommendation CHL IP CLINICAL IMPRESSIONS 06/23/2015  Therapy Diagnosis Mild pharyngeal phase dysphagia  Clinical Impression Patient presents with a mild sensory motor based  oropharyngeal dysphagia. Patient with a mildly delayed swallow initiation  and decreased laryngeal closure resulting in silent penetration of thin  liquids. Cueing for chin tuck with straw reduced penetration episodes,  however,  patient required moderate cues for completion of chin tuck. Mild  pharyngeal residuals noted post swallow may be due to oro-pharyngeal  weakness and/or presence of cervical hardware at C3-5. Residuals cleared  with cued dry swallows and did not significantly impact overall function.  Given communication and cognitive deficits impacting carryover of newly  learned information, recommend continuing use of nectar-thick liquids and  initiation of the water protocol.       CHL IP TREATMENT RECOMMENDATION 06/23/2015  Treatment Recommendations F/u OP SLP     CHL IP DIET RECOMMENDATION 06/23/2015  SLP Diet Recommendations Dysphagia 3 (Mech soft);Nectar  Liquid Administration via (None)  Medication Administration Whole meds with puree  Compensations Slow rate;Small sips/bites  Postural Changes and/or Swallow Maneuvers (  None)     CHL IP OTHER RECOMMENDATIONS 06/23/2015  Recommended Consults (None)  Oral Care Recommendations (None)  Other Recommendations Order thickener from pharmacy;Prohibited food  (jello, ice cream, thin soups);Remove water pitcher     CHL IP FOLLOW UP RECOMMENDATIONS 06/16/2015  Follow up Recommendations Inpatient Rehab     CHL IP FREQUENCY AND DURATION 06/15/2015  Speech Therapy Frequency  (ACUTE ONLY) min 2x/week  Treatment Duration (None)     Pertinent Vitals/Pain N/A    SLP Swallow Goals No flowsheet data found.  No flowsheet data found.    CHL IP REASON FOR REFERRAL 06/23/2015  Reason for Referral Objectively evaluate swallowing function     CHL IP ORAL PHASE 06/23/2015  Lips (None)  Tongue (None)  Mucous membranes (None)  Nutritional status (None)  Other (None)  Oxygen therapy (None)  Oral Phase WFL  Oral - Pudding Teaspoon (None)  Oral - Pudding Cup (None)  Oral - Honey Teaspoon (None)  Oral - Honey Cup (None)  Oral - Honey Syringe (None)  Oral - Nectar Teaspoon (None)  Oral - Nectar Cup (None)  Oral - Nectar Straw (None)  Oral - Nectar Syringe (None)  Oral - Ice Chips (None)  Oral - Thin Teaspoon (None)  Oral - Thin Cup (None)  Oral - Thin Straw (None)  Oral - Thin Syringe (None)  Oral - Puree (None)  Oral - Mechanical Soft (None)  Oral - Regular (None)  Oral - Multi-consistency (None)  Oral - Pill (None)  Oral Phase - Comment (None)      CHL IP PHARYNGEAL PHASE 06/23/2015  Pharyngeal Phase Impaired  Pharyngeal - Pudding Teaspoon (None)  Penetration/Aspiration details (pudding teaspoon) (None)  Pharyngeal - Pudding Cup (None)  Penetration/Aspiration details (pudding cup) (None)  Pharyngeal - Honey Teaspoon (None)  Penetration/Aspiration details (honey teaspoon) (None)  Pharyngeal - Honey Cup (None)  Penetration/Aspiration details (honey cup) (None)  Pharyngeal - Honey Syringe (None)  Penetration/Aspiration details (honey syringe) (None)  Pharyngeal - Nectar Teaspoon (None)  Penetration/Aspiration details (nectar teaspoon) (None)  Pharyngeal - Nectar Cup (None)  Penetration/Aspiration details (nectar cup) (None)  Pharyngeal - Nectar Straw (None)  Penetration/Aspiration details (nectar straw) (None)  Pharyngeal - Nectar Syringe (None)  Penetration/Aspiration details (nectar syringe) (None)  Pharyngeal - Ice Chips (None)  Penetration/Aspiration details (ice chips) (None)  Pharyngeal - Thin Teaspoon  (None)  Penetration/Aspiration details (thin teaspoon) (None)  Pharyngeal - Thin Cup (None)  Penetration/Aspiration details (thin cup) (None)  Pharyngeal - Thin Straw (None)  Penetration/Aspiration details (thin straw) (None)  Pharyngeal - Thin Syringe (None)  Penetration/Aspiration details (thin syringe') (None)  Pharyngeal - Puree (None)  Penetration/Aspiration details (puree) (None)  Pharyngeal - Mechanical Soft (None)  Penetration/Aspiration details (mechanical soft) (None)  Pharyngeal - Regular (None)  Penetration/Aspiration details (regular) (None)  Pharyngeal - Multi-consistency (None)  Penetration/Aspiration details (multi-consistency) (None)  Pharyngeal - Pill (None)  Penetration/Aspiration details (pill) (None)  Pharyngeal Comment (None)      CHL IP CERVICAL ESOPHAGEAL PHASE 06/23/2015  Cervical Esophageal Phase WFL  Pudding Teaspoon (None)  Pudding Cup (None)  Honey Teaspoon (None)  Honey Cup (None)  Honey Straw (None)  Nectar Teaspoon (None)  Nectar Cup (None)  Nectar Straw (None)  Nectar Sippy Cup (None)  Thin Teaspoon (None)  Thin Cup (None)  Thin Straw (None)  Thin Sippy Cup (None)  Cervical Esophageal Comment (None)    No flowsheet data found.         PAYNE, COURTNEY 06/23/2015, 4:18 PM  Recent Labs  06/24/15 0925  WBC 8.4  HGB 14.8  HCT 43.4  PLT 202    Recent Labs  06/24/15 0925  NA 136  K 3.8  CL 98*  GLUCOSE 189*  BUN 28*  CREATININE 1.49*  CALCIUM 9.5   CBG (last 3)   Recent Labs  06/24/15 1635 06/24/15 2043 06/25/15 0654  GLUCAP 129* 120* 106*    Wt Readings from Last 3 Encounters:  06/24/15 72.53 kg (159 lb 14.4 oz)  06/13/15 71.215 kg (157 lb)  10/13/14 71.215 kg (157 lb)    Physical Exam:  Constitutional: She appears well-developed. no distress HENT: oral mucosa pink and moist. Dentition good Head: Normocephalic.  Eyes: EOM are normal.  Neck: Normal range of motion. Neck supple. No thyromegaly present.  Cardiovascular:  Cardiac rate  controlled. Regular rhythm. No murmurs Respiratory: Effort normal and breath sounds normal. No respiratory distress. no rales and wheezes GI: Soft. Bowel sounds are normal. She exhibits no distension. No tenderness Neurological: She is alert.   Gradual improvement in language/apraxia---still present. RUE: 4+ delt, bicep, tricep, wrist and hi. LUE: 5/5 prox to distal. Right pronator drift resolved. RLE: 4/5 hf, 4 ke and 4/5 ankle. LLE 4+/5 prox to 5/5 distal. Senses pain and LT in all 4's. Mild right central 7 and tongue deviation. DTR s 1+.  Skin: Skin is warm and dry.  Psychiatric: She appears a little anxious   Assessment/Plan: 1. Functional deficits secondary to left MCA infarct which require 3+ hours per day of interdisciplinary therapy in a comprehensive inpatient rehab setting. Physiatrist is providing close team supervision and 24 hour management of active medical problems listed below. Physiatrist and rehab team continue to assess barriers to discharge/monitor patient progress toward functional and medical goals. FIM: FIM - Bathing Bathing Steps Patient Completed: Right Arm, Left Arm, Abdomen, Front perineal area, Buttocks, Right upper leg, Left upper leg, Chest, Right lower leg (including foot), Left lower leg (including foot) Bathing: 5: Supervision: Safety issues/verbal cues  FIM - Upper Body Dressing/Undressing Upper body dressing/undressing steps patient completed: Thread/unthread right sleeve of pullover shirt/dresss, Thread/unthread left sleeve of pullover shirt/dress, Put head through opening of pull over shirt/dress, Pull shirt over trunk Upper body dressing/undressing: 5: Supervision: Safety issues/verbal cues FIM - Lower Body Dressing/Undressing Lower body dressing/undressing steps patient completed: Thread/unthread right underwear leg, Thread/unthread left underwear leg, Pull underwear up/down, Thread/unthread right pants leg, Thread/unthread left pants leg, Pull pants  up/down, Fasten/unfasten pants, Don/Doff right sock, Don/Doff left sock, Don/Doff left shoe, Don/Doff right shoe, Fasten/unfasten right shoe, Fasten/unfasten left shoe Lower body dressing/undressing: 4: Steadying Assist  FIM - Toileting Toileting steps completed by patient: Adjust clothing prior to toileting, Performs perineal hygiene, Adjust clothing after toileting Toileting Assistive Devices: Grab bar or rail for support Toileting: 5: Supervision: Safety issues/verbal cues  FIM - Diplomatic Services operational officer Devices: Grab bars Toilet Transfers: 5-To toilet/BSC: Supervision (verbal cues/safety issues), 5-From toilet/BSC: Supervision (verbal cues/safety issues)  FIM - Banker Devices: Arm rests Bed/Chair Transfer: 5: Bed > Chair or W/C: Supervision (verbal cues/safety issues), 5: Chair or W/C > Bed: Supervision (verbal cues/safety issues)  FIM - Locomotion: Wheelchair Locomotion: Wheelchair: 0: Activity did not occur FIM - Locomotion: Ambulation Locomotion: Ambulation Assistive Devices: Other (comment) (no AD) Ambulation/Gait Assistance: 5: Supervision Locomotion: Ambulation: 5: Travels 150 ft or more with supervision/safety issues  Comprehension Comprehension Mode: Auditory Comprehension: 4-Understands basic 75 - 89% of the time/requires cueing 10 - 24% of the  time  Expression Expression Mode: Verbal Expression: 3-Expresses basic 50 - 74% of the time/requires cueing 25 - 50% of the time. Needs to repeat parts of sentences.  Social Interaction Social Interaction: 5-Interacts appropriately 90% of the time - Needs monitoring or encouragement for participation or interaction.  Problem Solving Problem Solving: 4-Solves basic 75 - 89% of the time/requires cueing 10 - 24% of the time  Memory Memory: 4-Recognizes or recalls 75 - 89% of the time/requires cueing 10 - 24% of the time  Medical Problem List and Plan: 1. Functional  deficits secondary to cardioembolic left MCA infarct--continue therapies  -progressing nicely to goals 2. DVT Prophylaxis/Anticoagulation: Eliquis. No new complications 3. Pain Management: Ultram as needed for breakthrough pain--pain under control 4. Mood/anxiety: Ativan 0.5 mg twice daily as needed. Periodic anxiety still evident 5. Neuropsych: This patient is capable of making decisions on her own behalf. 6. Skin/Wound Care: Routine skin checks. Oob.  7. Fluids/Electrolytes/Nutrition:continue potassium supplement. Encourage fluids.  8. Dysphagia. Dysphagia #3 nectar thick liquids. Diet not upgraded after MBS 9. Hypertension/atrial fibrillation.  HR on low side write parameters  -12.5mg  lopressor, lisinopril 20 mg daily,   -continue hctz at reduced dose  -cardiology saw patient yesterday---no new recs 10. Diabetes mellitus of peripheral neuropathy. Hemoglobin A1c 11.8. Marland Kitchen  checking blood sugars before meals and at bedtime.  -adjusted lantus to 20 u qhs with improvement---continue for now 11. Nausea: still with vague GI complaints/nausea---no diarrhea  -all labwork ok  -check KUB  -increase protonix to bid  -may be an anxiety component  LOS (Days) 9 A FACE TO FACE EVALUATION WAS PERFORMED  SWARTZ,ZACHARY T 06/25/2015 8:48 AM

## 2015-06-25 NOTE — Patient Care Conference (Signed)
Inpatient RehabilitationTeam Conference and Plan of Care Update Date: 06/23/2015   Time: 2:45 PM    Patient Name: Holly Sparks      Medical Record Number: [id number]  Date of Birth: [birth year] Sex: Female         Room/Bed: 4W22C/4W22C-01 Payor Info: Payor: Advertising copywriter MEDICARE / Plan: [facility] MEDICARE / Product Type: *No Product type* /    Admitting Diagnosis: L MCA infarct  Admit Date/Time:  06/16/2015  5:13 PM Admission Comments: No comment available   Primary Diagnosis:  Paroxysmal a-fib Principal Problem: Paroxysmal a-fib  Patient Active Problem List   Diagnosis Date Noted  . 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 Paroxysmal a-fib: Remote hx of afib 06/15/2015  . Cerebral infarction due to embolism of left middle cerebral artery   . Essential hypertension   . Stroke-like symptoms 06/13/2015  . HLD (hyperlipidemia) 06/13/2015  . DDD (degenerative disc disease), cervical 06/13/2015  . Elevated serum creatinine 06/13/2015  . Aphasia 06/13/2015  . Expressive aphasia   . Cerebral infarction due to unspecified mechanism   . Type 2 diabetes mellitus without complication   . IBS (irritable bowel syndrome) 04/24/2013  . High cholesterol 04/24/2013  . Diabetes 04/24/2013  . Essential hypertension, benign 04/24/2013    Expected Discharge Date: Expected Discharge Date: 06/26/15  Team Members Present: Physician leading conference: Dr. Faith Sparks Social Worker Present: Holly Jupiter, LCSW Nurse Present: Holly End, RN Holly Guppy, RN) PT Present: Holly Sparks, PT OT Present: Holly Sparks, Holly Sparks, OT SLP Present: Holly Sparks, SLP Other (Discipline and Name): Holly Glazier, RN [facility]) PPS Coordinator present : Holly Duck, RN, [facility]     Current Status/Progress Goal Weekly Team Focus  Medical   left MCA infarct. more nausea and fatigue recently ( due to constipation?)  improve stamina and activity toleracne  bowel mgt, CV control, bp balancing    Bowel/Bladder   Continent of bowel and bladder  Remain continent of bowel and bladder   Educate about signs and symptoms of constipation   Swallow/Nutrition/ Hydration   Supervision, Dys. 3 textures with nectar-thick liquids  Supervision  tolerance of current diet, family education    ADL's   supervision overall   mod I-supervision   functional mobility, dynamic balance activities, cognitive remediation, word finding, education, endurance   Mobility   supervision overall  mod I-supervision due to cognition  dynamic balance, functional mobility, cognitive remediation, activity tolerance, pt/family education   Communication   Mod-Max A   Min A   verbal expression of basic wants/needs, awareness of errors    Safety/Cognition/ Behavioral Observations  Min A-Supervision  Supervision for attention, Mod A for awareness   safety, attention, problem solving    Pain   Patient complains of occasioal pain in her right side of 5/10  pain less than oer equal to 4 on a scale of 0-10  assess pain q4h and prn, medicate as indicated   Skin   no skin issues at this time  no new skin injury/breakdown  assess skin q shift, continue protective measures    Rehab Goals Patient on target to meet rehab goals: Yes *See Care Plan and progress notes for long and short-term goals.  Barriers to Discharge: stamina, bp stability    Possible Resolutions to Barriers:  supervision, realistic expectations with me    Discharge Planning/Teaching Needs:  home with husband to provide 24/7 supervision  ongoing with spouse - concerns   Team Discussion:  Nausea recently but seems  to be decreasing.  Outing yesterday with tx but p declining physically and cognitively - did not go well.  Overall at a mod assist with language.  Still penetrating thin to must keep on nectar and work with water protocol.  Concern about husband's ability to provide competent supervision so will begin family ed tomorrow.  Revisions to  Treatment Plan:  None at this time   Continued Need for Acute Rehabilitation Level of Care: The patient requires daily medical management by a physician with specialized training in physical medicine and rehabilitation for the following conditions: Daily direction of a multidisciplinary physical rehabilitation program to ensure safe treatment while eliciting the highest outcome that is of practical value to the patient.: Yes Daily medical management of patient stability for increased activity during participation in an intensive rehabilitation regime.: Yes Daily analysis of laboratory values and/or radiology reports with any subsequent need for medication adjustment of medical intervention for : Neurological problems;Cardiac problems  Holly Sparks 06/23/2015, 2:45 PM

## 2015-06-25 NOTE — Progress Notes (Signed)
Occupational Therapy Session Note  Patient Details  Name: Holly Sparks MRN: [id number] Date of Birth: [birth year]  Today's Date: 06/25/2015 OT Individual Time: 1430-1500 OT Individual Time Calculation (min): 30 min    Short Term Goals: Week 1:  OT Short Term Goal 1 (Week 1): STGs= LTGs secondary to short LOS  Skilled Therapeutic Interventions/Progress Updates:    1:1 cognitive retraining: sorting, categorizing coupons with  Min A semantic cues for generative naming task within a specific category and Max-total A multimodal cues tocorrect errors trying to read word level on coupons. Patient demonstrated alternating attention to task in a quiet environment for 30 minutes with supervision verbal cues.  Therapy Documentation Precautions:  Precautions Precautions: Fall Restrictions Weight Bearing Restrictions: No Pain: Reported abdominal pain is better See FIM for current functional status  Therapy/Group: Individual Therapy  Roney Mans [facility] 06/25/2015, 3:15 PM

## 2015-06-25 NOTE — Progress Notes (Signed)
Social Work Patient ID: Holly Sparks, female   DOB: [birth year], 74 y.o.   MRN: [id number]  Met with pt and husband on Tuesday afternoon following team conference.  Reviewed target dc date of 7/29 with supervision goals.  Also stressed need for husband to complete family education and scheduled that to take place on 7/27.  Husband did come to participate, however, early into her PT session pt began complaining of nausea and other issues and education with PT and ST did not take place.  Per MD today, still checking some tests but other tests so far have not shown any problems and should be on track for d/c tomorrow.  Will follow up with pt, husband and tx about getting the needed education completes prior to this d/c.  Continue to follow.   Monterrius Cardosa, LCSW

## 2015-06-26 ENCOUNTER — Inpatient Hospital Stay (HOSPITAL_COMMUNITY): Payer: Medicare Other | Admitting: Physical Therapy

## 2015-06-26 ENCOUNTER — Inpatient Hospital Stay (HOSPITAL_COMMUNITY): Payer: Medicare Other | Admitting: Speech Pathology

## 2015-06-26 ENCOUNTER — Inpatient Hospital Stay (HOSPITAL_COMMUNITY): Payer: Medicare Other | Admitting: Occupational Therapy

## 2015-06-26 LAB — GLUCOSE, CAPILLARY
GLUCOSE-CAPILLARY: 91 mg/dL (ref 65–99)
Glucose-Capillary: 103 mg/dL — ABNORMAL HIGH (ref 65–99)
Glucose-Capillary: 76 mg/dL (ref 65–99)
Glucose-Capillary: 131 mg/dL — ABNORMAL HIGH (ref 65–99)

## 2015-06-26 MED ORDER — LISINOPRIL-HYDROCHLOROTHIAZIDE 20-25 MG PO TABS: 1.0000 | ORAL_TABLET | Freq: Every day | ORAL | Status: DC

## 2015-06-26 MED ORDER — APIXABAN 5 MG PO TABS
5.0000 mg | ORAL_TABLET | Freq: Two times a day (BID) | ORAL | Status: DC
Start: 2015-06-26 — End: 2020-03-18

## 2015-06-26 MED ORDER — TRAMADOL HCL 50 MG PO TABS: 50.0000 mg | ORAL_TABLET | Freq: Four times a day (QID) | ORAL | Status: DC | PRN

## 2015-06-26 MED ORDER — ATORVASTATIN CALCIUM 20 MG PO TABS
20.0000 mg | ORAL_TABLET | Freq: Every day | ORAL | Status: DC
Start: 2015-06-26 — End: 2020-03-11

## 2015-06-26 MED ORDER — METOPROLOL TARTRATE 50 MG PO TABS: 12.5000 mg | ORAL_TABLET | Freq: Every day | ORAL | Status: DC

## 2015-06-26 MED ORDER — INSULIN GLARGINE 100 UNIT/ML ~~LOC~~ SOLN: 20.0000 [IU] | Freq: Every day | SUBCUTANEOUS | Status: DC

## 2015-06-26 MED ORDER — PANTOPRAZOLE SODIUM 40 MG PO TBEC: 40.0000 mg | DELAYED_RELEASE_TABLET | Freq: Two times a day (BID) | ORAL | Status: DC

## 2015-06-26 MED ORDER — INSULIN STARTER KIT- SYRINGES (ENGLISH)
1.0000 | Freq: Once | Status: AC
  Administered 2015-06-26: 1
  Filled 2015-06-26: qty 1

## 2015-06-26 MED ORDER — LORAZEPAM 0.5 MG PO TABS: 0.5000 mg | ORAL_TABLET | Freq: Four times a day (QID) | ORAL | Status: DC | PRN

## 2015-06-26 NOTE — Progress Notes (Signed)
Physical Therapy Discharge Summary  Patient Details  Name: Holly Sparks MRN: [id number] Date of Birth: 1940/12/30  Today's Date: 06/26/2015 PT Individual Time: 1100-1200 60 min   Patient has met 10 of 10 long term goals due to improved balance, improved postural control, increased endurance, and increased strength.  Patient to discharge at an ambulatory level Supervision.   Patient's care partner is independent to provide the necessary physical and cognitive assistance at discharge.  Reasons goals not met: N/A  Recommendation:  Patient will benefit from ongoing skilled PT services in outpatient setting to continue to advance safe functional mobility, address ongoing impairments in endurance, balance, stength, cognition, and minimize fall risk.  Equipment: No equipment provided  Reasons for discharge: treatment goals met and discharge from hospital  Patient/family agrees with progress made and goals achieved: Yes   Skilled therapeutic interventions Session focused on family training with husband for functional mobility, balance, simulated car transfer, energy conservation, and cognition. Pt overall supervision for functional mobility. Recommend 24 hour supervision due to cognition and safety concerns. See below for further discharge details. Pt and husband with no further questions regarding discharge.  PT Discharge Precautions/Restrictions Precautions Precautions: Fall;Back Restrictions Weight Bearing Restrictions: No Pain Pain Assessment Pain Assessment: No/denies pain Vision/Perception  No change from baseline  Cognition Overall Cognitive Status: Impaired/Different from baseline Arousal/Alertness: Awake/alert Orientation Level: Oriented X4 Sustained Attention: Appears intact Memory: Impaired Memory Impairment: Decreased recall of new information Awareness: Appears intact Awareness Impairment: Emergent impairment Problem Solving: Impaired Problem Solving  Impairment: Verbal complex;Functional complex Organizing: Appears intact Safety/Judgment: Impaired Sensation Sensation Light Touch: Appears Intact Stereognosis: Not tested Hot/Cold: Appears Intact Proprioception: Appears Intact Coordination Gross Motor Movements are Fluid and Coordinated: No Fine Motor Movements are Fluid and Coordinated: Yes Coordination and Movement Description: slower movements with slight tremor Motor  Motor Motor: Abnormal postural alignment and control Motor - Skilled Clinical Observations: weakness and frequent rest breaks (weakness and frequent rest breaks) Motor - Discharge Observations: weakness and decreased endurance  Mobility Transfers Transfers: Yes Sit to Stand: 6: Modified independent (Device/Increase time) Stand to Sit: 6: Modified independent (Device/Increase time) Stand Pivot Transfers: 6: Modified independent (Device/Increase time) Locomotion  Ambulation Ambulation: Yes Ambulation/Gait Assistance: 5: Supervision Ambulation Distance (Feet): 150 Feet Assistive device: None Ambulation/Gait Assistance Details: Verbal cues for precautions/safety Ambulation/Gait Assistance Details: supervision from familly today, needs to have planned places to rest and cues to avoid objects Gait Gait: Yes Gait Pattern: Impaired Gait Pattern: Shuffle;Poor foot clearance - right;Decreased stride length;Right foot flat Gait velocity: decreased, .46ms on 10MWT Stairs / Additional Locomotion Stairs: Yes Stairs Assistance: 5: Supervision Stair Management Technique: Two rails;Step to pattern;Forwards Number of Stairs: 12 Height of Stairs: 6 Wheelchair Mobility Wheelchair Mobility: No (pt ambulatory)  Trunk/Postural Assessment  Cervical Assessment Cervical Assessment: Within Functional Limits Thoracic Assessment Thoracic Assessment: Within Functional Limits Lumbar Assessment Lumbar Assessment: Exceptions to [facility] Strength Overall Lumbar Strength:  Within functional limits for tasks performed Postural Control Postural Control: Within Functional Limits  Balance Standardized Balance Assessment Standardized Balance Assessment: Berg Balance Test Berg Balance Test Sit to Stand: Able to stand  independently using hands Standing Unsupported: Able to stand safely 2 minutes Sitting with Back Unsupported but Feet Supported on Floor or Stool: Able to sit safely and securely 2 minutes Stand to Sit: Controls descent by using hands Transfers: Able to transfer safely, definite need of hands Standing Unsupported with Eyes Closed: Able to stand 10 seconds safely Standing Ubsupported with Feet Together: Able to  place feet together independently and stand 1 minute safely (modified, pt not fully able to bring feet together) From Standing, Reach Forward with Outstretched Arm: Can reach forward >5 cm safely (2") From Standing Position, Pick up Object from Floor: Able to pick up shoe, needs supervision From Standing Position, Turn to Look Behind Over each Shoulder: Looks behind one side only/other side shows less weight shift Turn 360 Degrees: Able to turn 360 degrees safely but slowly Standing Unsupported, Alternately Place Feet on Step/Stool: Able to stand independently and complete 8 steps >20 seconds Standing Unsupported, One Foot in Front: Loses balance while stepping or standing Standing on One Leg: Unable to try or needs assist to prevent fall Total Score: 38 Static Standing Balance Static Standing - Level of Assistance: 5: Stand by assistance Dynamic Standing Balance Dynamic Standing - Balance Support: During functional activity;No upper extremity supported Dynamic Standing - Level of Assistance: 5: Stand by assistance Extremity Assessment  RUE Assessment RUE Assessment: Within Functional Limits LUE Assessment LUE Assessment: Within Functional Limits RLE Assessment RLE Assessment: Exceptions to [facility] RLE Strength RLE Overall Strength: Within  Functional Limits for tasks assessed RLE Overall Strength Comments: overall MMT 4/5 except knee flex 3+ LLE Assessment LLE Assessment: Exceptions to [facility] LLE Strength LLE Overall Strength: Deficits LLE Overall Strength Comments: overall MMT 4/5 except 3+ hip flex, and DF  See FIM for current functional status  Elsie Ra 06/26/2015, 4:17 PM

## 2015-06-26 NOTE — Progress Notes (Signed)
Occupational Therapy Discharge Summary  Patient Details  Name: Holly Sparks MRN: [id number] Date of Birth: 1941-03-29  Today's Date: 06/26/2015 OT Individual Time:  - 0830-1000 (90 min)      Patient has met 11 of 11 long term goals due to improved activity tolerance, improved balance, postural control, improved attention and improved awareness.  Patient to discharge at overall Supervision level.  Patient's care partner is independent to provide the necessary physical assistance at discharge.    Reasons goals not met: All goals met.  Pt needs supervision with balance when standing and moving or reaching  Recommendation:  Patient will benefit from ongoing skilled OT services in home health setting to continue to advance functional skills in the area of BADL, iADL and Reduce care partner burden.  Equipment: No equipment provided  Reasons for discharge: treatment goals met and discharge from hospital  Patient/family agrees with progress made and goals achieved: Yes  OT Discharge Precautions/Restrictions  Precautions Precautions: Fall;Back Restrictions Weight Bearing Restrictions: No General   Vital Signs Therapy Vitals Pulse Rate: (!) 133 Resp: 18 BP: (!) 98/55 mmHg Patient Position (if appropriate): Sitting Oxygen Therapy SpO2: 100 % O2 Device: Not Delivered Pain  6/10  Chest:  RN provided meds      Vision/Perception  Vision- Assessment Eye Alignment: Within Functional Limits Ocular Range of Motion: Within Functional Limits Tracking/Visual Pursuits: Able to track stimulus in all quads without difficulty Perception Perception: Within Functional Limits Praxis Praxis: Intact  Cognition Overall Cognitive Status: Impaired/Different from baseline (aphasia maint issue with expressing) Arousal/Alertness: Awake/alert Orientation Level: Oriented X4 (knew ) Attention: Sustained Sustained Attention: Appears intact Sustained Attention Impairment: Verbal  basic;Functional basic Memory: Impaired (oreinted x4;) Awareness: Appears intact Awareness Impairment: Emergent impairment;Anticipatory impairment;Intellectual impairment Problem Solving: Impaired Problem Solving Impairment: Verbal complex;Functional complex Executive Function: Writer: Impaired Behaviors: Lability Safety/Judgment: Impaired Comments:  (min cues for balance in stand) Sensation Sensation Light Touch: Appears Intact Stereognosis: Not tested Hot/Cold: Appears Intact Proprioception: Appears Intact Coordination Gross Motor Movements are Fluid and Coordinated: Yes Fine Motor Movements are Fluid and Coordinated: Yes Coordination and Movement Description: mild tremor in BUE Motor  Motor Motor: Ataxia Motor - Skilled Clinical Observations:  (weakness and frequent rest breaks) Motor - Discharge Observations:  (increased fatigue and rest breaks; chest pain on grad day) Mobility  Bed Mobility Bed Mobility: Supine to Sit Supine to Sit: 7: Independent Transfers Sit to Stand: 5: Supervision Sit to Stand Details: Verbal cues for precautions/safety Stand to Sit: 6: Modified independent (Device/Increase time)  Trunk/Postural Assessment  Cervical Assessment Cervical Assessment: Within Functional Limits Thoracic Assessment Thoracic Assessment: Within Functional Limits Lumbar Assessment Lumbar Assessment: Exceptions to [facility] Lumbar Strength Overall Lumbar Strength: Deficits Postural Control Postural Control: Within Functional Limits  Balance Balance Balance Assessed: Yes Static Sitting Balance Static Sitting - Level of Assistance: 6: Modified independent (Device/Increase time) Dynamic Sitting Balance Dynamic Sitting - Balance Support: Right upper extremity supported Dynamic Sitting - Level of Assistance: 6: Modified independent (Device/Increase time) Dynamic Sitting - Balance Activities: Lateral lean/weight shifting;Forward lean/weight shifting;Reaching  across midline;Reaching for objects Sitting balance - Comments:  (Supervision) Static Standing Balance Static Standing - Level of Assistance: 5: Stand by assistance Dynamic Standing Balance Dynamic Standing - Balance Support: Right upper extremity supported;During functional activity Dynamic Standing - Level of Assistance: 5: Stand by assistance Extremity/Trunk Assessment RUE Assessment RUE Assessment: Within Functional Limits LUE Assessment LUE Assessment: Within Functional Limits  See FIM for current functional status Skilled intervention:  Pt gathered  supplies for bathing and dressing with supervision and cues for organizing and sequencing.  Pt c/o chest pain  6/10 at end of session with increased fatigue and frequent rest breaks.  HR= 73, O2= 98%; BP= 87/58.  Husband present.  Educated pt/husband on safety with getting in/out of shower stall, using shower seat for sitting for safety and energy conservation.  Husband expressed concern on things he does and will have to give up  In order to help wife with balance and safety.Discussed shower seat, grab bar, HH shower head for home.    Lisa Roca 06/26/2015, 10:31 AM

## 2015-06-26 NOTE — Progress Notes (Signed)
Patient and husband received discharge instructions and prescriptions from Deatra Ina, PA-C with verbal understanding. Discharge set for Saturday 06/27/15. Patient to be discharged to home with husband.  Patient and husband received insulin dosage and insulin syringe instructions and demonstrations from RN. Patient gave a return demonstration to the nurse. Both husband and patient voiced concern about giving a shot however, it was explained that the ordered called for 20 units once daily. The easiest the would be to do all the evening meds at 8pm, including the insulin shot. It was also explained to do all the morning meds at 8 am including checking a daily blood glucose level.  Free DM classes are offered at [facility], RD,CDE,[phone]) and information was given to the family.

## 2015-06-26 NOTE — Progress Notes (Signed)
Occupational Therapy Session Note  Patient Details  Name: Holly Sparks MRN: [id number] Date of Birth: 07-10-1941  Today's Date: 06/26/2015 OT Individual Time: 1315-1345 OT Individual Time Calculation (min): 30 min    Short Term Goals: Week 1:  OT Short Term Goal 1 (Week 1): STGs= LTGs secondary to short LOS  Skilled Therapeutic Interventions/Progress Updates:    Make up Session: Pt seen for make up therapy session focusing on functional mobility, functional transfers, caregiver training, and d/c planning. Pt in supine upon arrival, voicing fatigue, however, agreeable to tx session with husband present throughout. Pt transferred supine> EOB with supervision and ambulated into bathroom with close supervision to complete toileting task at overall supervision level. Pt then self propelled w/c ~40 ft with significantly increased time and verbal, tactile, and demonstration cues provided for w/c propulsion techniques. Pt taken remainder of way to ADL apartment for energy and time management. In apartment, pt completed simulated shower stall transfer with education provided regarding DME, bathroom safety modifications including use of grab bars and pt's need for supervision upon entering and exiting shower and during standing bathing segments. Pt then ambulated to standard bed where she completed bed mobility mod I. Pt returned to room at end of session, pushing w/c with supervision. Pt left in w/c at end of session, all needs in reach and caregiver present. Pt provided with beverage at end of session, pt's caregiver voiced understanding with protocol to thicken liquids for pt upon d/c per SLP recommendation.   Education provided throughout session regarding need for assist, DME, energy conservation, preparing nectar thick beverages, and d/c planning.   Therapy Documentation Precautions:  Precautions Precautions: Fall, Back Restrictions Weight Bearing Restrictions: No Pain: Pain Assessment Pain  Assessment: No/denies pain  See FIM for current functional status  Therapy/Group: Individual Therapy  Lewis, Calleen Alvis C 06/26/2015, 2:52 PM

## 2015-06-26 NOTE — Progress Notes (Signed)
[place] PHYSICAL MEDICINE & REHABILITATION     PROGRESS NOTE    Subjective/Complaints: Chest pain relieved by Xanax this morning No further abdominal complaints, reviewed KUB from yesterday. Discussed with PA, cardiology has reviewed blood pressure meds and recommends continuing at current dosing. ROS: Pt denies, vomiting, abdominal pain, shortness of breath,no lightheadedness   Objective: Vital Signs: Blood pressure 98/55, pulse 133, temperature 97.5 F (36.4 C), temperature source Oral, resp. rate 18, height  (1.676 m), weight 72.53 kg (159 lb 14.4 oz), SpO2 100 %. Dg Chest 2 View  06/24/2015   CLINICAL DATA:  Mid chest pain, shortness of breath today.  EXAM: CHEST  2 VIEW  COMPARISON:  06/13/2015  FINDINGS: Left basilar scarring. Right lung is clear. Heart is normal size. No effusions or acute bony abnormality.  IMPRESSION: No active cardiopulmonary disease.   Electronically Signed   By: Charlett Nose M.D.   On: 06/24/2015 11:56   Dg Abd 1 View  06/25/2015   CLINICAL DATA:  Diffuse abdominal pain for the past 2 days with severe diarrhea.  EXAM: ABDOMEN - 1 VIEW  COMPARISON:  Previous CT and ultrasound examinations.  FINDINGS: Normal bowel gas pattern. 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 Lumbar spine degenerative changes and minimal scoliosis.  IMPRESSION: No acute abnormality.   Electronically Signed   By: Beckie Salts M.D.   On: 06/25/2015 13:56    Recent Labs  06/24/15 0925  WBC 8.4  HGB 14.8  HCT 43.4  PLT 202    Recent Labs  06/24/15 0925  NA 136  K 3.8  CL 98*  GLUCOSE 189*  BUN 28*  CREATININE 1.49*  CALCIUM 9.5   CBG (last 3)   Recent Labs  06/25/15 1705 06/25/15 2106 06/26/15 0656  GLUCAP 145* 163* 91    Wt Readings from Last 3 Encounters:  06/24/15 72.53 kg (159 lb 14.4 oz)  06/13/15 71.215 kg (157 lb)  10/13/14 71.215 kg (157 lb)    Physical Exam:  Constitutional: She appears well-developed. no distress HENT:  oral mucosa pink and moist. Dentition good Head: Normocephalic.  Eyes: EOM are normal.  Neck: Normal range of motion. Neck supple. No thyromegaly present.  Cardiovascular:  Cardiac rate controlled. Regular rhythm. No murmurs Respiratory: Effort normal and breath sounds normal. No respiratory distress. no rales and wheezes GI: Soft. Bowel sounds are normal. She exhibits no distension. No tenderness Neurological: She is alert.   Gradual improvement in language/apraxia---still present. RUE: 4+ delt, bicep, tricep, wrist and hi. LUE: 5/5 prox to distal. Right pronator drift resolved. RLE: 4/5 hf, 4 ke and 4/5 ankle. LLE 4+/5 prox to 5/5 distal. Senses pain and LT in all 4's. Mild right central 7 and tongue deviation. DTR s 1+.  Skin: Skin is warm and dry.  Psychiatric: She appears a little anxious   Assessment/Plan: 1. Functional deficits secondary to left MCA infarct which require 3+ hours per day of interdisciplinary therapy in a comprehensive inpatient rehab setting. Physiatrist is providing close team supervision and 24 hour management of active medical problems listed below. Physiatrist and rehab team continue to assess barriers to discharge/monitor patient progress toward functional and medical goals. Plan his home tomorrow after M.D. Rounds. Instructions today. See discharge summary FIM: FIM - Bathing Bathing Steps Patient Completed: Right Arm, Left Arm, Abdomen, Front perineal area, Buttocks, Right upper leg, Left upper leg, Chest, Right lower leg (including foot), Left lower leg (including foot) Bathing: 5: Supervision: Safety issues/verbal cues  FIM - Upper Body Dressing/Undressing Upper body dressing/undressing steps patient completed: Thread/unthread right sleeve of pullover shirt/dresss, Thread/unthread left sleeve of pullover shirt/dress, Put head through opening of pull over shirt/dress, Pull shirt over trunk Upper body dressing/undressing: 5: Supervision: Safety  issues/verbal cues FIM - Lower Body Dressing/Undressing Lower body dressing/undressing steps patient completed: Thread/unthread right underwear leg, Thread/unthread left underwear leg, Pull underwear up/down, Thread/unthread right pants leg, Thread/unthread left pants leg, Pull pants up/down, Fasten/unfasten pants, Don/Doff right sock, Don/Doff left sock, Don/Doff left shoe, Don/Doff right shoe, Fasten/unfasten right shoe, Fasten/unfasten left shoe Lower body dressing/undressing: 5: Supervision: Safety issues/verbal cues  FIM - Toileting Toileting steps completed by patient: Adjust clothing prior to toileting, Performs perineal hygiene, Adjust clothing after toileting Toileting Assistive Devices: Grab bar or rail for support Toileting: 5: Supervision: Safety issues/verbal cues  FIM - Diplomatic Services operational officer Devices: Therapist, music Transfers: 5-To toilet/BSC: Supervision (verbal cues/safety issues), 5-From toilet/BSC: Supervision (verbal cues/safety issues)  FIM - Banker Devices: Arm rests Bed/Chair Transfer: 5: Bed > Chair or W/C: Supervision (verbal cues/safety issues), 5: Chair or W/C > Bed: Supervision (verbal cues/safety issues)  FIM - Locomotion: Wheelchair Locomotion: Wheelchair: 0: Activity did not occur FIM - Locomotion: Ambulation Locomotion: Ambulation Assistive Devices: Other (comment) (No AD used) Ambulation/Gait Assistance: 5: Supervision Locomotion: Ambulation: 5: Travels 150 ft or more with supervision/safety issues  Comprehension Comprehension Mode: Auditory Comprehension: 4-Understands basic 75 - 89% of the time/requires cueing 10 - 24% of the time  Expression Expression Mode: Verbal Expression: 3-Expresses basic 50 - 74% of the time/requires cueing 25 - 50% of the time. Needs to repeat parts of sentences.  Social Interaction Social Interaction: 5-Interacts appropriately 90% of the time - Needs  monitoring or encouragement for participation or interaction.  Problem Solving Problem Solving: 4-Solves basic 75 - 89% of the time/requires cueing 10 - 24% of the time  Memory Memory: 4-Recognizes or recalls 75 - 89% of the time/requires cueing 10 - 24% of the time  Medical Problem List and Plan: 1. 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 DVT Prophylaxis/Anticoagulation: Eliquis. No new complications 3. Pain Management: Ultram as needed for breakthrough pain--pain under control 4. Mood/anxiety: Ativan 0.5 mg twice daily as needed. Periodic anxiety still evident 5. Neuropsych: This patient is capable of making decisions on her own behalf. 6. Skin/Wound Care: Routine skin checks. Oob.  7. Fluids/Electrolytes/Nutrition:continue potassium supplement. Encourage fluids.  8. Dysphagia. Dysphagia #3 nectar thick liquids. Diet not upgraded after MBS 9. Hypertension/atrial fibrillation.  HR variable, tachycardia also may be due to anxiety. Her heart rate is now normal at 88 but was elevated this morning at 133 before she received her Xanax.  -12.5mg  lopressor, lisinopril 20 mg daily,   -continue hctz at reduced dose  -cardiology saw patient yesterday---no new recs 10. Diabetes mellitus of peripheral neuropathy. Hemoglobin A1c 11.8. Marland Kitchen  checking blood sugars before meals and at bedtime.  -adjusted lantus to 20 u qhs with improvement---continue for now 11. Nausea: improved no further w/u needed at this time  LOS (Days) 10 A FACE TO FACE EVALUATION WAS PERFORMED   Erick Colace 06/26/2015 9:52 AM

## 2015-06-26 NOTE — Progress Notes (Signed)
Speech Language Pathology Session Note & Discharge Summary  Patient Details  Name: Holly Sparks MRN: [id number] Date of Birth: [birth year]  Today's Date: 06/26/2015 SLP Individual Time: 1000-1100 SLP Individual Time Calculation (min): 60 min   Skilled Therapeutic Interventions:  Skilled treatment session focused on patient and family education with the patent's husband. SLP facilitated session by providing verbal education, demonstration and handouts in regards to the patient's current swallowing function, diet recommendations, appropriate textures, ability to thicken liquids and swallowing compensatory strategies. Both were also educated on patient's current cognitive-linguistic function and strategies to utilize to maximize auditory comprehension, verbal expression, safety, attention and recall. SLP also reinforced that the patient will require 24 hour supervision at home and assistance with functional tasks like medication management. Patient's husband verbalized and demonstrated understanding of all information.  Patient will discharge home with 24 hour supervision from family. Patient left upright in wheelchair with husband present.     Patient has met 7 of 7 long term goals.  Patient to discharge at [facility] level.   Reasons goals not met: N/A   Clinical Impression/Discharge Summary: Patient has made functional gains and has met 7 of 7 LTG's this admission due to increased swallowing function, functional communication and cognitive function.  Currently, patient is consuming Dys. 3 textures with nectar-thick liquids without overt s/s of aspiration and requires supervision verbal cues for use of swallowing compensatory strategies. Patient had repeat MBS on 06/23/15 and demonstrated silent penetration with thin liquids that was eliminated with the use of a chin tuck, however, patient unable to utilize consistently at this time and nectar-thick liquids were recommended for safest PO intake  with initiation of the water protocol.  Patient requires overall Min A for auditory comprehension of basic information and for verbal expression of basic information in regards to wants/needs and naming common objects.  Patient also requires Min A-supervision for safety, attention recall of functional information.  Patient and family education is complete and patient will discharge home with 24 hour supervision from family.  Patient would benefit from skilled SLP f/u to maximize functional communication, swallowing function and cognitive function in order to maximize her overall functional independence and reduce caregiver burden.   Care Partner:  Caregiver Able to Provide Assistance: Yes  Type of Caregiver Assistance: Physical;Cognitive  Recommendation:  Outpatient SLP;24 hour supervision/assistance  Rationale for SLP Follow Up: Maximize functional communication;Maximize cognitive function and independence;Reduce caregiver burden;Maximize swallowing safety   Equipment: Thickener    Reasons for discharge: Discharged from hospital;Treatment goals met   Patient/Family Agrees with Progress Made and Goals Achieved: Yes   See FIM for current functional status  Hudson Lehmkuhl 06/26/2015, 12:18 PM

## 2015-06-26 NOTE — Discharge Instructions (Signed)
Inpatient Rehab Discharge Instructions  Holly Sparks Discharge date and time: No discharge date for patient encounter.   Activities/Precautions/ Functional Status: Activity: activity as tolerated Diet: Mechanical soft nectar liquids Wound Care: none needed Functional status:  ___ No restrictions     ___ Walk up steps independently ___ 24/7 supervision/assistance   ___ Walk up steps with assistance ___ Intermittent supervision/assistance  ___ Bathe/dress independently ___ Walk with walker     ___ Bathe/dress with assistance ___ Walk Independently    ___ Shower independently __x STROKE/TIA DISCHARGE INSTRUCTIONS SMOKING Cigarette smoking nearly doubles your risk of having a stroke & is the single most alterable risk factor  If you smoke or have smoked in the last 12 months, you are advised to quit smoking for your health.  Most of the excess cardiovascular risk related to smoking disappears within a year of stopping.  Ask you doctor about anti-smoking medications  [place] Quit Line: 1-800-QUIT NOW  Free Smoking Cessation Classes [phone]  CHOLESTEROL Know your levels; limit fat & cholesterol in your diet  Lipid Panel     Component Value Date/Time   CHOL 135 06/14/2015 0230   TRIG 185* 06/14/2015 0230   HDL 28* 06/14/2015 0230   CHOLHDL 4.8 06/14/2015 0230   VLDL 37 06/14/2015 0230   LDLCALC 70 06/14/2015 0230      Many patients benefit from treatment even if their cholesterol is at goal.  Goal: Total Cholesterol (CHOL) less than 160  Goal:  Triglycerides (TRIG) less than 150  Goal:  HDL greater than 40  Goal:  LDL (LDLCALC) less than 100   BLOOD PRESSURE American Stroke Association blood pressure target is less that 120/80 mm/Hg  Your discharge blood pressure is:  BP: 121/76 mmHg  Monitor your blood pressure  Limit your salt and alcohol intake  Many individuals will require more than one medication for high blood pressure  DIABETES (A1c is a blood sugar  average for last 3 months) Goal HGBA1c is under 7% (HBGA1c is blood sugar average for last 3 months)  Diabetes:    Lab Results  Component Value Date   HGBA1C 11.8* 06/14/2015     Your HGBA1c can be lowered with medications, healthy diet, and exercise.  Check your blood sugar as directed by your physician  Call your physician if you experience unexplained or low blood sugars.  PHYSICAL ACTIVITY/REHABILITATION Goal is 30 minutes at least 4 days per week  Activity: Increase activity slowly, Therapies: Physical Therapy: Home Health Return to work:   Activity decreases your risk of heart attack and stroke and makes your heart stronger.  It helps control your weight and blood pressure; helps you relax and can improve your mood.  Participate in a regular exercise program.  Talk with your doctor about the best form of exercise for you (dancing, walking, swimming, cycling).  DIET/WEIGHT Goal is to maintain a healthy weight  Your discharge diet is: DIET DYS 3 Room service appropriate?: Yes; Fluid consistency:: Nectar Thick  liquids Your height is:  Height:  (167.6 cm) Your current weight is: Weight: 72.3 kg (159 lb 6.3 oz) Your Body Mass Index (BMI) is:  BMI (Calculated): 25.8  Following the type of diet specifically designed for you will help prevent another stroke.  Your goal weight range is:    Your goal Body Mass Index (BMI) is 19-24.  Healthy food habits can help reduce 3 risk factors for stroke:  High cholesterol, hypertension, and excess weight.  RESOURCES Stroke/Support  Group:  Call [phone]   STROKE EDUCATION PROVIDED/REVIEWED AND GIVEN TO PATIENT Stroke warning signs and symptoms How to activate emergency medical system (call 911). Medications prescribed at discharge. Need for follow-up after discharge. Personal risk factors for stroke. Pneumonia vaccine given:  Flu vaccine given:  My questions have been answered, the writing is legible, and I understand these  instructions.  I will adhere to these goals & educational materials that have been provided to me after my discharge from the hospital.    _ Walk with assistance    ___ Shower with assistance ___ No alcohol     ___ Return to work/school ________     COMMUNITY REFERRALS UPON DISCHARGE:    Outpatient: PT       ST                 Agency:  [name] Phone:  [phone]               Appointment Date/Time:  07/06/15 @ 1:45 pm  (Speech Therapy)                                                                  07/07/15 @ 9:30 am (Physical Therapy)                                                                              *arrive 30 mins prior to each appointment*   GENERAL COMMUNITY RESOURCES FOR PATIENT/FAMILY:  Support Groups:  [facility] support group    Caregiver Support:  (handout)      Special Instructions:    My questions have been answered and I understand these instructions. I will adhere to these goals and the provided educational materials after my discharge from the hospital.  Patient/Caregiver Signature _______________________________ Date __________  Clinician Signature _______________________________________ Date __________  Please bring this form and your medication list with you to all your follow-up doctor's appointments.

## 2015-06-27 LAB — GLUCOSE, CAPILLARY: GLUCOSE-CAPILLARY: 78 mg/dL (ref 65–99)

## 2015-06-27 NOTE — Progress Notes (Signed)
Patient is discharge to home accompanied by spouse and NT. Patient's stable. Vital signs taken and recorded.  No signs and symptoms of respiratory distress noted. Denies any pain. Patient teaching with teach back regarding giving insulin given. Patient spouse verbalizes understanding . All personal belongings given.

## 2015-06-27 NOTE — Progress Notes (Signed)
Patient ID: Holly Sparks, female   DOB: 06-27-1941, 74 y.o.   MRN: [id number]    [facility]     PROGRESS NOTE   06/27/15.  Subjective/Complaints: Doing well and anxious for discharge.  Today.  Discharge planning.  Complete  ROS: Pt denies, vomiting, abdominal pain, shortness of breath,no lightheadedness   Objective: Vital Signs: Blood pressure 92/59, pulse 63, temperature 98.1 F (36.7 C), temperature source Oral, resp. rate 18, height  (1.676 m), weight 159 lb 14.4 oz (72.53 kg), SpO2 98 %. Dg Abd 1 View  06/25/2015   CLINICAL DATA:  Diffuse abdominal pain for the past 2 days with severe diarrhea.  EXAM: ABDOMEN - 1 VIEW  COMPARISON:  Previous CT and ultrasound examinations.  FINDINGS: Normal bowel gas pattern. 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 Lumbar spine degenerative changes and minimal scoliosis.  IMPRESSION: No acute abnormality.   Electronically Signed   By: Beckie Salts M.D.   On: 06/25/2015 13:56    Recent Labs  06/24/15 0925  WBC 8.4  HGB 14.8  HCT 43.4  PLT 202    Recent Labs  06/24/15 0925  NA 136  K 3.8  CL 98*  GLUCOSE 189*  BUN 28*  CREATININE 1.49*  CALCIUM 9.5   CBG (last 3)   Recent Labs  06/26/15 1657 06/26/15 2024 06/27/15 0649  GLUCAP 76 131* 78    Wt Readings from Last 3 Encounters:  06/24/15 159 lb 14.4 oz (72.53 kg)  06/13/15 157 lb (71.215 kg)  10/13/14 157 lb (71.215 kg)    Physical Exam:  Constitutional: She appears well-developed. no distress HENT: oral mucosa pink and moist. Dentition good Head: Normocephalic.  Eyes: EOM are normal.  Neck: Normal range of motion. Neck supple. No thyromegaly present.  Cardiovascular:  Cardiac rate controlled. Regular rhythm. No murmurs Respiratory: Effort normal and breath sounds normal. No respiratory distress. no rales and wheezes GI: Soft. Bowel sounds are normal. She exhibits no distension. No  tenderness Neurological: She is alert.   Gradual improvement in language/apraxia---still present. RUE: 4+ delt, bicep, tricep, wrist and hi. LUE: 5/5 prox to distal. Right pronator drift resolved. RLE: 4/5 hf, 4 ke and 4/5 ankle. LLE 4+/5 prox to 5/5 distal. Senses pain and LT in all 4's. Mild right central 7 and tongue deviation. DTR s 1+.  Skin: Skin is warm and dry.  Psychiatric: She appears a little anxious   Assessment/Plan: 1. 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 DVT Prophylaxis/Anticoagulation: Eliquis. No new complications 3. Pain Management: Ultram as needed for breakthrough pain--pain under control 4. Mood/anxiety: Ativan 0.5 mg twice daily as needed. Periodic anxiety still evident 5. Neuropsych: This patient is capable of making decisions on her own behalf. 6. Skin/Wound Care: Routine skin checks. Oob.  7. Fluids/Electrolytes/Nutrition:continue potassium supplement. Encourage fluids.  8. Dysphagia. Dysphagia #3 nectar thick liquids. Diet not upgraded after MBS 9. Hypertension/atrial fibrillation.  HR variable, tachycardia also may be due to anxiety. Her heart rate is now normal at 88 but was elevated this morning at 133 before she received her Xanax.  -12.5mg  lopressor, lisinopril 20 mg daily,   -continue hctz at reduced dose  -cardiology saw patient yesterday---no new recs 10. Diabetes mellitus of peripheral neuropathy. Hemoglobin A1c 11.8. Marland Kitchen  checking blood sugars before meals and at bedtime.  -adjusted lantus to 20 u qhs with improvement---continue for now 11. Stable for d/c.  LOS (  Days) 11 A FACE TO FACE EVALUATION WAS PERFORMED   Rogelia Boga 06/27/2015 8:58 AM

## 2015-06-29 ENCOUNTER — Telehealth: Payer: Self-pay | Admitting: *Deleted

## 2015-06-29 NOTE — Progress Notes (Signed)
Social Work  Discharge Note  The overall goal for the admission was met for:   Discharge location: Yes - home with husband  Length of Stay: Yes - 11 days  Discharge activity level: Yes - supervision  Home/community participation: Yes  Services provided included: MD, RD, PT, OT, SLP, RN, TR, Pharmacy and [place]: Medicare (*[facility] Medicare)  Follow-up services arranged: Outpatient: PT, ST via [place] and Patient/Family has no preference for HH/DME agencies  Comments (or additional information):  Patient/Family verbalized understanding of follow-up arrangements: Yes  Individual responsible for coordination of the follow-up plan: pt/spouse  Confirmed correct DME delivered: NA    Jerrald Doverspike

## 2015-06-29 NOTE — Telephone Encounter (Signed)
Pam from [place] pharmacy called and told us that Holly Sparks needed a Prior auth on her Eliquis today, she did not have any medication.  She was just released from inpatient rehab unit [facility] 7/ 28/16.  I told her that she would have to fax something to the PA Dan Angiuilli or call to speak with him, because we have not seen this patient and his Eliquis will be coming from his primary care office.  Because it was needed now, I gave her the number for the PA office on [facility] [address] and the fax number to their office, and instructed her if she did not reach them she could call [facility] nursing station and get them to page Dan.  I also faxed over myself to the PA office a notie informing Jesusita Oka that this was needed right away.

## 2015-07-03 ENCOUNTER — Telehealth: Payer: Self-pay | Admitting: *Deleted

## 2015-07-03 NOTE — Telephone Encounter (Signed)
Prior authorization for Eliquis 5 mg tabs has been approved from 06/30/15 thru 06/29/16.  Auth: [id number]

## 2015-07-06 ENCOUNTER — Ambulatory Visit (HOSPITAL_COMMUNITY): Payer: Medicare Other | Attending: Physical Medicine & Rehabilitation | Admitting: Speech Pathology

## 2015-07-06 DIAGNOSIS — R269 Unspecified abnormalities of gait and mobility: Secondary | ICD-10-CM | POA: Insufficient documentation

## 2015-07-06 DIAGNOSIS — I6981 Cognitive deficits following other cerebrovascular disease: Secondary | ICD-10-CM | POA: Insufficient documentation

## 2015-07-06 DIAGNOSIS — R52 Pain, unspecified: Secondary | ICD-10-CM | POA: Insufficient documentation

## 2015-07-06 DIAGNOSIS — R0602 Shortness of breath: Secondary | ICD-10-CM | POA: Insufficient documentation

## 2015-07-06 DIAGNOSIS — R6889 Other general symptoms and signs: Secondary | ICD-10-CM | POA: Insufficient documentation

## 2015-07-06 DIAGNOSIS — R2689 Other abnormalities of gait and mobility: Secondary | ICD-10-CM | POA: Insufficient documentation

## 2015-07-06 DIAGNOSIS — I69320 Aphasia following cerebral infarction: Secondary | ICD-10-CM | POA: Insufficient documentation

## 2015-07-06 DIAGNOSIS — R29898 Other symptoms and signs involving the musculoskeletal system: Secondary | ICD-10-CM | POA: Insufficient documentation

## 2015-07-07 ENCOUNTER — Ambulatory Visit (HOSPITAL_COMMUNITY): Payer: Medicare Other | Admitting: Speech Pathology

## 2015-07-07 ENCOUNTER — Ambulatory Visit (HOSPITAL_COMMUNITY): Payer: Medicare Other | Admitting: Physical Therapy

## 2015-07-07 DIAGNOSIS — I69320 Aphasia following cerebral infarction: Secondary | ICD-10-CM

## 2015-07-07 DIAGNOSIS — R2689 Other abnormalities of gait and mobility: Secondary | ICD-10-CM | POA: Diagnosis present

## 2015-07-07 DIAGNOSIS — R269 Unspecified abnormalities of gait and mobility: Secondary | ICD-10-CM

## 2015-07-07 DIAGNOSIS — R29898 Other symptoms and signs involving the musculoskeletal system: Secondary | ICD-10-CM

## 2015-07-07 DIAGNOSIS — I69319 Unspecified symptoms and signs involving cognitive functions following cerebral infarction: Secondary | ICD-10-CM

## 2015-07-07 DIAGNOSIS — R6889 Other general symptoms and signs: Secondary | ICD-10-CM

## 2015-07-07 DIAGNOSIS — I6981 Cognitive deficits following other cerebrovascular disease: Secondary | ICD-10-CM | POA: Diagnosis present

## 2015-07-07 DIAGNOSIS — R52 Pain, unspecified: Secondary | ICD-10-CM | POA: Diagnosis present

## 2015-07-07 DIAGNOSIS — R0602 Shortness of breath: Secondary | ICD-10-CM | POA: Diagnosis present

## 2015-07-07 NOTE — Therapy (Signed)
Holly Sparks [address] [place], [place], [address] Phone: [phone]   Fax:  [phone]  Speech Language Pathology Evaluation  Patient Details  Name: Holly Sparks MRN: [id number] Date of Birth: 1941/05/31 Referring Provider:  Erick Colace, MD  Encounter Date: 07/07/2015      End of Session - 07/07/15 1602    Visit Number 1   Number of Visits 16   Date for SLP Re-Evaluation 09/06/15   Authorization Type UHC Medicare   Authorization Time Period 07/07/2015-09/06/2015   SLP Start Time 1030   SLP Stop Time  1110   SLP Time Calculation (min) 40 min   Activity Tolerance Patient tolerated treatment well      Past Medical History  Diagnosis Date  . Hypertension     x 10 yrs  . 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 High cholesterol   . Presence of permanent cardiac pacemaker   . Paroxysmal a-fib: Remote hx of afib 06/15/2015    Past Surgical History  Procedure Laterality Date  . Neck surgery      x 2 for spur and disc problems  . Colonoscopy    . Colonoscopy N/A 05/08/2013    Procedure: COLONOSCOPY;  Surgeon: Malissa Hippo, MD;  [facility];  Service: Endoscopy;  Laterality: N/A;  1015    There were no vitals filed for this visit.  Visit Diagnosis: Aphasia following cerebral infarction - Plan: SLP plan of care cert/re-cert  Cognitive deficit due to recent stroke - Plan: SLP plan of care cert/re-cert          SLP Evaluation [facility] - 07/07/15 1545    SLP Visit Information   SLP Received On 07/07/15   Onset Date 06/13/2015   Subjective   Subjective "I have a hard time saying it."   Patient/Family Stated Goal "getting back to where I was"   Pain Assessment   Currently in Pain? No/denies   Pain Score 0-No pain   General Information   Other Pertinent Information Holly Sparks is a 74 y.o. female with a history of diabetes mellitus, hypertension and hyperlipidemia who takes aspirin 81 mg on a daily  basis. She presented to ER on 06/13/2015 with expressive and receptive aphasia. CT was negative for actue findings but MRI showed:Acute left MCA territory infarct, most confluent involvement in the left basal ganglia. There is basal ganglia petechial hemorrhage, but no significant mass effect. She did not receive IV t-PAdue to late presentation. MBS completed 7/18 and recommended Dys. 3 textures with nectar-thick liquids. Patient transferred to CIR on 7/19 and remained until discharge home on 06/27/2015. A repeat MBSS was completed 06/23/2015 prior to D/C with ongoing recommendation for D3/NTL. Chin tuck eliminated aspiration, however pt was inconsistent with implementation. She made very good progress in CIR, but continues to present with aphasia and cogntive deficits. Pt referred for outpatient SLP therapy to address cognitive linguistic deficits.    Behavioral/Cognition different from baseline   Mobility Status ambulatory   Prior Functional Status   Cognitive/Linguistic Baseline Within functional limits   Type of Home House    Lives With Spouse   Available Support Family   Education attended [employer]   Vocation Retired   Pain Assessment   Pain Assessment No/denies pain   Cognition   Overall Cognitive Status Impaired/Different from baseline   Area of Impairment Orientation;Memory;Following commands   Orientation Level Time   Memory Decreased short-term  memory   Following Commands Follows one step commands consistently;Follows multi-step commands inconsistently   Memory Impaired   Memory Impairment Retrieval deficit   Awareness Impaired   Awareness Impairment Anticipatory impairment   Problem Solving Impaired   Problem Solving Impairment Verbal complex   Executive Function Self Monitoring   Auditory Comprehension   Overall Auditory Comprehension Impaired   Yes/No Questions Impaired   Basic Biographical Questions 76-100% accurate   Basic Immediate Environment Questions 75-100%  accurate   Complex Questions 75-100% accurate   Commands Impaired   One Step Basic Commands 75-100% accurate   Two Step Basic Commands 50-74% accurate   Conversation Moderately complex   Interfering Components Attention   EffectiveTechniques Visual/Gestural cues;Repetition   Visual Recognition/Discrimination   Discrimination Not tested   Reading Comprehension   Reading Status Not tested   Expression   Primary Mode of Expression Verbal   Verbal Expression   Overall Verbal Expression Impaired   Initiation No impairment   Automatic Speech Name;Social Response   Level of Generative/Spontaneous Verbalization Phrase;Sentence   Repetition Impaired   Level of Impairment Sentence level   Naming Impairment   Responsive 51-75% accurate   Confrontation 50-74% accurate   Convergent 75-100% accurate   Divergent 0-24% accurate   Verbal Errors Perseveration   Pragmatics No impairment   Non-Verbal Means of Communication Not applicable   Written Expression   Dominant Hand Right   Written Expression Exceptions to [facility]   Self Formulation Ability Phrase   Oral Motor/Sensory Function   Overall Oral Motor/Sensory Function Appears within functional limits for tasks assessed   Labial ROM Reduced right  mild right weakness   Motor Speech   Overall Motor Speech Appears within functional limits for tasks assessed   Respiration Within functional limits   Phonation Normal   Resonance Within functional limits   Articulation Within functional limitis   Intelligibility Intelligible   Motor Planning Witnin functional limits   Motor Speech Errors Not applicable   Phonation WFL   Standardized Assessments   Standardized Assessments  --  informal measures   Assessment   Therapy Diagnosis Aphasia;Cognitive Impairments   Clinical Impression Statement Pt presents with moderate expressive and mild/mod receptive aphasia which also negatively impacts cognitive function and higher level thinking tasks. Pt has  made good progress in her recovery however she is still requiring a supervised environment and assist for communication. Pt has difficulty following 2-step directions and answering moderately complex yes/no questions. She communicates verbally in short phrases and sentences, but has difficulty with naming and responding to open ended questions. Additionally, she requires mod assist for orientation and working memory. Recommend skilled outpatient SLP intervention to maximize cognitive communication abilities, increase independence, and decrease burden of care. Pt and spouse are highly motivated to address goals.    SLP Recommendation/Assessment Patient needs continued Speech Lanaguage Pathology Services   Problem List Auditory comprehension;Reading comprehension;Written expression;Verbal expression;Orientation;Memory;Problem Solving;Executive Functioning;Thought organization   Type of Aphasia Global   Individuals Consulted   Consulted and Agree with Results and Recommendations Patient;Family member/caregiver   Family Member Consulted  husband                          SLP Education - 07/07/15 1601    Education provided Yes   Education Details implementation of memory folder with calendar and exercises   Person(s) Educated Patient;Spouse   Methods Explanation;Demonstration;Handout   Comprehension Verbalized understanding;Need further instruction  SLP Short Term Goals - 25-Jul-2015 1605    SLP SHORT TERM GOAL #1   Title Pt will complete moderate level divergent naming tasks with 80% acc when provided mild/mod cues.   Baseline 25%   Time 4   Period Weeks   Status New   SLP SHORT TERM GOAL #2   Title Pt will produce 5-7 word sentences when describing pictures and given mi/mod cues.   Baseline mod/max cues   Time 4   Period Weeks   Status New   SLP SHORT TERM GOAL #3   Title Pt will be able to name family members and close friends with 100% acc with use of written  cues prn.   Baseline 50% mod cues without written assist   Time 4   Period Weeks   Status New   SLP SHORT TERM GOAL #4   Title Pt will be oriented to time, place, and situation with 100% acc when cued to use memory book to locate information.   Baseline 25%   Time 4   Period Weeks   Status New   SLP SHORT TERM GOAL #5   Title Pt will participate in ongoing evaluation of auditory and reading comprehension skills as well as follow up of previous dysphagia guidelines/recommendations (was on D3/NTL)   Time 4   Period Weeks   Status New          SLP Long Term Goals - 07/25/2015 1612    SLP LONG TERM GOAL #1   Title Pt will increase cognitive communication skills to [facility] for intermittently supervised environment with use of strategies as needed.    Baseline mod assist in supervised environment   Time 8   Period Weeks   Status New          Plan - 2015-07-25 1603    Clinical Impression Statement Pt presents with moderate expressive and mild/mod receptive aphasia which also negatively impacts cognitive function and higher level thinking tasks. Pt has made good progress in her recovery however she is still requiring a supervised environment and assist for communication. Pt has difficulty following 2-step directions and answering moderately complex yes/no questions. She communicates verbally in short phrases and sentences, but has difficulty with naming and responding to open ended questions. Additionally, she requires mod assist for orientation and working memory. Recommend skilled outpatient SLP intervention to maximize cognitive communication abilities, increase independence, and decrease burden of care. Pt and spouse are highly motivated to address goals.    Speech Therapy Frequency 2x / week   Duration --  8 weeks   Treatment/Interventions SLP instruction and feedback;Cueing hierarchy;Cognitive reorganization;Compensatory strategies;Patient/family education;Functional tasks;Multimodal  communcation approach;Compensatory techniques   Potential to Achieve Goals Good   Potential Considerations Other (comment)  Pt somewhat anxious   SLP Home Exercise Plan Pt will complete HEP as assigned to faciliate carryover of treatment strategies and techniques in home/community environment   Consulted and Agree with Plan of Care Patient;Family member/caregiver   Family Member Consulted husband          G-Codes - 07-25-15 1615    Functional Assessment Tool Used clinical judgment   Functional Limitations Spoken language expressive   Spoken Language Expression Current Status [phone]) At least 40 percent but less than 60 percent impaired, limited or restricted   Spoken Language Expression Goal Status ([id number]) At least 1 percent but less than 20 percent impaired, limited or restricted      Problem List Patient Active Problem List   Diagnosis  Date Noted  . 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 Paroxysmal a-fib: Remote hx of afib 06/15/2015  . Cerebral infarction due to embolism of left middle cerebral artery   . Essential hypertension   . Stroke-like symptoms 06/13/2015  . HLD (hyperlipidemia) 06/13/2015  . DDD (degenerative disc disease), cervical 06/13/2015  . Elevated serum creatinine 06/13/2015  . Aphasia 06/13/2015  . Expressive aphasia   . Cerebral infarction due to unspecified mechanism   . Type 2 diabetes mellitus without complication   . IBS (irritable bowel syndrome) 04/24/2013  . High cholesterol 04/24/2013  . Diabetes 04/24/2013  . Essential hypertension, benign 04/24/2013   Thank you,  Havery Moros, CCC-SLP [phone]  [facility] 07/07/2015, 4:18 PM  [facility] [address] [place], [place], [address] Phone: [phone]   Fax:  [phone]

## 2015-07-07 NOTE — Therapy (Signed)
[place] [address] [place], [place], [address] Phone: [phone]   Fax:  [phone]  Physical Therapy Evaluation  Patient Details  Name: Holly Sparks MRN: [id number] Date of Birth: Apr 11, 1941 Referring Provider:  Erick Colace, MD  Encounter Date: 07/07/2015      PT End of Session - 07/07/15 1025    Visit Number 1   Number of Visits 18   Date for PT Re-Evaluation 08/06/15   Authorization Type UHC medicare   Authorization - Visit Number 1   Authorization - Number of Visits 10   PT Start Time [phone]   PT Stop Time 1015   PT Time Calculation (min) 41 min   Equipment Utilized During Treatment Gait belt   Activity Tolerance Patient limited by fatigue   Behavior During Therapy [facility] for tasks assessed/performed      Past Medical History  Diagnosis Date  . Hypertension     x 10 yrs  . 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 High cholesterol   . Presence of permanent cardiac pacemaker   . Paroxysmal a-fib: Remote hx of afib 06/15/2015    Past Surgical History  Procedure Laterality Date  . Neck surgery      x 2 for spur and disc problems  . Colonoscopy    . Colonoscopy N/A 05/08/2013    Procedure: COLONOSCOPY;  Surgeon: Holly Hippo, MD;  [facility];  Service: Endoscopy;  Laterality: N/A;  1015    There were no vitals filed for this visit.  Visit Diagnosis:  Abnormality of gait  Weakness of both legs  Poor balance  Decreased functional activity tolerance      Subjective Assessment - 07/07/15 0934    Subjective Holly Sparks states that since she has been home she has noticed that she seems to walk slower and she is more off balance than she was before.  She comes to the department walking slowly without an assistive device and does not appear to be stable when walking.  Therapist explained that she really should be be using her cane until she can improve in her balance and strength.    Pertinent History  Information is obtained from chart review: Pt had CVA on 06/13/2015 was discharged to IP rehabiliation on 06/16/2015 and discharged  on 06/27/2015 and is now being referred to out  PT to maximize her functional ability.    How long can you sit comfortably? no problem    How long can you stand comfortably? 15-20 minutes    How long can you walk comfortably? ten minutes and then she is tired.    Patient Stated Goals To be able to walk easier, have more energy, be more active.    Currently in Pain? No/denies            [facility] PT Assessment - 07/07/15 0001    Assessment   Medical Diagnosis Lt MCA CVA with difficulty walking   Onset Date/Surgical Date 06/13/15   Prior Therapy IP rehab until 06/27/2015   Precautions   Precautions Fall   Restrictions   Weight Bearing Restrictions No   Balance Screen   Has the patient fallen in the past 6 months Yes   How many times? 1   Has the patient had a decrease in activity level because of a fear of falling?  Yes   Is the patient reluctant to leave their home because of a fear of  falling?  Yes   Home Environment   Living Environment Private residence   Type of Home House   Home Access Stairs to enter   Entrance Stairs-Number of Steps 1   Prior Function   Level of Independence Independent   Vocation Retired   Leisure active in church,    Cognition   Overall Cognitive Status Within Functional Limits for tasks assessed   Observation/Other Assessments   Focus on Therapeutic Outcomes (FOTO)  68   Functional Tests   Functional tests Single leg stance;Sit to Stand   Single Leg Stance   Comments Rt 4 secondes Lt 3 seconds    Sit to Stand   Comments 4 in 30 seconds    ROM / Strength   AROM / PROM / Strength Strength   Strength   Strength Assessment Site Hip;Knee;Ankle   Right/Left Hip Right;Left   Right Hip Flexion 3/5   Right Hip Extension 2/5   Right Hip ABduction 3/5   Left Hip Flexion 3/5   Left Hip Extension 3/5   Left Hip ABduction 3+/5    Right/Left Knee Right;Left   Right Knee Flexion 3/5   Right Knee Extension 3+/5   Left Knee Flexion 4/5   Left Knee Extension 4-/5   Right/Left Ankle Right;Left   Right Ankle Dorsiflexion 3/5   Left Ankle Dorsiflexion 4-/5   Timed Up and Go Test   Normal TUG (seconds) 21                   [facility] Adult PT Treatment/Exercise - 07/07/15 0001    Exercises   Exercises Knee/Hip   Knee/Hip Exercises: Standing   Heel Raises Both;10 reps   Knee/Hip Exercises: Seated   Long Arc Quad Both;5 reps   Other Seated Knee/Hip Exercises ankle DF/PF x 10    Knee/Hip Exercises: Supine   Bridges 5 reps   Straight Leg Raises Both;5 reps   Knee/Hip Exercises: Sidelying   Hip ABduction Both;5 reps   Knee/Hip Exercises: Prone   Hamstring Curl 5 reps                PT Education - 07/07/15 1024    Education provided Yes   Education Details strengthening exercises    Person(s) Educated Patient   Methods Explanation;Demonstration;Verbal cues;Handout   Comprehension Verbalized understanding;Returned demonstration          PT Short Term Goals - 07/07/15 1034    PT SHORT TERM GOAL #1   Title I HEP   Time 2   Period Days   PT SHORT TERM GOAL #2   Title Pt to be able to complete 6 Sit to stand in 30 seconds to demonstate improved power for easier sit to stand    Time 2   Period Weeks   PT SHORT TERM GOAL #3   Title Pt to be ambulating 20 minutes at a time for improved health habits.   Time 2   Period Weeks   PT SHORT TERM GOAL #4   Title Pt TUG to be  17 or less to reduce risk of falls    Time 3   Period Weeks           PT Long Term Goals - 07/07/15 1036    PT LONG TERM GOAL #1   Title I in advance HEP   Time 6   Period Weeks   PT LONG TERM GOAL #2   Title Pt to show no unsteadiness when ambulating without assistive  device including turning head and turns   Time 6   Period Weeks   PT LONG TERM GOAL #3   Title Pt to be walking for 30 minutes a day for better  health habitis   Time 6   Period Weeks   PT LONG TERM GOAL #4   Title Pt strength to be improved one grade to allow pt to squat to ground and retrun without difficutly   Time 6   Period Weeks               Plan - 07-31-15 1028    Clinical Impression Statement Holly Sparks is a 74 yo female who had a LT CVA affecting her balance and strength on 06/13/2015.  She was discharged from IP rehabilitation on 06/27/2015 and is now being referred to out-patient Pt to maximize her functional ability.  Examination demonstrates decreased balance, decreased strength, abnormal gait making Ms. Bartolomei a fall risk.  She will benefit from skilled physcial therapy to address these issues and maximize her functional potentia   Pt will benefit from skilled therapeutic intervention in order to improve on the following deficits Abnormal gait;Decreased activity tolerance;Decreased balance;Decreased cognition;Decreased safety awareness;Decreased strength;Difficulty walking   Rehab Potential Good   PT Frequency 3x / week   PT Duration 6 weeks   PT Treatment/Interventions ADLs/Self Care Home Management;Gait training;Stair training;Functional mobility training;Therapeutic activities;Therapeutic exercise;Balance training;Patient/family education   PT Next Visit Plan Begin pt with heel raises, functional squats, tandem gt, retro gt, gt training with increased cadance, SLS, sit to stand progress to lunges, balance beam and stepping over hurdles as well as prone exerecises.    PT Home Exercise Plan given   Consulted and Agree with Plan of Care Patient          G-Codes - 2015-07-31 1038    Functional Assessment Tool Used foto    Functional Limitation Mobility: Walking and moving around   Mobility: Walking and Moving Around Current Status [phone]) At least 20 percent but less than 40 percent impaired, limited or restricted   Mobility: Walking and Moving Around Goal Status [phone]) At least 1 percent but less than 20 percent  impaired, limited or restricted       Problem List Patient Active Problem List   Diagnosis Date Noted  . 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 Paroxysmal a-fib: Remote hx of afib 06/15/2015  . Cerebral infarction due to embolism of left middle cerebral artery   . Essential hypertension   . Stroke-like symptoms 06/13/2015  . HLD (hyperlipidemia) 06/13/2015  . DDD (degenerative disc disease), cervical 06/13/2015  . Elevated serum creatinine 06/13/2015  . Aphasia 06/13/2015  . Expressive aphasia   . Cerebral infarction due to unspecified mechanism   . Type 2 diabetes mellitus without complication   . IBS (irritable bowel syndrome) 04/24/2013  . High cholesterol 04/24/2013  . Diabetes 04/24/2013  . Essential hypertension, benign 04/24/2013    Virgina Organ, PT CLT [phone] Jul 31, 2015, 10:41 AM  [facility] [address] [place], [place], [address] Phone: [phone]   Fax:  [phone]

## 2015-07-07 NOTE — Patient Instructions (Signed)
Heel Raise (Sitting)   Raise heels, keeping toes on floor. Repeat __10__ times per set. Do ___1_ sets per session. Do __3__ sessions per day.  http://orth.exer.us/44   Copyright  VHI. All rights reserved.  Toe Raise (Sitting)   Raise toes, keeping heels on floor. Repeat __15__ times per set. Do __1__ sets per session. Do __3__ sessions per day.  http://orth.exer.us/46   Copyright  VHI. All rights reserved.  Knee Extension (Sitting)   Place __2__ pound weight on left ankle and straighten knee fully, lower slowly. Repeat __10__ times per set. Do _1___ sets per session. Do _2___ sessions per day.  http://orth.exer.us/732   Copyright  VHI. All rights reserved.  Strengthening: Straight Leg Raise (Phase 1)   Tighten muscles on front of right thigh, then lift leg _18___ inches from surface, keeping knee locked.  Repeat ___10_ times per set. Do ____ sets per session. Do __2__ sessions per day. 1 http://orth.exer.us/614   Copyright  VHI. All rights reserved.  Bridging   Slowly raise buttocks from floor, keeping stomach tight. Repeat ___10_ times per set. Do __1__ sets per session. Do _2___ sessions per day.  http://orth.exer.us/1096   Copyright  VHI. All rights reserved.  Strengthening: Hip Abduction (Side-Lying)   Tighten muscles on front of left thigh, then lift leg _18___ inches from surface, keeping knee locked.  Repeat __10__ times per set. Do ___1_ sets per session. Do ____2 sessions per day.  http://orth.exer.us/622   Copyright  VHI. All rights reserved.  Heel Raise: Bilateral (Standing)  Holding onto the  kitchen sink Rise on balls of feet. Repeat __10__ times per set. Do _1___ sets per session. Do __2__ sessions per day.  http://orth.exer.us/38   Copyright  VHI. All rights reserved.

## 2015-07-09 ENCOUNTER — Ambulatory Visit (HOSPITAL_COMMUNITY): Payer: Medicare Other | Admitting: Physical Therapy

## 2015-07-14 ENCOUNTER — Ambulatory Visit (HOSPITAL_COMMUNITY): Payer: Medicare Other | Admitting: Physical Therapy

## 2015-07-14 DIAGNOSIS — R6889 Other general symptoms and signs: Secondary | ICD-10-CM

## 2015-07-14 DIAGNOSIS — R2689 Other abnormalities of gait and mobility: Secondary | ICD-10-CM

## 2015-07-14 DIAGNOSIS — R29898 Other symptoms and signs involving the musculoskeletal system: Secondary | ICD-10-CM

## 2015-07-14 DIAGNOSIS — R269 Unspecified abnormalities of gait and mobility: Secondary | ICD-10-CM | POA: Diagnosis not present

## 2015-07-14 NOTE — Therapy (Signed)
[facility] [address] [place], [place], [address] Phone: [phone]   Fax:  [phone]  Physical Therapy Treatment  Patient Details  Name: Holly Sparks MRN: [id number] Date of Birth: 01/20/1941 Referring Provider:  Gareth Morgan, MD  Encounter Date: 07/14/2015      PT End of Session - 07/14/15 1800    Visit Number 2   Number of Visits 18   Date for PT Re-Evaluation 08/06/15   Authorization Type U[employer] medicare   Authorization - Visit Number 2   Authorization - Number of Visits 10   PT Start Time 1603   PT Stop Time 1645   PT Time Calculation (min) 42 min   Activity Tolerance Patient tolerated treatment well;Patient limited by fatigue   Behavior During Therapy [facility] for tasks assessed/performed      Past Medical History  Diagnosis Date  . Hypertension     x 10 yrs  . 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 High cholesterol   . Presence of permanent cardiac pacemaker   . Paroxysmal a-fib: Remote hx of afib 06/15/2015    Past Surgical History  Procedure Laterality Date  . Neck surgery      x 2 for spur and disc problems  . Colonoscopy    . Colonoscopy N/A 05/08/2013    Procedure: COLONOSCOPY;  Surgeon: Malissa Hippo, MD;  [facility];  Service: Endoscopy;  Laterality: N/A;  1015    There were no vitals filed for this visit.  Visit Diagnosis:  Abnormality of gait  Weakness of both legs  Poor balance  Decreased functional activity tolerance      Subjective Assessment - 07/14/15 1609    Subjective Patient reports she is not having any pain but is feeling very fatigued today; also reports that she has not been keeping up with HEP due to fatigue. Arrived still not using assistive device.    Pertinent History Information is obtained from chart review: Pt had CVA on 06/13/2015 was discharged to IP rehabiliation on 06/16/2015 and discharged  on 06/27/2015 and is now being referred to out  PT to maximize her  functional ability.    Currently in Pain? No/denies                         [facility] Adult PT Treatment/Exercise - 07/14/15 0001    Ambulation/Gait   Gait Comments Gait training with SBQC and SPC; Mod cues for proper sequencing with device and improved gait mechanics    Knee/Hip Exercises: Standing   Other Standing Knee Exercises Sit to stand 1x6 with cues for technique and safety, proper form 1x6, B UE assist    Knee/Hip Exercises: Seated   Long Arc Quad Both;1 set;10 reps   Long Arc Quad Limitations on air pad, B UE support    Other Seated Knee/Hip Exercises seated marches on air pad 1x10, cues for form, B UE support ; forward reaching and rotation tasks with cones in order to assist in gross functional mobiltiy    Knee/Hip Exercises: Supine   Bridges Both;1 set;10 reps   Bridges Limitations Min(A)   Straight Leg Raises Both;1 set;10 reps   Other Supine Knee/Hip Exercises Supine marches 1x20    Other Supine Knee/Hip Exercises Supine hip ABD due to difficulty with sidelying 1x10   Knee/Hip Exercises: Sidelying   Hip ABduction --   Clams --  PT Education - 07/14/15 1759    Education provided Yes   Education Details encouraged patient to consider regularly using assistive device to prevent falls due to instability; educated regarding improtance of HEP    Person(s) Educated Patient   Methods Explanation   Comprehension Verbalized understanding          PT Short Term Goals - 07/07/15 1034    PT SHORT TERM GOAL #1   Title I HEP   Time 2   Period Days   PT SHORT TERM GOAL #2   Title Pt to be able to complete 6 Sit to stand in 30 seconds to demonstate improved power for easier sit to stand    Time 2   Period Weeks   PT SHORT TERM GOAL #3   Title Pt to be ambulating 20 minutes at a time for improved health habits.   Time 2   Period Weeks   PT SHORT TERM GOAL #4   Title Pt TUG to be  17 or less to reduce risk of falls    Time 3   Period  Weeks           PT Long Term Goals - 07/07/15 1036    PT LONG TERM GOAL #1   Title I in advance HEP   Time 6   Period Weeks   PT LONG TERM GOAL #2   Title Pt to show no unsteadiness when ambulating without assistive device including turning head and turns   Time 6   Period Weeks   PT LONG TERM GOAL #3   Title Pt to be walking for 30 minutes a day for better health habitis   Time 6   Period Weeks   PT LONG TERM GOAL #4   Title Pt strength to be improved one grade to allow pt to squat to ground and retrun without difficutly   Time 6   Period Weeks               Plan - 07/14/15 1800    Clinical Impression Statement Patient arrived today stating she was very fatigued and that she had been nauseated earlier; adjusted treatment per patient status today. Patient displays significant weakness in proximal musculature, reduced functional activity tolerance, and unsteadiness with gait. Focused on functional mobility, proximal strengthening, and gait training with AD for safety/reduced risk of falls today. Encouraged patient to bring personal AD next session, also urged patient to perfrom HEP as tolerated every day.    Pt will benefit from skilled therapeutic intervention in order to improve on the following deficits Abnormal gait;Decreased activity tolerance;Decreased balance;Decreased cognition;Decreased safety awareness;Decreased strength;Difficulty walking   Rehab Potential Good   PT Frequency 3x / week   PT Duration 6 weeks   PT Treatment/Interventions ADLs/Self Care Home Management;Gait training;Stair training;Functional mobility training;Therapeutic activities;Therapeutic exercise;Balance training;Patient/family education   PT Next Visit Plan Continue functional strengthening as tolerated; technique and safety with functional mobility; balance work; gait training    PT Home Exercise Plan given   Consulted and Agree with Plan of Care Patient        Problem List Patient  Active Problem List   Diagnosis Date Noted  . 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 Paroxysmal a-fib: Remote hx of afib 06/15/2015  . Cerebral infarction due to embolism of left middle cerebral artery   . Essential hypertension   . Stroke-like symptoms 06/13/2015  . HLD (hyperlipidemia) 06/13/2015  . DDD (degenerative disc disease), cervical 06/13/2015  .  Elevated serum creatinine 06/13/2015  . Aphasia 06/13/2015  . Expressive aphasia   . Cerebral infarction due to unspecified mechanism   . Type 2 diabetes mellitus without complication   . IBS (irritable bowel syndrome) 04/24/2013  . High cholesterol 04/24/2013  . Diabetes 04/24/2013  . Essential hypertension, benign 04/24/2013    Nedra Hai PT, DPT [phone]  [facility] [address] [place], [place], [address] Phone: [phone]   Fax:  [phone]

## 2015-07-20 ENCOUNTER — Encounter (HOSPITAL_COMMUNITY): Payer: Medicare Other | Admitting: Speech Pathology

## 2015-07-22 ENCOUNTER — Ambulatory Visit (HOSPITAL_COMMUNITY): Payer: Medicare Other | Admitting: Physical Therapy

## 2015-07-22 DIAGNOSIS — R2689 Other abnormalities of gait and mobility: Secondary | ICD-10-CM

## 2015-07-22 DIAGNOSIS — R269 Unspecified abnormalities of gait and mobility: Secondary | ICD-10-CM | POA: Diagnosis not present

## 2015-07-22 DIAGNOSIS — R6889 Other general symptoms and signs: Secondary | ICD-10-CM

## 2015-07-22 DIAGNOSIS — R29898 Other symptoms and signs involving the musculoskeletal system: Secondary | ICD-10-CM

## 2015-07-22 NOTE — Therapy (Signed)
[facility] [address] [place], [place], [address] Phone: [phone]   Fax:  [phone]  Physical Therapy Treatment  Patient Details  Name: Holly Sparks MRN: [id number] Date of Birth: 07-20-1941 Referring Provider:  Gareth Morgan, MD  Encounter Date: 07/22/2015      PT End of Session - 07/22/15 1516    Visit Number 3   Number of Visits 18   Date for PT Re-Evaluation 08/06/15   Authorization Type [employer] medicare   Authorization - Visit Number 3   Authorization - Number of Visits 10   PT Start Time 1430   PT Stop Time 1515   PT Time Calculation (min) 45 min   Equipment Utilized During Treatment Gait belt   Activity Tolerance Patient tolerated treatment well   Behavior During Therapy [facility] for tasks assessed/performed      Past Medical History  Diagnosis Date  . Hypertension     x 10 yrs  . 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 High cholesterol   . Presence of permanent cardiac pacemaker   . Paroxysmal a-fib: Remote hx of afib 06/15/2015    Past Surgical History  Procedure Laterality Date  . Neck surgery      x 2 for spur and disc problems  . Colonoscopy    . Colonoscopy N/A 05/08/2013    Procedure: COLONOSCOPY;  Surgeon: Malissa Hippo, MD;  [facility];  Service: Endoscopy;  Laterality: N/A;  1015    There were no vitals filed for this visit.  Visit Diagnosis:  Abnormality of gait  Weakness of both legs  Poor balance  Decreased functional activity tolerance      Subjective Assessment - 07/22/15 1435    Subjective Pt reports that she had a fall this morning at the doctor's office, is unable to express exactly how she fell. She reports that she landed on her buttocks when she fell, denies any sharp pain, but reports that she is a little sore. She has not been using AD when ambulating, and has not been doing her HEP.    Currently in Pain? No/denies                 [facility] Adult PT  Treatment/Exercise - 07/22/15 0001    Knee/Hip Exercises: Standing   Heel Raises Both;10 reps   Hip Flexion 10 reps   Hip Abduction 10 reps   Forward Step Up 10 reps;Hand Hold: 2;Step Height: 4"   Other Standing Knee Exercises tap up at 6" box x 10 bilat with no UE support   Knee/Hip Exercises: Seated   Long Arc Quad Both;1 set;10 reps   Sit to [employer] 10 reps;without UE support   Knee/Hip Exercises: Supine   Bridges 10 reps   Straight Leg Raises Both;1 set;10 reps   Knee/Hip Exercises: Sidelying   Clams 10 reps             Balance Exercises - 07/22/15 1510    Balance Exercises: Standing   SLS Eyes open  unable to complete   Standing, One Foot on a Step Eyes open;4 inch;30 secs;2 reps   Rockerboard 30 seconds  max verbal cueing             PT Short Term Goals - 07/07/15 1034    PT SHORT TERM GOAL #1   Title I HEP   Time 2   Period Days   PT SHORT TERM GOAL #2  Title Pt to be able to complete 6 Sit to stand in 30 seconds to demonstate improved power for easier sit to stand    Time 2   Period Weeks   PT SHORT TERM GOAL #3   Title Pt to be ambulating 20 minutes at a time for improved health habits.   Time 2   Period Weeks   PT SHORT TERM GOAL #4   Title Pt TUG to be  17 or less to reduce risk of falls    Time 3   Period Weeks           PT Long Term Goals - 07/07/15 1036    PT LONG TERM GOAL #1   Title I in advance HEP   Time 6   Period Weeks   PT LONG TERM GOAL #2   Title Pt to show no unsteadiness when ambulating without assistive device including turning head and turns   Time 6   Period Weeks   PT LONG TERM GOAL #3   Title Pt to be walking for 30 minutes a day for better health habitis   Time 6   Period Weeks   PT LONG TERM GOAL #4   Title Pt strength to be improved one grade to allow pt to squat to ground and retrun without difficutly   Time 6   Period Weeks               Plan - 07/22/15 1518    Clinical Impression Statement Pt  presented to PT without AD again. She was reminded by PT that gait training was completed with [facility] successfully last treatment, but pt continued to state that she did not feel like she needed to use a cane. Due to c/o soreness in low back/buttocks, palpation was performed on structures in low back/SI region to rule out serious injury. Pt denied any pain with palpation. Treatment focused on functional strengthening and balance training, with max verbal and tactile cueing given for all therex for proper form and remain on task. Pt was unable to maintain SLS at all one either LE in today's treatment, and had difficulty completing other balance activities without assist from PT. Pt was educated regarding safety with ambulation and transfers.    PT Next Visit Plan Continue functional strengthening and balance training, reintroduce gait training next session        Problem List Patient Active Problem List   Diagnosis Date Noted  . 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 Paroxysmal a-fib: Remote hx of afib 06/15/2015  . Cerebral infarction due to embolism of left middle cerebral artery   . Essential hypertension   . Stroke-like symptoms 06/13/2015  . HLD (hyperlipidemia) 06/13/2015  . DDD (degenerative disc disease), cervical 06/13/2015  . Elevated serum creatinine 06/13/2015  . Aphasia 06/13/2015  . Expressive aphasia   . Cerebral infarction due to unspecified mechanism   . Type 2 diabetes mellitus without complication   . IBS (irritable bowel syndrome) 04/24/2013  . High cholesterol 04/24/2013  . Diabetes 04/24/2013  . Essential hypertension, benign 04/24/2013    Leona Singleton, PT, DPT [phone] 07/22/2015, 3:26 PM  [facility] [address] [place], [place], [address] Phone: [phone]   Fax:  [phone]

## 2015-07-24 ENCOUNTER — Ambulatory Visit (HOSPITAL_COMMUNITY): Payer: Medicare Other | Admitting: Physical Therapy

## 2015-07-24 DIAGNOSIS — R6889 Other general symptoms and signs: Secondary | ICD-10-CM

## 2015-07-24 DIAGNOSIS — R2689 Other abnormalities of gait and mobility: Secondary | ICD-10-CM

## 2015-07-24 DIAGNOSIS — R29898 Other symptoms and signs involving the musculoskeletal system: Secondary | ICD-10-CM

## 2015-07-24 DIAGNOSIS — R269 Unspecified abnormalities of gait and mobility: Secondary | ICD-10-CM | POA: Diagnosis not present

## 2015-07-24 NOTE — Therapy (Signed)
[facility] [address] [place], [place], [address] Phone: [phone]   Fax:  [phone]  Physical Therapy Treatment  Patient Details  Name: Holly Sparks MRN: [id number] Date of Birth: 11/04/1941 Referring Provider:  Gareth Morgan, MD  Encounter Date: 07/24/2015      PT End of Session - 07/24/15 1406    Visit Number 4   Number of Visits 18   Date for PT Re-Evaluation 08/06/15   Authorization Type UHC medicare   Authorization - Visit Number 4   Authorization - Number of Visits 10   PT Start Time 1303   PT Stop Time 1345   PT Time Calculation (min) 42 min   Equipment Utilized During Treatment Gait belt   Activity Tolerance Patient tolerated treatment well      Past Medical History  Diagnosis Date  . Hypertension     x 10 yrs  . 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 High cholesterol   . Presence of permanent cardiac pacemaker   . Paroxysmal a-fib: Remote hx of afib 06/15/2015    Past Surgical History  Procedure Laterality Date  . Neck surgery      x 2 for spur and disc problems  . Colonoscopy    . Colonoscopy N/A 05/08/2013    Procedure: COLONOSCOPY;  Surgeon: Malissa Hippo, MD;  [facility];  Service: Endoscopy;  Laterality: N/A;  1015    There were no vitals filed for this visit.  Visit Diagnosis:  Abnormality of gait  Weakness of both legs  Poor balance  Decreased functional activity tolerance      Subjective Assessment - 07/24/15 1331    Subjective Pt states she is not doing her HEP.  Pt needs to leave early due to a funeral .  Pt fell the other day in the MD's office so she is sore.   Pertinent History Information is obtained from chart review: Pt had CVA on 06/13/2015 was discharged to IP rehabiliation on 06/16/2015 and discharged  on 06/27/2015 and is now being referred to out  PT to maximize her functional ability.    Currently in Pain? No/denies  soreness              [facility] Adult  PT Treatment/Exercise - 07/24/15 0001    Bed Mobility   Supine to Sit 6: Modified independent (Device/Increase time)   Ambulation/Gait   Gait Comments for equalized step length.    Exercises   Exercises Lumbar   Lumbar Exercises: Stretches   Lower Trunk Rotation 5 reps   Lower Trunk Rotation Limitations x3 with UE opposte direction for imporve  trunk rotationl   Lumbar Exercises: Standing   Other Standing Lumbar Exercises cone rotation on foam x 2.   Other Standing Lumbar Exercises tandem gait x 2 RT    Lumbar Exercises: Supine   Dead Bug 10 reps   Dead Bug Limitations for improved cooordination.l   Bridge 15 reps   Knee/Hip Exercises: Standing   Rocker Board 2 minutes   SLS x5   Gait Training for equalized stride length    Other Standing Knee Exercises marching in place                 PT Education - 07/24/15 1405    Education provided Yes   Education Details to take equal stride lengths when ambulating    Person(s) Educated Patient   Methods Explanation;Demonstration   Comprehension  Verbalized understanding;Need further instruction          PT Short Term Goals - 07/07/15 1034    PT SHORT TERM GOAL #1   Title I HEP   Time 2   Period Days   PT SHORT TERM GOAL #2   Title Pt to be able to complete 6 Sit to stand in 30 seconds to demonstate improved power for easier sit to stand    Time 2   Period Weeks   PT SHORT TERM GOAL #3   Title Pt to be ambulating 20 minutes at a time for improved health habits.   Time 2   Period Weeks   PT SHORT TERM GOAL #4   Title Pt TUG to be  17 or less to reduce risk of falls    Time 3   Period Weeks           PT Long Term Goals - 07/07/15 1036    PT LONG TERM GOAL #1   Title I in advance HEP   Time 6   Period Weeks   PT LONG TERM GOAL #2   Title Pt to show no unsteadiness when ambulating without assistive device including turning head and turns   Time 6   Period Weeks   PT LONG TERM GOAL #3   Title Pt to be  walking for 30 minutes a day for better health habitis   Time 6   Period Weeks   PT LONG TERM GOAL #4   Title Pt strength to be improved one grade to allow pt to squat to ground and retrun without difficutly   Time 6   Period Weeks               Plan - 07/24/15 1406    Clinical Impression Statement Pt has abnormal gait with decreased trunk rotation, decreased arm swing, decreased dorsiflexion and decreased plantarflexion. Added new exercises to try and promote trunk rotation as well as coordination.  All exercises facilitated by therapist due to pt having decreased cognitive recall.    Pt will benefit from skilled therapeutic intervention in order to improve on the following deficits Abnormal gait;Decreased activity tolerance;Decreased balance;Decreased cognition;Decreased safety awareness;Decreased strength;Difficulty walking   PT Next Visit Plan begin retro gt; tandem on balance beam         Problem List Patient Active Problem List   Diagnosis Date Noted  . 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 Paroxysmal a-fib: Remote hx of afib 06/15/2015  . Cerebral infarction due to embolism of left middle cerebral artery   . Essential hypertension   . Stroke-like symptoms 06/13/2015  . HLD (hyperlipidemia) 06/13/2015  . DDD (degenerative disc disease), cervical 06/13/2015  . Elevated serum creatinine 06/13/2015  . Aphasia 06/13/2015  . Expressive aphasia   . Cerebral infarction due to unspecified mechanism   . Type 2 diabetes mellitus without complication   . IBS (irritable bowel syndrome) 04/24/2013  . High cholesterol 04/24/2013  . Diabetes 04/24/2013  . Essential hypertension, benign 04/24/2013    Virgina Organ, PT CLT [phone] 07/24/2015, 2:11 PM  [facility] [address] [place], [place], [address] Phone: [phone]   Fax:  [phone]

## 2015-07-27 ENCOUNTER — Ambulatory Visit (HOSPITAL_COMMUNITY): Payer: Medicare Other

## 2015-07-27 DIAGNOSIS — R29898 Other symptoms and signs involving the musculoskeletal system: Secondary | ICD-10-CM

## 2015-07-27 DIAGNOSIS — R52 Pain, unspecified: Secondary | ICD-10-CM

## 2015-07-27 DIAGNOSIS — R269 Unspecified abnormalities of gait and mobility: Secondary | ICD-10-CM

## 2015-07-27 DIAGNOSIS — I69320 Aphasia following cerebral infarction: Secondary | ICD-10-CM

## 2015-07-27 DIAGNOSIS — R0602 Shortness of breath: Secondary | ICD-10-CM

## 2015-07-27 DIAGNOSIS — R2689 Other abnormalities of gait and mobility: Secondary | ICD-10-CM

## 2015-07-27 DIAGNOSIS — I69319 Unspecified symptoms and signs involving cognitive functions following cerebral infarction: Secondary | ICD-10-CM

## 2015-07-27 DIAGNOSIS — R6889 Other general symptoms and signs: Secondary | ICD-10-CM

## 2015-07-27 NOTE — Therapy (Signed)
[facility] [address] [place], [place], [address] Phone: [phone]   Fax:  [phone]  Patient Details  Name: Holly Sparks MRN: [id number] Date of Birth: 1941-05-30 Referring Provider:  Erick Colace, MD  Encounter Date: 07/27/2015  Pt arrived on time, reports that she has been experiencing tummy troubles, including nausea and diarrhea. Pt reports that she is feeling tired, worn out, and is concerned that she remain near a bathroom during treatment. PT recommended canceling this treatment session, in hope to return to home and recover strength. Will followup with pt on Wednesday at scheduled visit time.   Marilyn Nihiser C 07/27/2015, 12:38 PM  12:40 PM  Rosamaria Lints, PT, DPT [place] License # [id number]       [facility] [address] [place], [place], [address] Phone: [phone]   Fax:  [phone]

## 2015-07-29 ENCOUNTER — Ambulatory Visit (HOSPITAL_COMMUNITY): Payer: Medicare Other

## 2015-07-29 DIAGNOSIS — R269 Unspecified abnormalities of gait and mobility: Secondary | ICD-10-CM | POA: Diagnosis not present

## 2015-07-29 DIAGNOSIS — R2689 Other abnormalities of gait and mobility: Secondary | ICD-10-CM

## 2015-07-29 DIAGNOSIS — R29898 Other symptoms and signs involving the musculoskeletal system: Secondary | ICD-10-CM

## 2015-07-29 DIAGNOSIS — R6889 Other general symptoms and signs: Secondary | ICD-10-CM

## 2015-07-29 NOTE — Therapy (Signed)
[facility] [address] [place], [place], [address] Phone: [phone]   Fax:  [phone]  Physical Therapy Treatment  Patient Details  Name: Holly Sparks MRN: [id number] Date of Birth: [birth year] Referring Provider:  Erick Colace, MD  Encounter Date: 07/29/2015      PT End of Session - 07/29/15 1015    Visit Number 5   Number of Visits 18   Date for PT Re-Evaluation 08/06/15   Authorization Type UHC medicare   Authorization - Visit Number 5   Authorization - Number of Visits 10   PT Start Time [phone]   PT Stop Time 1015   PT Time Calculation (min) 42 min   Equipment Utilized During Treatment Gait belt   Activity Tolerance Patient tolerated treatment well   Behavior During Therapy [facility] for tasks assessed/performed      Past Medical History  Diagnosis Date  . Hypertension     x 10 yrs  . 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 High cholesterol   . Presence of permanent cardiac pacemaker   . Paroxysmal a-fib: Remote hx of afib 06/15/2015    Past Surgical History  Procedure Laterality Date  . Neck surgery      x 2 for spur and disc problems  . Colonoscopy    . Colonoscopy N/A 05/08/2013    Procedure: COLONOSCOPY;  Surgeon: Malissa Hippo, MD;  [facility];  Service: Endoscopy;  Laterality: N/A;  1015    There were no vitals filed for this visit.  Visit Diagnosis:  Abnormality of gait  Weakness of both legs  Poor balance  Decreased functional activity tolerance      Subjective Assessment - 07/29/15 0936    Subjective Pt states she is doing well today, feels nervous today.  Stomach is feeling better today.   Currently in Pain? No/denies             [facility] Adult PT Treatment/Exercise - 07/29/15 0001    Exercises   Exercises Lumbar   Lumbar Exercises: Supine   Dead Bug 10 reps   Dead Bug Limitations for improved cooordination.l   Bridge 15 reps             Balance Exercises -  07/29/15 1029    Balance Exercises: Standing   SLS Eyes open   SLS with Vectors --  Rt 22", Lt 13" max of 3   Rockerboard Other (comment);UE support  2 minutes both R/L and A/P   Balance Beam 2RT tandem gait   Retro Gait 2 reps   Sidestepping 2 reps   Cone Rotation Foam/compliant surface;R/L             PT Short Term Goals - 07/07/15 1034    PT SHORT TERM GOAL #1   Title I HEP   Time 2   Period Days   PT SHORT TERM GOAL #2   Title Pt to be able to complete 6 Sit to stand in 30 seconds to demonstate improved power for easier sit to stand    Time 2   Period Weeks   PT SHORT TERM GOAL #3   Title Pt to be ambulating 20 minutes at a time for improved health habits.   Time 2   Period Weeks   PT SHORT TERM GOAL #4   Title Pt TUG to be  17 or less to reduce risk of falls    Time 3  Period Weeks           PT Long Term Goals - 07/07/15 1036    PT LONG TERM GOAL #1   Title I in advance HEP   Time 6   Period Weeks   PT LONG TERM GOAL #2   Title Pt to show no unsteadiness when ambulating without assistive device including turning head and turns   Time 6   Period Weeks   PT LONG TERM GOAL #3   Title Pt to be walking for 30 minutes a day for better health habitis   Time 6   Period Weeks   PT LONG TERM GOAL #4   Title Pt strength to be improved one grade to allow pt to squat to ground and retrun without difficutly   Time 6   Period Weeks               Plan - 07/29/15 1031    Clinical Impression Statement Session focus on balance training to improve gait mechanics.  Added retro gait with cueing and assistance to reduce posterior lean.  Pt educated on importance of proper posture to improve gait mechanics and reduce risk of falls.  Pt does continue to ambulate with decreased trunk rotation, continued cone rotation to improve crossing midline with gait    PT Next Visit Plan begin wall bumps for core activation to reduce posterior lean, encourage larger stride  length with gait, progress balance.          Problem List Patient Active Problem List   Diagnosis Date Noted  . 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 Paroxysmal a-fib: Remote hx of afib 06/15/2015  . Cerebral infarction due to embolism of left middle cerebral artery   . Essential hypertension   . Stroke-like symptoms 06/13/2015  . HLD (hyperlipidemia) 06/13/2015  . DDD (degenerative disc disease), cervical 06/13/2015  . Elevated serum creatinine 06/13/2015  . Aphasia 06/13/2015  . Expressive aphasia   . Cerebral infarction due to unspecified mechanism   . Type 2 diabetes mellitus without complication   . IBS (irritable bowel syndrome) 04/24/2013  . High cholesterol 04/24/2013  . Diabetes 04/24/2013  . Essential hypertension, benign 04/24/2013   Becky Sax, LPTA; CBIS [phone]  Juel Burrow 07/29/2015, 11:01 AM  [facility] [address] [place], [place], [address] Phone: [phone]   Fax:  [phone]

## 2015-07-31 ENCOUNTER — Ambulatory Visit (HOSPITAL_COMMUNITY): Payer: Medicare Other | Admitting: Physical Therapy

## 2015-08-07 ENCOUNTER — Encounter: Payer: Self-pay | Admitting: Cardiovascular Disease

## 2015-08-12 ENCOUNTER — Ambulatory Visit (HOSPITAL_COMMUNITY): Payer: Medicare Other | Attending: Physical Medicine & Rehabilitation | Admitting: Physical Therapy

## 2015-08-12 DIAGNOSIS — R6889 Other general symptoms and signs: Secondary | ICD-10-CM

## 2015-08-12 DIAGNOSIS — R2689 Other abnormalities of gait and mobility: Secondary | ICD-10-CM | POA: Diagnosis present

## 2015-08-12 DIAGNOSIS — I69320 Aphasia following cerebral infarction: Secondary | ICD-10-CM | POA: Insufficient documentation

## 2015-08-12 DIAGNOSIS — R0602 Shortness of breath: Secondary | ICD-10-CM | POA: Insufficient documentation

## 2015-08-12 DIAGNOSIS — I6981 Cognitive deficits following other cerebrovascular disease: Secondary | ICD-10-CM | POA: Diagnosis present

## 2015-08-12 DIAGNOSIS — R269 Unspecified abnormalities of gait and mobility: Secondary | ICD-10-CM | POA: Diagnosis not present

## 2015-08-12 DIAGNOSIS — R29898 Other symptoms and signs involving the musculoskeletal system: Secondary | ICD-10-CM | POA: Diagnosis present

## 2015-08-12 DIAGNOSIS — R52 Pain, unspecified: Secondary | ICD-10-CM | POA: Insufficient documentation

## 2015-08-12 NOTE — Therapy (Signed)
[facility] [address] [place], [place], [address] Phone: [phone]   Fax:  [phone]  Physical Therapy Treatment (Reassessment)  Patient Details  Name: Holly Sparks MRN: [id number] Date of Birth: May 04, 1941 Referring Provider:  Erick Colace, MD  Encounter Date: 08/12/2015      PT End of Session - 08/12/15 1535    Visit Number 6   Number of Visits 18   Date for PT Re-Evaluation 09/09/15   Authorization Type [employer] medicare   Authorization Time Period Gcodes done 6th visit   Authorization - Visit Number 6   Authorization - Number of Visits 16   PT Start Time 1016   PT Stop Time 1100   PT Time Calculation (min) 44 min   Equipment Utilized During Treatment Gait belt   Activity Tolerance Patient tolerated treatment well   Behavior During Therapy WFL for tasks assessed/performed      Past Medical History  Diagnosis Date  . Hypertension     x 10 yrs  . 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 High cholesterol   . Presence of permanent cardiac pacemaker   . Paroxysmal a-fib: Remote hx of afib 06/15/2015    Past Surgical History  Procedure Laterality Date  . Neck surgery      x 2 for spur and disc problems  . Colonoscopy    . Colonoscopy N/A 05/08/2013    Procedure: COLONOSCOPY;  Surgeon: Malissa Hippo, MD;  [facility];  Service: Endoscopy;  Laterality: N/A;  1015    There were no vitals filed for this visit.  Visit Diagnosis:  Abnormality of gait  Weakness of both legs  Poor balance  Decreased functional activity tolerance      Subjective Assessment - 08/12/15 1025    Subjective Pt reports that she has seen improvements in her strength and balance since beginning PT, and overall she feels much better.    How long can you sit comfortably? no problem    How long can you stand comfortably? 15-20 minutes    How long can you walk comfortably? 20 minutes   Currently in Pain? No/denies   Pain Score  0-No pain            [facility] PT Assessment - 08/12/15 0001    Observation/Other Assessments   Focus on Therapeutic Outcomes (FOTO)  61   Single Leg Stance   Comments RLE 9", LLE 4"   Sit to Stand   Comments 4 in 30"   Strength   Right Hip Flexion 3+/5   Right Hip Extension 3-/5   Right Hip ABduction 4-/5   Left Hip Flexion 4/5   Left Hip Extension 3/5   Left Hip ABduction 4/5   Right Knee Flexion 4-/5   Right Knee Extension 4/5   Left Knee Flexion 4-/5   Left Knee Extension 4+/5   Right Ankle Dorsiflexion 4/5   Left Ankle Dorsiflexion 4/5   Timed Up and Go Test   Normal TUG (seconds) 23.21                     [facility] Adult PT Treatment/Exercise - 08/12/15 0001    Ambulation/Gait   Ambulation/Gait Yes   Ambulation/Gait Assistance 5: Supervision   Ambulation Distance (Feet) 225 Feet   Gait Pattern Poor foot clearance - left;Poor foot clearance - right;Decreased arm swing - right;Decreased arm swing - left;Decreased stride length   Knee/Hip  Exercises: Standing   SLS x5                PT Education - 08/12/15 1535    Education provided Yes   Education Details Goals reviewed, educated on foot clearance with ambulation, educated on safety with ambulation   Person(s) Educated Patient   Methods Explanation   Comprehension Verbalized understanding;Need further instruction          PT Short Term Goals - 08/12/15 1046    PT SHORT TERM GOAL #1   Title I HEP   Time 2   Period Weeks   Status On-going   PT SHORT TERM GOAL #2   Title Pt to be able to complete 6 Sit to stand in 30 seconds to demonstate improved power for easier sit to stand    Time 2   Period Weeks   Status On-going   PT SHORT TERM GOAL #3   Title Pt to be ambulating 20 minutes at a time for improved health habits.   Time 2   Period Weeks   Status Achieved   PT SHORT TERM GOAL #4   Title Pt TUG to be  17 or less to reduce risk of falls    Time 3   Period Weeks   Status On-going            PT Long Term Goals - 08/12/15 1047    PT LONG TERM GOAL #1   Title I in advance HEP   Time 6   Period Weeks   Status On-going   PT LONG TERM GOAL #2   Title Pt to show no unsteadiness when ambulating without assistive device including turning head and turns   Time 6   Period Weeks   Status On-going   PT LONG TERM GOAL #3   Title Pt to be walking for 30 minutes a day for better health habitis   Time 6   Period Weeks   Status On-going   PT LONG TERM GOAL #4   Title Pt strength to be improved one grade to allow pt to squat to ground and retrun without difficutly   Time 6   Period Weeks   Status On-going               Plan - 08/12/15 1536    Clinical Impression Statement Reassessment was completed today. Pt has made some improvements in her BLE strength, however, she continues to demonstrate decreased safety awareness and increased fall risk. Pt reports that she is non-compliant with HEP, and she has not been ambulating with a cane, which PT has advised her to do in multiple treatment sessions. Pt believes that since she never ambulates with AD, that it is less safe for her to do so now. Pt education was provided regarding safety with gait and functional activities. Pt will benefit from 3-4 more weeks of skilled services to further address her balance impairment and BLE weakness.    Pt will benefit from skilled therapeutic intervention in order to improve on the following deficits Abnormal gait;Decreased activity tolerance;Decreased balance;Decreased cognition;Decreased safety awareness;Decreased strength;Difficulty walking   PT Frequency 2x / week   PT Duration 4 weeks   PT Treatment/Interventions ADLs/Self Care Home Management;Gait training;Stair training;Functional mobility training;Therapeutic activities;Therapeutic exercise;Balance training;Patient/family education   PT Next Visit Plan begin wall bumps for core activation to reduce posterior lean, encourage  larger stride length and improved foot clearance with gait, progress balance.  G-Codes - 08/12/15 1541    Functional Assessment Tool Used foto    Functional Limitation Mobility: Walking and moving around   Mobility: Walking and Moving Around Current Status [phone]) At least 20 percent but less than 40 percent impaired, limited or restricted   Mobility: Walking and Moving Around Goal Status [phone]) At least 20 percent but less than 40 percent impaired, limited or restricted      Problem List Patient Active Problem List   Diagnosis Date Noted  . 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 Paroxysmal a-fib: Remote hx of afib 06/15/2015  . Cerebral infarction due to embolism of left middle cerebral artery   . Essential hypertension   . Stroke-like symptoms 06/13/2015  . HLD (hyperlipidemia) 06/13/2015  . DDD (degenerative disc disease), cervical 06/13/2015  . Elevated serum creatinine 06/13/2015  . Aphasia 06/13/2015  . Expressive aphasia   . Cerebral infarction due to unspecified mechanism   . Type 2 diabetes mellitus without complication   . IBS (irritable bowel syndrome) 04/24/2013  . High cholesterol 04/24/2013  . Diabetes 04/24/2013  . Essential hypertension, benign 04/24/2013    Leona Singleton, PT, DPT [phone] 08/12/2015, 3:43 PM   [facility] [address] [place], [place], [address] Phone: [phone]   Fax:  [phone]

## 2015-08-14 ENCOUNTER — Encounter: Payer: Medicare Other | Attending: Neurology | Admitting: Nutrition

## 2015-08-14 ENCOUNTER — Encounter: Payer: Self-pay | Admitting: Nutrition

## 2015-08-14 VITALS — Ht 65.0 in | Wt 146.5 lb

## 2015-08-14 DIAGNOSIS — E118 Type 2 diabetes mellitus with unspecified complications: Secondary | ICD-10-CM | POA: Diagnosis present

## 2015-08-14 DIAGNOSIS — IMO0002 Reserved for concepts with insufficient information to code with codable children: Secondary | ICD-10-CM

## 2015-08-14 DIAGNOSIS — E1165 Type 2 diabetes mellitus with hyperglycemia: Secondary | ICD-10-CM

## 2015-08-14 DIAGNOSIS — Z713 Dietary counseling and surveillance: Secondary | ICD-10-CM | POA: Diagnosis not present

## 2015-08-14 DIAGNOSIS — Z794 Long term (current) use of insulin: Secondary | ICD-10-CM | POA: Diagnosis not present

## 2015-08-14 NOTE — Patient Instructions (Signed)
Goals 1. Follow My Plate 2. Eat 45 grams of carbs per meal. 3. Do not skip meals. 4. Notify MD if blood sugars are less than 80 on regular basis. 5. Prevent low blood sugars. 6. Eat 15 g of carbs and 1 oz of protein (peanutbutter sandwich or milk/yogurt/fruit before bed if needed to prevent blood sugars less than 90 in the am.) 7. Get A1C 7% or less. 8. Walk 15 minutes per day if able.

## 2015-08-14 NOTE — Progress Notes (Signed)
Diabetes Self-Management Education  Visit Type: First/Initial  Appt. Start Time: 800 Appt. End Time: 0900  08/14/2015  [name], identified by name and date of birth, is a 74 y.o. female with a diagnosis of Diabetes: Type 2. She lives with her husband who does the cooking. Most foods are baked and broiled. No A1C available. Sees Dr. [name] for PCP. Tires easily and can't eat a lot at one time. Usally eats 30-40 grams of carbs per meal. Taking 14 units of Levemir and Metformin 1000 mg BID. She had a CVA in July. Denies chewing and swallowing issues but getting physical therapy on regular basis. Her husband has been injecting in her arm and suggested to give insulin in abdomen for better absorption. FBS usually 90-100's.  ASSESSMENT  Height  (1.651 m), weight 146 lb 8 oz (66.452 kg). Body mass index is 24.38 kg/(m^2).      Diabetes Self-Management Education - 08/14/15 1718    Psychosocial Assessment   Self-care barriers Other (comment)  CVA   Self-management support Doctor's office;Friends;Family   Special Needs Large print;Verbal instruction;Simplified materials   Preferred Learning Style Auditory;Visual;Hands on   Learning Readiness Ready   How often do you need to have someone help you when you read instructions, pamphlets, or other written materials from your doctor or pharmacy? 3 - Sometimes   Complications   Last HgB A1C per patient/outside source --  Dont  have iinformation   How often do you check your blood sugar? 1-2 times/day   Fasting Blood glucose range (mg/dL) 132-440;10-272   Number of hypoglycemic episodes per month 0   Exercise   Exercise Type ADL's   How many days per week to you exercise? --  Physical therapy   How many minutes per day do you exercise? 30   Patient Education   Disease state  Factors that contribute to the development of diabetes   Nutrition management  Role of diet in the treatment of diabetes and the relationship between the  three main macronutrients and blood glucose level;Carbohydrate counting;Reviewed blood glucose goals for pre and post meals and how to evaluate the patients' food intake on their blood glucose level.;Meal timing in regards to the patients' current diabetes medication.;Meal options for control of blood glucose level and chronic complications.   Medications Reviewed patients medication for diabetes, action, purpose, timing of dose and side effects.;Reviewed medication adjustment guidelines for hyperglycemia and sick days.   Monitoring Purpose and frequency of SMBG.;Taught/discussed recording of test results and interpretation of SMBG.;Interpreting lab values - A1C, lipid, urine microalbumina.;Identified appropriate SMBG and/or A1C goals.;Daily foot exams;Yearly dilated eye exam   Acute complications Taught treatment of hypoglycemia - the 15 rule.;Discussed and identified patients' treatment of hyperglycemia.   Chronic complications Relationship between chronic complications and blood glucose control;Assessed and discussed foot care and prevention of foot problems;Lipid levels, blood glucose control and heart disease;Identified and discussed with patient  current chronic complications;Reviewed with patient heart disease, higher risk of, and prevention   Psychosocial adjustment Worked with patient to identify barriers to care and solutions;Helped patient identify a support system for diabetes management;Identified and addressed patients feelings and concerns about diabetes   Personal strategies to promote health Lifestyle issues that need to be addressed for better diabetes care;Helped patient develop diabetes management plan for (enter comment)   Individualized Goals (developed by patient)   Nutrition Follow meal plan discussed;General guidelines for healthy choices and portions discussed   Physical Activity Exercise 1-2 times per week  Medications take my medication as prescribed   Monitoring  test blood  glucose pre and post meals as discussed;test my blood glucose as discussed   Reducing Risk examine blood glucose patterns;do foot checks daily;increase portions of olive oil in diet;treat hypoglycemia with 15 grams of carbs if blood glucose less than 70mg /dL;increase portions of healthy fats   Health Coping discuss diabetes with (comment)  MD   Outcomes   Expected Outcomes Demonstrated interest in learning. Expect positive outcomes   Future DMSE 4-6 wks   Program Status Completed      Individualized Plan for Diabetes Self-Management Training:   Learning Objective:  Patient will have a greater understanding of diabetes self-management. Patient education plan is to attend individual and/or group sessions per assessed needs and concerns.   Plan:   Patient Instructions  Goals 1. Follow My Plate 2. Eat 45 grams of carbs per meal. 3. Do not skip meals. 4. Notify MD if blood sugars are less than 80 on regular basis. 5. Prevent low blood sugars. 6. Eat 15 g of carbs and 1 oz of protein (peanutbutter sandwich or milk/yogurt/fruit before bed if needed to prevent blood sugars less than 90 in the am.) 7. Get A1C 7% or less. 8. Walk 15 minutes per day if able. 9. Follow the low salt high fiber diet as discussed.   Expected Outcomes:  Demonstrated interest in learning. Expect positive outcomes. Expect improved A1C and reduced cardiovascular risks and complications from DM.Marland Kitchen  Education material provided: Living Well with Diabetes, A1C conversion sheet, My Plate and Carbohydrate counting sheet  If problems or questions, patient to contact team via:  Phone  Future DSME appointment: 4-6 wks

## 2015-08-17 ENCOUNTER — Encounter (HOSPITAL_COMMUNITY): Payer: Medicare Other | Admitting: Speech Pathology

## 2015-08-17 ENCOUNTER — Ambulatory Visit (HOSPITAL_COMMUNITY): Payer: Medicare Other | Admitting: Physical Therapy

## 2015-08-17 DIAGNOSIS — R29898 Other symptoms and signs involving the musculoskeletal system: Secondary | ICD-10-CM

## 2015-08-17 DIAGNOSIS — R269 Unspecified abnormalities of gait and mobility: Secondary | ICD-10-CM

## 2015-08-17 DIAGNOSIS — R6889 Other general symptoms and signs: Secondary | ICD-10-CM

## 2015-08-17 DIAGNOSIS — R2689 Other abnormalities of gait and mobility: Secondary | ICD-10-CM

## 2015-08-17 NOTE — Therapy (Signed)
[place] [facility] [address] [place], [place], [address] Phone: [phone]   Fax:  [phone]  Physical Therapy Treatment  Patient Details  Name: Holly Sparks MRN: [id number] Date of Birth: [birth year] Referring Provider:  Erick Colace, MD  Encounter Date: 08/17/2015      PT End of Session - 08/17/15 1506    Visit Number 7   Number of Visits 18   Date for PT Re-Evaluation 09/09/15   Authorization Type [employer] medicare   Authorization Time Period Gcodes done 6th visit   Authorization - Visit Number 7   Authorization - Number of Visits 18   PT Start Time 1424   PT Stop Time 1505   PT Time Calculation (min) 41 min   Equipment Utilized During Treatment Gait belt   Activity Tolerance Patient tolerated treatment well   Behavior During Therapy WFL for tasks assessed/performed      Past Medical History  Diagnosis Date  . Hypertension     x 10 yrs  . 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 High cholesterol   . Presence of permanent cardiac pacemaker   . Paroxysmal a-fib: Remote hx of afib 06/15/2015    Past Surgical History  Procedure Laterality Date  . Neck surgery      x 2 for spur and disc problems  . Colonoscopy    . Colonoscopy N/A 05/08/2013    Procedure: COLONOSCOPY;  Surgeon: Malissa Hippo, MD;  [facility];  Service: Endoscopy;  Laterality: N/A;  1015    There were no vitals filed for this visit.  Visit Diagnosis:  Abnormality of gait  Weakness of both legs  Poor balance  Decreased functional activity tolerance      Subjective Assessment - 08/17/15 1419    Subjective Pt states that she has not been doing her exericses.     Currently in Pain? No/denies               [facility] Adult PT Treatment/Exercise - 08/17/15 0001    Lumbar Exercises: Aerobic   Stationary Bike nustep hills 2 level 2 x 10:00   Lumbar Exercises: Standing   Heel Raises 10 reps   Heel Raises Limitations toe raises x10   Other Standing Lumbar Exercises 3-D hip excursion;   Other Standing Lumbar Exercises tandem gait x 2 RT              Balance Exercises - 08/17/15 1438    Balance Exercises: Standing   SLS Eyes open   Wall Bumps Shoulder;Hip   Wall Bumps-Shoulders Eyes opened;10 reps   Wall Bumps-Hips Eyes opened;10 reps   Balance Beam 2RT tandem gait   Retro Gait 1 rep   Sidestepping 2 reps   Marching Limitations x10   Sit to Stand Time x10             PT Short Term Goals - 08/12/15 1046    PT SHORT TERM GOAL #1   Title I HEP   Time 2   Period Weeks   Status On-going   PT SHORT TERM GOAL #2   Title Pt to be able to complete 6 Sit to stand in 30 seconds to demonstate improved power for easier sit to stand    Time 2   Period Weeks   Status On-going   PT SHORT TERM GOAL #3   Title Pt to be ambulating 20 minutes at a time for improved health  habits.   Time 2   Period Weeks   Status Achieved   PT SHORT TERM GOAL #4   Title Pt TUG to be  17 or less to reduce risk of falls    Time 3   Period Weeks   Status On-going           PT Long Term Goals - 08/12/15 1047    PT LONG TERM GOAL #1   Title I in advance HEP   Time 6   Period Weeks   Status On-going   PT LONG TERM GOAL #2   Title Pt to show no unsteadiness when ambulating without assistive device including turning head and turns   Time 6   Period Weeks   Status On-going   PT LONG TERM GOAL #3   Title Pt to be walking for 30 minutes a day for better health habitis   Time 6   Period Weeks   Status On-going   PT LONG TERM GOAL #4   Title Pt strength to be improved one grade to allow pt to squat to ground and retrun without difficutly   Time 6   Period Weeks   Status On-going               Plan - 08/17/15 1507    Clinical Impression Statement Added wall bumps as well as sit to stand and nustep to program.  Nustep performed starting at 1510 was not charged for.  Pt continues to have unstable gait and will not  correct step length with verbal cuing.  Pt continuues not to do her HEP nor is she using an assistive device with ambulation.  Will continue to      PT Next Visit Plan focus on balance, increasing trunk rotation and arm swing during ambulation        Problem List Patient Active Problem List   Diagnosis Date Noted  . 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 Paroxysmal a-fib: Remote hx of afib 06/15/2015  . Cerebral infarction due to embolism of left middle cerebral artery   . Essential hypertension   . Stroke-like symptoms 06/13/2015  . HLD (hyperlipidemia) 06/13/2015  . DDD (degenerative disc disease), cervical 06/13/2015  . Elevated serum creatinine 06/13/2015  . Aphasia 06/13/2015  . Expressive aphasia   . Cerebral infarction due to unspecified mechanism   . Type 2 diabetes mellitus without complication   . IBS (irritable bowel syndrome) 04/24/2013  . High cholesterol 04/24/2013  . Diabetes 04/24/2013  . Essential hypertension, benign 04/24/2013    Virgina Organ, PT CLT [phone] 08/17/2015, 3:12 PM  [facility] [address] [place], [place], [address] Phone: [phone]   Fax:  [phone]

## 2015-08-20 ENCOUNTER — Ambulatory Visit (HOSPITAL_COMMUNITY): Payer: Medicare Other

## 2015-08-20 ENCOUNTER — Ambulatory Visit (HOSPITAL_COMMUNITY): Payer: Medicare Other | Admitting: Physical Therapy

## 2015-08-20 ENCOUNTER — Ambulatory Visit (HOSPITAL_COMMUNITY): Payer: Medicare Other | Admitting: Speech Pathology

## 2015-08-20 DIAGNOSIS — R6889 Other general symptoms and signs: Secondary | ICD-10-CM

## 2015-08-20 DIAGNOSIS — I69319 Unspecified symptoms and signs involving cognitive functions following cerebral infarction: Secondary | ICD-10-CM

## 2015-08-20 DIAGNOSIS — I69320 Aphasia following cerebral infarction: Secondary | ICD-10-CM

## 2015-08-20 DIAGNOSIS — R0602 Shortness of breath: Secondary | ICD-10-CM

## 2015-08-20 DIAGNOSIS — R2689 Other abnormalities of gait and mobility: Secondary | ICD-10-CM

## 2015-08-20 DIAGNOSIS — R52 Pain, unspecified: Secondary | ICD-10-CM

## 2015-08-20 DIAGNOSIS — R269 Unspecified abnormalities of gait and mobility: Secondary | ICD-10-CM | POA: Diagnosis not present

## 2015-08-20 DIAGNOSIS — R29898 Other symptoms and signs involving the musculoskeletal system: Secondary | ICD-10-CM

## 2015-08-20 NOTE — Therapy (Signed)
[facility] [address] [place], [place], [address] Phone: [phone]   Fax:  [phone]  Physical Therapy Treatment  Patient Details  Name: Holly Sparks MRN: [id number] Date of Birth: 1940/12/18 Referring Sidhant Helderman:  Erick Colace, MD  Encounter Date: 08/20/2015      PT End of Session - 08/20/15 1613    Visit Number 8   Number of Visits 18   Date for PT Re-Evaluation 09/09/15   Authorization Type UHC medicare   Authorization Time Period Gcodes done 6th visit   Authorization - Visit Number 8   Authorization - Number of Visits 18   PT Start Time 1534   PT Stop Time 1606   PT Time Calculation (min) 32 min   Activity Tolerance Patient limited by lethargy;Patient limited by pain;Patient limited by fatigue   Behavior During Therapy [facility] for tasks assessed/performed      Past Medical History  Diagnosis Date  . Hypertension     x 10 yrs  . 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 High cholesterol   . Presence of permanent cardiac pacemaker   . Paroxysmal a-fib: Remote hx of afib 06/15/2015    Past Surgical History  Procedure Laterality Date  . Neck surgery      x 2 for spur and disc problems  . Colonoscopy    . Colonoscopy N/A 05/08/2013    Procedure: COLONOSCOPY;  Surgeon: Malissa Hippo, MD;  [facility];  Service: Endoscopy;  Laterality: N/A;  1015    There were no vitals filed for this visit.  Visit Diagnosis:  Abnormality of gait  Weakness of both legs  Poor balance  Decreased functional activity tolerance  Cognitive deficit due to recent stroke  SOB (shortness of breath)  Pain      Subjective Assessment - 08/20/15 1609    Subjective Pt reports she is not feeling well today, genrally painful all over. Pt reports she does not keep up with HEP, but does not explain why.    Pertinent History Information is obtained from chart review: Pt had CVA on 06/13/2015 was discharged to IP rehabiliation on  06/16/2015 and discharged  on 06/27/2015 and is now being referred to out  PT to maximize her functional ability.    Patient Stated Goals To be able to walk easier, have more energy, be more active.    Currently in Pain? Yes   Pain Score 5    Pain Location --  R 4th toe.                         [facility] Adult PT Treatment/Exercise - 08/20/15 0001    Lumbar Exercises: Stretches   Lower Trunk Rotation Other (comment)  10x bilat seated rotation    Lumbar Exercises: Standing   Other Standing Lumbar Exercises cone rotation on foam x 2.   Lumbar Exercises: Seated   Hip Flexion on Ball AROM;10 reps;Both  2x10 bilat   Knee/Hip Exercises: Seated   Sit to Sand 10 reps;without UE support;1 set  very slow, requires tactle cues for posture.              Balance Exercises - 08/20/15 1546    Balance Exercises: Standing   Tandem Stance Eyes open;3 reps;30 secs;Other (comment)  bilat, multiple LOB, able to self correct with ahnds   Cone Rotation Foam/compliant surface  trunk rotation with yello 3000g ball, min  guard   Lift / Chop Limitations normal stance foam:   green physio ball overhead x10 Minguard           PT Education - 08/20/15 1612    Education provided Yes   Education Details encouraged to work on home exercises more frequently.    Person(s) Educated Patient   Methods Explanation   Comprehension Need further instruction;Verbalized understanding          PT Short Term Goals - 08/12/15 1046    PT SHORT TERM GOAL #1   Title I HEP   Time 2   Period Weeks   Status On-going   PT SHORT TERM GOAL #2   Title Pt to be able to complete 6 Sit to stand in 30 seconds to demonstate improved power for easier sit to stand    Time 2   Period Weeks   Status On-going   PT SHORT TERM GOAL #3   Title Pt to be ambulating 20 minutes at a time for improved health habits.   Time 2   Period Weeks   Status Achieved   PT SHORT TERM GOAL #4   Title Pt TUG to be  17 or  less to reduce risk of falls    Time 3   Period Weeks   Status On-going           PT Long Term Goals - 08/12/15 1047    PT LONG TERM GOAL #1   Title I in advance HEP   Time 6   Period Weeks   Status On-going   PT LONG TERM GOAL #2   Title Pt to show no unsteadiness when ambulating without assistive device including turning head and turns   Time 6   Period Weeks   Status On-going   PT LONG TERM GOAL #3   Title Pt to be walking for 30 minutes a day for better health habitis   Time 6   Period Weeks   Status On-going   PT LONG TERM GOAL #4   Title Pt strength to be improved one grade to allow pt to squat to ground and retrun without difficutly   Time 6   Period Weeks   Status On-going               Plan - 08/20/15 1613    Clinical Impression Statement Pt demonstrating limited progress toward goals. Pt is quite bradykinetic and softspoken today, also with delayed reponse to verbal comands and questioning. Pt had 1 instance of nausea, which improved with rest, but otherwise explained that she was nto feeling well today and wanted to take it easy.  Pt continues to demonstrate most diffficulty with balance, with multiple LOB during balance training. Pt is a little distracted and requires verbal cues to attend to task and keep count reps and quality of exercises. Will continue with current POC as previously indicated.    Pt will benefit from skilled therapeutic intervention in order to improve on the following deficits Abnormal gait;Decreased activity tolerance;Decreased balance;Decreased cognition;Decreased safety awareness;Decreased strength;Difficulty walking   Rehab Potential Good   PT Frequency 2x / week   PT Duration 4 weeks   PT Treatment/Interventions ADLs/Self Care Home Management;Gait training;Stair training;Functional mobility training;Therapeutic activities;Therapeutic exercise;Balance training;Patient/family education   PT Next Visit Plan focus on balance,  increasing trunk rotation and arm swing during ambulation   PT Home Exercise Plan no changes    Consulted and Agree with Plan of Care Patient  Problem List Patient Active Problem List   Diagnosis Date Noted  . 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 Paroxysmal a-fib: Remote hx of afib 06/15/2015  . Cerebral infarction due to embolism of left middle cerebral artery   . Essential hypertension   . Stroke-like symptoms 06/13/2015  . HLD (hyperlipidemia) 06/13/2015  . DDD (degenerative disc disease), cervical 06/13/2015  . Elevated serum creatinine 06/13/2015  . Aphasia 06/13/2015  . Expressive aphasia   . Cerebral infarction due to unspecified mechanism   . Type 2 diabetes mellitus without complication   . IBS (irritable bowel syndrome) 04/24/2013  . High cholesterol 04/24/2013  . Diabetes 04/24/2013  . Essential hypertension, benign 04/24/2013    Buccola,Allan C 08/20/2015, 4:20 PM  4:20 PM  Rosamaria Lints, PT, DPT [place] License # [id number]       [facility] [address] [place], [place], [address] Phone: [phone]   Fax:  [phone]

## 2015-08-20 NOTE — Therapy (Signed)
[facility] [address] [place], [place], [address] Phone: [phone]   Fax:  [phone]  Speech Language Pathology Treatment  Patient Details  Name: Holly Sparks MRN: [id number] Date of Birth: 1941/11/08 Referring Provider:  Gareth Morgan, MD  Encounter Date: 08/20/2015      End of Session - 08/20/15 1720    Visit Number 2   Number of Visits 16   Date for SLP Re-Evaluation 09/06/15   Authorization Type UHC Medicare   Authorization Time Period 07/07/2015-09/06/2015   Authorization - Visit Number 2   Authorization - Number of Visits 16   SLP Start Time 1600   SLP Stop Time  1652   SLP Time Calculation (min) 52 min   Activity Tolerance Patient tolerated treatment well      Past Medical History  Diagnosis Date  . Hypertension     x 10 yrs  . 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 High cholesterol   . Presence of permanent cardiac pacemaker   . Paroxysmal a-fib: Remote hx of afib 06/15/2015    Past Surgical History  Procedure Laterality Date  . Neck surgery      x 2 for spur and disc problems  . Colonoscopy    . Colonoscopy N/A 05/08/2013    Procedure: COLONOSCOPY;  Surgeon: Malissa Hippo, MD;  [facility];  Service: Endoscopy;  Laterality: N/A;  1015    There were no vitals filed for this visit.  Visit Diagnosis: Cognitive deficit due to recent stroke  Aphasia following cerebral infarction      Subjective Assessment - 08/20/15 1715    Subjective "I don't know why I haven't seen you."   Currently in Pain? No/denies               ADULT SLP TREATMENT - 08/20/15 1715    General Information   Behavior/Cognition Alert;Cooperative;Pleasant mood   Patient Positioning Upright in chair   Oral care provided N/A   HPI SHADIAMOND KOSKA is a 74 y.o. female with a history of diabetes mellitus, hypertension and hyperlipidemia who takes aspirin 81 mg on a daily basis. She presented to ER on 06/13/2015 with  expressive and receptive aphasia. CT was negative for actue findings but MRI showed:Acute left MCA territory infarct, most confluent involvement in the left basal ganglia. There is basal ganglia petechial hemorrhage, but no significant mass effect. She did not receive IV t-PAdue to late presentation. MBS completed 7/18 and recommended Dys. 3 textures with nectar-thick liquids. Patient transferred to CIR on 7/19 and remained until discharge home on 06/27/2015. A repeat MBSS was completed 06/23/2015 prior to D/C with ongoing recommendation for D3/NTL. Chin tuck eliminated aspiration, however pt was inconsistent with implementation. She made very good progress in CIR, but continues to present with aphasia and cogntive deficits. Pt referred for outpatient SLP therapy to address cognitive linguistic deficits.   Treatment Provided   Treatment provided Cognitive-Linquistic   Pain Assessment   Pain Assessment No/denies pain   Cognitive-Linquistic Treatment   Treatment focused on Aphasia;Cognition;Patient/family/caregiver education   Skilled Treatment generational naming, orientation, word finding strategies   Assessment / Recommendations / Plan   Plan Continue with current plan of care          SLP Education - 08/20/15 1719    Education provided Yes   Education Details home exercises, fill in calendar   Person(s) Educated Patient   Methods Explanation;Demonstration  Comprehension Verbalized understanding          SLP Short Term Goals - 08/20/15 1722    SLP SHORT TERM GOAL #1   Title Pt will complete moderate level divergent naming tasks with 80% acc when provided mild/mod cues.   Baseline 25%   Time 4   Period Weeks   Status On-going   SLP SHORT TERM GOAL #2   Title Pt will produce 5-7 word sentences when describing pictures and given mi/mod cues.   Baseline mod/max cues   Time 4   Period Weeks   Status On-going   SLP SHORT TERM GOAL #3   Title Pt will be able to name family members  and close friends with 100% acc with use of written cues prn.   Baseline 50% mod cues without written assist   Time 4   Period Weeks   Status On-going   SLP SHORT TERM GOAL #4   Title Pt will be oriented to time, place, and situation with 100% acc when cued to use memory book to locate information.   Baseline 25%   Time 4   Period Weeks   Status On-going   SLP SHORT TERM GOAL #5   Title Pt will participate in ongoing evaluation of auditory and reading comprehension skills as well as follow up of previous dysphagia guidelines/recommendations (was on D3/NTL)   Time 4   Period Weeks   Status On-going          SLP Long Term Goals - 08/20/15 1723    SLP LONG TERM GOAL #1   Title Pt will increase cognitive communication skills to [facility] for intermittently supervised environment with use of strategies as needed.    Baseline mod assist in supervised environment   Time 8   Period Weeks   Status On-going          Plan - 08/20/15 1721    Clinical Impression Statement Today was Mrs. Arguijo's first treatment session following her evaluation in August (due to scheduling mix up?). SLP completed informal measures to assess for progress made. She does demonstrate improved orientation, improved ability to name family members, and improved written expression for home address. She continues to feel "foggy" and needs assist from her husband to keep her organized at home. She has been going to church, but otherwise has not interacted much with friends. She was able to list 7 animals when given moderate cues (previously 3), but this took several minutes. She was given a new calendar to last her through the end of the year, as the one given previously was no longer in her folder. Pt is scheduled 2x next week. Continue POC.    Speech Therapy Frequency 2x / week   Duration --  8 weeks   Treatment/Interventions SLP instruction and feedback;Cueing hierarchy;Cognitive reorganization;Compensatory  strategies;Patient/family education;Functional tasks;Multimodal communcation approach;Compensatory techniques   Potential to Achieve Goals Good   Potential Considerations Other (comment)  Pt somewhat anxious   SLP Home Exercise Plan Pt will complete HEP as assigned to faciliate carryover of treatment strategies and techniques in home/community environment   Consulted and Agree with Plan of Care Patient;Family member/caregiver   Family Member Consulted husband        Problem List Patient Active Problem List   Diagnosis Date Noted  . 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 Paroxysmal a-fib: Remote hx of afib 06/15/2015  . Cerebral infarction due to embolism of left middle cerebral artery   . Essential hypertension   .  Stroke-like symptoms 06/13/2015  . HLD (hyperlipidemia) 06/13/2015  . DDD (degenerative disc disease), cervical 06/13/2015  . Elevated serum creatinine 06/13/2015  . Aphasia 06/13/2015  . Expressive aphasia   . Cerebral infarction due to unspecified mechanism   . Type 2 diabetes mellitus without complication   . IBS (irritable bowel syndrome) 04/24/2013  . High cholesterol 04/24/2013  . Diabetes 04/24/2013  . Essential hypertension, benign 04/24/2013   Thank you,  Havery Moros, CCC-SLP [phone]  [facility] 08/20/2015, 5:36 PM  [facility] [address] [place], [place], [address] Phone: [phone]   Fax:  [phone]

## 2015-08-24 ENCOUNTER — Encounter: Payer: Medicare Other | Admitting: Physical Medicine & Rehabilitation

## 2015-08-25 ENCOUNTER — Ambulatory Visit (HOSPITAL_COMMUNITY): Payer: Medicare Other

## 2015-08-25 ENCOUNTER — Ambulatory Visit (HOSPITAL_COMMUNITY): Payer: Medicare Other | Admitting: Speech Pathology

## 2015-08-25 DIAGNOSIS — R269 Unspecified abnormalities of gait and mobility: Secondary | ICD-10-CM | POA: Diagnosis not present

## 2015-08-25 DIAGNOSIS — R29898 Other symptoms and signs involving the musculoskeletal system: Secondary | ICD-10-CM

## 2015-08-25 DIAGNOSIS — R6889 Other general symptoms and signs: Secondary | ICD-10-CM

## 2015-08-25 DIAGNOSIS — I69319 Unspecified symptoms and signs involving cognitive functions following cerebral infarction: Secondary | ICD-10-CM

## 2015-08-25 DIAGNOSIS — R2689 Other abnormalities of gait and mobility: Secondary | ICD-10-CM

## 2015-08-25 DIAGNOSIS — I69320 Aphasia following cerebral infarction: Secondary | ICD-10-CM

## 2015-08-25 DIAGNOSIS — R52 Pain, unspecified: Secondary | ICD-10-CM

## 2015-08-25 NOTE — Therapy (Signed)
[facility] [address] [place], [place], [address] Phone: [phone]   Fax:  [phone]  Physical Therapy Treatment  Patient Details  Name: Holly Sparks MRN: [id number] Date of Birth: [birth year] Referring Provider:  Erick Colace, MD  Encounter Date: 08/25/2015      PT End of Session - 08/25/15 1520    Visit Number 9   Number of Visits 18   Date for PT Re-Evaluation 09/09/15   Authorization Type UHC medicare   Authorization Time Period Gcodes done 6th visit   Authorization - Visit Number 9   Authorization - Number of Visits 18   PT Start Time 1438   PT Stop Time 1512   PT Time Calculation (min) 34 min   Activity Tolerance Patient tolerated treatment well;Patient limited by fatigue   Behavior During Therapy [facility] for tasks assessed/performed      Past Medical History  Diagnosis Date  . Hypertension     x 10 yrs  . 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 High cholesterol   . Presence of permanent cardiac pacemaker   . Paroxysmal a-fib: Remote hx of afib 06/15/2015    Past Surgical History  Procedure Laterality Date  . Neck surgery      x 2 for spur and disc problems  . Colonoscopy    . Colonoscopy N/A 05/08/2013    Procedure: COLONOSCOPY;  Surgeon: Malissa Hippo, MD;  [facility];  Service: Endoscopy;  Laterality: N/A;  1015    There were no vitals filed for this visit.  Visit Diagnosis:  Abnormality of gait  Weakness of both legs  Poor balance  Decreased functional activity tolerance  Pain      Subjective Assessment - 08/25/15 1438    Subjective Pt reports she is feeling much better today. She denies having any pain. She has not been performing HEP but is hopeful that hving everything organized in her binder will help with consistency.    Pertinent History Information is obtained from chart review: Pt had CVA on 06/13/2015 was discharged to IP rehabiliation on 06/16/2015 and discharged  on  06/27/2015 and is now being referred to out  PT to maximize her functional ability.    Patient Stated Goals To be able to walk easier, have more energy, be more active.    Currently in Pain? No/denies                         [facility] Adult PT Treatment/Exercise - 08/25/15 0001    Ambulation/Gait   Ambulation/Gait Yes   Ambulation/Gait Assistance 4: Min guard   Ambulation Distance (Feet) 450 Feet   Assistive device None   Gait Comments gradually decreasing L hip strength, and decreasing R step length/R heel strike.   heavu cues for correction, response variable by cognitive ac   Lumbar Exercises: Seated   Long Arc Quad on Chair Both;2 sets;10 reps   Hip Flexion on [place] Both;Other (comment);AAROM  2x10 in chair, mod A on RLE for full ROM   Sit to Stand 10 reps  2x10, airex on chair   Knee/Hip Exercises: Seated   Clamshell with TheraBand Red  2x10   Other Seated Knee/Hip Exercises seated ankle dorsiflexion with Green TB  Mond A on R to perform full ROM             Balance Exercises - 08/25/15 1516    Balance  Exercises: Seated   Static Sitting Other (comment);Eyes opened  BUE overhead flexoin + rotation c physioball 10x bilat           PT Education - 08/25/15 1520    Education provided No          PT Short Term Goals - 08/12/15 1046    PT SHORT TERM GOAL #1   Title I HEP   Time 2   Period Weeks   Status On-going   PT SHORT TERM GOAL #2   Title Pt to be able to complete 6 Sit to stand in 30 seconds to demonstate improved power for easier sit to stand    Time 2   Period Weeks   Status On-going   PT SHORT TERM GOAL #3   Title Pt to be ambulating 20 minutes at a time for improved health habits.   Time 2   Period Weeks   Status Achieved   PT SHORT TERM GOAL #4   Title Pt TUG to be  17 or less to reduce risk of falls    Time 3   Period Weeks   Status On-going           PT Long Term Goals - 08/12/15 1047    PT LONG TERM GOAL #1   Title I  in advance HEP   Time 6   Period Weeks   Status On-going   PT LONG TERM GOAL #2   Title Pt to show no unsteadiness when ambulating without assistive device including turning head and turns   Time 6   Period Weeks   Status On-going   PT LONG TERM GOAL #3   Title Pt to be walking for 30 minutes a day for better health habitis   Time 6   Period Weeks   Status On-going   PT LONG TERM GOAL #4   Title Pt strength to be improved one grade to allow pt to squat to ground and retrun without difficutly   Time 6   Period Weeks   Status On-going               Plan - 08/25/15 1522    Clinical Impression Statement Pt feeling much better today, more motivated to participate in therapy, and more tolerante to activity. Pt is making progress in strengthening activities, but still remains quite limited in endurance in select muscle groups. Pt was able to tolerate increase in gait distance and speed, but quality during second lap was very diminished, with demonstrated difficulty to correct due to difficulty focusing on the task.    Pt will benefit from skilled therapeutic intervention in order to improve on the following deficits Abnormal gait;Decreased activity tolerance;Decreased balance;Decreased cognition;Decreased safety awareness;Decreased strength;Difficulty walking   Rehab Potential Good   PT Frequency 2x / week   PT Duration 4 weeks   PT Treatment/Interventions ADLs/Self Care Home Management;Gait training;Stair training;Functional mobility training;Therapeutic activities;Therapeutic exercise;Balance training;Patient/family education   PT Next Visit Plan focus on balance, increasing trunk rotation and arm swing during ambulation   PT Home Exercise Plan no changes    Consulted and Agree with Plan of Care Patient        Problem List Patient Active Problem List   Diagnosis Date Noted  . 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 Paroxysmal a-fib: Remote hx of afib 06/15/2015  .  Cerebral infarction due to embolism of left middle cerebral artery   . Essential hypertension   . Stroke-like symptoms 06/13/2015  .  HLD (hyperlipidemia) 06/13/2015  . DDD (degenerative disc disease), cervical 06/13/2015  . Elevated serum creatinine 06/13/2015  . Aphasia 06/13/2015  . Expressive aphasia   . Cerebral infarction due to unspecified mechanism   . Type 2 diabetes mellitus without complication   . IBS (irritable bowel syndrome) 04/24/2013  . High cholesterol 04/24/2013  . Diabetes 04/24/2013  . Essential hypertension, benign 04/24/2013    Buccola,Allan C 08/25/2015, 3:26 PM  3:26 PM  Rosamaria Lints, PT, DPT [place] License # [id number]       [facility] [address] [place], [place], [address] Phone: [phone]   Fax:  [phone]

## 2015-08-25 NOTE — Therapy (Signed)
[facility] [address] [place], [place], [address] Phone: [phone]   Fax:  [phone]  Speech Language Pathology Treatment  Patient Details  Name: Holly Sparks MRN: [id number] Date of Birth: 1941-11-27 Referring Provider:  Erick Colace, MD  Encounter Date: 08/25/2015      End of Session - 08/25/15 1722    Visit Number 3   Number of Visits 16   Date for SLP Re-Evaluation 09/06/15   Authorization Type UHC Medicare   Authorization Time Period 07/07/2015-09/06/2015   Authorization - Visit Number 3   Authorization - Number of Visits 16   SLP Start Time 1352   SLP Stop Time  1435   SLP Time Calculation (min) 43 min   Activity Tolerance Patient tolerated treatment well      Past Medical History  Diagnosis Date  . Hypertension     x 10 yrs  . 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 High cholesterol   . Presence of permanent cardiac pacemaker   . Paroxysmal a-fib: Remote hx of afib 06/15/2015    Past Surgical History  Procedure Laterality Date  . Neck surgery      x 2 for spur and disc problems  . Colonoscopy    . Colonoscopy N/A 05/08/2013    Procedure: COLONOSCOPY;  Surgeon: Holly Hippo, MD;  [facility];  Service: Endoscopy;  Laterality: N/A;  1015    There were no vitals filed for this visit.  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 No/denies               ADULT SLP TREATMENT - 08/25/15 1720    General Information   Behavior/Cognition Alert;Cooperative;Pleasant mood   Patient Positioning Upright in chair   Oral care provided N/A   HPI Holly Sparks is a 74 y.o. female with a history of diabetes mellitus, hypertension and hyperlipidemia who takes aspirin 81 mg on a daily basis. She presented to ER on 06/13/2015 with  expressive and receptive aphasia. CT was negative for actue findings but MRI showed:Acute left MCA territory infarct, most confluent involvement in the left basal ganglia. There is basal ganglia petechial hemorrhage, but no significant mass effect. She did not receive IV t-PAdue to late presentation. MBS completed 7/18 and recommended Dys. 3 textures with nectar-thick liquids. Patient transferred to CIR on 7/19 and remained until discharge home on 06/27/2015. A repeat MBSS was completed 06/23/2015 prior to D/C with ongoing recommendation for D3/NTL. Chin tuck eliminated aspiration, however pt was inconsistent with implementation. She made very good progress in CIR, but continues to present with aphasia and cogntive deficits. Pt referred for outpatient SLP therapy to address cognitive linguistic deficits.   Treatment Provided   Treatment provided Cognitive-Linquistic   Pain Assessment   Pain Assessment No/denies pain   Cognitive-Linquistic Treatment   Treatment focused on Aphasia;Cognition;Patient/family/caregiver education   Skilled Treatment generational naming, orientation, word finding strategies   Assessment / Recommendations / Plan   Plan Continue with current plan of care          SLP Education - 08/25/15 1721    Education provided Yes   Education Details Provided pt with a memory/communication planner/binder and encouraged use at home   Person(s) Educated Patient  Methods Explanation;Demonstration   Comprehension Verbalized understanding;Need further instruction          SLP Short Term Goals - 08/25/15 1743    SLP SHORT TERM GOAL #1   Title Pt will complete moderate level divergent naming tasks with 80% acc when provided mild/mod cues.   Baseline 25%   Time 4   Period Weeks   Status On-going   SLP SHORT TERM GOAL #2   Title Pt will produce 5-7 word sentences when describing pictures and given mi/mod cues.   Baseline mod/max cues   Time 4   Period Weeks   Status On-going    SLP SHORT TERM GOAL #3   Title Pt will be able to name family members and close friends with 100% acc with use of written cues prn.   Baseline 50% mod cues without written assist   Time 4   Period Weeks   Status On-going   SLP SHORT TERM GOAL #4   Title Pt will be oriented to time, place, and situation with 100% acc when cued to use memory book to locate information.   Baseline 25%   Time 4   Period Weeks   Status On-going   SLP SHORT TERM GOAL #5   Title Pt will participate in ongoing evaluation of auditory and reading comprehension skills as well as follow up of previous dysphagia guidelines/recommendations (was on D3/NTL)   Time 4   Period Weeks   Status On-going          SLP Long Term Goals - 08/25/15 1743    SLP LONG TERM GOAL #1   Title Pt will increase cognitive communication skills to [facility] for intermittently supervised environment with use of strategies as needed.    Baseline mod assist in supervised environment   Time 8   Period Weeks   Status On-going          Plan - 08/25/15 1723    Clinical Impression Statement Holly Sparks brought her folder in and her husband added some appointments. SLP provided a binder with dividers for pt to keep and assisted pt in transferring folder materials to binder. Sections were created for: calendar, medical information and appointments, phsical therapy, speech therapy, and nutrition. Pt has a lot of information/papers, but doesn't seem sure what it all means. Her blood glucose tracking record has not been completed, but she reports that her husband has been writing that information down elsewhere. SLP explained that we will be using the binder to help keep her organized and thinking clearly. She required moderate phonemic cues to name close family members and was unable to tell me where she was born. She was encouraged to think about something else and come back to that later (filling out a personal profile for her binder). Pt has a session  on Thursday, so we will complete the binder at that time. Continue POC.    Speech Therapy Frequency 2x / week   Duration --  8 weeks   Treatment/Interventions SLP instruction and feedback;Cueing hierarchy;Cognitive reorganization;Compensatory strategies;Patient/family education;Functional tasks;Multimodal communcation approach;Compensatory techniques   Potential to Achieve Goals Good   Potential Considerations Other (comment)  Pt somewhat anxious   SLP Home Exercise Plan Pt will complete HEP as assigned to faciliate carryover of treatment strategies and techniques in home/community environment   Consulted and Agree with Plan of Care Patient;Family member/caregiver   Family Member Consulted husband        Problem List Patient Active Problem List   Diagnosis  Date Noted  . 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 Paroxysmal a-fib: Remote hx of afib 06/15/2015  . Cerebral infarction due to embolism of left middle cerebral artery   . Essential hypertension   . Stroke-like symptoms 06/13/2015  . HLD (hyperlipidemia) 06/13/2015  . DDD (degenerative disc disease), cervical 06/13/2015  . Elevated serum creatinine 06/13/2015  . Aphasia 06/13/2015  . Expressive aphasia   . Cerebral infarction due to unspecified mechanism   . Type 2 diabetes mellitus without complication   . IBS (irritable bowel syndrome) 04/24/2013  . High cholesterol 04/24/2013  . Diabetes 04/24/2013  . Essential hypertension, benign 04/24/2013   Thank you,  Havery Moros, CCC-SLP [phone]  [facility] 08/25/2015, 5:45 PM  [facility] [address] [place], [place], [address] Phone: [phone]   Fax:  [phone]

## 2015-08-25 NOTE — Patient Instructions (Signed)
-  Continue to progress ambulation c husband daily, with emphasis on increasing R step length and R heel strike.

## 2015-08-27 ENCOUNTER — Ambulatory Visit (HOSPITAL_COMMUNITY): Payer: Medicare Other

## 2015-08-27 ENCOUNTER — Ambulatory Visit (HOSPITAL_COMMUNITY): Payer: Medicare Other | Admitting: Speech Pathology

## 2015-08-27 DIAGNOSIS — R52 Pain, unspecified: Secondary | ICD-10-CM

## 2015-08-27 DIAGNOSIS — I69319 Unspecified symptoms and signs involving cognitive functions following cerebral infarction: Secondary | ICD-10-CM

## 2015-08-27 DIAGNOSIS — R269 Unspecified abnormalities of gait and mobility: Secondary | ICD-10-CM

## 2015-08-27 DIAGNOSIS — R2689 Other abnormalities of gait and mobility: Secondary | ICD-10-CM

## 2015-08-27 DIAGNOSIS — I69320 Aphasia following cerebral infarction: Secondary | ICD-10-CM

## 2015-08-27 DIAGNOSIS — R29898 Other symptoms and signs involving the musculoskeletal system: Secondary | ICD-10-CM

## 2015-08-27 DIAGNOSIS — R6889 Other general symptoms and signs: Secondary | ICD-10-CM

## 2015-08-27 NOTE — Therapy (Signed)
[facility] [address] [place], [place], [address] Phone: [phone]   Fax:  [phone]  Speech Language Pathology Treatment  Patient Details  Name: Holly Sparks MRN: [id number] Date of Birth: 02-03-1941 Referring Provider:  Gareth Morgan, MD  Encounter Date: 08/27/2015      End of Session - 08/27/15 1801    Visit Number 4   Number of Visits 16   Date for SLP Re-Evaluation 09/06/15   Authorization Type UHC Medicare   Authorization Time Period 07/07/2015-09/06/2015   Authorization - Visit Number 4   Authorization - Number of Visits 16   SLP Start Time 1600   SLP Stop Time  1645   SLP Time Calculation (min) 45 min   Activity Tolerance Patient tolerated treatment well      Past Medical History  Diagnosis Date  . Hypertension     x 10 yrs  . 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 High cholesterol   . Presence of permanent cardiac pacemaker   . Paroxysmal a-fib: Remote hx of afib 06/15/2015    Past Surgical History  Procedure Laterality Date  . Neck surgery      x 2 for spur and disc problems  . Colonoscopy    . Colonoscopy N/A 05/08/2013    Procedure: COLONOSCOPY;  Surgeon: Malissa Hippo, MD;  [facility];  Service: Endoscopy;  Laterality: N/A;  1015    There were no vitals filed for this visit.  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 No/denies               ADULT SLP TREATMENT - 08/27/15 1800    General Information   Behavior/Cognition Alert;Cooperative;Pleasant mood   Patient Positioning Upright in chair   Oral care provided N/A   HPI Holly Sparks is a 74 y.o. female with a history of diabetes mellitus, hypertension and hyperlipidemia who takes aspirin 81 mg on a daily basis. She presented to ER on 06/13/2015  with expressive and receptive aphasia. CT was negative for actue findings but MRI showed:Acute left MCA territory infarct, most confluent involvement in the left basal ganglia. There is basal ganglia petechial hemorrhage, but no significant mass effect. She did not receive IV t-PAdue to late presentation. MBS completed 7/18 and recommended Dys. 3 textures with nectar-thick liquids. Patient transferred to CIR on 7/19 and remained until discharge home on 06/27/2015. A repeat MBSS was completed 06/23/2015 prior to D/C with ongoing recommendation for D3/NTL. Chin tuck eliminated aspiration, however pt was inconsistent with implementation. She made very good progress in CIR, but continues to present with aphasia and cogntive deficits. Pt referred for outpatient SLP therapy to address cognitive linguistic deficits.   Treatment Provided   Treatment provided Cognitive-Linquistic   Pain Assessment   Pain Assessment No/denies pain   Cognitive-Linquistic Treatment   Treatment focused on Aphasia;Cognition;Patient/family/caregiver education   Skilled Treatment generational naming, orientation, word finding strategies, organization, memory strategies   Assessment / Recommendations / Plan   Plan Continue with current plan of care            SLP Short Term Goals - 08/27/15 1806    SLP SHORT TERM GOAL #1   Title Pt will complete moderate level divergent naming tasks  with 80% acc when provided mild/mod cues.   Baseline 25%   Time 4   Period Weeks   Status On-going   SLP SHORT TERM GOAL #2   Title Pt will produce 5-7 word sentences when describing pictures and given mi/mod cues.   Baseline mod/max cues   Time 4   Period Weeks   Status On-going   SLP SHORT TERM GOAL #3   Title Pt will be able to name family members and close friends with 100% acc with use of written cues prn.   Baseline 50% mod cues without written assist   Time 4   Period Weeks   Status On-going   SLP SHORT TERM GOAL #4   Title Pt  will be oriented to time, place, and situation with 100% acc when cued to use memory book to locate information.   Baseline 25%   Time 4   Period Weeks   Status On-going   SLP SHORT TERM GOAL #5   Title Pt will participate in ongoing evaluation of auditory and reading comprehension skills as well as follow up of previous dysphagia guidelines/recommendations (was on D3/NTL)   Time 4   Period Weeks   Status On-going          SLP Long Term Goals - 08/27/15 1806    SLP LONG TERM GOAL #1   Title Pt will increase cognitive communication skills to [facility] for intermittently supervised environment with use of strategies as needed.    Baseline mod assist in supervised environment   Time 8   Period Weeks   Status On-going          Plan - 08/27/15 1801    Clinical Impression Statement Mrs. Bognar brought her memory binder to therapy and reported having questions about "what to do with it". She required min cues to navigate the book (locate calendar, schedule, and speech info) and was reminded to cross off the previous day on her calendar every day. We continued filling out a "personal profile" page. She was generally able to come up with at least one response for each question, but had difficulty generating any additional (ie. named one friend, previous job, food). She was able to name several local restaurants when cued to name a barbecue place or ice cream place, but had difficulty generating a list on her own. Session was reviewed with her husband when I walked her out to his car and he was encouraged to use the binder to add important names so that she could easily access. Continue POC.    Speech Therapy Frequency 2x / week   Duration --  8 weeks   Treatment/Interventions SLP instruction and feedback;Cueing hierarchy;Cognitive reorganization;Compensatory strategies;Patient/family education;Functional tasks;Multimodal communcation approach;Compensatory techniques   Potential to Achieve Goals Good    Potential Considerations Other (comment)  Pt somewhat anxious   SLP Home Exercise Plan Pt will complete HEP as assigned to faciliate carryover of treatment strategies and techniques in home/community environment   Consulted and Agree with Plan of Care Patient;Family member/caregiver   Family Member Consulted husband        Problem List Patient Active Problem List   Diagnosis Date Noted  . 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 Paroxysmal a-fib: Remote hx of afib 06/15/2015  . Cerebral infarction due to embolism of left middle cerebral artery   . Essential hypertension   . Stroke-like symptoms 06/13/2015  . HLD (hyperlipidemia) 06/13/2015  . DDD (degenerative disc disease), cervical 06/13/2015  . Elevated  serum creatinine 06/13/2015  . Aphasia 06/13/2015  . Expressive aphasia   . Cerebral infarction due to unspecified mechanism   . Type 2 diabetes mellitus without complication   . IBS (irritable bowel syndrome) 04/24/2013  . High cholesterol 04/24/2013  . Diabetes 04/24/2013  . Essential hypertension, benign 04/24/2013   Thank you,  Havery Moros, CCC-SLP [phone]  [facility] 08/27/2015, 6:07 PM  [facility] [address] [place], [place], [address] Phone: [phone]   Fax:  [phone]

## 2015-08-27 NOTE — Therapy (Signed)
[facility] [address] [place], [place], [address] Phone: [phone]   Fax:  [phone]  Physical Therapy Treatment  Patient Details  Name: Holly Sparks MRN: [id number] Date of Birth: 1941-08-14 Referring Provider:  Gareth Morgan, MD  Encounter Date: 08/27/2015      PT End of Session - 08/27/15 1718    Visit Number 10   Number of Visits 18   Date for PT Re-Evaluation 09/09/15   Authorization Type UHC medicare   Authorization Time Period Gcodes done 6th visit   Authorization - Visit Number 10   Authorization - Number of Visits 16   PT Start Time 1522   PT Stop Time 1603   PT Time Calculation (min) 41 min   Equipment Utilized During Treatment Gait belt   Activity Tolerance Patient tolerated treatment well;Patient limited by fatigue   Behavior During Therapy [facility] for tasks assessed/performed      Past Medical History  Diagnosis Date  . Hypertension     x 10 yrs  . 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 High cholesterol   . Presence of permanent cardiac pacemaker   . Paroxysmal a-fib: Remote hx of afib 06/15/2015    Past Surgical History  Procedure Laterality Date  . Neck surgery      x 2 for spur and disc problems  . Colonoscopy    . Colonoscopy N/A 05/08/2013    Procedure: COLONOSCOPY;  Surgeon: Malissa Hippo, MD;  [facility];  Service: Endoscopy;  Laterality: N/A;  1015    There were no vitals filed for this visit.  Visit Diagnosis:  Abnormality of gait  Weakness of both legs  Poor balance  Decreased functional activity tolerance  Pain      Subjective Assessment - 08/27/15 1549    Subjective Pt entered dept wearing tennis shoes, stated her balance feels better in the shoes.  No reports of pain.  Has been doing lots of walking daily   Currently in Pain? No/denies           [facility] Adult PT Treatment/Exercise - 08/27/15 0001    Exercises   Exercises Knee/Hip   Knee/Hip Exercises:  Standing   Functional Squat 15 reps   Functional Squat Limitations 3D hip excursion    SLS 5 reps, Best Rt 7", Lt 5"   Gait Training Gait training 4 sets x 226 with dowel rods B hands to improve UE sequence and trunk rotation             Balance Exercises - 08/27/15 1714    Balance Exercises: Standing   SLS Eyes open  Lt 5", Rt 7"   Cone Rotation Foam/compliant surface  trunk rotation with cones             PT Short Term Goals - 08/12/15 1046    PT SHORT TERM GOAL #1   Title I HEP   Time 2   Period Weeks   Status On-going   PT SHORT TERM GOAL #2   Title Pt to be able to complete 6 Sit to stand in 30 seconds to demonstate improved power for easier sit to stand    Time 2   Period Weeks   Status On-going   PT SHORT TERM GOAL #3   Title Pt to be ambulating 20 minutes at a time for improved health habits.   Time 2   Period Weeks   Status Achieved  PT SHORT TERM GOAL #4   Title Pt TUG to be  17 or less to reduce risk of falls    Time 3   Period Weeks   Status On-going           PT Long Term Goals - 08/12/15 1047    PT LONG TERM GOAL #1   Title I in advance HEP   Time 6   Period Weeks   Status On-going   PT LONG TERM GOAL #2   Title Pt to show no unsteadiness when ambulating without assistive device including turning head and turns   Time 6   Period Weeks   Status On-going   PT LONG TERM GOAL #3   Title Pt to be walking for 30 minutes a day for better health habitis   Time 6   Period Weeks   Status On-going   PT LONG TERM GOAL #4   Title Pt strength to be improved one grade to allow pt to squat to ground and retrun without difficutly   Time 6   Period Weeks   Status On-going               Plan - 08/27/15 1718    Clinical Impression Statement Session focus on improving trunk rotation and appropriate UE sequence to improve gait mechanics.  Pt continues to demonstrate limited trunk rotation and arms byside with moderate cueing to swing  arms.  Utilized dowel rods B hands to facilitate appropriate arm swing with max vc required.  Added 3D hip excursion to improve hip mobilty and gluteal strengthening for gait.  Pt limited by fatigue wtih activiites requiring more cueing with fatigue.  No reoprts of pain through session;   PT Next Visit Plan focus on balance, increasing trunk rotation and arm swing during ambulation        Problem List Patient Active Problem List   Diagnosis Date Noted  . 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 Paroxysmal a-fib: Remote hx of afib 06/15/2015  . Cerebral infarction due to embolism of left middle cerebral artery   . Essential hypertension   . Stroke-like symptoms 06/13/2015  . HLD (hyperlipidemia) 06/13/2015  . DDD (degenerative disc disease), cervical 06/13/2015  . Elevated serum creatinine 06/13/2015  . Aphasia 06/13/2015  . Expressive aphasia   . Cerebral infarction due to unspecified mechanism   . Type 2 diabetes mellitus without complication   . IBS (irritable bowel syndrome) 04/24/2013  . High cholesterol 04/24/2013  . Diabetes 04/24/2013  . Essential hypertension, benign 04/24/2013   Becky Sax, LPTA; CBIS [phone]  Juel Burrow 08/27/2015, 5:23 PM  [facility] [address] [place], [place], [address] Phone: [phone]   Fax:  [phone]

## 2015-08-31 ENCOUNTER — Ambulatory Visit (INDEPENDENT_AMBULATORY_CARE_PROVIDER_SITE_OTHER): Payer: Medicare Other | Admitting: Neurology

## 2015-08-31 ENCOUNTER — Encounter: Payer: Self-pay | Admitting: Neurology

## 2015-08-31 VITALS — BP 108/67 | HR 60 | Ht 67.0 in | Wt 143.0 lb

## 2015-08-31 DIAGNOSIS — I480 Paroxysmal atrial fibrillation: Secondary | ICD-10-CM | POA: Diagnosis not present

## 2015-08-31 DIAGNOSIS — E1159 Type 2 diabetes mellitus with other circulatory complications: Secondary | ICD-10-CM | POA: Diagnosis not present

## 2015-08-31 DIAGNOSIS — E785 Hyperlipidemia, unspecified: Secondary | ICD-10-CM | POA: Diagnosis not present

## 2015-08-31 DIAGNOSIS — I63412 Cerebral infarction due to embolism of left middle cerebral artery: Secondary | ICD-10-CM

## 2015-08-31 DIAGNOSIS — Z7901 Long term (current) use of anticoagulants: Secondary | ICD-10-CM | POA: Diagnosis not present

## 2015-08-31 DIAGNOSIS — I10 Essential (primary) hypertension: Secondary | ICD-10-CM

## 2015-08-31 NOTE — Patient Instructions (Signed)
-   continue eliquis and lipitor for stroke prevention - follow up with cardiology for afib - Follow up with your primary care physician for stroke risk factor modification. Recommend maintain blood pressure goal <130/80, diabetes with hemoglobin A1c goal below 6.5% and lipids with LDL cholesterol goal below 70 mg/dL.  - check BP and glucose at home - continue work with speech therapy - follow up in 3 months

## 2015-08-31 NOTE — Progress Notes (Signed)
STROKE NEUROLOGY FOLLOW UP NOTE  NAME: Holly Sparks DOB: [birth year]  REASON FOR VISIT: stroke follow up HISTORY FROM: pt and chart  Today we had the pleasure of seeing Holly Sparks in follow-up at our Neurology Clinic. Pt was accompanied by friend.   History Summary [name] is a 74 y.o. female with history of DM, HTN, HLD and remote atrial fibrillation not on anticoagulation was admitted on 06/13/15 for expressive and receptive aphasia. 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 MRA, carotid Doppler, 2-D echo unremarkable. LDL 70, and A1c 11.8. Stroke etiology likely due to A. fib not on anticoagulation. She was put on eliquis and discharged to CIR.  Interval History During the interval time, the patient has been doing well. Language much improved. Continued to have speech therapy twice a week. She still has some word finding difficulties intermittently but able to understand. Has cane for walking but prefer not to use it. BP 108/67 today in clinic.  REVIEW OF SYSTEMS: Full 14 system review of systems performed and notable only for those listed below and in HPI above, all others are negative:  Constitutional:   Cardiovascular:  Ear/Nose/Throat:   Skin:  Eyes:   Respiratory:   Gastroitestinal:   Genitourinary:  Hematology/Lymphatic:   Endocrine:  Musculoskeletal:   Allergy/Immunology: Runny nose Neurological:  Memory loss Psychiatric:  Sleep:   The following represents the patient's updated allergies and side effects list: Allergies  Allergen Reactions  . Prednisone Nausea And Vomiting    The neurologically relevant items on the patient's problem list were reviewed on today's visit.  Neurologic Examination  A problem focused neurological exam (12 or more points of the single system neurologic examination, vital signs counts as 1 point, cranial nerves count for 8 points) was performed.  Blood  pressure 108/67, pulse 60, height '5\' 7"'  (1.702 m), weight 143 lb (64.864 kg).  General - Well nourished, well developed, in no apparent distress.  Ophthalmologic - Sharp disc margins OU.   Cardiovascular - Regular rate and rhythm with no murmur.  Mental Status -  Level of arousal and orientation to time, place, and person were intact. Language exam showed mild paucity of speech, mild hesitancy on expression, follows simple commands but not 2- step commands. Naming 1/4 with perseveration, intact repetition but impaired reading.   Fund of Knowledge was assessed and was intact.  Cranial Nerves II - XII - II - Visual field intact OU. III, IV, VI - Extraocular movements intact. V - Facial sensation intact bilaterally. VII - mild right facial droop. VIII - Hearing & vestibular intact bilaterally. X - Palate elevates symmetrically. XI - Chin turning & shoulder shrug intact bilaterally. XII - Tongue protrusion intact.  Motor Strength - The patient's strength was normal in all extremities and pronator drift was absent.  Bulk was normal and fasciculations were absent.   Motor Tone - Muscle tone was assessed at the neck and appendages and was normal.  Reflexes - The patient's reflexes were 1+ in all extremities and she had no pathological reflexes.  Sensory - Light touch, temperature/pinprick were assessed and were normal.    Coordination - The patient had normal movements in the hands and feet with no ataxia or dysmetria.  Tremor was absent.  Gait and Station - has cane but not use it for walking, slow and cautious gait.  Data reviewed: I personally reviewed the images and agree with the radiology  interpretations.  Dg Chest 1 View 06/13/2015  No active disease.   Ct Head Wo Contrast 06/13/2015  1. No acute intracranial abnormalities.  2. Brain atrophy and mild chronic microvascular disease.   Mri & Mra Head Wo Contrast 06/13/2015  1. Acute left MCA territory infarct,  most confluent involvement in the left basal ganglia. There is basal ganglia petechial hemorrhage, but no significant mass effect.  2. Negative intracranial MRA. No left MCA branch occlusion identified.  3. Aside from the acute findings, normal for age noncontrast MRI appearance of the brain.   Carotid Doppler There is 1-39% bilateral ICA stenosis. Vertebral artery flow is antegrade.   2D Echocardiogram  - Left ventricle: The cavity size was normal. There was moderateconcentric hypertrophy. Systolic function was normal. Theestimated ejection fraction was in the range of 60% to 65%. Wallmotion was normal; there were no regional wall motionabnormalities. There was an increased relative contribution ofatrial contraction to ventricular filling. Doppler parameters areconsistent with abnormal left ventricular relaxation (grade 1diastolic dysfunction). - Mitral valve: There was trivial regurgitation.  Component     Latest Ref Rng 06/14/2015  Cholesterol     0 - 200 mg/dL 135  Triglycerides     <150 mg/dL 185 (H)  HDL Cholesterol     >40 mg/dL 28 (L)  Total CHOL/HDL Ratio      4.8  VLDL     0 - 40 mg/dL 37  LDL (calc)     0 - 99 mg/dL 70  Hemoglobin A1C     4.8 - 5.6 % 11.8 (H)  Mean Plasma Glucose      292    Assessment: As you may recall, she is a 74 y.o. Caucasian female with PMH of DM, HTN, HLD and remote atrial fibrillation not on anticoagulation was admitted on 06/13/15 for expressive and receptive aphasia. MRI showed acute left MCA territory infarct. Other stroke work up negative. LDL 70, and A1c 11.8. Stroke etiology likely due to A. fib not on anticoagulation. She was put on eliquis and d/c to CIR. Her language skills much improved.  Plan:  - continue eliquis and lipitor for stroke prevention - follow up with cardiology for afib - Follow up with your primary care physician for stroke risk factor modification. Recommend maintain blood pressure goal <130/80, diabetes  with hemoglobin A1c goal below 6.5% and lipids with LDL cholesterol goal below 70 mg/dL.  - check BP and glucose at home - continue speech therapy - RTC in 3 months  No orders of the defined types were placed in this encounter.    Meds ordered this encounter  Medications  . ACCU-CHEK SOFTCLIX LANCETS lancets    Sig:   . Blood Glucose Monitoring Suppl (ACCU-CHEK AVIVA PLUS) W/DEVICE KIT    Sig:   . FLUoxetine (PROZAC) 10 MG capsule    Sig:     Patient Instructions  - continue eliquis and lipitor for stroke prevention - follow up with cardiology for afib - Follow up with your primary care physician for stroke risk factor modification. Recommend maintain blood pressure goal <130/80, diabetes with hemoglobin A1c goal below 6.5% and lipids with LDL cholesterol goal below 70 mg/dL.  - check BP and glucose at home - continue work with speech therapy - follow up in 3 months   Rosalin Hawking, MD PhD [facility] [address] [place], [place] [address] [phone]

## 2015-09-01 ENCOUNTER — Ambulatory Visit (HOSPITAL_COMMUNITY): Payer: Medicare Other

## 2015-09-01 ENCOUNTER — Ambulatory Visit (HOSPITAL_COMMUNITY): Payer: Medicare Other | Attending: Physical Medicine & Rehabilitation | Admitting: Speech Pathology

## 2015-09-01 DIAGNOSIS — R52 Pain, unspecified: Secondary | ICD-10-CM | POA: Insufficient documentation

## 2015-09-01 DIAGNOSIS — I69319 Unspecified symptoms and signs involving cognitive functions following cerebral infarction: Secondary | ICD-10-CM | POA: Diagnosis not present

## 2015-09-01 DIAGNOSIS — I69320 Aphasia following cerebral infarction: Secondary | ICD-10-CM

## 2015-09-01 DIAGNOSIS — R29898 Other symptoms and signs involving the musculoskeletal system: Secondary | ICD-10-CM | POA: Diagnosis present

## 2015-09-01 DIAGNOSIS — R2689 Other abnormalities of gait and mobility: Secondary | ICD-10-CM | POA: Insufficient documentation

## 2015-09-01 DIAGNOSIS — R6889 Other general symptoms and signs: Secondary | ICD-10-CM | POA: Diagnosis present

## 2015-09-01 DIAGNOSIS — R269 Unspecified abnormalities of gait and mobility: Secondary | ICD-10-CM | POA: Insufficient documentation

## 2015-09-01 NOTE — Therapy (Signed)
[facility] [address] [place], [place], [address] Phone: [phone]   Fax:  [phone]  Speech Language Pathology Treatment  Patient Details  Name: Holly Sparks MRN: [id number] Date of Birth: [birth year] Referring Provider:  Erick Colace, MD  Encounter Date: 09/01/2015      End of Session - 09/01/15 1716    Visit Number 5   Number of Visits 16   Date for SLP Re-Evaluation 09/06/15   Authorization Type UHC Medicare   Authorization Time Period 07/07/2015-09/06/2015   Authorization - Visit Number 5   Authorization - Number of Visits 16   SLP Start Time 1352   SLP Stop Time  1430   SLP Time Calculation (min) 38 min   Activity Tolerance Patient tolerated treatment well      Past Medical History  Diagnosis Date  . Hypertension     x 10 yrs  . Diabetes (HCC)     Type 2 x 10 yrs  . High cholesterol   . Presence of permanent cardiac pacemaker   . Paroxysmal a-fib: Remote hx of afib 06/15/2015  . Stroke [facility])     Past Surgical History  Procedure Laterality Date  . Neck surgery      x 2 for spur and disc problems  . Colonoscopy    . Colonoscopy N/A 05/08/2013    Procedure: COLONOSCOPY;  Surgeon: Malissa Hippo, MD;  Location: [facility];  Service: Endoscopy;  Laterality: N/A;  1015    There were no vitals filed for this visit.  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 No/denies               ADULT SLP TREATMENT - 09/01/15 1714    General Information   Behavior/Cognition Alert;Cooperative;Pleasant mood   Patient Positioning Upright in chair   Oral care provided N/A   HPI Holly Sparks is a 74 y.o. female with a history of diabetes mellitus, hypertension and hyperlipidemia who takes aspirin 81 mg on a daily basis. She presented to ER  on 06/13/2015 with expressive and receptive aphasia. CT was negative for actue findings but MRI showed:Acute left MCA territory infarct, most confluent involvement in the left basal ganglia. There is basal ganglia petechial hemorrhage, but no significant mass effect. She did not receive IV t-PAdue to late presentation. MBS completed 7/18 and recommended Dys. 3 textures with nectar-thick liquids. Patient transferred to CIR on 7/19 and remained until discharge home on 06/27/2015. A repeat MBSS was completed 06/23/2015 prior to D/C with ongoing recommendation for D3/NTL. Chin tuck eliminated aspiration, however pt was inconsistent with implementation. She made very good progress in CIR, but continues to present with aphasia and cogntive deficits. Pt referred for outpatient SLP therapy to address cognitive linguistic deficits.   Treatment Provided   Treatment provided Cognitive-Linquistic   Pain Assessment   Pain Assessment No/denies pain   Cognitive-Linquistic Treatment   Treatment focused on Aphasia;Cognition;Patient/family/caregiver education   Skilled Treatment generational naming, orientation, word finding strategies, organization, memory strategies   Assessment / Recommendations / Plan   Plan Continue with current plan of care            SLP Short Term Goals - 09/01/15 1726    SLP SHORT TERM GOAL #1   Title Pt will complete moderate  level divergent naming tasks with 80% acc when provided mild/mod cues.   Baseline 25%   Time 4   Period Weeks   Status On-going   SLP SHORT TERM GOAL #2   Title Pt will produce 5-7 word sentences when describing pictures and given mi/mod cues.   Baseline mod/max cues   Time 4   Period Weeks   Status On-going   SLP SHORT TERM GOAL #3   Title Pt will be able to name family members and close friends with 100% acc with use of written cues prn.   Baseline 50% mod cues without written assist   Time 4   Period Weeks   Status On-going   SLP SHORT TERM GOAL #4    Title Pt will be oriented to time, place, and situation with 100% acc when cued to use memory book to locate information.   Baseline 25%   Time 4   Period Weeks   Status On-going   SLP SHORT TERM GOAL #5   Title Pt will participate in ongoing evaluation of auditory and reading comprehension skills as well as follow up of previous dysphagia guidelines/recommendations (was on D3/NTL)   Time 4   Period Weeks   Status On-going          SLP Long Term Goals - 09/01/15 1726    SLP LONG TERM GOAL #1   Title Pt will increase cognitive communication skills to [facility] for intermittently supervised environment with use of strategies as needed.    Baseline mod assist in supervised environment   Time 8   Period Weeks   Status On-going          Plan - 09/01/15 1717    Clinical Impression Statement Holly Sparks saw Dr. Roda Shutters yesterday and expressed feeling very pleased about her appointment. She reports increased anxiety since her stroke and she's not sure why. We discussed the impact of cognitive and expressive language changes s/p stroke and she acknowledges that it may have something to do with it. She brought her notebook to therapy, but did not take it to her appointment yesterday. She was encouraged to bring her notebook to every appointment so that she can keep track of all medical information. Today, I assisted her in creating a list of names of her medical professionals and she was able to name 1 of 7 without cues, 2 of 7 with min cues, and was able to confirm/deny the rest of the names when given choices. Continue working with memory book and target naming in functional tasks next session.    Speech Therapy Frequency 2x / week   Duration --  8 weeks   Treatment/Interventions SLP instruction and feedback;Cueing hierarchy;Cognitive reorganization;Compensatory strategies;Patient/family education;Functional tasks;Multimodal communcation approach;Compensatory techniques   Potential to Achieve Goals  Good   Potential Considerations Other (comment)  Pt somewhat anxious   SLP Home Exercise Plan Pt will complete HEP as assigned to faciliate carryover of treatment strategies and techniques in home/community environment   Consulted and Agree with Plan of Care Patient;Family member/caregiver   Family Member Consulted husband        Problem List Patient Active Problem List   Diagnosis Date Noted  . Paroxysmal atrial fibrillation (HCC) 08/31/2015  . Chronic anticoagulation 08/31/2015  . Type 2 diabetes mellitus with circulatory disorder (HCC) 08/31/2015  . 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 Paroxysmal a-fib: Remote hx of afib 06/15/2015  . Cerebral infarction due to embolism of left middle cerebral artery (HCC)   .  Essential hypertension   . Stroke-like symptoms 06/13/2015  . HLD (hyperlipidemia) 06/13/2015  . DDD (degenerative disc disease), cervical 06/13/2015  . Elevated serum creatinine 06/13/2015  . Aphasia 06/13/2015  . Expressive aphasia   . Cerebral infarction due to unspecified mechanism   . Type 2 diabetes mellitus without complication (HCC)   . IBS (irritable bowel syndrome) 04/24/2013  . High cholesterol 04/24/2013  . Diabetes (HCC) 04/24/2013  . Essential hypertension, benign 04/24/2013  Thank you,  Havery Moros, CCC-SLP [phone]   Havery Moros 09/01/2015, 5:27 PM  [facility] [address] [place], [place], [address] Phone: [phone]   Fax:  [phone]

## 2015-09-01 NOTE — Therapy (Signed)
[facility] [address] [place], [place], [address] Phone: [phone]   Fax:  [phone]  Physical Therapy Treatment  Patient Details  Name: Holly Sparks MRN: [id number] Date of Birth: 1941-02-04 Referring Provider:  Erick Colace, MD  Encounter Date: 09/01/2015      PT End of Session - 09/01/15 1531    Visit Number 11   Number of Visits 18   Date for PT Re-Evaluation 09/09/15   Authorization Type UHC medicare   Authorization Time Period Gcodes done 6th visit   Authorization - Visit Number 11   Authorization - Number of Visits 16   PT Start Time 1433   PT Stop Time 1522   PT Time Calculation (min) 49 min   Equipment Utilized During Treatment Gait belt   Activity Tolerance Patient tolerated treatment well   Behavior During Therapy [facility] for tasks assessed/performed      Past Medical History  Diagnosis Date  . Hypertension     x 10 yrs  . Diabetes (HCC)     Type 2 x 10 yrs  . High cholesterol   . Presence of permanent cardiac pacemaker   . Paroxysmal a-fib: Remote hx of afib 06/15/2015  . Stroke [facility])     Past Surgical History  Procedure Laterality Date  . Neck surgery      x 2 for spur and disc problems  . Colonoscopy    . Colonoscopy N/A 05/08/2013    Procedure: COLONOSCOPY;  Surgeon: Malissa Hippo, MD;  Location[facility];  Service: Endoscopy;  Laterality: N/A;  1015    There were no vitals filed for this visit.  Visit Diagnosis:  Abnormality of gait  Weakness of both legs  Poor balance  Decreased functional activity tolerance  Pain      Subjective Assessment - 09/01/15 1501    Subjective Pt reported going to neurologist yesterday, goes back in 3 months.  Reports dizziness at least once a week.   Currently in Pain? No/denies          [facility] Adult PT Treatment/Exercise - 09/01/15 0001    Exercises   Exercises Knee/Hip   Lumbar Exercises: Aerobic   Stationary Bike nustep hills 3  level 2 x 10:00 UE and LE to improve UE sequence with gait at beginning of session   Knee/Hip Exercises: Standing   Gait Training Gait training x520 with dowel rods B hands to improve UE sequence and trunk rotation             Balance Exercises - 09/01/15 1538    Balance Exercises: Standing   [facility] Rotation Foam/compliant surface;R/L   Marching Limitations 15x 3-5" holds with opposite UE/LE first stationary then with gait   Sit to Stand Time 10             PT Short Term Goals - 08/12/15 1046    PT SHORT TERM GOAL #1   Title I HEP   Time 2   Period Weeks   Status On-going   PT SHORT TERM GOAL #2   Title Pt to be able to complete 6 Sit to stand in 30 seconds to demonstate improved power for easier sit to stand    Time 2   Period Weeks   Status On-going   PT SHORT TERM GOAL #3   Title Pt to be ambulating 20 minutes at a time for improved health habits.   Time 2   Period Weeks  Status Achieved   PT SHORT TERM GOAL #4   Title Pt TUG to be  17 or less to reduce risk of falls    Time 3   Period Weeks   Status On-going           PT Long Term Goals - 08/12/15 1047    PT LONG TERM GOAL #1   Title I in advance HEP   Time 6   Period Weeks   Status On-going   PT LONG TERM GOAL #2   Title Pt to show no unsteadiness when ambulating without assistive device including turning head and turns   Time 6   Period Weeks   Status On-going   PT LONG TERM GOAL #3   Title Pt to be walking for 30 minutes a day for better health habitis   Time 6   Period Weeks   Status On-going   PT LONG TERM GOAL #4   Title Pt strength to be improved one grade to allow pt to squat to ground and retrun without difficutly   Time 6   Period Weeks   Status On-going               Plan - 09/01/15 1547    Clinical Impression Statement Session foucs on improving trunk rotation and appropraite UE sequence with gait mechanics.  Began session with nustep to improve sequence with UE and LE,  continued with trunk rotation activities with less assistance required for balance.  Added high marching with opposite UE flexion to improve SLS and trunk rotaition.  Pt with c/o dizziness initially following Nustep, BP taken 148/76 mmHg.  Continued with dowel rods BUE to facilitation appropriate arm swimg with mod-max verbal cueing required to improve gait mechanics.  No reports of pain through session, pt was limited by fatgiue at end of session.     PT Next Visit Plan focus on balance, increasing trunk rotation and arm swing during ambulation        Problem List Patient Active Problem List   Diagnosis Date Noted  . Paroxysmal atrial fibrillation (HCC) 08/31/2015  . Chronic anticoagulation 08/31/2015  . Type 2 diabetes mellitus with circulatory disorder (HCC) 08/31/2015  . 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 Paroxysmal a-fib: Remote hx of afib 06/15/2015  . Cerebral infarction due to embolism of left middle cerebral artery (HCC)   . Essential hypertension   . Stroke-like symptoms 06/13/2015  . HLD (hyperlipidemia) 06/13/2015  . DDD (degenerative disc disease), cervical 06/13/2015  . Elevated serum creatinine 06/13/2015  . Aphasia 06/13/2015  . Expressive aphasia   . Cerebral infarction due to unspecified mechanism   . Type 2 diabetes mellitus without complication (HCC)   . IBS (irritable bowel syndrome) 04/24/2013  . High cholesterol 04/24/2013  . Diabetes (HCC) 04/24/2013  . Essential hypertension, benign 04/24/2013    Becky Sax, LPTA; CBIS [phone]   Juel Burrow 09/01/2015, 4:18 PM  [facility] [address] [place], [place], [address] Phone: [phone]   Fax:  [phone]

## 2015-09-03 ENCOUNTER — Other Ambulatory Visit: Payer: Self-pay | Admitting: *Deleted

## 2015-09-03 ENCOUNTER — Ambulatory Visit (HOSPITAL_COMMUNITY): Payer: Medicare Other | Admitting: Speech Pathology

## 2015-09-03 ENCOUNTER — Ambulatory Visit (HOSPITAL_COMMUNITY): Payer: Medicare Other

## 2015-09-03 DIAGNOSIS — I69319 Unspecified symptoms and signs involving cognitive functions following cerebral infarction: Secondary | ICD-10-CM | POA: Diagnosis not present

## 2015-09-03 DIAGNOSIS — I69320 Aphasia following cerebral infarction: Secondary | ICD-10-CM

## 2015-09-03 DIAGNOSIS — R2689 Other abnormalities of gait and mobility: Secondary | ICD-10-CM

## 2015-09-03 DIAGNOSIS — R29898 Other symptoms and signs involving the musculoskeletal system: Secondary | ICD-10-CM

## 2015-09-03 DIAGNOSIS — R269 Unspecified abnormalities of gait and mobility: Secondary | ICD-10-CM

## 2015-09-03 DIAGNOSIS — R6889 Other general symptoms and signs: Secondary | ICD-10-CM

## 2015-09-03 NOTE — Therapy (Signed)
[place] [facility] [address] [place], [place], [address] Phone: [phone]   Fax:  [phone]  Physical Therapy Treatment  Patient Details  Name: Holly Sparks MRN: [id number] Date of Birth: [birth year] Referring Provider:  Erick Colace, MD  Encounter Date: 09/03/2015      PT End of Session - 09/03/15 1517    Visit Number 12   Number of Visits 18   Date for PT Re-Evaluation 09/09/15   Authorization Type UHC medicare   Authorization Time Period Gcodes done 6th visit   Authorization - Visit Number 11   Authorization - Number of Visits 16   PT Start Time 1432   PT Stop Time 1515   PT Time Calculation (min) 43 min   Equipment Utilized During Treatment Gait belt   Activity Tolerance Patient tolerated treatment well  1 dizzy episode   Behavior During Therapy [facility] for tasks assessed/performed      Past Medical History  Diagnosis Date  . Hypertension     x 10 yrs  . Diabetes (HCC)     Type 2 x 10 yrs  . High cholesterol   . Presence of permanent cardiac pacemaker   . Paroxysmal a-fib: Remote hx of afib 06/15/2015  . Stroke [facility])     Past Surgical History  Procedure Laterality Date  . Neck surgery      x 2 for spur and disc problems  . Colonoscopy    . Colonoscopy N/A 05/08/2013    Procedure: COLONOSCOPY;  Surgeon: Malissa Hippo, MD;  [facility];  Service: Endoscopy;  Laterality: N/A;  1015    There were no vitals filed for this visit.  Visit Diagnosis:  Abnormality of gait  Weakness of both legs  Poor balance  Decreased functional activity tolerance      Subjective Assessment - 09/03/15 1515    Subjective Feeling okay, no reports of pain today.     Currently in Pain? No/denies                         [facility] Adult PT Treatment/Exercise - 09/03/15 0001    Ambulation/Gait   Ambulation/Gait Yes   Ambulation/Gait Assistance 4: Min guard   Ambulation Distance (Feet) 450 Feet   Assistive device None   Gait Comments Increased VC for arm swing with increased fatigue   Lumbar Exercises: Aerobic   Stationary Bike nustep hills 3 level 2 x 10:00 UE and LE to improve UE sequence with gait at beginning of session             Balance Exercises - 09/03/15 1516    Balance Exercises: Standing   Step Over Hurdles / Cones 6in alternating 2RT   [facility] Rotation Foam/compliant surface;R/L   Marching Limitations 15x 3-5" holds with opposite UE/LE first stationary then with gait   Sit to Stand Time 10             PT Short Term Goals - 08/12/15 1046    PT SHORT TERM GOAL #1   Title I HEP   Time 2   Period Weeks   Status On-going   PT SHORT TERM GOAL #2   Title Pt to be able to complete 6 Sit to stand in 30 seconds to demonstate improved power for easier sit to stand    Time 2   Period Weeks   Status On-going   PT SHORT TERM GOAL #3   Title  Pt to be ambulating 20 minutes at a time for improved health habits.   Time 2   Period Weeks   Status Achieved   PT SHORT TERM GOAL #4   Title Pt TUG to be  17 or less to reduce risk of falls    Time 3   Period Weeks   Status On-going           PT Long Term Goals - 08/12/15 1047    PT LONG TERM GOAL #1   Title I in advance HEP   Time 6   Period Weeks   Status On-going   PT LONG TERM GOAL #2   Title Pt to show no unsteadiness when ambulating without assistive device including turning head and turns   Time 6   Period Weeks   Status On-going   PT LONG TERM GOAL #3   Title Pt to be walking for 30 minutes a day for better health habitis   Time 6   Period Weeks   Status On-going   PT LONG TERM GOAL #4   Title Pt strength to be improved one grade to allow pt to squat to ground and retrun without difficutly   Time 6   Period Weeks   Status On-going               Plan - 09/03/15 1750    Clinical Impression Statement Continued session focus on improving trunk rotation with gait and balance training.   Less cueing required with appropraite arm swing this session initially with gait training though did require more cueing with increased fatigue.  Added hurdles this session to improve SLS activities with balance.  Pt c/o dizziness with new activity, seated rest break upon report of symptoms and BP taken 117/70 mmHg.  Pt reported she had not eaten a lot prior therapy today and increased fatigue at end of session.  No reoprts of pain.     PT Next Visit Plan focus on balance, increasing trunk rotation and arm swing during ambulation        Problem List Patient Active Problem List   Diagnosis Date Noted  . Paroxysmal atrial fibrillation (HCC) 08/31/2015  . Chronic anticoagulation 08/31/2015  . Type 2 diabetes mellitus with circulatory disorder (HCC) 08/31/2015  . 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 Paroxysmal a-fib: Remote hx of afib 06/15/2015  . Cerebral infarction due to embolism of left middle cerebral artery (HCC)   . Essential hypertension   . Stroke-like symptoms 06/13/2015  . HLD (hyperlipidemia) 06/13/2015  . DDD (degenerative disc disease), cervical 06/13/2015  . Elevated serum creatinine 06/13/2015  . Aphasia 06/13/2015  . Expressive aphasia   . Cerebral infarction due to unspecified mechanism   . Type 2 diabetes mellitus without complication (HCC)   . IBS (irritable bowel syndrome) 04/24/2013  . High cholesterol 04/24/2013  . Diabetes (HCC) 04/24/2013  . Essential hypertension, benign 04/24/2013  Holly Sparks, LPTA; CBIS [phone]   Juel Burrow 09/03/2015, 5:55 PM  [place] [facility] [address] [place], [place], [address] Phone: [phone]   Fax:  [phone]

## 2015-09-03 NOTE — Addendum Note (Signed)
Addended by: Dorene Ar on: 09/03/2015 05:17 PM   Modules accepted: Orders

## 2015-09-03 NOTE — Therapy (Addendum)
[place], [place], [address] Phone: [phone]   Fax:  [phone]  Speech Language Pathology Treatment  Patient Details  Name: Holly Sparks MRN: [id number] Date of Birth: Apr 27, 1941 Referring Provider:  Lemmie Evens, MD  Encounter Date: 09/03/2015  Speech Therapy Progress Note  Dates of Reporting Period: 06/26/2015 to 09/03/2015  Goal Update: Pt met 2/5 goals and partially met the remaining 3  Plan: Continue 2x week for 4 weeks  Reason Skilled Services are Required: Good progress toward goals so far, pt only able to attend 6 of 16 appointments due to scheduling conflict. Pt would benefit from additional sessions to increase independence with communication/memory book.      End of Session - 09/03/15 1648    Visit Number 6   Number of Visits 16   Date for SLP Re-Evaluation 09/06/15   Authorization Type UHC Medicare   Authorization Time Period 07/07/2015-09/06/2015   Authorization - Visit Number 6   Authorization - Number of Visits 16   SLP Start Time 9417   SLP Stop Time  1622   SLP Time Calculation (min) 52 min   Activity Tolerance Patient tolerated treatment well      Past Medical History  Diagnosis Date  . Hypertension     x 10 yrs  . Diabetes ([place])     Type 2 x 10 yrs  . High cholesterol   . Presence of permanent cardiac pacemaker   . Paroxysmal a-fib: Remote hx of afib 06/15/2015  . Stroke [facility])     Past Surgical History  Procedure Laterality Date  . Neck surgery      x 2 for spur and disc problems  . Colonoscopy    . Colonoscopy N/A 05/08/2013    Procedure: COLONOSCOPY;  Surgeon: Rogene Houston, MD;  [facility];  Service: Endoscopy;  Laterality: N/A;  1015    There were no vitals filed for this visit.  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 No/denies           ADULT SLP TREATMENT - 09/03/15 1621    General Information   Behavior/Cognition Alert;Cooperative;Pleasant mood   Patient Positioning Upright in chair   Oral care provided N/A   HPI LASHAI GROSCH is a 74 y.o. female with a history of diabetes mellitus, hypertension and hyperlipidemia who takes aspirin 81 mg on a daily basis. She presented to ER on 06/13/2015 with expressive and receptive aphasia. CT was negative for actue findings but MRI showed:Acute left MCA territory infarct, most confluent involvement in the left basal ganglia. There is basal ganglia petechial hemorrhage, but no significant mass effect. She did not receive IV t-PAdue to late presentation. MBS completed 7/18 and recommended Dys. 3 textures with nectar-thick liquids. Patient transferred to CIR on 7/19 and remained until discharge home on 06/27/2015. A repeat MBSS was completed 06/23/2015 prior to D/C with ongoing recommendation for D3/NTL. Chin tuck eliminated aspiration, however pt was inconsistent with implementation. She made very good progress in CIR, but continues to present with aphasia and cogntive deficits. Pt referred for outpatient SLP therapy to address cognitive linguistic deficits.   Treatment Provided   Treatment provided Cognitive-Linquistic   Pain Assessment   Pain Assessment No/denies pain   Cognitive-Linquistic Treatment   Treatment focused on Aphasia;Cognition;Patient/family/caregiver  education   Skilled Treatment generational naming, orientation, word finding strategies, organization, memory strategies   Assessment / Recommendations / Plan   Plan Continue with current plan of care          SLP Education - 09/03/15 1622    Education provided Yes   Education Details Reinforcement of use of memory/communication book- asked husband to fill in missing information   Person(s) Educated Patient   Methods Explanation;Demonstration   Comprehension  Verbalized understanding          SLP Short Term Goals - 09/03/15 1705    SLP SHORT TERM GOAL #1   Title Pt will complete moderate level divergent naming tasks with 80% acc when provided mild/mod cues.   Baseline 25%   Time 4   Period Weeks   Status Partially Met   SLP SHORT TERM GOAL #2   Title Pt will produce 5-7 word sentences when describing pictures and given mi/mod cues.   Baseline mod/max cues   Time 4   Period Weeks   Status Achieved   SLP SHORT TERM GOAL #3   Title Pt will be able to name family members and close friends with 100% acc with use of written cues prn.   Baseline 50% mod cues without written assist   Time 4   Period Weeks   Status Partially Met   SLP SHORT TERM GOAL #4   Title Pt will be oriented to time, place, and situation with 100% acc when cued to use memory book to locate information.   Baseline 25%   Time 4   Period Weeks   Status Partially Met   SLP SHORT TERM GOAL #5   Title Pt will participate in ongoing evaluation of auditory and reading comprehension skills as well as follow up of previous dysphagia guidelines/recommendations (was on D3/NTL)   Time 4   Period Weeks   Status Achieved          SLP Long Term Goals - 09/03/15 1706    SLP LONG TERM GOAL #1   Title Pt will increase cognitive communication skills to [facility] for intermittently supervised environment with use of strategies as needed.    Baseline mod assist in supervised environment   Time 8   Period Weeks   Status On-going          Plan - 09/03/15 1649    Clinical Impression Statement Sesssion focused on practical implementation of memory/communication book. Pt assissted SLP in providing names of family members, close friends, and restaurants she and her husband frequent. She often gives a related, but incorrect answer and becomes frustrated when she can't think of the correct answer. For example, she provided three names when telling me the names of her grandson triplets, two  being correct. The incorrect name she gave was actually the children's last name. She needed mod/max cues to come up with restaurants she visits in town. Calendar had not been updated (she was asked to review daily and cross off previous days), but she does report reviewing the schedule and had questions about why appointments stopped on October 18. Although her husband did not accompany her to therapy, I spoke with him in her presence after our session and explained the reason for her notebook. He reported concerns about her long term memory and I conveyed that sometimes it is difficult to determine if it is a long term memory problem or a language/word finding problem. She does appear to have some components of both and  benefits from written cues. Recommend continued SLP therapy to address deficits, increase independence with communication, and decrease burden of care. Pt only scheduled for 4 of the last 8 weeks due to scheduling conflict.   Speech Therapy Frequency 2x / week   Duration 4 weeks   Treatment/Interventions SLP instruction and feedback;Cueing hierarchy;Cognitive reorganization;Compensatory strategies;Patient/family education;Functional tasks;Multimodal communcation approach;Compensatory techniques   Potential to Achieve Goals Good   Potential Considerations Other (comment)  Pt somewhat anxious   SLP Home Exercise Plan Pt will complete HEP as assigned to faciliate carryover of treatment strategies and techniques in home/community environment   Consulted and Agree with Plan of Care Patient;Family member/caregiver   Family Member Consulted husband        Problem List Patient Active Problem List   Diagnosis Date Noted  . Paroxysmal atrial fibrillation ([place]) 08/31/2015  . Chronic anticoagulation 08/31/2015  . Type 2 diabetes mellitus with circulatory disorder ([place]) 08/31/2015  . Left middle cerebral artery stroke ([place]) 06/16/2015  . Paroxysmal a-fib: Remote hx of afib 06/15/2015  .  Cerebral infarction due to embolism of left middle cerebral artery ([place])   . Essential hypertension   . Stroke-like symptoms 06/13/2015  . HLD (hyperlipidemia) 06/13/2015  . DDD (degenerative disc disease), cervical 06/13/2015  . Elevated serum creatinine 06/13/2015  . Aphasia 06/13/2015  . Expressive aphasia   . Cerebral infarction due to unspecified mechanism   . Type 2 diabetes mellitus without complication ([place])   . IBS (irritable bowel syndrome) 04/24/2013  . High cholesterol 04/24/2013  . Diabetes ([place]) 04/24/2013  . Essential hypertension, benign 04/24/2013    Thank you,  Genene Churn, [place]  [facility] 09/03/2015, 5:07 PM  [place] [address] [place], [place], [address] Phone: [phone]   Fax:  [phone]

## 2015-09-08 ENCOUNTER — Ambulatory Visit (HOSPITAL_COMMUNITY): Payer: Medicare Other | Admitting: Speech Pathology

## 2015-09-08 ENCOUNTER — Ambulatory Visit (HOSPITAL_COMMUNITY): Payer: Medicare Other

## 2015-09-08 DIAGNOSIS — R269 Unspecified abnormalities of gait and mobility: Secondary | ICD-10-CM

## 2015-09-08 DIAGNOSIS — I69319 Unspecified symptoms and signs involving cognitive functions following cerebral infarction: Secondary | ICD-10-CM

## 2015-09-08 DIAGNOSIS — R29898 Other symptoms and signs involving the musculoskeletal system: Secondary | ICD-10-CM

## 2015-09-08 DIAGNOSIS — R6889 Other general symptoms and signs: Secondary | ICD-10-CM

## 2015-09-08 DIAGNOSIS — R52 Pain, unspecified: Secondary | ICD-10-CM

## 2015-09-08 DIAGNOSIS — R2689 Other abnormalities of gait and mobility: Secondary | ICD-10-CM

## 2015-09-08 DIAGNOSIS — I69320 Aphasia following cerebral infarction: Secondary | ICD-10-CM

## 2015-09-08 NOTE — Therapy (Signed)
[place] [facility] [address] [place], [place], [address] Phone: [phone]   Fax:  [phone]  Speech Language Pathology Treatment  Patient Details  Name: Holly Sparks MRN: [id number] Date of Birth: 26-Sep-1941 Referring Provider:  Erick Colace, MD  Encounter Date: 09/08/2015      End of Session - 09/08/15 1408    Visit Number 7   Number of Visits 16   Date for SLP Re-Evaluation 09/06/15   Authorization Type UHC Medicare   Authorization Time Period 07/07/2015-09/06/2015   Authorization - Visit Number 7   Authorization - Number of Visits 16   SLP Start Time 1348   SLP Stop Time  1430   SLP Time Calculation (min) 42 min   Activity Tolerance Patient tolerated treatment well      Past Medical History  Diagnosis Date  . Hypertension     x 10 yrs  . Diabetes (HCC)     Type 2 x 10 yrs  . High cholesterol   . Presence of permanent cardiac pacemaker   . Paroxysmal a-fib: Remote hx of afib 06/15/2015  . Stroke [facility])     Past Surgical History  Procedure Laterality Date  . Neck surgery      x 2 for spur and disc problems  . Colonoscopy    . Colonoscopy N/A 05/08/2013    Procedure: COLONOSCOPY;  Surgeon: Malissa Hippo, MD;  [facility];  Service: Endoscopy;  Laterality: N/A;  1015    There were no vitals filed for this visit.  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 No/denies               ADULT SLP TREATMENT - 09/08/15 1407    General Information   Behavior/Cognition Alert;Cooperative;Pleasant mood   Patient Positioning Upright in chair   Oral care provided N/A   HPI Holly Sparks is a 74 y.o. female with a history of diabetes mellitus, hypertension and hyperlipidemia who takes aspirin 81 mg on a daily basis. She presented to ER on  06/13/2015 with expressive and receptive aphasia. CT was negative for actue findings but MRI showed:Acute left MCA territory infarct, most confluent involvement in the left basal ganglia. There is basal ganglia petechial hemorrhage, but no significant mass effect. She did not receive IV t-PAdue to late presentation. MBS completed 7/18 and recommended Dys. 3 textures with nectar-thick liquids. Patient transferred to CIR on 7/19 and remained until discharge home on 06/27/2015. A repeat MBSS was completed 06/23/2015 prior to D/C with ongoing recommendation for D3/NTL. Chin tuck eliminated aspiration, however Holly Sparks was inconsistent with implementation. She made very good progress in CIR, but continues to present with aphasia and cogntive deficits. Holly Sparks referred for outpatient SLP therapy to address cognitive linguistic deficits.   Treatment Provided   Treatment provided Cognitive-Linquistic   Pain Assessment   Pain Assessment No/denies pain   Cognitive-Linquistic Treatment   Treatment focused on Aphasia;Cognition;Patient/family/caregiver education   Skilled Treatment generational naming, orientation, word finding strategies, organization, memory strategies   Assessment / Recommendations / Plan   Plan Continue with current plan of care            SLP Short Term Goals - 09/08/15 1419    SLP SHORT TERM GOAL #1   Title Holly Sparks will complete moderate  level divergent naming tasks with 80% acc when provided mild/mod cues.   Baseline 25%   Time 4   Period Weeks   Status On-going   SLP SHORT TERM GOAL #2   Title Holly Sparks will produce 5-7 word sentences when describing pictures and given mi/mod cues.   Baseline mod/max cues   Time 4   Period Weeks   Status Achieved   SLP SHORT TERM GOAL #3   Title Holly Sparks will be able to name family members and close friends with 100% acc with use of written cues prn.   Baseline 50% mod cues without written assist   Time 4   Period Weeks   Status On-going   SLP SHORT TERM GOAL #4    Title Holly Sparks will be oriented to time, place, and situation with 100% acc when cued to use memory book to locate information.   Baseline 25%   Time 4   Period Weeks   Status On-going   SLP SHORT TERM GOAL #5   Title Holly Sparks will increase oral reading of numbers and short sentences (5-7 words) to 85% acc with given min cues   Baseline 50% numbers, 70% sentences   Time 4   Period Weeks   Status On-going          SLP Long Term Goals - 09/08/15 1441    SLP LONG TERM GOAL #1   Title Holly Sparks will increase cognitive communication skills to [facility] for intermittently supervised environment with use of strategies as needed.    Baseline mod assist in supervised environment   Time 8   Period Weeks   Status On-going          Plan - 09/08/15 1418    Clinical Impression Statement Holly Sparks had not updated her calendar over the week end. The session was spent navigating her notebook, for which she required moderate verbal cues. She was cued to try to answer questions regarding family member names without using written cues and then using written cues when needed. She completed naming activities with 90% acc with min cues. When answering questions regarding dates/appointments, she was reminded to look in her notebook for the calendar. When reading from the calendar, she verbalized "August 22" when pointing to October 11 and needed moderate cues to recognize the error (benefitted from written cues stating each). She was encouraged to use compensatory strategy of starting from 1 and counting up until she reached the number she intended. Her errors today reveal more language errors than memory errors, however the can appear like memory impairment due to decreased error awareness. Next session, focus on oral reading tasks.   Speech Therapy Frequency 2x / week   Duration 4 weeks   Treatment/Interventions SLP instruction and feedback;Cueing hierarchy;Cognitive reorganization;Compensatory strategies;Patient/family  education;Functional tasks;Multimodal communcation approach;Compensatory techniques   Potential to Achieve Goals Good   Potential Considerations Other (comment)  Holly Sparks somewhat anxious   SLP Home Exercise Plan Holly Sparks will complete HEP as assigned to faciliate carryover of treatment strategies and techniques in home/community environment   Consulted and Agree with Plan of Care Patient;Family member/caregiver   Family Member Consulted husband        Problem List Patient Active Problem List   Diagnosis Date Noted  . Paroxysmal atrial fibrillation (HCC) 08/31/2015  . Chronic anticoagulation 08/31/2015  . Type 2 diabetes mellitus with circulatory disorder (HCC) 08/31/2015  . 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 Paroxysmal a-fib: Remote hx of afib 06/15/2015  . Cerebral infarction  due to embolism of left middle cerebral artery (HCC)   . Essential hypertension   . Stroke-like symptoms 06/13/2015  . HLD (hyperlipidemia) 06/13/2015  . DDD (degenerative disc disease), cervical 06/13/2015  . Elevated serum creatinine 06/13/2015  . Aphasia 06/13/2015  . Expressive aphasia   . Cerebral infarction due to unspecified mechanism   . Type 2 diabetes mellitus without complication (HCC)   . IBS (irritable bowel syndrome) 04/24/2013  . High cholesterol 04/24/2013  . Diabetes (HCC) 04/24/2013  . Essential hypertension, benign 04/24/2013   Thank you,  Havery Moros, CCC-SLP [phone]  [facility] 09/08/2015, 2:44 PM  [place] [facility] [address] [place], [place], [address] Phone: [phone]   Fax:  [phone]

## 2015-09-08 NOTE — Therapy (Signed)
[facility] [address] [place], [place], [address] Phone: [phone]   Fax:  [phone]  Physical Therapy Treatment  Patient Details  Name: Holly Sparks MRN: [id number] Date of Birth: 1941-10-22 Referring Provider:  Erick Colace, MD  Encounter Date: 09/08/2015      PT End of Session - 09/08/15 1552    Visit Number 13   Number of Visits 18   Date for PT Re-Evaluation 09/09/15   Authorization Type UHC medicare   Authorization Time Period Gcodes done 6th visit   Authorization - Visit Number 13   Authorization - Number of Visits 16   PT Start Time 1436   PT Stop Time 1520   PT Time Calculation (min) 44 min   Equipment Utilized During Treatment Gait belt   Activity Tolerance Patient tolerated treatment well;Patient limited by fatigue   Behavior During Therapy [facility] for tasks assessed/performed      Past Medical History  Diagnosis Date  . Hypertension     x 10 yrs  . Diabetes (HCC)     Type 2 x 10 yrs  . High cholesterol   . Presence of permanent cardiac pacemaker   . Paroxysmal a-fib: Remote hx of afib 06/15/2015  . Stroke [facility])     Past Surgical History  Procedure Laterality Date  . Neck surgery      x 2 for spur and disc problems  . Colonoscopy    . Colonoscopy N/A 05/08/2013    Procedure: COLONOSCOPY;  Surgeon: Malissa Hippo, MD;  Location: [facility];  Service: Endoscopy;  Laterality: N/A;  1015    There were no vitals filed for this visit.  Visit Diagnosis:  Abnormality of gait  Weakness of both legs  Poor balance  Decreased functional activity tolerance  Pain      Subjective Assessment - 09/08/15 1429    Subjective Pt stated she has increased dizziness today   Currently in Pain? No/denies          [facility] Adult PT Treatment/Exercise - 09/08/15 0001    Exercises   Exercises Knee/Hip   Lumbar Exercises: Aerobic   Stationary Bike nustep hills 3 level 2 x 10:00 UE and LE to improve UE  sequence with gait at beginning of session   Lumbar Exercises: Standing   Other Standing Lumbar Exercises cone rotation on foam x 2.   Knee/Hip Exercises: Standing   Gait Training Gait training [phone] with dowel rods B hands to improve UE sequence and trunk rotation   Other Standing Knee Exercises marching in place    Other Standing Knee Exercises tap up at 6" box x 10 bilat with no UE support             Balance Exercises - 09/08/15 1513    Balance Exercises: Standing   Step Over Hurdles / Cones 6in alternating 2RT   Cone Rotation Foam/compliant surface;R/L             PT Short Term Goals - 08/12/15 1046    PT SHORT TERM GOAL #1   Title I HEP   Time 2   Period Weeks   Status On-going   PT SHORT TERM GOAL #2   Title Pt to be able to complete 6 Sit to stand in 30 seconds to demonstate improved power for easier sit to stand    Time 2   Period Weeks   Status On-going   PT SHORT TERM GOAL #3  Title Pt to be ambulating 20 minutes at a time for improved health habits.   Time 2   Period Weeks   Status Achieved   PT SHORT TERM GOAL #4   Title Pt TUG to be  17 or less to reduce risk of falls    Time 3   Period Weeks   Status On-going           PT Long Term Goals - 08/12/15 1047    PT LONG TERM GOAL #1   Title I in advance HEP   Time 6   Period Weeks   Status On-going   PT LONG TERM GOAL #2   Title Pt to show no unsteadiness when ambulating without assistive device including turning head and turns   Time 6   Period Weeks   Status On-going   PT LONG TERM GOAL #3   Title Pt to be walking for 30 minutes a day for better health habitis   Time 6   Period Weeks   Status On-going   PT LONG TERM GOAL #4   Title Pt strength to be improved one grade to allow pt to squat to ground and retrun without difficutly   Time 6   Period Weeks   Status On-going               Plan - 09/08/15 1800    Clinical Impression Statement Pt. with increased fatigue through  session requiring more rest breaks than usual and assistance for LOB during activities.  BP taken upon entrance with reports of increased dizziness with BP 99/73 mmHg and HR at 92.  No reports of dizziness through session.  Session foucs on improving balance on dynamic surfaces and BERG related activities including top tapping to improve SLS with min assistance required.  Gait training to improve trunk rotation and arm swing with dowel rods utilized to improve swing with opposite heel strike with min to mod multimodal cueing required.  Pt very fatgiued at end of session, no reports of dizziness or pain,.     PT Next Visit Plan focus on balance, increasing trunk rotation and arm swing during ambulation        Problem List Patient Active Problem List   Diagnosis Date Noted  . Paroxysmal atrial fibrillation (HCC) 08/31/2015  . Chronic anticoagulation 08/31/2015  . Type 2 diabetes mellitus with circulatory disorder (HCC) 08/31/2015  . 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 Paroxysmal a-fib: Remote hx of afib 06/15/2015  . Cerebral infarction due to embolism of left middle cerebral artery (HCC)   . Essential hypertension   . Stroke-like symptoms 06/13/2015  . HLD (hyperlipidemia) 06/13/2015  . DDD (degenerative disc disease), cervical 06/13/2015  . Elevated serum creatinine 06/13/2015  . Aphasia 06/13/2015  . Expressive aphasia   . Cerebral infarction due to unspecified mechanism   . Type 2 diabetes mellitus without complication (HCC)   . IBS (irritable bowel syndrome) 04/24/2013  . High cholesterol 04/24/2013  . Diabetes (HCC) 04/24/2013  . Essential hypertension, benign 04/24/2013   Becky Sax, LPTA; CBIS [phone]  Juel Burrow 09/08/2015, 6:06 PM  [facility] [address] [place], [place], [address] Phone: [phone]   Fax:  [phone]

## 2015-09-09 ENCOUNTER — Ambulatory Visit (INDEPENDENT_AMBULATORY_CARE_PROVIDER_SITE_OTHER): Payer: Medicare Other | Admitting: Cardiology

## 2015-09-09 ENCOUNTER — Encounter: Payer: Self-pay | Admitting: Cardiology

## 2015-09-09 VITALS — BP 110/68 | HR 69 | Ht 63.0 in | Wt 142.8 lb

## 2015-09-09 DIAGNOSIS — I480 Paroxysmal atrial fibrillation: Secondary | ICD-10-CM | POA: Diagnosis not present

## 2015-09-09 DIAGNOSIS — I10 Essential (primary) hypertension: Secondary | ICD-10-CM | POA: Diagnosis not present

## 2015-09-09 NOTE — Progress Notes (Signed)
Patient ID: Holly Sparks, female   DOB: 1941-09-07, 74 y.o.   MRN: [id number]     Clinical Summary Holly Sparks is a 74 y.o.female seen today as a new patient for the following medical problems.  1. Afib - remote history of afib in 2004. Recent MCA stroke 05/2015 thought to be related. Afib was noted on EKG during that admission - has been on rate control and anticoagulation.  - denies any recent palpitations. Denies any bleeding issues on eliquis. Compliant with meds  2. Hx of CVA - 05/2015 found to have acute left MCA stroke   Past Medical History  Diagnosis Date  . Hypertension     x 10 yrs  . Diabetes ([place])     Type 2 x 10 yrs  . High cholesterol   . Presence of permanent cardiac pacemaker   . Paroxysmal a-fib: Remote hx of afib 06/15/2015  . Stroke [facility])      Allergies  Allergen Reactions  . Prednisone Nausea And Vomiting     Current Outpatient Prescriptions  Medication Sig Dispense Refill  . ACCU-CHEK SOFTCLIX LANCETS lancets     . apixaban (ELIQUIS) 5 MG TABS tablet Take 1 tablet (5 mg total) by mouth 2 (two) times daily. 60 tablet 1  . atorvastatin (LIPITOR) 20 MG tablet Take 1 tablet (20 mg total) by mouth at bedtime. 30 tablet 1  . Blood Glucose Monitoring Suppl (ACCU-CHEK AVIVA PLUS) W/DEVICE KIT     . FLUoxetine (PROZAC) 10 MG capsule     . insulin glargine (LANTUS) 100 UNIT/ML injection Inject 0.2 mLs (20 Units total) into the skin at bedtime. (Patient taking differently: Inject 14 Units into the skin at bedtime. ) 10 mL 11  . lisinopril-hydrochlorothiazide (PRINZIDE,ZESTORETIC) 20-25 MG per tablet Take 1 tablet by mouth daily. For high blood pressure 30 tablet 1  . LORazepam (ATIVAN) 0.5 MG tablet Take 1 tablet (0.5 mg total) by mouth 4 (four) times daily as needed for anxiety. 30 tablet 0  . metFORMIN (GLUCOPHAGE) 1000 MG tablet Take 500 mg by mouth 2 (two) times daily with a meal.    . metoprolol (LOPRESSOR) 50 MG tablet Take 0.5 tablets (25 mg total) by  mouth daily. (Patient taking differently: Take 12.5 mg by mouth daily. ) 30 tablet 1  . traMADol (ULTRAM) 50 MG tablet Take 1 tablet (50 mg total) by mouth every 6 (six) hours as needed. 60 tablet 0   No current facility-administered medications for this visit.     Past Surgical History  Procedure Laterality Date  . Neck surgery      x 2 for spur and disc problems  . Colonoscopy    . Colonoscopy N/A 05/08/2013    Procedure: COLONOSCOPY;  Surgeon: Rogene Houston, MD;  Location[facility];  Service: Endoscopy;  Laterality: N/A;  1015     Allergies  Allergen Reactions  . Prednisone Nausea And Vomiting      No family history on file.   Social History Holly Sparks reports that she has never smoked. She does not have any smokeless tobacco history on file. Holly Sparks reports that she does not drink alcohol.   Review of Systems CONSTITUTIONAL: No weight loss, fever, chills, weakness or fatigue.  HEENT: Eyes: No visual loss, blurred vision, double vision or yellow sclerae.No hearing loss, sneezing, congestion, runny nose or sore throat.  SKIN: No rash or itching.  CARDIOVASCULAR: per HPI RESPIRATORY: No shortness of breath, cough or sputum.  GASTROINTESTINAL: No  anorexia, nausea, vomiting or diarrhea. No abdominal pain or blood.  GENITOURINARY: No burning on urination, no polyuria NEUROLOGICAL: No headache, dizziness, syncope, paralysis, ataxia, numbness or tingling in the extremities. No change in bowel or bladder control.  MUSCULOSKELETAL: No muscle, back pain, joint pain or stiffness.  LYMPHATICS: No enlarged nodes. No history of splenectomy.  PSYCHIATRIC: No history of depression or anxiety.  ENDOCRINOLOGIC: No reports of sweating, cold or heat intolerance. No polyuria or polydipsia.  Marland Kitchen   Physical Examination Filed Vitals:   09/09/15 0847  BP: 110/68  Pulse: 69   Filed Vitals:   09/09/15 0847  Height: _0  (1.6 m)  Weight: 142 lb 12.8 oz (64.774 kg)    Gen:  resting comfortably, no acute distress HEENT: no scleral icterus, pupils equal round and reactive, no palptable cervical adenopathy,  CV Resp: Clear to auscultation bilaterally GI: abdomen is soft, non-tender, non-distended, normal bowel sounds, no hepatosplenomegaly MSK: extremities are warm, no edema.  Skin: warm, no rash Neuro:  no focal deficits Psych: appropriate affect   Diagnostic Studies 05/2015 echo Study Conclusions  - Left ventricle: The cavity size was normal. There was moderate concentric hypertrophy. Systolic function was normal. The estimated ejection fraction was in the range of 60% to 65%. Wall motion was normal; there were no regional wall motion abnormalities. There was an increased relative contribution of atrial contraction to ventricular filling. Doppler parameters are consistent with abnormal left ventricular relaxation (grade 1 diastolic dysfunction). - Mitral valve: There was trivial regurgitation.    Assessment and Plan  1. PAF - no current symptoms. She is rate controlled with lopressor, on eliquis for stroke prevention. There is concern that afib led to her recent MCA stroke, she will require lifelong anticoag.  F/u 6 months      Arnoldo Lenis, M.D.

## 2015-09-09 NOTE — Patient Instructions (Signed)
Medication Instructions:  Your physician recommends that you continue on your current medications as directed. Please refer to the Current Medication list given to you today.  Labwork: None ordered  Testing/Procedures: None ordered  Follow-Up: Your physician wants you to follow-up in: 6 months with Dr. Branch.  You will receive a reminder letter in the mail two months in advance. If you don't receive a letter, please call our office to schedule the follow-up appointment.   Any Other Special Instructions Will Be Listed Below (If Applicable). Thank you for choosing [facility]!!        

## 2015-09-10 ENCOUNTER — Telehealth: Payer: Self-pay

## 2015-09-10 ENCOUNTER — Ambulatory Visit (HOSPITAL_COMMUNITY): Payer: Medicare Other | Admitting: Physical Therapy

## 2015-09-10 ENCOUNTER — Ambulatory Visit (HOSPITAL_COMMUNITY): Payer: Medicare Other | Admitting: Speech Pathology

## 2015-09-10 NOTE — Telephone Encounter (Signed)
metoprolol (LOPRESSOR) 50 MG tablet 25 mg, Daily 1 ordered Reorder  Patient taking differently: 12.5 mg Oral Daily, Indications: take 1/4 twice a day, Reported on 08/31/2015   No response from pt,no vm to cell,home phone no answer. I copied her medication list and it says 1/4 tablet twice a day which would be 12.5 mg BID.The nurse that worked her up is from [place] and I doubt she would remember.I called pharmacy and I called [facility] and they dispense 50 mg with instructions to take 1/2 daily.

## 2015-09-10 NOTE — Telephone Encounter (Signed)
-----   Message from [name], MD sent at 09/10/2015  3:32 PM EDT ----- Patient seen yesterday, noticed on closing her cheart her lopressor is listed as 12.5mg  once daily, this should be bid. Please verify she is taking correctly  Holly FerryJ Branch MD

## 2015-09-11 ENCOUNTER — Telehealth: Payer: Self-pay

## 2015-09-11 MED ORDER — METOPROLOL TARTRATE 25 MG PO TABS: 12.5000 mg | ORAL_TABLET | Freq: Two times a day (BID) | ORAL | Status: DC

## 2015-09-11 NOTE — Telephone Encounter (Signed)
Spoke to Pt husband concerning dose of lopressor. He stated that Dr. Sudie BaileyKnowlton had told him to cut the 50 mg tablet in 1/4 and take it 2 times daily. Spoke to pharmacist and he said that was not a good idea to do because you do not really know that true dosage once you cut the pill in 4 pieces. Called in lopressor 25 mg- in which I instructed the pt husband to give her 1/2 tablet two times daily. He voiced understanding.

## 2015-09-15 ENCOUNTER — Ambulatory Visit (HOSPITAL_COMMUNITY): Payer: Medicare Other | Admitting: Speech Pathology

## 2015-09-15 ENCOUNTER — Ambulatory Visit (HOSPITAL_COMMUNITY): Payer: Medicare Other | Admitting: Physical Therapy

## 2015-09-15 DIAGNOSIS — R29898 Other symptoms and signs involving the musculoskeletal system: Secondary | ICD-10-CM

## 2015-09-15 DIAGNOSIS — I69319 Unspecified symptoms and signs involving cognitive functions following cerebral infarction: Secondary | ICD-10-CM

## 2015-09-15 DIAGNOSIS — R269 Unspecified abnormalities of gait and mobility: Secondary | ICD-10-CM

## 2015-09-15 DIAGNOSIS — I69320 Aphasia following cerebral infarction: Secondary | ICD-10-CM

## 2015-09-15 DIAGNOSIS — R6889 Other general symptoms and signs: Secondary | ICD-10-CM

## 2015-09-15 DIAGNOSIS — R2689 Other abnormalities of gait and mobility: Secondary | ICD-10-CM

## 2015-09-15 NOTE — Therapy (Signed)
[facility] [address] [place], [place], [address] Phone: [phone]   Fax:  [phone]  Speech Language Pathology Treatment  Patient Details  Name: Holly Sparks MRN: [id number] Date of Birth: 1941/06/20 No Data Recorded  Encounter Date: 09/15/2015      End of Session - 09/15/15 1426    Visit Number 8   Number of Visits 16   Date for SLP Re-Evaluation 10/05/15   Authorization Type UHC Medicare   Authorization Time Period 07/07/2015-09/06/2015   Authorization - Visit Number 8   Authorization - Number of Visits 16   SLP Start Time 1342   SLP Stop Time  1430   SLP Time Calculation (min) 48 min   Activity Tolerance Patient tolerated treatment well      Past Medical History  Diagnosis Date  . Hypertension     x 10 yrs  . Diabetes (HCC)     Type 2 x 10 yrs  . High cholesterol   . Presence of permanent cardiac pacemaker   . Paroxysmal a-fib: Remote hx of afib 06/15/2015  . Stroke [facility](HCC)     Past Surgical History  Procedure Laterality Date  . Neck surgery      x 2 for spur and disc problems  . Colonoscopy    . Colonoscopy N/A 05/08/2013    Procedure: COLONOSCOPY;  Surgeon: Malissa HippoNajeeb U Rehman, MD;  Location: [facility];  Service: Endoscopy;  Laterality: N/A;  1015    There were no vitals filed for this visit.  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 No/denies               ADULT SLP TREATMENT - 09/15/15 1356    General Information   Behavior/Cognition Alert;Cooperative;Pleasant mood   Patient Positioning Upright in chair   Oral care provided N/A   HPI Holly BoschCatherine P Sparks is a 74 y.o. female with a history of diabetes mellitus, hypertension and hyperlipidemia who takes aspirin 81 mg on a daily basis. She presented to ER  on 06/13/2015 with expressive and receptive aphasia. CT was negative for actue findings but MRI showed:Acute left MCA territory infarct, most confluent involvement in the left basal ganglia. There is basal ganglia petechial hemorrhage, but no significant mass effect. She did not receive IV t-PAdue to late presentation. MBS completed 7/18 and recommended Dys. 3 textures with nectar-thick liquids. Patient transferred to CIR on 7/19 and remained until discharge home on 06/27/2015. A repeat MBSS was completed 06/23/2015 prior to D/C with ongoing recommendation for D3/NTL. Chin tuck eliminated aspiration, however pt was inconsistent with implementation. She made very good progress in CIR, but continues to present with aphasia and cogntive deficits. Pt referred for outpatient SLP therapy to address cognitive linguistic deficits.   Treatment Provided   Treatment provided Cognitive-Linquistic   Pain Assessment   Pain Assessment No/denies pain   Cognitive-Linquistic Treatment   Treatment focused on Aphasia;Cognition;Patient/family/caregiver education   Skilled Treatment generational naming, orientation, word finding strategies, organization, memory strategies   Assessment / Recommendations / Plan   Plan Continue with current plan of care            SLP Short Term Goals - 09/15/15 1452    SLP SHORT TERM GOAL #1   Title Pt will  complete moderate level divergent naming tasks with 80% acc when provided mild/mod cues.   Baseline 25%   Time 4   Period Weeks   Status On-going   SLP SHORT TERM GOAL #2   Title Pt will produce 5-7 word sentences when describing pictures and given mi/mod cues.   Baseline mod/max cues   Time 4   Period Weeks   Status Achieved   SLP SHORT TERM GOAL #3   Title Pt will be able to name family members and close friends with 100% acc with use of written cues prn.   Baseline 50% mod cues without written assist   Time 4   Period Weeks   Status On-going   SLP SHORT TERM GOAL #4    Title Pt will be oriented to time, place, and situation with 100% acc when cued to use memory book to locate information.   Baseline 25%   Time 4   Period Weeks   Status On-going   SLP SHORT TERM GOAL #5   Title Pt will increase oral reading of numbers and short sentences (5-7 words) to 85% acc with given min cues   Baseline 50% numbers, 70% sentences   Time 4   Period Weeks   Status On-going          SLP Long Term Goals - 09/15/15 1452    SLP LONG TERM GOAL #1   Title Pt will increase cognitive communication skills to [facility] for intermittently supervised environment with use of strategies as needed.    Baseline mod assist in supervised environment   Time 8   Period Weeks   Status On-going          Plan - 09/15/15 1427    Clinical Impression Statement Holly Sparks was seen for ongoing skilled SLP therapy this date. She canceled last Thursday, but was unable to tell me the reason why. She continues to need moderate verbal cues to navigate her notebook to locate important information regarding appointments, etc. She was asked to write personal/bio information, alphabet, #s 1-10, days of week, and months of the year without written cues. SLP provided min verbal cues for error awareness and to correct errors, otherwise pt with 100% legibility. When creating a short grocery list while adhering to specific categories, she was able to supply appropriate items with 93% acc when provided mi/mod verbal cues for visualization and association strategies. Continue POC.    Speech Therapy Frequency 2x / week   Duration --  3 weeks   Treatment/Interventions SLP instruction and feedback;Cueing hierarchy;Cognitive reorganization;Compensatory strategies;Patient/family education;Functional tasks;Multimodal communcation approach;Compensatory techniques   Potential to Achieve Goals Good   Potential Considerations Other (comment)  Pt somewhat anxious   SLP Home Exercise Plan Pt will complete HEP as  assigned to faciliate carryover of treatment strategies and techniques in home/community environment   Consulted and Agree with Plan of Care Patient;Family member/caregiver   Family Member Consulted husband        Problem List Patient Active Problem List   Diagnosis Date Noted  . Paroxysmal atrial fibrillation (HCC) 08/31/2015  . Chronic anticoagulation 08/31/2015  . Type 2 diabetes mellitus with circulatory disorder (HCC) 08/31/2015  . 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 Paroxysmal a-fib: Remote hx of afib 06/15/2015  . Cerebral infarction due to embolism of left middle cerebral artery (HCC)   . Essential hypertension   . Stroke-like symptoms 06/13/2015  . HLD (hyperlipidemia) 06/13/2015  . DDD (degenerative disc disease), cervical 06/13/2015  .  Elevated serum creatinine 06/13/2015  . Aphasia 06/13/2015  . Expressive aphasia   . Cerebral infarction due to unspecified mechanism   . Type 2 diabetes mellitus without complication (HCC)   . IBS (irritable bowel syndrome) 04/24/2013  . High cholesterol 04/24/2013  . Diabetes (HCC) 04/24/2013  . Essential hypertension, benign 04/24/2013   Thank you,  Havery Moros, CCC-SLP [phone]  [facility] 09/15/2015, 2:54 PM  [facility] [address] [place], [place], [address] Phone: [phone]   Fax:  [phone]   Name: Holly Sparks MRN: [id number] Date of Birth: 10-27-1941

## 2015-09-15 NOTE — Therapy (Signed)
[facility] [address] [place], [place], [address] Phone: [phone]   Fax:  [phone]  Physical Therapy Treatment  Patient Details  Name: Holly BoschCatherine P Sparks MRN: [id number] Date of Birth: January 04, 1941 No Data Recorded  Encounter Date: 09/15/2015      PT End of Session - 09/15/15 1433    Visit Number 14   Number of Visits 18   Date for PT Re-Evaluation 09/23/15   Authorization Type UHC medicare   Authorization Time Period Gcodes done 6th visit   Authorization - Visit Number 14   Authorization - Number of Visits 16   PT Start Time 1435   PT Stop Time 1515   PT Time Calculation (min) 40 min   Equipment Utilized During Treatment Gait belt   Activity Tolerance Patient tolerated treatment well      Past Medical History  Diagnosis Date  . Hypertension     x 10 yrs  . Diabetes (HCC)     Type 2 x 10 yrs  . High cholesterol   . Presence of permanent cardiac pacemaker   . Paroxysmal a-fib: Remote hx of afib 06/15/2015  . Stroke [place](HCC)     Past Surgical History  Procedure Laterality Date  . Neck surgery      x 2 for spur and disc problems  . Colonoscopy    . Colonoscopy N/A 05/08/2013    Procedure: COLONOSCOPY;  Surgeon: Malissa HippoNajeeb U Rehman, MD;  [facility];  Service: Endoscopy;  Laterality: N/A;  1015    There were no vitals filed for this visit.  Visit Diagnosis:  Abnormality of gait  Weakness of both legs  Poor balance  Decreased functional activity tolerance           Balance Exercises - 09/15/15 1449    Balance Exercises: Standing   Standing Eyes Opened --  toe tap on 6" step with opposite arm swing x10@   Tandem Stance Eyes open;3 reps  no UE support    Tandem Gait Forward;Retro;2 reps   Sidestepping 2 reps  with t-band    Step Over Hurdles / Cones 6in alternating 2RT   Sit to Stand Time 10   Other Standing Exercises music therapy to have armswing with opposite hip rotation; side steping to  Rt then LT, forward and back in quick session.              PT Short Term Goals - 08/12/15 1046    PT SHORT TERM GOAL #1   Title I HEP   Time 2   Period Weeks   Status On-going   PT SHORT TERM GOAL #2   Title Pt to be able to complete 6 Sit to stand in 30 seconds to demonstate improved power for easier sit to stand    Time 2   Period Weeks   Status On-going   PT SHORT TERM GOAL #3   Title Pt to be ambulating 20 minutes at a time for improved health habits.   Time 2   Period Weeks   Status Achieved   PT SHORT TERM GOAL #4   Title Pt TUG to be  17 or less to reduce risk of falls    Time 3   Period Weeks   Status On-going           PT Long Term Goals - 08/12/15 1047    PT LONG TERM GOAL #1   Title I in advance HEP   Time  6   Period Weeks   Status On-going   PT LONG TERM GOAL #2   Title Pt to show no unsteadiness when ambulating without assistive device including turning head and turns   Time 6   Period Weeks   Status On-going   PT LONG TERM GOAL #3   Title Pt to be walking for 30 minutes a day for better health habitis   Time 6   Period Weeks   Status On-going   PT LONG TERM GOAL #4   Title Pt strength to be improved one grade to allow pt to squat to ground and retrun without difficutly   Time 6   Period Weeks   Status On-going               Plan - 09/15/15 1516    Clinical Impression Statement Treament focused on balance.  Pt continues to let center of gravity go behind her base of support pt is able to recover 40% of the time needing moderate assist for 30% and min to verbal cuing 30%.  Pt ambulates with significant decreased arm swing and slow candance therefore added steppage with music and arm swing to facilitate proper speed and rotation during gt activites.    Pt will benefit from skilled therapeutic intervention in order to improve on the following deficits Abnormal gait;Decreased activity tolerance;Decreased balance;Decreased  cognition;Decreased safety awareness;Decreased strength;Difficulty walking   PT Next Visit Plan focus on balance, increasing trunk rotation and arm swing during ambulation.  Pt may benefit from Big techniques.        Problem List Patient Active Problem List   Diagnosis Date Noted  . Paroxysmal atrial fibrillation (HCC) 08/31/2015  . Chronic anticoagulation 08/31/2015  . Type 2 diabetes mellitus with circulatory disorder (HCC) 08/31/2015  . 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 Paroxysmal a-fib: Remote hx of afib 06/15/2015  . Cerebral infarction due to embolism of left middle cerebral artery (HCC)   . Essential hypertension   . Stroke-like symptoms 06/13/2015  . HLD (hyperlipidemia) 06/13/2015  . DDD (degenerative disc disease), cervical 06/13/2015  . Elevated serum creatinine 06/13/2015  . Aphasia 06/13/2015  . Expressive aphasia   . Cerebral infarction due to unspecified mechanism   . Type 2 diabetes mellitus without complication (HCC)   . IBS (irritable bowel syndrome) 04/24/2013  . High cholesterol 04/24/2013  . Diabetes (HCC) 04/24/2013  . Essential hypertension, benign 04/24/2013    Virgina Organ, PT CLT [phone] 09/15/2015, 3:22 PM  [facility] [address] [place], [place], [address] Phone: [phone]   Fax:  [phone]  Name: Holly Sparks MRN: [id number] Date of Birth: February 11, 1941

## 2015-09-22 ENCOUNTER — Ambulatory Visit (HOSPITAL_COMMUNITY): Payer: Medicare Other | Admitting: Physical Therapy

## 2015-09-22 ENCOUNTER — Ambulatory Visit (HOSPITAL_COMMUNITY): Payer: Medicare Other | Admitting: Speech Pathology

## 2015-09-22 DIAGNOSIS — I69319 Unspecified symptoms and signs involving cognitive functions following cerebral infarction: Secondary | ICD-10-CM | POA: Diagnosis not present

## 2015-09-22 DIAGNOSIS — R29898 Other symptoms and signs involving the musculoskeletal system: Secondary | ICD-10-CM

## 2015-09-22 DIAGNOSIS — R269 Unspecified abnormalities of gait and mobility: Secondary | ICD-10-CM

## 2015-09-22 DIAGNOSIS — R6889 Other general symptoms and signs: Secondary | ICD-10-CM

## 2015-09-22 DIAGNOSIS — R2689 Other abnormalities of gait and mobility: Secondary | ICD-10-CM

## 2015-09-22 DIAGNOSIS — I69320 Aphasia following cerebral infarction: Secondary | ICD-10-CM

## 2015-09-22 NOTE — Therapy (Signed)
[place] [address] [place], [place], [address] Phone: [phone]   Fax:  [phone]  Physical Therapy Treatment  Patient Details  Name: Holly Sparks MRN: [id number] Date of Birth: 09-18-1941 No Data Recorded  Encounter Date: 09/22/2015      PT End of Session - 09/22/15 1608    Visit Number 15   Number of Visits 18   Authorization Type UHC medicare   PT Start Time 3335   PT Stop Time 1553   PT Time Calculation (min) 38 min   Equipment Utilized During Treatment Gait belt   Activity Tolerance Patient tolerated treatment well   Behavior During Therapy [facility] for tasks assessed/performed      Past Medical History  Diagnosis Date  . Hypertension     x 10 yrs  . Diabetes ([place])     Type 2 x 10 yrs  . High cholesterol   . Presence of permanent cardiac pacemaker   . Paroxysmal a-fib: Remote hx of afib 06/15/2015  . Stroke [facility])     Past Surgical History  Procedure Laterality Date  . Neck surgery      x 2 for spur and disc problems  . Colonoscopy    . Colonoscopy N/A 05/08/2013    Procedure: COLONOSCOPY;  Surgeon: Rogene Houston, MD;  [facility];  Service: Endoscopy;  Laterality: N/A;  1015    There were no vitals filed for this visit.  Visit Diagnosis:  Abnormality of gait  Weakness of both legs  Poor balance  Decreased functional activity tolerance      Subjective Assessment - 09/22/15 1521    Subjective Pt reports that she improved overall since beginning PT. She states that her walking is better, her balance has improved, and she feels stronger than when she first began.    How long can you sit comfortably? no problem    How long can you stand comfortably? 20-25   How long can you walk comfortably? 30 minutes   Currently in Pain? No/denies   Pain Score 0-No pain            [facility] PT Assessment - 09/22/15 0001    Observation/Other Assessments   Focus on Therapeutic Outcomes (FOTO)  60    Single Leg Stance   Comments RLE 9", LLE 12"   Sit to Stand   Comments 6 in 30"   Strength   Right Hip Flexion 4/5   Right Hip Extension 3/5   Right Hip ABduction 4/5   Left Hip Flexion 4/5   Left Hip Extension 3/5   Left Hip ABduction 4/5   Right Knee Extension 4+/5   Left Knee Extension 4+/5   Right Ankle Dorsiflexion 4/5   Left Ankle Dorsiflexion 5/5   Timed Up and Go Test   Normal TUG (seconds) 13.03                     [facility] Adult PT Treatment/Exercise - 09/22/15 0001    Ambulation/Gait   Ambulation/Gait Yes   Ambulation/Gait Assistance 5: Supervision   Ambulation Distance (Feet) 450 Feet   Gait Comments Increased VC for arm swing with increased fatigue   Lumbar Exercises: Seated   Sit to Stand 10 reps                PT Education - 09/22/15 1612    Education provided Yes   Education Details Educated on importance of HEP, goals reviewed  PT Short Term Goals - 2015-10-22 1543    PT SHORT TERM GOAL #1   Title I HEP   Time 2   Period Weeks   Status Not Met   PT SHORT TERM GOAL #2   Title Pt to be able to complete 6 Sit to stand in 30 seconds to demonstate improved power for easier sit to stand    Time 2   Period Weeks   Status Achieved   PT SHORT TERM GOAL #3   Title Pt to be ambulating 20 minutes at a time for improved health habits.   Time 2   Period Weeks   Status Achieved   PT SHORT TERM GOAL #4   Status Achieved           PT Long Term Goals - 10-22-2015 1544    PT LONG TERM GOAL #1   Title I in advance HEP   Time 6   Period Weeks   Status Not Met   PT LONG TERM GOAL #2   Title Pt to show no unsteadiness when ambulating without assistive device including turning head and turns   Time 6   Period Weeks   Status Partially Met   PT LONG TERM GOAL #3   Title Pt to be walking for 30 minutes a day for better health habitis   Time 6   Period Weeks   Status Partially Met   PT LONG TERM GOAL #4   Title Pt strength  to be improved one grade to allow pt to squat to ground and retrun without difficutly   Time 6   Period Weeks   Status Partially Met               Plan - 10/22/15 1610    Clinical Impression Statement Reassessment completed today. Pt had made improvements towards LTGs in regards to strength and functional mobility, however, she has been noncompliant with her HEP, and continues to demonstrate decreased arm swing during gait. Pt has partially met her LTGs. She demonstrates decresed fall risk per TUG and improved functional strength evidenced by 30 second chair rise. Pt is pleased with her current functional level, stating that her only limitation at home is that she fatigues when she walks too far. Pt has been educated on importance of HEP, and is being d/c to continue independently.    Pt will benefit from skilled therapeutic intervention in order to improve on the following deficits Abnormal gait;Decreased activity tolerance;Decreased balance;Decreased cognition;Decreased safety awareness;Decreased strength;Difficulty walking   PT Treatment/Interventions ADLs/Self Care Home Management;Gait training;Stair training;Functional mobility training;Therapeutic activities;Therapeutic exercise;Balance training;Patient/family education          G-Codes - 10/22/15 1613    Functional Assessment Tool Used foto    Mobility: Walking and Moving Around Current Status [phone]) At least 20 percent but less than 40 percent impaired, limited or restricted   Mobility: Walking and Moving Around Goal Status [phone]) At least 20 percent but less than 40 percent impaired, limited or restricted   Mobility: Walking and Moving Around Discharge Status [phone]) At least 20 percent but less than 40 percent impaired, limited or restricted      Problem List Patient Active Problem List   Diagnosis Date Noted  . Paroxysmal atrial fibrillation ([place]) 08/31/2015  . Chronic anticoagulation 08/31/2015  . Type 2 diabetes  mellitus with circulatory disorder ([place]) 08/31/2015  . Left middle cerebral artery stroke ([place]) 06/16/2015  . Paroxysmal a-fib: Remote hx of afib 06/15/2015  . Cerebral  infarction due to embolism of left middle cerebral artery ([place])   . Essential hypertension   . Stroke-like symptoms 06/13/2015  . HLD (hyperlipidemia) 06/13/2015  . DDD (degenerative disc disease), cervical 06/13/2015  . Elevated serum creatinine 06/13/2015  . Aphasia 06/13/2015  . Expressive aphasia   . Cerebral infarction due to unspecified mechanism   . Type 2 diabetes mellitus without complication ([place])   . IBS (irritable bowel syndrome) 04/24/2013  . High cholesterol 04/24/2013  . Diabetes ([place]) 04/24/2013  . Essential hypertension, benign 04/24/2013    PHYSICAL THERAPY DISCHARGE SUMMARY  Visits from Start of Care: 15  Current functional level related to goals / functional outcomes: Pt demonstrates improved BLE strength, decreased fall risk, improved gait speed.    Remaining deficits: Pt continues to demonstrate decreased functional activity tolerance.    Education / Equipment: HEP  Plan: Patient agrees to discharge.  Patient goals were partially met. Patient is being discharged due to being pleased with the current functional level.  ?????       Hilma Favors, PT, DPT [phone] 09/22/2015, 4:14 PM  [facility] [address] [place], [place], [address] Phone: [phone]   Fax:  [phone]  Name: Holly Sparks MRN: [id number] Date of Birth: [birth year]

## 2015-09-22 NOTE — Therapy (Addendum)
[place], [place], [address] Phone: [phone]   Fax:  [phone]  Speech Language Pathology Treatment  Patient Details  Name: Holly Sparks MRN: [id number] Date of Birth: [birth year] No Data Recorded  Encounter Date: 09/22/2015      End of Session - 09/22/15 1514    Visit Number 9   Number of Visits 16   Date for SLP Re-Evaluation 10/05/15   Authorization Type UHC Medicare   Authorization Time Period 10th visit   Authorization - Visit Number 9   Authorization - Number of Visits 16   SLP Start Time 1430   SLP Stop Time  1515   SLP Time Calculation (min) 45 min   Activity Tolerance Patient tolerated treatment well      Past Medical History  Diagnosis Date  . Hypertension     x 10 yrs  . Diabetes ([place])     Type 2 x 10 yrs  . High cholesterol   . Presence of permanent cardiac pacemaker   . Paroxysmal a-fib: Remote hx of afib 06/15/2015  . Stroke [facility])     Past Surgical History  Procedure Laterality Date  . Neck surgery      x 2 for spur and disc problems  . Colonoscopy    . Colonoscopy N/A 05/08/2013    Procedure: COLONOSCOPY;  Surgeon: Rogene Houston, MD;  [facility];  Service: Endoscopy;  Laterality: N/A;  1015    There were no vitals filed for this visit.  Visit Diagnosis: Aphasia following cerebral infarction  Cognitive deficit due to recent stroke      Subjective Assessment - 09/22/15 1434    Subjective "I passed out last night and hit my head on the toilet."   Currently in Pain? No/denies               ADULT SLP TREATMENT - 09/22/15 1436    General Information   Behavior/Cognition Alert;Cooperative;Pleasant mood   Patient Positioning Upright in chair   Oral care provided N/A   HPI Holly Sparks is a 74 y.o. female with a history of diabetes mellitus, hypertension and hyperlipidemia who takes aspirin 81 mg on a daily basis. She presented to ER on  06/13/2015 with expressive and receptive aphasia. CT was negative for actue findings but MRI showed:Acute left MCA territory infarct, most confluent involvement in the left basal ganglia. There is basal ganglia petechial hemorrhage, but no significant mass effect. She did not receive IV t-PAdue to late presentation. MBS completed 7/18 and recommended Dys. 3 textures with nectar-thick liquids. Patient transferred to CIR on 7/19 and remained until discharge home on 06/27/2015. A repeat MBSS was completed 06/23/2015 prior to D/C with ongoing recommendation for D3/NTL. Chin tuck eliminated aspiration, however pt was inconsistent with implementation. She made very good progress in CIR, but continues to present with aphasia and cogntive deficits. Pt referred for outpatient SLP therapy to address cognitive linguistic deficits.   Treatment Provided   Treatment provided Cognitive-Linquistic   Pain Assessment   Pain Assessment No/denies pain   Cognitive-Linquistic Treatment   Treatment focused on Aphasia;Cognition;Patient/family/caregiver education   Skilled Treatment generational naming, orientation, word finding strategies, organization, memory strategies   Assessment / Recommendations / [place] with current plan of care            SLP Short Term Goals - 09/22/15 1813    SLP SHORT TERM GOAL #1   Title  Pt will complete moderate level divergent naming tasks with 80% acc when provided mild/mod cues.   Baseline 25%   Time 4   Period Weeks   Status On-going   SLP SHORT TERM GOAL #2   Title Pt will produce 5-7 word sentences when describing pictures and given mi/mod cues.   Baseline mod/max cues   Time 4   Period Weeks   Status Achieved   SLP SHORT TERM GOAL #3   Title Pt will be able to name family members and close friends with 100% acc with use of written cues prn.   Baseline 50% mod cues without written assist   Time 4   Period Weeks   Status On-going   SLP SHORT TERM GOAL #4    Title Pt will be oriented to time, place, and situation with 100% acc when cued to use memory book to locate information.   Baseline 25%   Time 4   Period Weeks   Status On-going   SLP SHORT TERM GOAL #5   Title Pt will increase oral reading of numbers and short sentences (5-7 words) to 85% acc with given min cues   Baseline 50% numbers, 70% sentences   Time 4   Period Weeks   Status On-going          SLP Long Term Goals - 09/22/15 1813    SLP LONG TERM GOAL #1   Title Pt will increase cognitive communication skills to [facility] for intermittently supervised environment with use of strategies as needed.    Baseline mod assist in supervised environment   Time 8   Period Weeks   Status On-going          Plan - 09/22/15 1813    Clinical Impression Statement Holly Sparks was seen for ongoing skilled SLP therapy this date. She required moderate verbal and visual cues to update her calendar. SLP reiterated importance and reasons for doing so at home. Pt reports that she "passed out" last night while seated on the toilet, but doing "fine" now. Her husband was able to assist her. She is hopeful that she will graduate from PT/SLP soon and I discussed that I would like to see her for at least one more session for completion of education regarding HEP. Pt in agreement. She was unable to name close friends, however SLP found a photo with church friends on facebook and pt was then able to name 6 friends with min assist. Continue for one more session in preparation for discharge.   Speech Therapy Frequency 2x / week   Duration --  3 weeks   Treatment/Interventions SLP instruction and feedback;Cueing hierarchy;Cognitive reorganization;Compensatory strategies;Patient/family education;Functional tasks;Multimodal communcation approach;Compensatory techniques   Potential to Achieve Goals Good   Potential Considerations Other (comment)  Pt somewhat anxious   SLP Home Exercise Plan Pt will complete HEP as  assigned to faciliate carryover of treatment strategies and techniques in home/community environment   Consulted and Agree with Plan of Care Patient;Family member/caregiver   Family Member Consulted husband        Problem List Patient Active Problem List   Diagnosis Date Noted  . Paroxysmal atrial fibrillation ([place]) 08/31/2015  . Chronic anticoagulation 08/31/2015  . Type 2 diabetes mellitus with circulatory disorder ([place]) 08/31/2015  . Left middle cerebral artery stroke ([place]) 06/16/2015  . Paroxysmal a-fib: Remote hx of afib 06/15/2015  . Cerebral infarction due to embolism of left middle cerebral artery ([place])   . Essential hypertension   .  Stroke-like symptoms 06/13/2015  . HLD (hyperlipidemia) 06/13/2015  . DDD (degenerative disc disease), cervical 06/13/2015  . Elevated serum creatinine 06/13/2015  . Aphasia 06/13/2015  . Expressive aphasia   . Cerebral infarction due to unspecified mechanism   . Type 2 diabetes mellitus without complication ([place])   . IBS (irritable bowel syndrome) 04/24/2013  . High cholesterol 04/24/2013  . Diabetes ([place]) 04/24/2013  . Essential hypertension, benign 04/24/2013   SPEECH THERAPY DISCHARGE SUMMARY  Visits from Start of Care: 9  Current functional level related to goals / functional outcomes: Good progress toward goals. See above and below   Remaining deficits: Mild expressive language deficits; mild memory impairment   Education / Equipment: HEP assigned Plan: Patient agrees to discharge.  Patient goals were partially met. Patient is being discharged due to not returning since the last visit.  ?????         G Code: Language expression; clinical judgment; Goal: CI and Discharge status: CI  Thank you,  Genene Churn, [place]  [facility] 09/22/2015, [place] [address] [place], [place], [address] Phone: [phone]   Fax:  [phone]   Name:  Holly Sparks MRN: [id number] Date of Birth: 1941-07-14

## 2015-09-24 ENCOUNTER — Ambulatory Visit (HOSPITAL_COMMUNITY): Payer: Medicare Other | Admitting: Physical Therapy

## 2015-09-29 ENCOUNTER — Ambulatory Visit (HOSPITAL_COMMUNITY): Payer: Medicare Other | Attending: Physical Medicine & Rehabilitation | Admitting: Speech Pathology

## 2015-09-29 ENCOUNTER — Ambulatory Visit (HOSPITAL_COMMUNITY): Payer: Medicare Other | Admitting: Physical Therapy

## 2015-10-01 ENCOUNTER — Ambulatory Visit (HOSPITAL_COMMUNITY): Payer: Medicare Other | Admitting: Physical Therapy

## 2015-10-01 ENCOUNTER — Encounter (HOSPITAL_COMMUNITY): Payer: Medicare Other | Admitting: Speech Pathology

## 2015-11-13 ENCOUNTER — Ambulatory Visit: Payer: Medicare Other | Admitting: Nutrition

## 2015-12-02 ENCOUNTER — Ambulatory Visit: Payer: Medicare Other | Admitting: Nutrition

## 2015-12-08 ENCOUNTER — Ambulatory Visit: Payer: Medicare Other | Admitting: Neurology

## 2015-12-09 ENCOUNTER — Encounter: Payer: Self-pay | Admitting: Neurology

## 2015-12-22 ENCOUNTER — Encounter: Payer: Self-pay | Admitting: Nurse Practitioner

## 2015-12-22 ENCOUNTER — Ambulatory Visit (INDEPENDENT_AMBULATORY_CARE_PROVIDER_SITE_OTHER): Payer: Medicare Other | Admitting: Nurse Practitioner

## 2015-12-22 VITALS — BP 154/86 | HR 80 | Ht 63.0 in | Wt 138.2 lb

## 2015-12-22 DIAGNOSIS — I480 Paroxysmal atrial fibrillation: Secondary | ICD-10-CM

## 2015-12-22 DIAGNOSIS — E785 Hyperlipidemia, unspecified: Secondary | ICD-10-CM | POA: Diagnosis not present

## 2015-12-22 DIAGNOSIS — I10 Essential (primary) hypertension: Secondary | ICD-10-CM

## 2015-12-22 DIAGNOSIS — E7800 Pure hypercholesterolemia, unspecified: Secondary | ICD-10-CM | POA: Diagnosis not present

## 2015-12-22 DIAGNOSIS — I63512 Cerebral infarction due to unspecified occlusion or stenosis of left middle cerebral artery: Secondary | ICD-10-CM

## 2015-12-22 NOTE — Progress Notes (Signed)
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 Madie Sparks. Holly Sparks is a 75 y.o. female with history of DM, HTN, HLD and remote atrial fibrillation not on anticoagulation was admitted on 06/13/15 for expressive and receptive aphasia. 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 MRA, carotid Doppler, 2-D echo unremarkable. LDL 70, and A1c 11.8. Stroke etiology likely due to A. fib not on anticoagulation. She was put on eliquis and discharged to CIR.  Interval History10/3/16 Dr. Erlinda Sparks During the interval time, the patient has been doing well. Language much improved. Continued to have speech therapy twice a week. She still has some word finding difficulties intermittently but able to understand. Has cane for walking but prefer not to use it. BP 108/67 today in clinic.  UPDATE 12/22/2015 Ms Holly Sparks, 74 year old female returns for follow-up. She presented 06/13/2015 for expressive and receptive aphasia. MRI confirmed left MCA territory infarct. When last seen she was continuing to get speech therapy but that has stopped. She has rare word finding difficulties now. She denies any falls and she does not use an assistive device. She checks her CBGs at home weekly last was 130. She remains on Eloquis for secondary stroke prevention and atrial fibrillation.Cardiologist is Dr. Harl Sparks.  She returns for follow-up  REVIEW OF SYSTEMS: Full 14 system review of systems performed and notable only for those listed, all others are neg:  Constitutional: neg  Cardiovascular: neg Ear/Nose/Throat: neg  Skin: neg Eyes: Blurred vision  Respiratory: neg Gastroitestinal: neg  Hematology/Lymphatic: neg    Endocrine: neg Musculoskeletal:neg Allergy/Immunology: neg Neurological: Memory loss Psychiatric: Anxiety Sleep : neg   ALLERGIES: Allergies  Allergen Reactions  . Prednisone Nausea And Vomiting    HOME MEDICATIONS: Outpatient Prescriptions Prior to Visit  Medication Sig Dispense Refill  . ACCU-CHEK SOFTCLIX LANCETS lancets     . apixaban (ELIQUIS) 5 MG TABS tablet Take 1 tablet (5 mg total) by mouth 2 (two) times daily. 60 tablet 1  . atorvastatin (LIPITOR) 20 MG tablet Take 1 tablet (20 mg total) by mouth at bedtime. 30 tablet 1  . Blood Glucose Monitoring Suppl (ACCU-CHEK AVIVA PLUS) W/DEVICE KIT     . FLUoxetine (PROZAC) 10 MG capsule     . insulin glargine (LANTUS) 100 UNIT/ML injection Inject 0.2 mLs (20 Units total) into the skin at bedtime. (Patient taking differently: Inject 14 Units into the skin at bedtime. ) 10 mL 11  . lisinopril-hydrochlorothiazide (PRINZIDE,ZESTORETIC) 20-25 MG per tablet Take 1 tablet by mouth daily. For high blood pressure (Patient taking differently: Take by mouth daily. For high blood pressure  1/12) 30 tablet 1  . LORazepam (ATIVAN) 0.5 MG tablet Take 1 tablet (0.5 mg total) by mouth 4 (four) times daily as needed for anxiety. 30 tablet 0  . metFORMIN (GLUCOPHAGE) 1000 MG tablet Take 500 mg by mouth 2 (two) times daily with a meal.    . metoprolol tartrate (LOPRESSOR) 25 MG tablet Take 0.5 tablets (12.5 mg total) by mouth 2 (two) times daily. 180 tablet 3  . traMADol (ULTRAM) 50 MG tablet Take 1 tablet (50 mg total) by mouth every 6 (six) hours as needed. 60 tablet 0   No facility-administered medications prior to visit.    PAST MEDICAL HISTORY:  Past Medical History  Diagnosis Date  . Hypertension     x 10 yrs  . 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 High cholesterol   . Presence of permanent cardiac pacemaker   . Paroxysmal a-fib: Remote hx of afib 06/15/2015  . Stroke [facility])     PAST SURGICAL HISTORY: Past Surgical History   Procedure Laterality Date  . Neck surgery      x 2 for spur and disc problems  . Colonoscopy    . Colonoscopy N/A 05/08/2013    Procedure: COLONOSCOPY;  Surgeon: Rogene Houston, MD;  Location[facility];  Service: Endoscopy;  Laterality: N/A;  1015    FAMILY HISTORY: No family history on file.  SOCIAL HISTORY: Social History   Social History  . Marital Status: Married    Spouse Name: N/A  . Number of Children: N/A  . Years of Education: N/A   Occupational History  . Not on file.   Social History Main Topics  . Smoking status: Never Smoker   . Smokeless tobacco: Not on file  . Alcohol Use: No  . Drug Use: No  . Sexual Activity: Not on file   Other Topics Concern  . Not on file   Social History Narrative     PHYSICAL EXAM  Filed Vitals:   12/22/15 0954  BP: 154/86  Pulse: 80  Height: '5\' 3"'  (1.6 m)  Weight: 138 lb 3.2 oz (62.687 kg)   Body mass index is 24.49 kg/(m^2).  Generalized: Well developed, in no acute distress  Head: normocephalic and atraumatic,. Oropharynx benign  Neck: Supple, no carotid bruits  Cardiac: Regular rate rhythm, no murmur  Musculoskeletal: No deformity   Neurological examination   Mentation: Alert oriented to time, place, history taking. Attention span and concentration appropriate.   Follows all commands speech with mild hesitancy, follows three-step commands   Cranial nerve II-XII: Fundoscopic exam reveals sharp disc margins.Pupils were equal round reactive to light extraocular movements were full, visual field were full on confrontational test. Facial sensation and strength were normal. hearing was intact to finger rubbing bilaterally. Uvula tongue midline. head turning and shoulder shrug were normal and symmetric.Tongue protrusion into cheek strength was normal. Motor: normal bulk and tone, full strength in the BUE, BLE, fine finger movements normal, no pronator drift. No focal weakness Sensory: normal and symmetric to light  touch, pinprick, and  Vibration,  Coordination: finger-nose-finger, heel-to-shin bilaterally, no dysmetria Reflexes: 1+ upper lower and symmetric, plantar responses were flexor bilaterally. Gait and Station: Rising up from seated position without assistance, slow cautious gait, no difficulty with turns no assistive device  DIAGNOSTIC DATA (LABS, IMAGING, TESTING) - I reviewed patient records, labs, notes, testing and imaging myself where available.  Lab Results  Component Value Date   WBC 8.4 06/24/2015   HGB 14.8 06/24/2015   HCT 43.4 06/24/2015   MCV 86.5 06/24/2015   PLT 202 06/24/2015      Component Value Date/Time   NA 136 06/24/2015 0925   K 3.8 06/24/2015 0925   CL 98* 06/24/2015 0925   CO2 28 06/24/2015 0925   GLUCOSE 189* 06/24/2015 0925   BUN 28* 06/24/2015 0925   CREATININE 1.49* 06/24/2015 0925   CALCIUM 9.5 06/24/2015 0925   PROT 6.2* 06/17/2015 0515   ALBUMIN 3.2* 06/17/2015 0515   AST 26 06/17/2015 0515   ALT 15 06/17/2015 0515   ALKPHOS 68 06/17/2015 0515   BILITOT 1.0 06/17/2015 0515  GFRNONAA 34* 06/24/2015 0925   GFRAA 39* 06/24/2015 0925   Lab Results  Component Value Date   CHOL 135 06/14/2015   HDL 28* 06/14/2015   LDLCALC 70 06/14/2015   TRIG 185* 06/14/2015   CHOLHDL 4.8 06/14/2015   Lab Results  Component Value Date   HGBA1C 11.8* 06/14/2015    ASSESSMENT AND PLAN  75 y.o. year old female with PMH of DM, HTN, HLD and remote atrial fibrillation not on anticoagulation was admitted on 06/13/15 for expressive and receptive aphasia. MRI showed acute left MCA territory infarct. Other stroke work up negative. LDL 70, and A1c 11.8. Stroke etiology likely due to A. fib not on anticoagulation. She was put on eliquis . Her language skills much improved on follow-up and speech therapy has concluded.  PLAN:  Continue eliquis and lipitor for stroke prevention Follow up with cardiology for afib Dr. Harl Sparks Follow up with your primary care physician for  stroke risk factor modification. Recommend maintain blood pressure goal <130/80, today's reading 154/86 Diabetes with hemoglobin A1c goal below 6.5%  Lipids with LDL cholesterol goal below 70 mg/dL.  Check BP and glucose at home and keep a log for PCP Follow up in 6 months if stable will discharge Vst time 25 min Dennie Bible, [facility], [facility], [facility] [address], [place], [place] [address] [phone]

## 2015-12-22 NOTE — Progress Notes (Signed)
I reviewed above note and agree with the assessment and plan.  Marvel Plan, MD PhD Stroke Neurology 12/22/2015 6:00 PM

## 2015-12-22 NOTE — Patient Instructions (Signed)
Continue eliquis and lipitor for stroke prevention Follow up with cardiology for afib Dr. Wyline Mood Follow up with your primary care physician for stroke risk factor modification. Recommend maintain blood pressure goal <130/80,  Diabetes with hemoglobin A1c goal below 6.5%  Lipids with LDL cholesterol goal below 70 mg/dL.  Check BP and glucose at home and keep a log for PCP Follow up in 6 months

## 2016-02-17 ENCOUNTER — Ambulatory Visit: Payer: Medicare Other | Admitting: Neurology

## 2016-02-22 ENCOUNTER — Encounter (HOSPITAL_COMMUNITY): Payer: Self-pay

## 2016-03-10 ENCOUNTER — Encounter: Payer: Self-pay | Admitting: Cardiology

## 2016-03-10 ENCOUNTER — Ambulatory Visit (INDEPENDENT_AMBULATORY_CARE_PROVIDER_SITE_OTHER): Payer: Medicare Other | Admitting: Cardiology

## 2016-03-10 VITALS — BP 184/88 | HR 51 | Ht 65.0 in | Wt 139.0 lb

## 2016-03-10 DIAGNOSIS — E785 Hyperlipidemia, unspecified: Secondary | ICD-10-CM

## 2016-03-10 DIAGNOSIS — I480 Paroxysmal atrial fibrillation: Secondary | ICD-10-CM

## 2016-03-10 DIAGNOSIS — I10 Essential (primary) hypertension: Secondary | ICD-10-CM

## 2016-03-10 NOTE — Progress Notes (Signed)
Patient ID: Holly Sparks, female   DOB: [birth year], 75 y.o.   MRN: [id number]     Clinical Summary Ms. Folk is a 75 y.o.female seen today as a new patient for the following medical problems  1. Afib - remote history of afib in 2004. Recent MCA stroke 05/2015 thought to be related. Afib was noted on EKG during that admission - has been on rate control and anticoagulation.    - denies any palpitations. Denies any lightheadness or dizziness - compliant with meds, no bleeding issues on eliquis.    2. Hx of CVA - 05/2015 found to have acute left MCA stroke  3. Hyperlipidemia - 07/2015 TC 11 HDL 31 TG 143 LDL 51 - compliant with statin  4. HTN - compliant with meds - does not check bp at home.  Past Medical History  Diagnosis Date  . Hypertension     x 10 yrs  . 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 High cholesterol   . Presence of permanent cardiac pacemaker   . Paroxysmal a-fib: Remote hx of afib 06/15/2015  . Stroke [facility])      Allergies  Allergen Reactions  . Prednisone Nausea And Vomiting     Current Outpatient Prescriptions  Medication Sig Dispense Refill  . ACCU-CHEK SOFTCLIX LANCETS lancets     . apixaban (ELIQUIS) 5 MG TABS tablet Take 1 tablet (5 mg total) by mouth 2 (two) times daily. 60 tablet 1  . atorvastatin (LIPITOR) 20 MG tablet Take 1 tablet (20 mg total) by mouth at bedtime. 30 tablet 1  . Blood Glucose Monitoring Suppl (ACCU-CHEK AVIVA PLUS) W/DEVICE KIT     . FLUoxetine (PROZAC) 10 MG capsule     . insulin glargine (LANTUS) 100 UNIT/ML injection Inject 0.2 mLs (20 Units total) into the skin at bedtime. (Patient taking differently: Inject 14 Units into the skin at bedtime. ) 10 mL 11  . lisinopril-hydrochlorothiazide (PRINZIDE,ZESTORETIC) 20-25 MG per tablet Take 1 tablet by mouth daily. For high blood pressure (Patient taking differently: Take by mouth daily. For high blood pressure  1/2 tabl daily.) 30 tablet 1  . LORazepam (ATIVAN) 0.5 MG tablet  Take 1 tablet (0.5 mg total) by mouth 4 (four) times daily as needed for anxiety. 30 tablet 0  . metFORMIN (GLUCOPHAGE) 1000 MG tablet Take 500 mg by mouth 2 (two) times daily with a meal.    . metoprolol tartrate (LOPRESSOR) 25 MG tablet Take 0.5 tablets (12.5 mg total) by mouth 2 (two) times daily. 180 tablet 3  . pantoprazole (PROTONIX) 40 MG tablet     . traMADol (ULTRAM) 50 MG tablet Take 1 tablet (50 mg total) by mouth every 6 (six) hours as needed. 60 tablet 0   No current facility-administered medications for this visit.     Past Surgical History  Procedure Laterality Date  . Neck surgery      x 2 for spur and disc problems  . Colonoscopy    . Colonoscopy N/A 05/08/2013    Procedure: COLONOSCOPY;  Surgeon: Rogene Houston, MD;  Location: [facility];  Service: Endoscopy;  Laterality: N/A;  1015     Allergies  Allergen Reactions  . Prednisone Nausea And Vomiting      Family History Patient denies family history of heart disease.    Social History Ms. Dalgleish reports that she has never smoked. She does not have any smokeless tobacco history on file. Ms. Valenta reports that  she does not drink alcohol.   Review of Systems CONSTITUTIONAL: No weight loss, fever, chills, weakness or fatigue.  HEENT: Eyes: No visual loss, blurred vision, double vision or yellow sclerae.No hearing loss, sneezing, congestion, runny nose or sore throat.  SKIN: No rash or itching.  CARDIOVASCULAR: per HPI RESPIRATORY: No shortness of breath, cough or sputum.  GASTROINTESTINAL: No anorexia, nausea, vomiting or diarrhea. No abdominal pain or blood.  GENITOURINARY: No burning on urination, no polyuria NEUROLOGICAL: No headache, dizziness, syncope, paralysis, ataxia, numbness or tingling in the extremities. No change in bowel or bladder control.  MUSCULOSKELETAL: No muscle, back pain, joint pain or stiffness.  LYMPHATICS: No enlarged nodes. No history of splenectomy.  PSYCHIATRIC: No history of  depression or anxiety.  ENDOCRINOLOGIC: No reports of sweating, cold or heat intolerance. No polyuria or polydipsia.  Marland Kitchen   Physical Examination Filed Vitals:   03/10/16 0854  BP: 184/88  Pulse: 51    Filed Vitals:   03/10/16 0854  Height: _0  (1.651 m)  Weight: 139 lb (63.05 kg)    Gen: resting comfortably, no acute distress HEENT: no scleral icterus, pupils equal round and reactive, no palptable cervical adenopathy,  CV: RRR, no m/r/g, no jvd Resp: Clear to auscultation bilaterally GI: abdomen is soft, non-tender, non-distended, normal bowel sounds, no hepatosplenomegaly MSK: extremities are warm, no edema.  Skin: warm, no rash Neuro:  no focal deficits Psych: appropriate affect   Diagnostic Studies 05/2015 echo Study Conclusions  - Left ventricle: The cavity size was normal. There was moderate concentric hypertrophy. Systolic function was normal. The estimated ejection fraction was in the range of 60% to 65%. Wall motion was normal; there were no regional wall motion abnormalities. There was an increased relative contribution of atrial contraction to ventricular filling. Doppler parameters are consistent with abnormal left ventricular relaxation (grade 1 diastolic dysfunction). - Mitral valve: There was trivial regurgitation.   05/2015 Carotid US Summary: Bilateral: 1-39% ICA stenosis. Vertebral artery flow is antegrade.  Other specific details can be found in the table(s) above. Assessment and Plan   1. PAF - no current symptoms. We will continue current meds including anticoagulation.   2. HTN - above goal, she will submit bp log in 1 week   3. Hyperlipidemia - at goal, continue statin   F/u 6 months     Arnoldo Lenis, M.D.

## 2016-03-10 NOTE — Patient Instructions (Signed)
Your physician wants you to follow-up in: 6 months You will receive a reminder letter in the mail two months in advance. If you don't receive a letter, please call our office to schedule the follow-up appointment.    Your physician recommends that you continue on your current medications as directed. Please refer to the Current Medication list given to you today.    Keep BP log for 1 week and then drop off to us     Thank you for choosing [facility] !

## 2016-03-18 ENCOUNTER — Telehealth: Payer: Self-pay

## 2016-03-18 NOTE — Telephone Encounter (Signed)
-----   Message from Antoine PocheJonathan F Branch, MD sent at 03/18/2016  4:01 PM EDT ----- BP log reviewed, occasional high bp's but overall ok. We will continue current meds and continue to monitor.  Dominga FerryJ Branch MD

## 2016-03-18 NOTE — Telephone Encounter (Signed)
PT made aware. Voiced understanding.  

## 2016-06-01 NOTE — Patient Instructions (Signed)
Holly BoschCatherine P Sparks  06/01/2016     @PREFPERIOPPHARMACY @   Your procedure is scheduled on 06/06/2016.  [facility]  Call this number if you have problems the morning of surgery:  [phone]   Remember:  Do not eat food or drink liquids after midnight.  Take these medicines the morning of surgery with A SIP OF WATER Metoprolol, Protonix   Do not wear jewelry, make-up or nail polish.  Do not wear lotions, powders, or perfumes.  You may wear deoderant.  Do not shave 48 hours prior to surgery.  Men may shave face and neck.  Do not bring valuables to the hospital.  [facility] any belongings or valuables.  Contacts, dentures or bridgework may not be worn into surgery.  Leave your suitcase in the car.  After surgery it may be brought to your room.  For patients admitted to the hospital, discharge time will be determined by your treatment team.  Patients discharged the day of surgery will not be allowed to drive home.    Please read over the following fact sheets that you were given. Anesthesia Post-op Instructions     PATIENT INSTRUCTIONS POST-ANESTHESIA  IMMEDIATELY FOLLOWING SURGERY:  Do not drive or operate machinery for the first twenty four hours after surgery.  Do not make any important decisions for twenty four hours after surgery or while taking narcotic pain medications or sedatives.  If you develop intractable nausea and vomiting or a severe headache please notify your doctor immediately.  FOLLOW-UP:  Please make an appointment with your surgeon as instructed. You do not need to follow up with anesthesia unless specifically instructed to do so.  WOUND CARE INSTRUCTIONS (if applicable):  Keep a dry clean dressing on the anesthesia/puncture wound site if there is drainage.  Once the wound has quit draining you may leave it open to air.  Generally you should leave the bandage intact for twenty four hours unless there is drainage.   If the epidural site drains for more than 36-48 hours please call the anesthesia department.  QUESTIONS?:  Please feel free to call your physician or the hospital operator if you have any questions, and they will be happy to assist you.       A cataract is a clouding of the lens of the eye. When a lens becomes cloudy, vision is reduced based on the degree and nature of the clouding. Surgery may be needed to improve vision. Surgery removes the cloudy lens and usually replaces it with a substitute lens (intraocular lens, IOL). LET YOUR EYE DOCTOR KNOW ABOUT:  Allergies to food or medicine.  Medicines taken including herbs, eye drops, over-the-counter medicines, and creams.  Use of steroids (by mouth or creams).  Previous problems with anesthetics or numbing medicine.  History of bleeding problems or blood clots.  Previous surgery.  Other health problems, including diabetes and kidney problems.  Possibility of pregnancy, if this applies. RISKS AND COMPLICATIONS  Infection.  Inflammation of the eyeball (endophthalmitis) that can spread to both eyes (sympathetic ophthalmia).  Poor wound healing.  If an IOL is inserted, it can later fall out of proper position. This is very uncommon.  Clouding of the part of your eye that holds an IOL in place. This is called an "after-cataract." These are uncommon but easily treated. BEFORE THE PROCEDURE  Do not eat or drink anything except small amounts of water for 8 to 12 before your surgery,  or as directed by your caregiver.  Unless you are told otherwise, continue any eye drops you have been prescribed.  Talk to your primary caregiver about all other medicines that you take (both prescription and nonprescription). In some cases, you may need to stop or change medicines near the time of your surgery. This is most important if you are taking blood-thinning medicine.Do not stop medicines unless you are told to do so.  Arrange for someone to  drive you to and from the procedure.  Do not put contact lenses in either eye on the day of your surgery. PROCEDURE There is more than one method for safely removing a cataract. Your doctor can explain the differences and help determine which is best for you. Phacoemulsification surgery is the most common form of cataract surgery.  An injection is given behind the eye or eye drops are given to make this a painless procedure.  A small cut (incision) is made on the edge of the clear, dome-shaped surface that covers the front of the eye (cornea).  A tiny probe is painlessly inserted into the eye. This device gives off ultrasound waves that soften and break up the cloudy center of the lens. This makes it easier for the cloudy lens to be removed by suction.  An IOL may be implanted.  The normal lens of the eye is covered by a clear capsule. Part of that capsule is intentionally left in the eye to support the IOL.  Your surgeon may or may not use stitches to close the incision. There are other forms of cataract surgery that require a larger incision and stitches to close the eye. This approach is taken in cases where the doctor feels that the cataract cannot be easily removed using phacoemulsification. AFTER THE PROCEDURE  When an IOL is implanted, it does not need care. It becomes a permanent part of your eye and cannot be seen or felt.  Your doctor will schedule follow-up exams to check on your progress.  Review your other medicines with your doctor to see which can be resumed after surgery.  Use eye drops or take medicine as prescribed by your doctor.   This information is not intended to replace advice given to you by your health care provider. Make sure you discuss any questions you have with your health care provider.   Document Released: 11/03/2011 Document Revised: 12/05/2014 Document Reviewed: 11/03/2011 Elsevier Interactive Patient Education [employer].

## 2016-06-02 ENCOUNTER — Encounter (HOSPITAL_COMMUNITY)
Admission: RE | Admit: 2016-06-02 | Discharge: 2016-06-02 | Disposition: A | Discharge status: Home / Self Care | Payer: Medicare Other | Source: Ambulatory Visit | Attending: Ophthalmology | Admitting: Ophthalmology

## 2016-06-02 ENCOUNTER — Encounter (HOSPITAL_COMMUNITY): Payer: Self-pay

## 2016-06-02 DIAGNOSIS — Z01812 Encounter for preprocedural laboratory examination: Secondary | ICD-10-CM | POA: Insufficient documentation

## 2016-06-02 HISTORY — DX: Cardiac arrhythmia, unspecified: I49.9

## 2016-06-02 HISTORY — DX: Gastro-esophageal reflux disease without esophagitis: K21.9

## 2016-06-02 LAB — BASIC METABOLIC PANEL
ANION GAP: 5 (ref 5–15)
BUN: 19 mg/dL (ref 6–20)
CALCIUM: 9 mg/dL (ref 8.9–10.3)
CHLORIDE: 103 mmol/L (ref 101–111)
CO2: 29 mmol/L (ref 22–32)
Creatinine, Ser: 0.95 mg/dL (ref 0.44–1.00)
GFR calc Af Amer: >60 mL/min (ref >60)
GFR calc non Af Amer: 58 mL/min — ABNORMAL LOW (ref >60)
GLUCOSE: 141 mg/dL — AB (ref 65–99)
Potassium: 3.8 mmol/L (ref 3.5–5.1)
Sodium: 137 mmol/L (ref 135–145)

## 2016-06-02 LAB — CBC
HEMATOCRIT: 36.4 % (ref 36.0–46.0)
Hemoglobin: 12.3 g/dL (ref 12.0–15.0)
MCH: 29.3 pg (ref 26.0–34.0)
MCHC: 33.8 g/dL (ref 30.0–36.0)
MCV: 86.7 fL (ref 78.0–100.0)
Platelets: 127 10*3/uL — ABNORMAL LOW (ref 150–400)
RBC: 4.2 MIL/uL (ref 3.87–5.11)
RDW: 13.8 % (ref 11.5–15.5)
WBC: 5.2 10*3/uL (ref 4.0–10.5)

## 2016-06-06 ENCOUNTER — Encounter (HOSPITAL_COMMUNITY): Payer: Self-pay | Admitting: *Deleted

## 2016-06-06 ENCOUNTER — Ambulatory Visit (HOSPITAL_COMMUNITY): Payer: Medicare Other | Admitting: Anesthesiology

## 2016-06-06 ENCOUNTER — Encounter (HOSPITAL_COMMUNITY)
Admission: RE | Disposition: A | Discharge status: Home / Self Care | Payer: Self-pay | Source: Ambulatory Visit | Attending: Ophthalmology

## 2016-06-06 ENCOUNTER — Ambulatory Visit (HOSPITAL_COMMUNITY)
Admission: RE | Admit: 2016-06-06 | Discharge: 2016-06-06 | Disposition: A | Discharge status: Home / Self Care | Payer: Medicare Other | Source: Ambulatory Visit | Attending: Ophthalmology | Admitting: Ophthalmology

## 2016-06-06 DIAGNOSIS — H2512 Age-related nuclear cataract, left eye: Secondary | ICD-10-CM | POA: Insufficient documentation

## 2016-06-06 DIAGNOSIS — I4891 Unspecified atrial fibrillation: Secondary | ICD-10-CM | POA: Insufficient documentation

## 2016-06-06 DIAGNOSIS — I10 Essential (primary) hypertension: Secondary | ICD-10-CM | POA: Insufficient documentation

## 2016-06-06 DIAGNOSIS — Z8673 Personal history of transient ischemic attack (TIA), and cerebral infarction without residual deficits: Secondary | ICD-10-CM | POA: Diagnosis not present

## 2016-06-06 DIAGNOSIS — E1136 Type 2 diabetes mellitus with diabetic cataract: Secondary | ICD-10-CM | POA: Insufficient documentation

## 2016-06-06 DIAGNOSIS — Z7901 Long term (current) use of anticoagulants: Secondary | ICD-10-CM | POA: Insufficient documentation

## 2016-06-06 DIAGNOSIS — Z7984 Long term (current) use of oral hypoglycemic drugs: Secondary | ICD-10-CM | POA: Diagnosis not present

## 2016-06-06 HISTORY — PX: CATARACT EXTRACTION W/PHACO: SHX586

## 2016-06-06 LAB — GLUCOSE, CAPILLARY: Glucose-Capillary: 149 mg/dL — ABNORMAL HIGH (ref 65–99)

## 2016-06-06 SURGERY — PHACOEMULSIFICATION, CATARACT, WITH IOL INSERTION
Anesthesia: Monitor Anesthesia Care | Site: Eye | Laterality: Left

## 2016-06-06 MED ORDER — EPINEPHRINE HCL 1 MG/ML IJ SOLN
INTRAMUSCULAR | Status: DC | PRN
  Administered 2016-06-06: .5 mL via OPHTHALMIC

## 2016-06-06 MED ORDER — PROVISC 10 MG/ML IO SOLN
INTRAOCULAR | Status: DC | PRN
  Administered 2016-06-06: 0.85 mL via INTRAOCULAR

## 2016-06-06 MED ORDER — FENTANYL CITRATE (PF) 100 MCG/2ML IJ SOLN
INTRAMUSCULAR | Status: AC
  Filled 2016-06-06: qty 2

## 2016-06-06 MED ORDER — MIDAZOLAM HCL 2 MG/2ML IJ SOLN
1.0000 mg | INTRAMUSCULAR | Status: DC | PRN
  Administered 2016-06-06: 2 mg via INTRAVENOUS

## 2016-06-06 MED ORDER — EPINEPHRINE HCL 1 MG/ML IJ SOLN
INTRAOCULAR | Status: DC | PRN
  Administered 2016-06-06: 500 mL

## 2016-06-06 MED ORDER — EPINEPHRINE HCL 1 MG/ML IJ SOLN
INTRAMUSCULAR | Status: AC
  Filled 2016-06-06: qty 1

## 2016-06-06 MED ORDER — LIDOCAINE HCL 3.5 % OP GEL
1.0000 "application " | Freq: Once | OPHTHALMIC | Status: AC
  Administered 2016-06-06: 1 via OPHTHALMIC

## 2016-06-06 MED ORDER — CYCLOPENTOLATE-PHENYLEPHRINE 0.2-1 % OP SOLN
1.0000 [drp] | OPHTHALMIC | Status: AC
  Administered 2016-06-06 (×3): 1 [drp] via OPHTHALMIC

## 2016-06-06 MED ORDER — FENTANYL CITRATE (PF) 100 MCG/2ML IJ SOLN
25.0000 ug | INTRAMUSCULAR | Status: DC | PRN
Start: 2016-06-06 — End: 2016-06-06
  Administered 2016-06-06: 25 ug via INTRAVENOUS

## 2016-06-06 MED ORDER — BSS IO SOLN
INTRAOCULAR | Status: DC | PRN
  Administered 2016-06-06: 15 mL

## 2016-06-06 MED ORDER — LIDOCAINE 3.5 % OP GEL OPTIME - NO CHARGE
OPHTHALMIC | Status: DC | PRN
  Administered 2016-06-06: 1 [drp] via OPHTHALMIC

## 2016-06-06 MED ORDER — NEOMYCIN-POLYMYXIN-DEXAMETH 3.5-10000-0.1 OP SUSP
OPHTHALMIC | Status: DC | PRN
  Administered 2016-06-06: 2 [drp] via OPHTHALMIC

## 2016-06-06 MED ORDER — POVIDONE-IODINE 5 % OP SOLN
OPHTHALMIC | Status: DC | PRN
  Administered 2016-06-06: 1 via OPHTHALMIC

## 2016-06-06 MED ORDER — PHENYLEPHRINE HCL 2.5 % OP SOLN
1.0000 [drp] | OPHTHALMIC | Status: AC
  Administered 2016-06-06 (×3): 1 [drp] via OPHTHALMIC

## 2016-06-06 MED ORDER — TETRACAINE HCL 0.5 % OP SOLN
1.0000 [drp] | OPHTHALMIC | Status: AC
  Administered 2016-06-06 (×3): 1 [drp] via OPHTHALMIC

## 2016-06-06 MED ORDER — LACTATED RINGERS IV SOLN
INTRAVENOUS | Status: DC
Start: 2016-06-06 — End: 2016-06-06
  Administered 2016-06-06: 10:00:00 via INTRAVENOUS

## 2016-06-06 MED ORDER — MIDAZOLAM HCL 2 MG/2ML IJ SOLN
INTRAMUSCULAR | Status: AC
  Filled 2016-06-06: qty 2

## 2016-06-06 SURGICAL SUPPLY — 11 items

## 2016-06-06 NOTE — Anesthesia Postprocedure Evaluation (Signed)
Anesthesia Post Note  Patient: Holly BoschCatherine P Sparks  Procedure(s) Performed: Procedure(s) (LRB): CATARACT EXTRACTION PHACO AND INTRAOCULAR LENS PLACEMENT (IOC) (Left)  Patient location during evaluation: Short Stay Anesthesia Type: MAC Level of consciousness: awake and alert Pain management: pain level controlled Vital Signs Assessment: post-procedure vital signs reviewed and stable Respiratory status: spontaneous breathing Cardiovascular status: blood pressure returned to baseline Postop Assessment: adequate PO intake Anesthetic complications: no    Last Vitals:  Filed Vitals:   06/06/16 0955 06/06/16 1000  BP:    Pulse:    Temp:    Resp: 18 16    Last Pain: There were no vitals filed for this visit.               Killian Schwer

## 2016-06-06 NOTE — Anesthesia Preprocedure Evaluation (Signed)
Anesthesia Evaluation  Patient identified by MRN, date of birth, ID band Patient awake    Reviewed: Allergy & Precautions, NPO status , Patient's Chart, lab work & pertinent test results  Airway Mallampati: II  TM Distance: >3 FB     Dental  (+) Teeth Intact   Pulmonary neg pulmonary ROS,    breath sounds clear to auscultation       Cardiovascular hypertension, Pt. on medications + dysrhythmias Atrial Fibrillation  Rhythm:Irregular Rate:Normal     Neuro/Psych CVA, No Residual Symptoms    GI/Hepatic GERD  Medicated and Controlled,  Endo/Other  diabetes, Type 2, Oral Hypoglycemic Agents  Renal/GU      Musculoskeletal   Abdominal   Peds  Hematology   Anesthesia Other Findings   Reproductive/Obstetrics                             Anesthesia Physical Anesthesia Plan  ASA: III  Anesthesia Plan: MAC   Post-op Pain Management:    Induction:   Airway Management Planned: Nasal Cannula  Additional Equipment:   Intra-op Plan:   Post-operative Plan:   Informed Consent: I have reviewed the patients History and Physical, chart, labs and discussed the procedure including the risks, benefits and alternatives for the proposed anesthesia with the patient or authorized representative who has indicated his/her understanding and acceptance.     Plan Discussed with:   Anesthesia Plan Comments:         Anesthesia Quick Evaluation

## 2016-06-06 NOTE — H&P (Signed)
I have reviewed the H&P, the patient was re-examined, and I have identified no interval changes in medical condition and plan of care since the history and physical of record  

## 2016-06-06 NOTE — Discharge Instructions (Signed)
Anesthesia, Adult, Care After °Refer to this sheet in the next few weeks. These instructions provide you with information on caring for yourself after your procedure. Your health care provider may also give you more specific instructions. Your treatment has been planned according to current medical practices, but problems sometimes occur. Call your health care provider if you have any problems or questions after your procedure. °WHAT TO EXPECT AFTER THE PROCEDURE °After the procedure, it is typical to experience: °· Sleepiness. °· Nausea and vomiting. °HOME CARE INSTRUCTIONS °· For the first 24 hours after general anesthesia: °¨ Have a responsible person with you. °¨ Do not drive a car. If you are alone, do not take public transportation. °¨ Do not drink alcohol. °¨ Do not take medicine that has not been prescribed by your health care provider. °¨ Do not sign important papers or make important decisions. °¨ You may resume a normal diet and activities as directed by your health care provider. °· Change bandages (dressings) as directed. °· If you have questions or problems that seem related to general anesthesia, call the hospital and ask for the anesthetist or anesthesiologist on call. °SEEK MEDICAL CARE IF: °· You have nausea and vomiting that continue the day after anesthesia. °· You develop a rash. °SEEK IMMEDIATE MEDICAL CARE IF:  °· You have difficulty breathing. °· You have chest pain. °· You have any allergic problems. °  °This information is not intended to replace advice given to you by your health care provider. Make sure you discuss any questions you have with your health care provider. °  °Document Released: 02/20/2001 Document Revised: 12/05/2014 Document Reviewed: 03/14/2012 °Elsevier Interactive Patient Education ©2016 Elsevier Inc. ° °

## 2016-06-06 NOTE — Transfer of Care (Signed)
Immediate Anesthesia Transfer of Care Note  Patient: Holly BoschCatherine P Buren  Procedure(s) Performed: Procedure(s) with comments: CATARACT EXTRACTION PHACO AND INTRAOCULAR LENS PLACEMENT (IOC) (Left) - CDE: 8.32  Patient Location: Short Stay  Anesthesia Type:MAC  Level of Consciousness: awake  Airway & Oxygen Therapy: Patient Spontanous Breathing  Post-op Assessment: Report given to RN  Post vital signs: Reviewed  Last Vitals:  Filed Vitals:   06/06/16 0955 06/06/16 1000  BP:    Pulse:    Temp:    Resp: 18 16    Last Pain: There were no vitals filed for this visit.    Patients Stated Pain Goal: 8 (06/06/16 0932)  Complications: No apparent anesthesia complications

## 2016-06-06 NOTE — Op Note (Signed)
Date of Admission: 06/06/2016  Date of Surgery: 06/06/2016   Pre-Op Dx: Cataract Left Eye  Post-Op Dx: Senile Nuclear Cataract Left  Eye,  Dx Code H25.12  Surgeon: Gemma PayorKerry Caetano Oberhaus, M.D.  Assistants: None  Anesthesia: Topical with MAC  Indications: Painless, progressive loss of vision with compromise of daily activities.  Surgery: Cataract Extraction with Intraocular lens Implant Left Eye  Discription: The patient had dilating drops and viscous lidocaine placed into the Left eye in the pre-op holding area. After transfer to the operating room, a time out was performed. The patient was then prepped and draped. Beginning with a 75 degree blade a paracentesis port was made at the surgeon's 2 o'clock position. The anterior chamber was then filled with 1% non-preserved lidocaine. This was followed by filling the anterior chamber with Provisc.  A 2.744mm keratome blade was used to make a clear corneal incision at the temporal limbus.  A bent cystatome needle was used to create a continuous tear capsulotomy. Hydrodissection was performed with balanced salt solution on a Fine canula. The lens nucleus was then removed using the phacoemulsification handpiece. Residual cortex was removed with the I&A handpiece. The anterior chamber and capsular bag were refilled with Provisc. A posterior chamber intraocular lens was placed into the capsular bag with it's injector. The implant was positioned with the Kuglan hook. The Provisc was then removed from the anterior chamber and capsular bag with the I&A handpiece. Stromal hydration of the main incision and paracentesis port was performed with BSS on a Fine canula. The wounds were tested for leak which was negative. The patient tolerated the procedure well. There were no operative complications. The patient was then transferred to the recovery room in stable condition.  Complications: None  Specimen: None  EBL: None  Prosthetic device: Hoya iSert 250, power 21.0 D, SN  [id number].

## 2016-06-09 ENCOUNTER — Encounter (HOSPITAL_COMMUNITY): Payer: Self-pay | Admitting: Ophthalmology

## 2016-06-20 ENCOUNTER — Ambulatory Visit: Payer: Medicare Other | Admitting: Nurse Practitioner

## 2016-07-12 ENCOUNTER — Ambulatory Visit (INDEPENDENT_AMBULATORY_CARE_PROVIDER_SITE_OTHER): Payer: Medicare Other | Admitting: Nurse Practitioner

## 2016-07-12 ENCOUNTER — Encounter: Payer: Self-pay | Admitting: Nurse Practitioner

## 2016-07-12 VITALS — BP 144/82 | HR 52 | Ht 66.0 in | Wt 139.4 lb

## 2016-07-12 DIAGNOSIS — I63512 Cerebral infarction due to unspecified occlusion or stenosis of left middle cerebral artery: Secondary | ICD-10-CM | POA: Diagnosis not present

## 2016-07-12 DIAGNOSIS — I480 Paroxysmal atrial fibrillation: Secondary | ICD-10-CM

## 2016-07-12 DIAGNOSIS — I10 Essential (primary) hypertension: Secondary | ICD-10-CM | POA: Diagnosis not present

## 2016-07-12 DIAGNOSIS — E1159 Type 2 diabetes mellitus with other circulatory complications: Secondary | ICD-10-CM | POA: Diagnosis not present

## 2016-07-12 DIAGNOSIS — E7800 Pure hypercholesterolemia, unspecified: Secondary | ICD-10-CM | POA: Diagnosis not present

## 2016-07-12 NOTE — Patient Instructions (Addendum)
Keep systolic blood pressure less than 130, today's reading today's reading 144/82 Follow-up with your primary care for stroke risk factor modification Diabetes with hemoglobin A1c below 6.5 continue Glucophage Continue follow-up with cardiology for atrial fibrillation Dr. Wyline MoodBranch Lipids are followed by PCP, continue Lipitor Continue eliquis  for atrial fibrillation and secondary stroke prevention No further stroke or TIA symptoms since 06/13/15 If recurrent stroke symptoms occur, call 911 and proceed to the hospital Discharge from neurologic services at this time

## 2016-07-12 NOTE — Progress Notes (Addendum)
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 Kendal HymenXuMs. Holly Sparks Sauceda is a 75 y.o. female with history of DM, HTN, HLD and remote atrial fibrillation not on anticoagulation was admitted on 06/13/15 for expressive and receptive aphasia. 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 MRA, carotid Doppler, 2-D echo unremarkable. LDL 70, and A1c 11.8. Stroke etiology likely due to A. fib not on anticoagulation. She was put on eliquis and discharged to CIR.  Interval History10/3/16 Dr. Roda ShuttersXu During the interval time, the patient has been doing well. Language much improved. Continued to have speech therapy twice a week. She still has some word finding difficulties intermittently but able to understand. Has cane for walking but prefer not to use it. BP 108/67 today in clinic.  UPDATE 12/22/2015 Ms Holly Sparks, 75 year old female returns for follow-up. She presented 06/13/2015 for expressive and receptive aphasia. MRI confirmed left MCA territory infarct. When last seen she was continuing to get speech therapy but that has stopped. She has rare word finding difficulties now. She denies any falls and she does not use an assistive device. She checks her CBGs at home weekly last was 130. She remains on Eloquis for secondary stroke prevention and atrial fibrillation.Cardiologist is Dr. Wyline MoodBranch.  She returns for follow-up UPDATE 08/15/2017CM [name], 75 year old female returns for follow-up. She has history of left MCA territory infarct July 2016. She also has a history of atrial fibrillation and is on eliquis. She has not had further stroke or TIA symptoms. She has not had any falls and she does not use an  assistive device. Blood pressure mildly elevated in the office today at 144/82. Appetite is good and she is sleeping well she has no new neurologic complaints. She had recent cataract surgery She returns for reevaluation REVIEW OF SYSTEMS: Full 14 system review of systems performed and notable only for those listed, all others are neg:  Constitutional: neg  Cardiovascular: neg Ear/Nose/Throat: neg  Skin: neg Eyes:  Respiratory: neg Gastroitestinal: neg  Hematology/Lymphatic: neg  Endocrine: neg Musculoskeletal:neg Allergy/Immunology: neg Neurological: Memory loss Psychiatric: Anxiety Sleep : neg   ALLERGIES: No Active Allergies  HOME MEDICATIONS: Outpatient Medications Prior to Visit  Medication Sig Dispense Refill  . apixaban (ELIQUIS) 5 MG TABS tablet Take 1 tablet (5 mg total) by mouth 2 (two) times daily. 60 tablet 1  . atorvastatin (LIPITOR) 20 MG tablet Take 1 tablet (20 mg total) by mouth at bedtime. 30 tablet 1  . lisinopril-hydrochlorothiazide (PRINZIDE,ZESTORETIC) 20-25 MG per tablet Take 1 tablet by mouth daily. For high blood pressure (Patient taking differently: Take by mouth daily. For high blood pressure  1/2 tabl daily.) 30 tablet 1  . metFORMIN (GLUCOPHAGE) 1000 MG tablet Take 500 mg by mouth 2 (two) times daily with a meal.    . metoprolol tartrate (LOPRESSOR) 25 MG tablet Take 0.5 tablets (12.5 mg total) by mouth 2 (two) times daily. 180 tablet 3   No facility-administered medications prior to visit.     PAST MEDICAL HISTORY: Past Medical History:  Diagnosis Date  . Diabetes (HCC)    Type 2 x 10 yrs  . Dysrhythmia    AFib  . GERD (gastroesophageal reflux disease)   . High cholesterol   . Hypertension  x 10 yrs  . Paroxysmal a-fib: Remote hx of afib 06/15/2015  . Stroke [facility])    no deficits    PAST SURGICAL HISTORY: Past Surgical History:  Procedure Laterality Date  . CATARACT EXTRACTION W/PHACO Left 06/06/2016   Procedure: CATARACT EXTRACTION  PHACO AND INTRAOCULAR LENS PLACEMENT (IOC);  Surgeon: Gemma Payor, MD;  Location: [facility] ORS;  Service: Ophthalmology;  Laterality: Left;  CDE: 8.32  . COLONOSCOPY    . COLONOSCOPY N/A 05/08/2013   Procedure: COLONOSCOPY;  Surgeon: Malissa Hippo, MD;  Location: [facility] ENDO SUITE;  Service: Endoscopy;  Laterality: N/A;  1015  . NECK SURGERY     x 2 for spur and disc problems    FAMILY HISTORY: No family history on file.  SOCIAL HISTORY: Social History   Social History  . Marital status: Married    Spouse name: N/A  . Number of children: N/A  . Years of education: N/A   Occupational History  . Not on file.   Social History Main Topics  . Smoking status: Never Smoker  . Smokeless tobacco: Never Used  . Alcohol use No  . Drug use: No  . Sexual activity: No   Other Topics Concern  . Not on file   Social History Narrative  . No narrative on file     PHYSICAL EXAM  Vitals:   07/12/16 0904  BP: (!) 144/82  Pulse: (!) 52  Weight: 139 lb 6.4 oz (63.2 kg)  Height: 5\' 6"  (1.676 m)   Body mass index is 22.5 kg/m.  Generalized: Well developed, in no acute distress  Head: normocephalic and atraumatic,. Oropharynx benign  Neck: Supple, no carotid bruits  Cardiac: Regular rate rhythm, no murmur  Musculoskeletal: No deformity   Neurological examination   Mentation: Alert oriented to time, place, history taking. Attention span and concentration appropriate.   Follows all commands speech and language normal. Follows three-step commands   Cranial nerve II-XII: Fundoscopic exam not done.Pupils were equal round reactive to light extraocular movements were full, visual field were full on confrontational test. Facial sensation and strength were normal. hearing was intact to finger rubbing bilaterally. Uvula tongue midline. head turning and shoulder shrug were normal and symmetric.Tongue protrusion into cheek strength was normal. Motor: normal bulk and tone, full strength in the BUE, BLE,  fine finger movements normal, no pronator drift. No focal weakness Sensory: normal and symmetric to light touch, pinprick, and  Vibration in the upper and lower extremities,  Coordination: finger-nose-finger, heel-to-shin bilaterally, no dysmetria Reflexes: 1+ upper lower and symmetric, plantar responses were flexor bilaterally. Gait and Station: Rising up from seated position without assistance, slow cautious gait, no difficulty with turns, tandem gait is steady, no assistive device  DIAGNOSTIC DATA (LABS, IMAGING, TESTING) - I reviewed patient records, labs, notes, testing and imaging myself where available.  Lab Results  Component Value Date   WBC 5.2 06/02/2016   HGB 12.3 06/02/2016   HCT 36.4 06/02/2016   MCV 86.7 06/02/2016   PLT 127 (L) 06/02/2016      Component Value Date/Time   NA 137 06/02/2016 1305   K 3.8 06/02/2016 1305   CL 103 06/02/2016 1305   CO2 29 06/02/2016 1305   GLUCOSE 141 (H) 06/02/2016 1305   BUN 19 06/02/2016 1305   CREATININE 0.95 06/02/2016 1305   CALCIUM 9.0 06/02/2016 1305   PROT 6.2 (L) 06/17/2015 0515   ALBUMIN 3.2 (L) 06/17/2015 0515   AST 26 06/17/2015 0515   ALT 15  06/17/2015 0515   ALKPHOS 68 06/17/2015 0515   BILITOT 1.0 06/17/2015 0515   GFRNONAA 58 (L) 06/02/2016 1305   GFRAA >60 06/02/2016 1305     ASSESSMENT AND PLAN  75 y.o. year old female with PMH of DM, HTN, HLD and remote atrial fibrillation not on anticoagulation was admitted on 06/13/15 for expressive and receptive  aphasia. MRI showed acute left MCA territory infarct. Other stroke work up negative. LDL 70, and A1c 11.8. Stroke etiology likely due to A. fib not on anticoagulation. She was put on eliquis . The patient is a current patient of Dr. Roda ShuttersXu who is out of the office today . This note is sent to the work in doctor.     PLAN:  Keep systolic blood pressure less than 130, today's reading today's reading 144/82 Follow-up with your primary care for stroke risk factor  modification Diabetes with hemoglobin A1c below 6.5 continue Glucophage Continue follow-up with cardiology for atrial fibrillation Dr. Wyline MoodBranch Lipids are followed by PCP, continue Lipitor Continue eliquis  for atrial fibrillation and secondary stroke prevention No further stroke or TIA symptoms since 06/13/15 If recurrent stroke symptoms occur, call 911 and proceed to the hospital Discharge from neurologic services at this time Nilda RiggsNancy Carolyn Sharese Manrique, [facility] LLCGNP, [facility] HospitalBC, APRN  [facility] [address], [address] [place], [place] [address] [phone]  I reviewed the above note and documentation by the Nurse Practitioner and agree with the history, physical exam, assessment and plan as outlined above. I was immediately available for face-to-face consultation. Huston FoleySaima Athar, MD, PhD [facility]

## 2017-02-28 ENCOUNTER — Other Ambulatory Visit: Payer: Self-pay | Admitting: Cardiology

## 2018-01-21 ENCOUNTER — Other Ambulatory Visit: Payer: Self-pay

## 2018-01-21 ENCOUNTER — Encounter (HOSPITAL_COMMUNITY): Payer: Self-pay | Admitting: Emergency Medicine

## 2018-01-21 ENCOUNTER — Emergency Department (HOSPITAL_COMMUNITY)
Admission: EM | Admit: 2018-01-21 | Discharge: 2018-01-21 | Disposition: A | Discharge status: Home / Self Care | Payer: Medicare Other | Attending: Emergency Medicine | Admitting: Emergency Medicine

## 2018-01-21 ENCOUNTER — Emergency Department (HOSPITAL_COMMUNITY): Payer: Medicare Other

## 2018-01-21 DIAGNOSIS — R531 Weakness: Secondary | ICD-10-CM | POA: Diagnosis present

## 2018-01-21 DIAGNOSIS — I4891 Unspecified atrial fibrillation: Secondary | ICD-10-CM | POA: Insufficient documentation

## 2018-01-21 DIAGNOSIS — Z79899 Other long term (current) drug therapy: Secondary | ICD-10-CM | POA: Diagnosis not present

## 2018-01-21 DIAGNOSIS — Z7901 Long term (current) use of anticoagulants: Secondary | ICD-10-CM | POA: Diagnosis not present

## 2018-01-21 DIAGNOSIS — I10 Essential (primary) hypertension: Secondary | ICD-10-CM | POA: Insufficient documentation

## 2018-01-21 DIAGNOSIS — Z7984 Long term (current) use of oral hypoglycemic drugs: Secondary | ICD-10-CM | POA: Diagnosis not present

## 2018-01-21 DIAGNOSIS — E119 Type 2 diabetes mellitus without complications: Secondary | ICD-10-CM | POA: Insufficient documentation

## 2018-01-21 LAB — COMPREHENSIVE METABOLIC PANEL
ALK PHOS: 69 U/L (ref 38–126)
ALT: 10 U/L — AB (ref 14–54)
ANION GAP: 12 (ref 5–15)
AST: 20 U/L (ref 15–41)
Albumin: 3.8 g/dL (ref 3.5–5.0)
BILIRUBIN TOTAL: 0.8 mg/dL (ref 0.3–1.2)
BUN: 19 mg/dL (ref 6–20)
CALCIUM: 9.1 mg/dL (ref 8.9–10.3)
CO2: 29 mmol/L (ref 22–32)
CREATININE: 1.15 mg/dL — AB (ref 0.44–1.00)
Chloride: 100 mmol/L — ABNORMAL LOW (ref 101–111)
GFR calc Af Amer: 52 mL/min — ABNORMAL LOW (ref >60)
GFR, EST NON AFRICAN AMERICAN: 45 mL/min — AB (ref >60)
Glucose, Bld: 135 mg/dL — ABNORMAL HIGH (ref 65–99)
Potassium: 3.7 mmol/L (ref 3.5–5.1)
Sodium: 141 mmol/L (ref 135–145)
TOTAL PROTEIN: 7.2 g/dL (ref 6.5–8.1)

## 2018-01-21 LAB — CBC WITH DIFFERENTIAL/PLATELET
Basophils Absolute: 0 10*3/uL (ref 0.0–0.1)
Basophils Relative: 0 %
Eosinophils Absolute: 0 10*3/uL (ref 0.0–0.7)
Eosinophils Relative: 0 %
HEMATOCRIT: 39.2 % (ref 36.0–46.0)
HEMOGLOBIN: 12.5 g/dL (ref 12.0–15.0)
LYMPHS PCT: 31 %
Lymphs Abs: 1.5 10*3/uL (ref 0.7–4.0)
MCH: 27.4 pg (ref 26.0–34.0)
MCHC: 31.9 g/dL (ref 30.0–36.0)
MCV: 85.8 fL (ref 78.0–100.0)
MONOS PCT: 5 %
Monocytes Absolute: 0.3 10*3/uL (ref 0.1–1.0)
NEUTROS PCT: 64 %
Neutro Abs: 3 10*3/uL (ref 1.7–7.7)
Platelets: 156 10*3/uL (ref 150–400)
RBC: 4.57 MIL/uL (ref 3.87–5.11)
RDW: 13.9 % (ref 11.5–15.5)
WBC: 4.8 10*3/uL (ref 4.0–10.5)

## 2018-01-21 LAB — CBG MONITORING, ED: GLUCOSE-CAPILLARY: 149 mg/dL — AB (ref 65–99)

## 2018-01-21 LAB — TROPONIN I: Troponin I: <0.03 ng/mL (ref <0.03)

## 2018-01-21 MED ORDER — DILTIAZEM HCL ER COATED BEADS 120 MG PO CP24: 120.0000 mg | ORAL_CAPSULE | Freq: Every day | ORAL | 0 refills | Status: AC

## 2018-01-21 MED ORDER — DILTIAZEM HCL ER COATED BEADS 120 MG PO CP24
120.0000 mg | ORAL_CAPSULE | Freq: Every day | ORAL | Status: DC
  Administered 2018-01-21: 120 mg via ORAL
  Filled 2018-01-21 (×3): qty 1

## 2018-01-21 MED ORDER — METOPROLOL TARTRATE 5 MG/5ML IV SOLN
5.0000 mg | Freq: Once | INTRAVENOUS | Status: AC
  Administered 2018-01-21: 5 mg via INTRAVENOUS
  Filled 2018-01-21: qty 5

## 2018-01-21 MED ORDER — METOPROLOL TARTRATE 5 MG/5ML IV SOLN
5.0000 mg | Freq: Once | INTRAVENOUS | Status: AC
Start: 2018-01-21 — End: 2018-01-21
  Administered 2018-01-21: 5 mg via INTRAVENOUS
  Filled 2018-01-21: qty 5

## 2018-01-21 MED ORDER — DILTIAZEM HCL 25 MG/5ML IV SOLN
10.0000 mg | Freq: Once | INTRAVENOUS | Status: AC
  Administered 2018-01-21: 10 mg via INTRAVENOUS
  Filled 2018-01-21: qty 5

## 2018-01-21 NOTE — Discharge Instructions (Signed)
Follow-up with your family doctor or cardiologist this week for recheck.  Start this new medicine tomorrow

## 2018-01-21 NOTE — ED Notes (Signed)
EKG given to MD Zammit and informed of pt's complaint.

## 2018-01-21 NOTE — ED Triage Notes (Signed)
Pt reports generalized weakness started 1000 this morning. She ate a piece of toast this morning and went to church feeling normal. Per family a RN at church stated her heart was "palpitating and her sugar was low". No slurred speech, unilateral weakness, or facial droop.

## 2018-01-22 NOTE — ED Provider Notes (Signed)
[facility] EMERGENCY DEPARTMENT Provider Note   CSN: [id number] Arrival date & time: 01/21/18  1116     History   Chief Complaint Chief Complaint  Patient presents with  . Weakness    HPI RADIANCE DEADY is a 77 y.o. female.  Patient complains of palpitations and weakness   The history is provided by the patient. No language interpreter was used.  Palpitations   This is a new problem. The current episode started 6 to 12 hours ago. The problem occurs constantly. The problem has not changed since onset.The problem is associated with an unknown factor. Pertinent negatives include no diaphoresis, no chest pain, no abdominal pain, no headaches, no back pain and no cough.    Past Medical History:  Diagnosis Date  . Diabetes ([place])    Type 2 x 10 yrs  . Dysrhythmia    AFib  . GERD (gastroesophageal reflux disease)   . High cholesterol   . Hypertension    x 10 yrs  . Paroxysmal a-fib: Remote hx of afib 06/15/2015  . Stroke [facility])    no deficits    Patient Active Problem List   Diagnosis Date Noted  . Paroxysmal atrial fibrillation ([place]) 08/31/2015  . Chronic anticoagulation 08/31/2015  . Type 2 diabetes mellitus with circulatory disorder ([place]) 08/31/2015  . Left middle cerebral artery stroke ([place]) 06/16/2015  . Paroxysmal a-fib: Remote hx of afib 06/15/2015  . Cerebral infarction due to embolism of left middle cerebral artery ([place])   . Essential hypertension   . Stroke-like symptoms 06/13/2015  . HLD (hyperlipidemia) 06/13/2015  . DDD (degenerative disc disease), cervical 06/13/2015  . Elevated serum creatinine 06/13/2015  . Aphasia 06/13/2015  . Expressive aphasia   . Cerebral infarction due to unspecified mechanism   . Type 2 diabetes mellitus without complication ([place])   . IBS (irritable bowel syndrome) 04/24/2013  . High cholesterol 04/24/2013  . Diabetes ([place]) 04/24/2013  . Essential hypertension, benign 04/24/2013    Past Surgical History:  Procedure  Laterality Date  . CATARACT EXTRACTION W/PHACO Left 06/06/2016   Procedure: CATARACT EXTRACTION PHACO AND INTRAOCULAR LENS PLACEMENT (IOC);  Surgeon: Tonny Branch, MD;  Location: [facility] ORS;  Service: Ophthalmology;  Laterality: Left;  CDE: 8.32  . COLONOSCOPY    . COLONOSCOPY N/A 05/08/2013   Procedure: COLONOSCOPY;  Surgeon: Rogene Houston, MD;  Location: [facility] ENDO SUITE;  Service: Endoscopy;  Laterality: N/A;  1015  . NECK SURGERY     x 2 for spur and disc problems    OB History    No data available       Home Medications    Prior to Admission medications   Medication Sig Start Date End Date Taking? Authorizing Provider  apixaban (ELIQUIS) 5 MG TABS tablet Take 1 tablet (5 mg total) by mouth 2 (two) times daily. 06/26/15  Yes Angiulli, Lavon Paganini, PA-C  atorvastatin (LIPITOR) 20 MG tablet Take 1 tablet (20 mg total) by mouth at bedtime. 06/26/15  Yes Angiulli, Lavon Paganini, PA-C  lisinopril-hydrochlorothiazide (PRINZIDE,ZESTORETIC) 20-25 MG per tablet Take 1 tablet by mouth daily. For high blood pressure Patient taking differently: Take by mouth daily. For high blood pressure  1/2 tabl daily. 06/26/15  Yes Angiulli, Lavon Paganini, PA-C  metFORMIN (GLUCOPHAGE) 1000 MG tablet Take 500 mg by mouth 2 (two) times daily with a meal.   Yes [provider]  metoprolol tartrate (LOPRESSOR) 25 MG tablet TAKE ONE-HALF TABLET BY MOUTH TWICE A DAY 02/28/17  Yes Branch,  Alphonse Guild, MD  diltiazem (CARDIZEM CD) 120 MG 24 hr capsule Take 1 capsule (120 mg total) by mouth daily. 01/21/18   Milton Ferguson, MD    Family History History reviewed. No pertinent family history.  Social History Social History   Tobacco Use  . Smoking status: Never Smoker  . Smokeless tobacco: Never Used  Substance Use Topics  . Alcohol use: No    Alcohol/week: 0.0 oz  . Drug use: No     Allergies   Patient has no active allergies.   Review of Systems Review of Systems  Constitutional: Negative for appetite change,  diaphoresis and fatigue.  HENT: Negative for congestion, ear discharge and sinus pressure.   Eyes: Negative for discharge.  Respiratory: Negative for cough.   Cardiovascular: Positive for palpitations. Negative for chest pain.  Gastrointestinal: Negative for abdominal pain and diarrhea.  Genitourinary: Negative for frequency and hematuria.  Musculoskeletal: Negative for back pain.  Skin: Negative for rash.  Neurological: Negative for seizures and headaches.  Psychiatric/Behavioral: Negative for hallucinations.     Physical Exam Updated Vital Signs BP 140/79   Pulse 90   Temp 98.1 F (36.7 C) (Oral)   Resp 14   Wt 63.5 kg (140 lb)   SpO2 94%   BMI 22.60 kg/m   Physical Exam  Constitutional: She is oriented to person, place, and time. She appears well-developed.  HENT:  Head: Normocephalic.  Eyes: Conjunctivae and EOM are normal. No scleral icterus.  Neck: Neck supple. No thyromegaly present.  Cardiovascular: Exam reveals no gallop and no friction rub.  No murmur heard. Irregular rapid heart rate  Pulmonary/Chest: No stridor. She has no wheezes. She has no rales. She exhibits no tenderness.  Abdominal: She exhibits no distension. There is no tenderness. There is no rebound.  Musculoskeletal: Normal range of motion. She exhibits no edema.  Lymphadenopathy:    She has no cervical adenopathy.  Neurological: She is oriented to person, place, and time. She exhibits normal muscle tone. Coordination normal.  Skin: No rash noted. No erythema.  Psychiatric: She has a normal mood and affect. Her behavior is normal.     ED Treatments / Results  Labs (all labs ordered are listed, but only abnormal results are displayed) Labs Reviewed  COMPREHENSIVE METABOLIC PANEL - Abnormal; Notable for the following components:      Result Value   Chloride 100 (*)    Glucose, Bld 135 (*)    Creatinine, Ser 1.15 (*)    ALT 10 (*)    GFR calc non Af Amer 45 (*)    GFR calc Af Amer 52 (*)      All other components within normal limits  CBG MONITORING, ED - Abnormal; Notable for the following components:   Glucose-Capillary 149 (*)    All other components within normal limits  CBC WITH DIFFERENTIAL/PLATELET  TROPONIN I    EKG  EKG Interpretation None       EKG Interpretation  Date/Time:  Sunday January 21 2018 11:30:25 EST Ventricular Rate:  113 PR Interval:    QRS Duration: 86 QT Interval:  349 QTC Calculation: 479 R Axis:   -59 Text Interpretation:  Atrial fibrillation Left anterior fascicular block Probable LVH with secondary repol abnrm Anterior Q waves, possibly due to LVH Baseline wander in lead(s) V6 Confirmed by Milton Ferguson [phone]) on 01/22/2018 7:23:21 PM        Radiology Dg Chest 2 View  Result Date: 01/21/2018 CLINICAL DATA:  Short of breath  EXAM: CHEST  2 VIEW COMPARISON:  06/24/2015 FINDINGS: Normal heart size. Lungs clear. No pneumothorax. No pleural effusion. IMPRESSION: No active cardiopulmonary disease. Electronically Signed   By: Marybelle Killings M.D.   On: 01/21/2018 12:56    Procedures Procedures (including critical care time)  Medications Ordered in ED Medications  metoprolol tartrate (LOPRESSOR) injection 5 mg (5 mg Intravenous Given 01/21/18 1228)  metoprolol tartrate (LOPRESSOR) injection 5 mg (5 mg Intravenous Given 01/21/18 1330)  diltiazem (CARDIZEM) injection 10 mg (10 mg Intravenous Given 01/21/18 1501)     Initial Impression / Assessment and Plan / ED Course  I have reviewed the triage vital signs and the nursing notes.  Pertinent labs & imaging results that were available during my care of the patient were reviewed by me and considered in my medical decision making (see chart for details).     Patient with atrial fib that rapid rate.  Labs including C met CBC and troponin were unremarkable.  Patient symptoms and palpitations were controlled with Cardizem.  She is discharged home on Cardizem is to follow-up with her primary  care doctor and a cardiologist  Final Clinical Impressions(s) / ED Diagnoses   Final diagnoses:  Atrial fibrillation with rapid ventricular response [facility])    ED Discharge Orders        Ordered    diltiazem (CARDIZEM CD) 120 MG 24 hr capsule  Daily     01/21/18 1516       Milton Ferguson, MD 01/22/18 1925

## 2018-04-20 ENCOUNTER — Other Ambulatory Visit (HOSPITAL_COMMUNITY)
Admission: RE | Admit: 2018-04-20 | Discharge: 2018-04-20 | Disposition: A | Discharge status: Home / Self Care | Payer: Medicare Other | Source: Ambulatory Visit | Attending: Family Medicine | Admitting: Family Medicine

## 2018-04-20 DIAGNOSIS — R600 Localized edema: Secondary | ICD-10-CM | POA: Insufficient documentation

## 2018-04-20 DIAGNOSIS — I482 Chronic atrial fibrillation: Secondary | ICD-10-CM | POA: Insufficient documentation

## 2018-04-20 LAB — CBC
HEMATOCRIT: 39.7 % (ref 36.0–46.0)
Hemoglobin: 12.9 g/dL (ref 12.0–15.0)
MCH: 28.6 pg (ref 26.0–34.0)
MCHC: 32.5 g/dL (ref 30.0–36.0)
MCV: 88 fL (ref 78.0–100.0)
PLATELETS: 193 10*3/uL (ref 150–400)
RBC: 4.51 MIL/uL (ref 3.87–5.11)
RDW: 14.8 % (ref 11.5–15.5)
WBC: 8.6 10*3/uL (ref 4.0–10.5)

## 2018-04-20 LAB — COMPREHENSIVE METABOLIC PANEL
ALT: 10 U/L — AB (ref 14–54)
AST: 18 U/L (ref 15–41)
Albumin: 3.8 g/dL (ref 3.5–5.0)
Alkaline Phosphatase: 63 U/L (ref 38–126)
Anion gap: 11 (ref 5–15)
BILIRUBIN TOTAL: 1.3 mg/dL — AB (ref 0.3–1.2)
BUN: 17 mg/dL (ref 6–20)
CALCIUM: 9.1 mg/dL (ref 8.9–10.3)
CO2: 28 mmol/L (ref 22–32)
CREATININE: 1.03 mg/dL — AB (ref 0.44–1.00)
Chloride: 99 mmol/L — ABNORMAL LOW (ref 101–111)
GFR calc non Af Amer: 51 mL/min — ABNORMAL LOW (ref >60)
GFR, EST AFRICAN AMERICAN: 60 mL/min — AB (ref >60)
Glucose, Bld: 165 mg/dL — ABNORMAL HIGH (ref 65–99)
Potassium: 3.3 mmol/L — ABNORMAL LOW (ref 3.5–5.1)
Sodium: 138 mmol/L (ref 135–145)
TOTAL PROTEIN: 7.3 g/dL (ref 6.5–8.1)

## 2018-04-20 LAB — BRAIN NATRIURETIC PEPTIDE: B NATRIURETIC PEPTIDE 5: 241 pg/mL — AB (ref 0.0–100.0)

## 2018-04-20 LAB — D-DIMER, QUANTITATIVE (NOT AT ARMC): D DIMER QUANT: 0.42 ug{FEU}/mL (ref 0.00–0.50)

## 2018-06-11 ENCOUNTER — Other Ambulatory Visit: Payer: Self-pay | Admitting: Cardiology

## 2018-06-11 NOTE — Telephone Encounter (Signed)
Pt needs an appointment for further refills  

## 2018-09-03 ENCOUNTER — Emergency Department (HOSPITAL_COMMUNITY): Payer: Medicare Other

## 2018-09-03 ENCOUNTER — Encounter (HOSPITAL_COMMUNITY): Payer: Self-pay | Admitting: Emergency Medicine

## 2018-09-03 ENCOUNTER — Inpatient Hospital Stay (HOSPITAL_COMMUNITY)
Admission: EM | Admit: 2018-09-03 | Discharge: 2018-09-07 | DRG: 638 | Disposition: A | Discharge status: Home / Self Care | Payer: Medicare Other | Attending: Internal Medicine | Admitting: Internal Medicine

## 2018-09-03 ENCOUNTER — Other Ambulatory Visit: Payer: Self-pay

## 2018-09-03 DIAGNOSIS — L03115 Cellulitis of right lower limb: Secondary | ICD-10-CM | POA: Diagnosis present

## 2018-09-03 DIAGNOSIS — I6529 Occlusion and stenosis of unspecified carotid artery: Secondary | ICD-10-CM | POA: Diagnosis present

## 2018-09-03 DIAGNOSIS — I10 Essential (primary) hypertension: Secondary | ICD-10-CM | POA: Diagnosis present

## 2018-09-03 DIAGNOSIS — K589 Irritable bowel syndrome without diarrhea: Secondary | ICD-10-CM | POA: Diagnosis present

## 2018-09-03 DIAGNOSIS — Z961 Presence of intraocular lens: Secondary | ICD-10-CM | POA: Diagnosis present

## 2018-09-03 DIAGNOSIS — I5189 Other ill-defined heart diseases: Secondary | ICD-10-CM | POA: Diagnosis present

## 2018-09-03 DIAGNOSIS — E1159 Type 2 diabetes mellitus with other circulatory complications: Secondary | ICD-10-CM | POA: Diagnosis present

## 2018-09-03 DIAGNOSIS — Z833 Family history of diabetes mellitus: Secondary | ICD-10-CM | POA: Diagnosis not present

## 2018-09-03 DIAGNOSIS — I480 Paroxysmal atrial fibrillation: Secondary | ICD-10-CM | POA: Diagnosis present

## 2018-09-03 DIAGNOSIS — Z794 Long term (current) use of insulin: Secondary | ICD-10-CM

## 2018-09-03 DIAGNOSIS — Z9842 Cataract extraction status, left eye: Secondary | ICD-10-CM | POA: Diagnosis not present

## 2018-09-03 DIAGNOSIS — L97519 Non-pressure chronic ulcer of other part of right foot with unspecified severity: Secondary | ICD-10-CM | POA: Diagnosis present

## 2018-09-03 DIAGNOSIS — Z23 Encounter for immunization: Secondary | ICD-10-CM | POA: Diagnosis not present

## 2018-09-03 DIAGNOSIS — Z7901 Long term (current) use of anticoagulants: Secondary | ICD-10-CM

## 2018-09-03 DIAGNOSIS — K219 Gastro-esophageal reflux disease without esophagitis: Secondary | ICD-10-CM | POA: Diagnosis present

## 2018-09-03 DIAGNOSIS — E1165 Type 2 diabetes mellitus with hyperglycemia: Secondary | ICD-10-CM | POA: Diagnosis present

## 2018-09-03 DIAGNOSIS — Z79899 Other long term (current) drug therapy: Secondary | ICD-10-CM

## 2018-09-03 DIAGNOSIS — E11621 Type 2 diabetes mellitus with foot ulcer: Secondary | ICD-10-CM | POA: Diagnosis present

## 2018-09-03 DIAGNOSIS — E785 Hyperlipidemia, unspecified: Secondary | ICD-10-CM | POA: Diagnosis present

## 2018-09-03 DIAGNOSIS — I519 Heart disease, unspecified: Secondary | ICD-10-CM | POA: Diagnosis present

## 2018-09-03 DIAGNOSIS — Z8673 Personal history of transient ischemic attack (TIA), and cerebral infarction without residual deficits: Secondary | ICD-10-CM

## 2018-09-03 HISTORY — DX: Other ill-defined heart diseases: I51.89

## 2018-09-03 HISTORY — DX: Heart disease, unspecified: I51.9

## 2018-09-03 LAB — CBC WITH DIFFERENTIAL/PLATELET
BASOS ABS: 0 10*3/uL (ref 0.0–0.1)
BASOS PCT: 0 %
EOS ABS: 0 10*3/uL (ref 0.0–0.7)
EOS PCT: 0 %
HCT: 37.9 % (ref 36.0–46.0)
Hemoglobin: 12.5 g/dL (ref 12.0–15.0)
Lymphocytes Relative: 23 %
Lymphs Abs: 1.6 10*3/uL (ref 0.7–4.0)
MCH: 30 pg (ref 26.0–34.0)
MCHC: 33 g/dL (ref 30.0–36.0)
MCV: 90.9 fL (ref 78.0–100.0)
MONO ABS: 0.5 10*3/uL (ref 0.1–1.0)
Monocytes Relative: 7 %
Neutro Abs: 4.9 10*3/uL (ref 1.7–7.7)
Neutrophils Relative %: 70 %
PLATELETS: 211 10*3/uL (ref 150–400)
RBC: 4.17 MIL/uL (ref 3.87–5.11)
RDW: 13.4 % (ref 11.5–15.5)
WBC: 7 10*3/uL (ref 4.0–10.5)

## 2018-09-03 LAB — COMPREHENSIVE METABOLIC PANEL
ALBUMIN: 4 g/dL (ref 3.5–5.0)
ALK PHOS: 59 U/L (ref 38–126)
ALT: 9 U/L (ref 0–44)
ANION GAP: 11 (ref 5–15)
AST: 15 U/L (ref 15–41)
BUN: 23 mg/dL (ref 8–23)
CALCIUM: 9.2 mg/dL (ref 8.9–10.3)
CO2: 25 mmol/L (ref 22–32)
Chloride: 101 mmol/L (ref 98–111)
Creatinine, Ser: 0.99 mg/dL (ref 0.44–1.00)
GFR calc Af Amer: >60 mL/min (ref >60)
GFR calc non Af Amer: 54 mL/min — ABNORMAL LOW (ref >60)
GLUCOSE: 165 mg/dL — AB (ref 70–99)
Potassium: 3.7 mmol/L (ref 3.5–5.1)
Sodium: 137 mmol/L (ref 135–145)
TOTAL PROTEIN: 7.8 g/dL (ref 6.5–8.1)
Total Bilirubin: 1 mg/dL (ref 0.3–1.2)

## 2018-09-03 LAB — LACTIC ACID, PLASMA
LACTIC ACID, VENOUS: 1.9 mmol/L (ref 0.5–1.9)
LACTIC ACID, VENOUS: 1.3 mmol/L (ref 0.5–1.9)

## 2018-09-03 LAB — SEDIMENTATION RATE: Sed Rate: 36 mm/hr — ABNORMAL HIGH (ref 0–22)

## 2018-09-03 LAB — PHOSPHORUS: PHOSPHORUS: 3.2 mg/dL (ref 2.5–4.6)

## 2018-09-03 LAB — MAGNESIUM: Magnesium: 1.3 mg/dL — ABNORMAL LOW (ref 1.7–2.4)

## 2018-09-03 LAB — GLUCOSE, CAPILLARY: Glucose-Capillary: 161 mg/dL — ABNORMAL HIGH (ref 70–99)

## 2018-09-03 MED ORDER — ACETAMINOPHEN 325 MG PO TABS
650.0000 mg | ORAL_TABLET | Freq: Four times a day (QID) | ORAL | Status: DC | PRN
  Administered 2018-09-03: 650 mg via ORAL
  Filled 2018-09-03: qty 2

## 2018-09-03 MED ORDER — PIPERACILLIN-TAZOBACTAM 3.375 G IVPB 30 MIN
3.3750 g | Freq: Once | INTRAVENOUS | Status: AC
  Administered 2018-09-03: 3.375 g via INTRAVENOUS
  Filled 2018-09-03: qty 50

## 2018-09-03 MED ORDER — METRONIDAZOLE IN NACL 5-0.79 MG/ML-% IV SOLN
500.0000 mg | Freq: Three times a day (TID) | INTRAVENOUS | Status: DC
  Administered 2018-09-03 – 2018-09-05 (×6): 500 mg via INTRAVENOUS
  Filled 2018-09-03 (×6): qty 100

## 2018-09-03 MED ORDER — METFORMIN HCL 500 MG PO TABS
500.0000 mg | ORAL_TABLET | Freq: Two times a day (BID) | ORAL | Status: DC
  Administered 2018-09-04 – 2018-09-07 (×7): 500 mg via ORAL
  Filled 2018-09-03 (×7): qty 1

## 2018-09-03 MED ORDER — SODIUM CHLORIDE 0.9 % IV SOLN
2.0000 g | INTRAVENOUS | Status: DC
  Administered 2018-09-03 – 2018-09-05 (×3): 2 g via INTRAVENOUS
  Filled 2018-09-03 (×2): qty 2
  Filled 2018-09-03 (×3): qty 20

## 2018-09-03 MED ORDER — TETANUS-DIPHTH-ACELL PERTUSSIS 5-2.5-18.5 LF-MCG/0.5 IM SUSP
0.5000 mL | Freq: Once | INTRAMUSCULAR | Status: AC
  Administered 2018-09-03: 0.5 mL via INTRAMUSCULAR
  Filled 2018-09-03: qty 0.5

## 2018-09-03 MED ORDER — METOPROLOL TARTRATE 25 MG PO TABS
12.5000 mg | ORAL_TABLET | Freq: Two times a day (BID) | ORAL | Status: DC
  Administered 2018-09-04 – 2018-09-07 (×7): 12.5 mg via ORAL
  Filled 2018-09-03 (×7): qty 1

## 2018-09-03 MED ORDER — ONDANSETRON HCL 4 MG PO TABS
4.0000 mg | ORAL_TABLET | Freq: Four times a day (QID) | ORAL | Status: DC | PRN
  Administered 2018-09-06: 4 mg via ORAL
  Filled 2018-09-03: qty 1

## 2018-09-03 MED ORDER — INSULIN ASPART 100 UNIT/ML ~~LOC~~ SOLN
0.0000 [IU] | Freq: Three times a day (TID) | SUBCUTANEOUS | Status: DC
  Administered 2018-09-04 (×2): 1 [IU] via SUBCUTANEOUS
  Administered 2018-09-06 – 2018-09-07 (×2): 2 [IU] via SUBCUTANEOUS

## 2018-09-03 MED ORDER — VANCOMYCIN HCL IN DEXTROSE 750-5 MG/150ML-% IV SOLN
750.0000 mg | INTRAVENOUS | Status: DC
  Administered 2018-09-04 – 2018-09-05 (×2): 750 mg via INTRAVENOUS
  Filled 2018-09-03 (×3): qty 150

## 2018-09-03 MED ORDER — INFLUENZA VAC SPLIT HIGH-DOSE 0.5 ML IM SUSY
0.5000 mL | PREFILLED_SYRINGE | INTRAMUSCULAR | Status: AC
  Administered 2018-09-04: 0.5 mL via INTRAMUSCULAR
  Filled 2018-09-03: qty 0.5

## 2018-09-03 MED ORDER — HYDROCHLOROTHIAZIDE 12.5 MG PO CAPS
12.5000 mg | ORAL_CAPSULE | Freq: Every day | ORAL | Status: DC
  Administered 2018-09-04 – 2018-09-07 (×4): 12.5 mg via ORAL
  Filled 2018-09-03 (×4): qty 1

## 2018-09-03 MED ORDER — LISINOPRIL-HYDROCHLOROTHIAZIDE 20-12.5 MG PO TABS: 1.0000 | ORAL_TABLET | Freq: Every day | ORAL | Status: DC

## 2018-09-03 MED ORDER — SODIUM CHLORIDE 0.45 % IV SOLN
INTRAVENOUS | Status: DC
  Administered 2018-09-03 – 2018-09-06 (×5): via INTRAVENOUS

## 2018-09-03 MED ORDER — APIXABAN 5 MG PO TABS
5.0000 mg | ORAL_TABLET | Freq: Two times a day (BID) | ORAL | Status: DC
  Administered 2018-09-04 – 2018-09-07 (×7): 5 mg via ORAL
  Filled 2018-09-03 (×7): qty 1

## 2018-09-03 MED ORDER — ACETAMINOPHEN 650 MG RE SUPP: 650.0000 mg | Freq: Four times a day (QID) | RECTAL | Status: DC | PRN

## 2018-09-03 MED ORDER — MAGNESIUM SULFATE 4 GM/100ML IV SOLN
4.0000 g | Freq: Once | INTRAVENOUS | Status: AC
  Administered 2018-09-04: 4 g via INTRAVENOUS
  Filled 2018-09-03: qty 100

## 2018-09-03 MED ORDER — ATORVASTATIN CALCIUM 20 MG PO TABS
20.0000 mg | ORAL_TABLET | Freq: Every day | ORAL | Status: DC
  Administered 2018-09-03 – 2018-09-06 (×4): 20 mg via ORAL
  Filled 2018-09-03 (×4): qty 1

## 2018-09-03 MED ORDER — LISINOPRIL 10 MG PO TABS
20.0000 mg | ORAL_TABLET | Freq: Every day | ORAL | Status: DC
  Administered 2018-09-04 – 2018-09-07 (×4): 20 mg via ORAL
  Filled 2018-09-03 (×4): qty 2

## 2018-09-03 MED ORDER — DILTIAZEM HCL ER COATED BEADS 120 MG PO CP24
120.0000 mg | ORAL_CAPSULE | Freq: Every day | ORAL | Status: DC
  Administered 2018-09-04 – 2018-09-07 (×4): 120 mg via ORAL
  Filled 2018-09-03 (×4): qty 1

## 2018-09-03 MED ORDER — VANCOMYCIN HCL IN DEXTROSE 1-5 GM/200ML-% IV SOLN
1000.0000 mg | Freq: Once | INTRAVENOUS | Status: AC
  Administered 2018-09-03: 1000 mg via INTRAVENOUS
  Filled 2018-09-03: qty 200

## 2018-09-03 MED ORDER — ONDANSETRON HCL 4 MG/2ML IJ SOLN: 4.0000 mg | Freq: Four times a day (QID) | INTRAMUSCULAR | Status: DC | PRN

## 2018-09-03 NOTE — ED Provider Notes (Signed)
[facility] EMERGENCY DEPARTMENT Provider Note   CSN: [id number] Arrival date & time: 09/03/18  1704     History   Chief Complaint Chief Complaint  Patient presents with  . Wound Check    HPI Holly Sparks is a 77 y.o. female.  HPI With painful reddened right foot and ankle for the past several weeks.  Pain is worse with weightbearing and improved with nonweightbearing.  Been treated with multiple antibiotics, without relief.  She denies fever denies vomiting no other associated symptoms. Past Medical History:  Diagnosis Date  . Diabetes (HCC)    Type 2 x 10 yrs  . Dysrhythmia    AFib  . GERD (gastroesophageal reflux disease)   . High cholesterol   . Hypertension    x 10 yrs  . Paroxysmal a-fib: Remote hx of afib 06/15/2015  . Stroke [facility])    no deficits    Patient Active Problem List   Diagnosis Date Noted  . Paroxysmal atrial fibrillation (HCC) 08/31/2015  . Chronic anticoagulation 08/31/2015  . Type 2 diabetes mellitus with circulatory disorder (HCC) 08/31/2015  . 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 Paroxysmal a-fib: Remote hx of afib 06/15/2015  . Cerebral infarction due to embolism of left middle cerebral artery (HCC)   . Essential hypertension   . Stroke-like symptoms 06/13/2015  . HLD (hyperlipidemia) 06/13/2015  . DDD (degenerative disc disease), cervical 06/13/2015  . Elevated serum creatinine 06/13/2015  . Aphasia 06/13/2015  . Expressive aphasia   . Cerebral infarction due to unspecified mechanism   . Type 2 diabetes mellitus without complication (HCC)   . IBS (irritable bowel syndrome) 04/24/2013  . High cholesterol 04/24/2013  . Diabetes (HCC) 04/24/2013  . Essential hypertension, benign 04/24/2013    Past Surgical History:  Procedure Laterality Date  . CATARACT EXTRACTION W/PHACO Left 06/06/2016   Procedure: CATARACT EXTRACTION PHACO AND INTRAOCULAR LENS PLACEMENT (IOC);  Surgeon: Gemma Payor, MD;  Location: [facility] ORS;   Service: Ophthalmology;  Laterality: Left;  CDE: 8.32  . COLONOSCOPY    . COLONOSCOPY N/A 05/08/2013   Procedure: COLONOSCOPY;  Surgeon: Malissa Hippo, MD;  Location: [facility] ENDO SUITE;  Service: Endoscopy;  Laterality: N/A;  1015  . NECK SURGERY     x 2 for spur and disc problems     OB History   None      Home Medications    Prior to Admission medications   Medication Sig Start Date End Date Taking? Authorizing Provider  apixaban (ELIQUIS) 5 MG TABS tablet Take 1 tablet (5 mg total) by mouth 2 (two) times daily. 06/26/15  Yes Angiulli, Mcarthur Rossetti, PA-C  atorvastatin (LIPITOR) 20 MG tablet Take 1 tablet (20 mg total) by mouth at bedtime. 06/26/15   Angiulli, Mcarthur Rossetti, PA-C  cefUROXime (CEFTIN) 500 MG tablet Take 500 mg by mouth 2 (two) times daily. 08/29/18   [provider]  clindamycin (CLEOCIN) 300 MG capsule Take 300 mg by mouth 2 (two) times daily. 08/24/18   [provider]  diltiazem (CARDIZEM CD) 120 MG 24 hr capsule Take 1 capsule (120 mg total) by mouth daily. 01/21/18   Bethann Berkshire, MD  doxycycline (VIBRA-TABS) 100 MG tablet Take 100 mg by mouth 2 (two) times daily. 08/31/18   [provider]  lisinopril-hydrochlorothiazide (PRINZIDE,ZESTORETIC) 20-12.5 MG tablet Take 1 tablet by mouth daily. 07/02/18   [provider]  lisinopril-hydrochlorothiazide (PRINZIDE,ZESTORETIC) 20-25 MG per tablet Take 1 tablet by mouth daily. For high blood  pressure Patient taking differently: Take by mouth daily. For high blood pressure  1/2 tabl daily. 06/26/15   Angiulli, Mcarthur Rossetti, PA-C  metFORMIN (GLUCOPHAGE) 1000 MG tablet Take 500 mg by mouth 2 (two) times daily with a meal.    [provider]  metoprolol tartrate (LOPRESSOR) 25 MG tablet TAKE ONE-HALF TABLET BY MOUTH TWICE A DAY 06/11/18   Antoine Poche, MD    Family History History reviewed. No pertinent family history.  Social History Social History   Tobacco Use  . Smoking status: Never  Smoker  . Smokeless tobacco: Never Used  Substance Use Topics  . Alcohol use: No    Alcohol/week: 0.0 standard drinks  . Drug use: No     Allergies   Patient has no known allergies.   Review of Systems Review of Systems  Constitutional: Negative.   HENT: Negative.   Respiratory: Negative.   Cardiovascular: Negative.   Gastrointestinal: Negative.   Musculoskeletal: Positive for arthralgias.       Right foot and ankle pain  Skin: Positive for color change.       Reddened right lower extremity  Allergic/Immunologic: Positive for immunocompromised state.       Uncertain when she last had tetanus immunization diabetic  Neurological: Negative.   Psychiatric/Behavioral: Negative.   All other systems reviewed and are negative.    Physical Exam Updated Vital Signs BP 124/78 (BP Location: Left Arm)   Pulse 92   Temp 97.6 F (36.4 C) (Oral)   Resp 18   Ht 5' (1.524 m)   Wt 59 kg   SpO2 97%   BMI 25.39 kg/m   Physical Exam  Constitutional:  Frail and chronically ill-appearing  HENT:  Head: Normocephalic and atraumatic.  Eyes: Pupils are equal, round, and reactive to light. Conjunctivae are normal.  Neck: Neck supple. No tracheal deviation present. No thyromegaly present.  Cardiovascular: Normal rate and regular rhythm.  No murmur heard. Pulmonary/Chest: Effort normal and breath sounds normal.  Abdominal: Soft. Bowel sounds are normal. She exhibits no distension. There is no tenderness.  Musculoskeletal: Normal range of motion. She exhibits no edema or tenderness.  Right lower extremity there are clear fluid-filled blisters over the dorsal aspect of the great toe and at the posterior aspect of the heel.  There are open wounds over the dorsum of the foot and overlying the medial malleolus.  There is redness extending along the distal one third of the shin.  Foot is grossly swollen,, and reddened.  DP pulse 2+.  Good capillary refill.  She moves her toes well.  All other  extremities are redness swelling or tenderness neurovascularly intact  Neurological: She is alert. Coordination normal.  Skin: Skin is warm and dry. No rash noted.  Psychiatric: She has a normal mood and affect.  Nursing note and vitals reviewed.    ED Treatments / Results  Labs (all labs ordered are listed, but only abnormal results are displayed) Labs Reviewed  CULTURE, BLOOD (ROUTINE X 2)  CULTURE, BLOOD (ROUTINE X 2)  COMPREHENSIVE METABOLIC PANEL  CBC WITH DIFFERENTIAL/PLATELET  LACTIC ACID, PLASMA  LACTIC ACID, PLASMA   Results for orders placed or performed during the hospital encounter of 09/03/18  Blood culture (routine x 2)  Result Value Ref Range   Specimen Description BLOOD RIGHT FOREARM    Special Requests      BOTTLES DRAWN AEROBIC AND ANAEROBIC Blood Culture adequate volume Performed at [facility], [address] [place], [place] [address]  Culture PENDING    Report Status PENDING   Blood culture (routine x 2)  Result Value Ref Range   Specimen Description LEFT ANTECUBITAL    Special Requests      BOTTLES DRAWN AEROBIC AND ANAEROBIC Blood Culture adequate volume Performed at [facility], [address] [place], [place] [address]    Culture PENDING    Report Status PENDING   Comprehensive metabolic panel  Result Value Ref Range   Sodium 137 135 - 145 mmol/L   Potassium 3.7 3.5 - 5.1 mmol/L   Chloride 101 98 - 111 mmol/L   CO2 25 22 - 32 mmol/L   Glucose, Bld 165 (H) 70 - 99 mg/dL   BUN 23 8 - 23 mg/dL   Creatinine, Ser 6.04 0.44 - 1.00 mg/dL   Calcium 9.2 8.9 - 54.0 mg/dL   Total Protein 7.8 6.5 - 8.1 g/dL   Albumin 4.0 3.5 - 5.0 g/dL   AST 15 15 - 41 U/L   ALT 9 0 - 44 U/L   Alkaline Phosphatase 59 38 - 126 U/L   Total Bilirubin 1.0 0.3 - 1.2 mg/dL   GFR calc non Af Amer 54 (L) >60 mL/min   GFR calc Af Amer >60 >60 mL/min   Anion gap 11 5 - 15  CBC with Differential  Result Value Ref Range   WBC 7.0 4.0 - 10.5 K/uL   RBC 4.17 3.87 - 5.11  MIL/uL   Hemoglobin 12.5 12.0 - 15.0 g/dL   HCT 98.1 19.1 - 47.8 %   MCV 90.9 78.0 - 100.0 fL   MCH 30.0 26.0 - 34.0 pg   MCHC 33.0 30.0 - 36.0 g/dL   RDW 29.5 62.1 - 30.8 %   Platelets 211 150 - 400 K/uL   Neutrophils Relative % 70 %   Neutro Abs 4.9 1.7 - 7.7 K/uL   Lymphocytes Relative 23 %   Lymphs Abs 1.6 0.7 - 4.0 K/uL   Monocytes Relative 7 %   Monocytes Absolute 0.5 0.1 - 1.0 K/uL   Eosinophils Relative 0 %   Eosinophils Absolute 0.0 0.0 - 0.7 K/uL   Basophils Relative 0 %   Basophils Absolute 0.0 0.0 - 0.1 K/uL  Lactic acid, plasma  Result Value Ref Range   Lactic Acid, Venous 1.9 0.5 - 1.9 mmol/L   Dg Chest 2 View  Result Date: 09/03/2018 CLINICAL DATA:  Foot wound for several weeks with drainage, initial encounter EXAM: CHEST - 2 VIEW COMPARISON:  01/21/2018 FINDINGS: Cardiac shadows within normal limits. The lungs are well aerated bilaterally. No focal infiltrate or sizable effusion is seen. No acute bony abnormality is noted. Degenerative changes of the thoracic spine are noted. IMPRESSION: No active cardiopulmonary disease. Electronically Signed   By: Alcide Clever M.D.   On: 09/03/2018 18:45   Dg Ankle Complete Right  Result Date: 09/03/2018 CLINICAL DATA:  Foot and ankle pain and redness.  Blisters. EXAM: RIGHT ANKLE - COMPLETE 3+ VIEW COMPARISON:  None. FINDINGS: No fracture or bone lesion. No bone resorption to suggest osteomyelitis. Ankle joint normally spaced and aligned.  No arthropathic changes. Moderate plantar calcaneal spur. Mild diffuse soft tissue swelling. IMPRESSION: No fracture, ankle joint abnormality or evidence of osteomyelitis. Electronically Signed   By: Amie Portland M.D.   On: 09/03/2018 20:56   Dg Foot Complete Right  Result Date: 09/03/2018 CLINICAL DATA:  Blisters at Right big toe, right heel, across top of right foot, some blisters have burst, wounds have  been on right foot for weeks, pain EXAM: RIGHT FOOT COMPLETE - 3+ VIEW COMPARISON:  None.  FINDINGS: No fracture. No bone lesion. There are no areas of bone resorption to suggest osteomyelitis. Mild first metatarsophalangeal joint osteoarthritis. Remaining joints normally spaced and aligned. Moderate plantar calcaneal spur. Mild dorsal soft tissue swelling. IMPRESSION: No fracture or dislocation.  No evidence of osteomyelitis. Electronically Signed   By: Amie Portland M.D.   On: 09/03/2018 20:55   EKG None  Radiology Dg Chest 2 View  Result Date: 09/03/2018 CLINICAL DATA:  Foot wound for several weeks with drainage, initial encounter EXAM: CHEST - 2 VIEW COMPARISON:  01/21/2018 FINDINGS: Cardiac shadows within normal limits. The lungs are well aerated bilaterally. No focal infiltrate or sizable effusion is seen. No acute bony abnormality is noted. Degenerative changes of the thoracic spine are noted. IMPRESSION: No active cardiopulmonary disease. Electronically Signed   By: Alcide Clever M.D.   On: 09/03/2018 18:45    Procedures Procedures (including critical care time)  Medications Ordered in ED Medications  Tdap (BOOSTRIX) injection 0.5 mL (has no administration in time range)     Initial Impression / Assessment and Plan / ED Course  I have reviewed the triage vital signs and the nursing notes.  Pertinent labs & imaging results that were available during my care of the patient were reviewed by me and considered in my medical decision making (see chart for details).     920P.m. patient resting comfortably after treatment with intravenous antibiotics. Lab work remarkable for mild hyperglycemia otherwise normal Dr. Robb Matar consulted and will arrange for overnight stay. Suspect that patient has cellulitis with a sending lymphangitis and possible osteomyelitis Final Clinical Impressions(s) / ED Diagnoses  Diagnosis #1infection of right foot and ankle Final diagnoses:  None  #2 hyperglycemia  ED Discharge Orders    None       Doug Sou, MD 09/03/18 2131

## 2018-09-03 NOTE — ED Notes (Signed)
ED TO INPATIENT HANDOFF REPORT  Name/Age/Gender Holly Sparks 77 y.o. female  Code Status    Code Status Orders  (From admission, onward)         Start     Ordered   09/03/18 2127  Full code  Continuous     09/03/18 2128        Code Status History    Date Active Date Inactive Code Status Order ID Comments User Context   06/16/2015 1719 06/27/2015 1333 Full Code [id number]  Elizabeth Sauer Inpatient   06/13/2015 1447 06/16/2015 1719 Full Code [id number]  Karen Kitchens Inpatient      Home/SNF/Other Home  Chief Complaint right foot swollen,blisters  Level of Care/Admitting Diagnosis ED Disposition    ED Disposition Condition [place]: [facility] [[id number]]  Level of Care: Med-Surg [16]  Diagnosis: 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 Diabetes ([place])    Type 2 x 10 yrs  . Dysrhythmia    AFib  . GERD (gastroesophageal reflux disease)   . High cholesterol   . Hypertension    x 10 yrs  . Paroxysmal a-fib: Remote hx of afib 06/15/2015  . Stroke [facility])    no deficits    Allergies No Known Allergies  IV Location/Drains/Wounds Patient Lines/Drains/Airways Status   Active Line/Drains/Airways    Name:   Placement date:   Placement time:   Site:   Days:   Peripheral IV 09/03/18 Right Forearm   09/03/18    1937    Forearm   less than 1   Incision (Closed) 06/06/16 Eye Left   06/06/16    1014     819          Labs/Imaging Results for orders placed or performed during the hospital  encounter of 09/03/18 (from the past 48 hour(s))  Comprehensive metabolic panel     Status: Abnormal   Collection Time: 09/03/18  7:37 PM  Result Value Ref Range   Sodium 137 135 - 145 mmol/L   Potassium 3.7 3.5 - 5.1 mmol/L   Chloride 101 98 - 111 mmol/L   CO2 25 22 - 32 mmol/L   Glucose, Bld 165 (H) 70 - 99 mg/dL   BUN 23 8 - 23 mg/dL   Creatinine, Ser 0.99 0.44 - 1.00 mg/dL   Calcium 9.2 8.9 - 10.3 mg/dL   Total Protein 7.8 6.5 - 8.1 g/dL   Albumin 4.0 3.5 - 5.0 g/dL   AST 15 15 - 41 U/L   ALT 9 0 - 44 U/L   Alkaline Phosphatase 59 38 - 126 U/L   Total Bilirubin 1.0 0.3 - 1.2 mg/dL   GFR calc non Af Amer 54 (L) >60 mL/min   GFR calc Af Amer >60 >60 mL/min    Comment: (NOTE) The eGFR has been calculated using the CKD EPI equation. This calculation has not been validated in all clinical situations. eGFR's persistently <60 mL/min signify possible Chronic Kidney Disease.    Anion gap 11 5 -  15    Comment: Performed at [facility], [address], [place], [place] [address]  CBC with Differential     Status: None   Collection Time: 09/03/18  7:37 PM  Result Value Ref Range   WBC 7.0 4.0 - 10.5 K/uL   RBC 4.17 3.87 - 5.11 MIL/uL   Hemoglobin 12.5 12.0 - 15.0 g/dL   HCT 37.9 36.0 - 46.0 %   MCV 90.9 78.0 - 100.0 fL   MCH 30.0 26.0 - 34.0 pg   MCHC 33.0 30.0 - 36.0 g/dL   RDW 13.4 11.5 - 15.5 %   Platelets 211 150 - 400 K/uL   Neutrophils Relative % 70 %   Neutro Abs 4.9 1.7 - 7.7 K/uL   Lymphocytes Relative 23 %   Lymphs Abs 1.6 0.7 - 4.0 K/uL   Monocytes Relative 7 %   Monocytes Absolute 0.5 0.1 - 1.0 K/uL   Eosinophils Relative 0 %   Eosinophils Absolute 0.0 0.0 - 0.7 K/uL   Basophils Relative 0 %   Basophils Absolute 0.0 0.0 - 0.1 K/uL    Comment: Performed at [facility], [address] [place], [place] [address]  Lactic acid, plasma     Status: None   Collection Time: 09/03/18  7:37 PM  Result Value Ref Range   Lactic Acid, Venous 1.9 0.5 - 1.9 mmol/L     Comment: Performed at [facility], [address] [place], [place] [address]  Blood culture (routine x 2)     Status: None (Preliminary result)   Collection Time: 09/03/18  7:38 PM  Result Value Ref Range   Specimen Description BLOOD RIGHT FOREARM    Special Requests      BOTTLES DRAWN AEROBIC AND ANAEROBIC Blood Culture adequate volume Performed at [facility], [address] [place], [place] [address]    Culture PENDING    Report Status PENDING   Blood culture (routine x 2)     Status: None (Preliminary result)   Collection Time: 09/03/18  7:38 PM  Result Value Ref Range   Specimen Description LEFT ANTECUBITAL    Special Requests      BOTTLES DRAWN AEROBIC AND ANAEROBIC Blood Culture adequate volume Performed at [facility], [address], [place], [place] [address]    Culture PENDING    Report Status PENDING    Dg Chest 2 View  Result Date: 09/03/2018 CLINICAL DATA:  Foot wound for several weeks with drainage, initial encounter EXAM: CHEST - 2 VIEW COMPARISON:  01/21/2018 FINDINGS: Cardiac shadows within normal limits. The lungs are well aerated bilaterally. No focal infiltrate or sizable effusion is seen. No acute bony abnormality is noted. Degenerative changes of the thoracic spine are noted. IMPRESSION: No active cardiopulmonary disease. Electronically Signed   By: Inez Catalina M.D.   On: 09/03/2018 18:45   Dg Ankle Complete Right  Result Date: 09/03/2018 CLINICAL DATA:  Foot and ankle pain and redness.  Blisters. EXAM: RIGHT ANKLE - COMPLETE 3+ VIEW COMPARISON:  None. FINDINGS: No fracture or bone lesion. No bone resorption to suggest osteomyelitis. Ankle joint normally spaced and aligned.  No arthropathic changes. Moderate plantar calcaneal spur. Mild diffuse soft tissue swelling. IMPRESSION: No fracture, ankle joint abnormality or evidence of osteomyelitis. Electronically Signed   By: Lajean Manes M.D.   On: 09/03/2018 20:56   Dg Foot Complete Right  Result Date:  09/03/2018 CLINICAL DATA:  Blisters at Right big toe, right heel, across top of right foot, some blisters have  burst, wounds have been on right foot for weeks, pain EXAM: RIGHT FOOT COMPLETE - 3+ VIEW COMPARISON:  None. FINDINGS: No fracture. No bone lesion. There are no areas of bone resorption to suggest osteomyelitis. Mild first metatarsophalangeal joint osteoarthritis. Remaining joints normally spaced and aligned. Moderate plantar calcaneal spur. Mild dorsal soft tissue swelling. IMPRESSION: No fracture or dislocation.  No evidence of osteomyelitis. Electronically Signed   By: Lajean Manes M.D.   On: 09/03/2018 20:55    Pending Labs Unresulted Labs (From admission, onward)    Start     Ordered   09/07/18 7124  Basic metabolic panel  Every Mon-Wed-Fri (0500),   R     09/03/18 2139   09/04/18 0500  CBC WITH DIFFERENTIAL  Daily,   R     09/03/18 2128   09/04/18 5809  Basic metabolic panel  Daily,   R     09/03/18 2128   09/04/18 0500  HIV antibody  Tomorrow morning,   R     09/03/18 2131   09/03/18 2130  C-reactive protein  Add-on,   R     09/03/18 2131   09/03/18 2130  Prealbumin  Add-on,   R     09/03/18 2131   09/03/18 2129  Hemoglobin A1c  Add-on,   R     09/03/18 2131   09/03/18 2129  Sedimentation rate  Add-on,   R     09/03/18 2131   09/03/18 2126  Magnesium  Add-on,   R     09/03/18 2125   09/03/18 1801  Lactic acid, plasma  Now then every 2 hours,   STAT     09/03/18 1800          Vitals/Pain Today's Vitals   09/03/18 1720 09/03/18 2000 09/03/18 2030  BP: 124/78 (!) 146/81 (!) 117/101  Pulse: 92  68  Resp: 18    Temp: 97.6 F (36.4 C)    TempSrc: Oral    SpO2: 97%  98%  Weight: 59 kg    Height: 5' (1.524 m)    PainSc: 10-Worst pain ever      Isolation Precautions No active isolations  Medications Medications  vancomycin (VANCOCIN) IVPB 1000 mg/200 mL premix (1,000 mg Intravenous New Bag/Given 09/03/18 2042)  0.45 % sodium chloride infusion (has no  administration in time range)  ondansetron (ZOFRAN) tablet 4 mg (has no administration in time range)    Or  ondansetron (ZOFRAN) injection 4 mg (has no administration in time range)  acetaminophen (TYLENOL) tablet 650 mg (has no administration in time range)    Or  acetaminophen (TYLENOL) suppository 650 mg (has no administration in time range)  insulin aspart (novoLOG) injection 0-9 Units (has no administration in time range)  cefTRIAXone (ROCEPHIN) 2 g in sodium chloride 0.9 % 100 mL IVPB (has no administration in time range)  metroNIDAZOLE (FLAGYL) IVPB 500 mg (has no administration in time range)  vancomycin (VANCOCIN) IVPB 750 mg/150 ml premix (has no administration in time range)  Tdap (BOOSTRIX) injection 0.5 mL (0.5 mLs Intramuscular Given 09/03/18 2004)  piperacillin-tazobactam (ZOSYN) IVPB 3.375 g (0 g Intravenous Stopped 09/03/18 2041)    Mobility walks

## 2018-09-03 NOTE — Progress Notes (Signed)
Pharmacy Antibiotic Note  Holly Sparks is a 77 y.o. female admitted on 09/03/2018 with cellulitis.  Pharmacy has been consulted for Vancomycin dosing.  Plan: Vancomycin 750mg  IV every 24 hours.  Goal trough 10-15 mcg/mL.  Monitor labs, micro and vitals.   Height: 5' (152.4 cm) Weight: 130 lb (59 kg) IBW/kg (Calculated) : 45.5  Temp (24hrs), Avg:97.6 F (36.4 C), Min:97.6 F (36.4 C), Max:97.6 F (36.4 C)  Recent Labs  Lab 09/03/18 1937  WBC 7.0  CREATININE 0.99  LATICACIDVEN 1.9    Estimated Creatinine Clearance: 38.8 mL/min (by C-G formula based on SCr of 0.99 mg/dL).    No Known Allergies  Antimicrobials this admission: Vanc 10/7 >>  Rocephin 10/7 >>  Flagyl 10/7 >>  Dose adjustments this admission: n/a   Microbiology results: 10/7  BCx: pending  UCx:    Sputum:    MRSA PCR:   Thank you for allowing pharmacy to be a part of this patient's care.  Mady Gemma 09/03/2018 9:35 PM

## 2018-09-03 NOTE — H&P (Signed)
History and Physical    Holly Sparks CHE:[id number] DOB: November 12, 1941 DOA: 09/03/2018  PCP: Lemmie Evens, MD   Patient coming from: Home.  I have personally briefly reviewed patient's old medical records in [facility]  Chief Complaint: Right foot wound for several weeks.  HPI: Holly Sparks is a 77 y.o. 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 Leslie Andrea for evaluation of a large blistered right foot wound for over 2 weeks that has not responded to oral clindamycin, cefuroxime and doxycycline.  No trauma to the area to her knowledge.  However, her husband states that he so her sandals straps looking a little tight before these happen.  She complains of mild nausea, but denies emesis, fever, chills, night sweats or fatigue.  She has clear rhinorrhea for the past few days.  No sore throat, wheezing, hemoptysis, dyspnea.  She denies chest pain, palpitations, dizziness, diaphoresis, PND or orthopnea.  She denies abdominal pain, diarrhea, melena or hematochezia.  She gets frequent constipation.  She denies dysuria, frequency or hematuria.  She denies polyuria, polydipsia, polyphagia or blurred vision.  No heat or cold intolerance.  Although has some difficulty with recent recall, she denies memory trouble.  ED Course: Initial vital signs temperature 97.6 F, pulse 92, respirations 18, blood pressure 124/78 mmHg and O2 sat 97% on room air.  Patient was given vancomycin and Zosyn in the emergency department.  Her white count was 7.0 with a normal differential, hemoglobin 12.5 g/dL and platelets 211.  Her CMP showed a blood glucose was 165 mg/dL, all all other CMP values are within normal limits.  Lactic acid was normal.  Imaging: her chest radiograph was within normal limits.  Right foot and ankle x-ray  show mild diffuse soft tissue swelling, but no fracture, ankle joint abnormality or evidence of osteomyelitis.  Please see images and full radiology report for further detail.  Review of Systems: As per HPI otherwise 10 point review of systems negative.  Past Medical History:  Diagnosis Date  . Diabetes ([place])    Type 2 x 10 yrs  . Dysrhythmia    AFib  . GERD (gastroesophageal reflux disease)   . High cholesterol   . Hypertension    x 10 yrs  . Paroxysmal a-fib: Remote hx of afib 06/15/2015  . Stroke Holly Sparks)    no deficits    Past Surgical History:  Procedure Laterality Date  . CATARACT EXTRACTION W/PHACO Left 06/06/2016   Procedure: CATARACT EXTRACTION PHACO AND INTRAOCULAR LENS PLACEMENT (IOC);  Surgeon: Tonny Branch, MD;  Location: [facility];  Service: Ophthalmology;  Laterality: Left;  CDE: 8.32  . COLONOSCOPY    . COLONOSCOPY N/A 05/08/2013   Procedure: COLONOSCOPY;  Surgeon: Rogene Houston, MD;  Location: [facility] SUITE;  Service: Endoscopy;  Laterality: N/A;  1015  . NECK SURGERY     x 2 for spur and disc problems     reports that she has never smoked. She has never used smokeless tobacco. She reports that she does not drink alcohol or use drugs.  No Known Allergies  Family History  Problem Relation Age of Onset  . Diabetes Father    Prior to Admission medications   Medication Sig Start Date End Date Taking? Authorizing Provider  apixaban (ELIQUIS) 5 MG TABS tablet Take 1 tablet (5 mg  total) by mouth 2 (two) times daily. 06/26/15  Yes Angiulli, Lavon Paganini, PA-C  atorvastatin (LIPITOR) 20 MG tablet Take 1 tablet (20 mg total) by mouth at bedtime. 06/26/15   Angiulli, Lavon Paganini, PA-C  cefUROXime (CEFTIN) 500 MG tablet Take 500 mg by mouth 2 (two) times daily. 08/29/18   [provider]  clindamycin (CLEOCIN) 300 MG capsule Take 300 mg by mouth 2 (two) times daily. 08/24/18   [provider]  diltiazem (CARDIZEM CD) 120 MG 24 hr capsule Take 1 capsule (120 mg total) by  mouth daily. 01/21/18   Milton Ferguson, MD  doxycycline (VIBRA-TABS) 100 MG tablet Take 100 mg by mouth 2 (two) times daily. 08/31/18   [provider]  lisinopril-hydrochlorothiazide (PRINZIDE,ZESTORETIC) 20-12.5 MG tablet Take 1 tablet by mouth daily. 07/02/18   [provider]  lisinopril-hydrochlorothiazide (PRINZIDE,ZESTORETIC) 20-25 MG per tablet Take 1 tablet by mouth daily. For high blood pressure Patient taking differently: Take by mouth daily. For high blood pressure  1/2 tabl daily. 06/26/15   Angiulli, Lavon Paganini, PA-C  metFORMIN (GLUCOPHAGE) 1000 MG tablet Take 500 mg by mouth 2 (two) times daily with a meal.    [provider]  metoprolol tartrate (LOPRESSOR) 25 MG tablet TAKE ONE-HALF TABLET BY MOUTH TWICE A DAY 06/11/18   Arnoldo Lenis, MD    Physical Exam: Vitals:   09/03/18 2000 09/03/18 2030 09/03/18 2130 09/03/18 2200  BP: (!) 146/81 (!) 117/101 (!) 134/93 139/84  Pulse:  68    Resp:      Temp:      TempSrc:      SpO2:  98%    Weight:      Height:        Constitutional: NAD, calm, comfortable Eyes: PERRL, lids and conjunctivae normal ENMT: Mucous membranes are moist. Posterior pharynx clear of any exudate or lesions. Neck: normal, supple, no masses, no thyromegaly Respiratory: clear to auscultation bilaterally, no wheezing, no crackles. Normal respiratory effort. No accessory muscle use.  Cardiovascular: Regular rate and rhythm, no murmurs / rubs / gallops. No extremity edema. 1-2+ pedal pulses. No carotid bruits.  Abdomen: no tenderness, no masses palpated. No hepatosplenomegaly. Bowel sounds positive.  Musculoskeletal: no clubbing / cyanosis.  Good ROM, no contractures. Normal muscle tone.  Skin: Mild erythema on right foot arch.  Large blister on right great toe.  Large wound from a large, open tender bulla with surrounding erythema, calor and edema on dorsal aspect of right foot.  Crosstable lateral with some necrotic skin with surrounding  edema, erythema, calor and tenderness on right ankle area.  Please see pictures below for further detail. Neurologic: CN 2-12 grossly intact. Sensation intact, DTR normal. Strength 5/5 in all 4.  Psychiatric: Normal judgment and insight. Alert and oriented x 3.  Normal mood.             Labs on Admission: I have personally reviewed following labs and imaging studies  CBC: Recent Labs  Lab 09/03/18 1937  WBC 7.0  NEUTROABS 4.9  HGB 12.5  HCT 37.9  MCV 90.9  PLT 144   Basic Metabolic Panel: Recent Labs  Lab 09/03/18 1937 09/03/18 2143  NA 137  --   K 3.7  --   CL 101  --   CO2 25  --   GLUCOSE 165*  --   BUN 23  --   CREATININE 0.99  --   CALCIUM 9.2  --   MG  --  1.3*  GFR: Estimated Creatinine Clearance: 38.8 mL/min (by C-G formula based on SCr of 0.99 mg/dL). Liver Function Tests: Recent Labs  Lab 09/03/18 1937  AST 15  ALT 9  ALKPHOS 59  BILITOT 1.0  PROT 7.8  ALBUMIN 4.0   No results for input(s): LIPASE, AMYLASE in the last 168 hours. No results for input(s): AMMONIA in the last 168 hours. Coagulation Profile: No results for input(s): INR, PROTIME in the last 168 hours. Cardiac Enzymes: No results for input(s): CKTOTAL, CKMB, CKMBINDEX, TROPONINI in the last 168 hours. BNP (last 3 results) No results for input(s): PROBNP in the last 8760 hours. HbA1C: No results for input(s): HGBA1C in the last 72 hours. CBG: No results for input(s): GLUCAP in the last 168 hours. Lipid Profile: No results for input(s): CHOL, HDL, LDLCALC, TRIG, CHOLHDL, LDLDIRECT in the last 72 hours. Thyroid Function Tests: No results for input(s): TSH, T4TOTAL, FREET4, T3FREE, THYROIDAB in the last 72 hours. Anemia Panel: No results for input(s): VITAMINB12, FOLATE, FERRITIN, TIBC, IRON, RETICCTPCT in the last 72 hours. Urine analysis:    Component Value Date/Time   COLORURINE YELLOW 06/13/2015 1141   APPEARANCEUR CLEAR 06/13/2015 1141   LABSPEC 1.023 06/13/2015  1141   PHURINE 5.5 06/13/2015 1141   GLUCOSEU >1000 (A) 06/13/2015 1141   HGBUR NEGATIVE 06/13/2015 1141   BILIRUBINUR NEGATIVE 06/13/2015 1141   KETONESUR 15 (A) 06/13/2015 1141   PROTEINUR NEGATIVE 06/13/2015 1141   UROBILINOGEN 0.2 06/13/2015 1141   NITRITE NEGATIVE 06/13/2015 1141   LEUKOCYTESUR NEGATIVE 06/13/2015 1141    Radiological Exams on Admission: Dg Chest 2 View  Result Date: 09/03/2018 CLINICAL DATA:  Foot wound for several weeks with drainage, initial encounter EXAM: CHEST - 2 VIEW COMPARISON:  01/21/2018 FINDINGS: Cardiac shadows within normal limits. The lungs are well aerated bilaterally. No focal infiltrate or sizable effusion is seen. No acute bony abnormality is noted. Degenerative changes of the thoracic spine are noted. IMPRESSION: No active cardiopulmonary disease. Electronically Signed   By: Inez Catalina M.D.   On: 09/03/2018 18:45   Dg Ankle Complete Right  Result Date: 09/03/2018 CLINICAL DATA:  Foot and ankle pain and redness.  Blisters. EXAM: RIGHT ANKLE - COMPLETE 3+ VIEW COMPARISON:  None. FINDINGS: No fracture or bone lesion. No bone resorption to suggest osteomyelitis. Ankle joint normally spaced and aligned.  No arthropathic changes. Moderate plantar calcaneal spur. Mild diffuse soft tissue swelling. IMPRESSION: No fracture, ankle joint abnormality or evidence of osteomyelitis. Electronically Signed   By: Lajean Manes M.D.   On: 09/03/2018 20:56   Dg Foot Complete Right  Result Date: 09/03/2018 CLINICAL DATA:  Blisters at Right big toe, right heel, across top of right foot, some blisters have burst, wounds have been on right foot for weeks, pain EXAM: RIGHT FOOT COMPLETE - 3+ VIEW COMPARISON:  None. FINDINGS: No fracture. No bone lesion. There are no areas of bone resorption to suggest osteomyelitis. Mild first metatarsophalangeal joint osteoarthritis. Remaining joints normally spaced and aligned. Moderate plantar calcaneal spur. Mild dorsal soft tissue  swelling. IMPRESSION: No fracture or dislocation.  No evidence of osteomyelitis. Electronically Signed   By: Lajean Manes M.D.   On: 09/03/2018 20:55   07/17/ 2016 echocardiogram ------------------------------------------------------------------- LV EF: 60% -   65%  ------------------------------------------------------------------- Indications:      CVA 436.  ------------------------------------------------------------------- History:   PMH:  Irritable bowel syndrome.  Risk factors: Hypertension. Diabetes mellitus. Dyslipidemia.  ------------------------------------------------------------------- Study Conclusions  - Left ventricle: The cavity size was normal. There  was moderate   concentric hypertrophy. Systolic function was normal. The   estimated ejection fraction was in the range of 60% to 65%. Wall   motion was normal; there were no regional wall motion   abnormalities. There was an increased relative contribution of   atrial contraction to ventricular filling. Doppler parameters are   consistent with abnormal left ventricular relaxation (grade 1   diastolic dysfunction). - Mitral valve: There was trivial regurgitation  06/15/2015 carotid Doppler study  Impression: Bilateral: 1-39% ICA stenosis. Vertebral artery flow is antegrade.  EKG: Independently reviewed.   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 Continue local care (consult wound care). Check ESR, CRP and prealbumin. Ceftriaxone 2 g IVPB every 24 hours. Metronidazole 500 mg IVPB every 8 hours. Vancomycin per pharmacy. Check lower extremities arterial circulation with ultrasound. Check MRI to foot to rule out osteomyelitis.  Active Problems:   Type 2 diabetes mellitus with circulatory disorder (HCC) Carbohydrate modified diet. Continue metformin 500 mg p.o. twice daily. CBG monitoring with regular insulin  sliding scale. Check hemoglobin A1c.    Hypomagnesemia Replacing. Follow-up level as needed.    HLD (hyperlipidemia) Continue atorvastatin 20 mg p.o. daily. Monitor LFTs as needed. Fasting lipid profile follow-up as an outpatient.    Essential hypertension Continue Cardizem CD 120 mg p.o. daily. Continue lisinopril 20 mg p.o. daily. Continue hydrochlorothiazide 12.5 mg p.o. daily. Continue metoprolol 12.5 mg p.o. twice daily. Monitor blood pressure, heart rate, renal function electrolytes.    Paroxysmal atrial fibrillation (HCC) CHA?DS?-VASc Score of at least 9. Continue Eliquis anticoagulation. Continue diltiazem 120 mg p.o. daily. Continue metoprolol 12.5 mg p.o. twice daily.    Grade 1 diastolic dysfunction No signs of decompensation. Continue ACE inhibitor, HCTZ and beta-blocker.    DVT prophylaxis: On Eliquis. Code Status: Full code. Family Communication: Her husband was present in the ED room. Disposition Plan: Admit for IV antibiotic for several days and further work-up. Consults called: Wound care. Admission status: Inpatient/MedSurg.   Reubin Milan MD [facility] Pager [phone].  If 7PM-7AM, please contact night-coverage [email or web address] Password [facility]  09/03/2018, 10:30 PM   This document was prepared using Dragon voice recognition software and may contain some unintended transcription errors.

## 2018-09-03 NOTE — ED Triage Notes (Signed)
Pt reports a wound to right foot for several weeks.  States she was sent by Dr. [name] for evaluation.  Large reddened open blistered area with drainage to right ankle.

## 2018-09-04 ENCOUNTER — Inpatient Hospital Stay (HOSPITAL_COMMUNITY): Payer: Medicare Other

## 2018-09-04 DIAGNOSIS — I480 Paroxysmal atrial fibrillation: Secondary | ICD-10-CM

## 2018-09-04 DIAGNOSIS — E785 Hyperlipidemia, unspecified: Secondary | ICD-10-CM

## 2018-09-04 DIAGNOSIS — E11621 Type 2 diabetes mellitus with foot ulcer: Principal | ICD-10-CM

## 2018-09-04 DIAGNOSIS — E1159 Type 2 diabetes mellitus with other circulatory complications: Secondary | ICD-10-CM

## 2018-09-04 DIAGNOSIS — L97519 Non-pressure chronic ulcer of other part of right foot with unspecified severity: Secondary | ICD-10-CM

## 2018-09-04 DIAGNOSIS — I10 Essential (primary) hypertension: Secondary | ICD-10-CM

## 2018-09-04 LAB — BASIC METABOLIC PANEL
Anion gap: 9 (ref 5–15)
BUN: 20 mg/dL (ref 8–23)
CHLORIDE: 103 mmol/L (ref 98–111)
CO2: 26 mmol/L (ref 22–32)
CREATININE: 1 mg/dL (ref 0.44–1.00)
Calcium: 8.7 mg/dL — ABNORMAL LOW (ref 8.9–10.3)
GFR calc Af Amer: >60 mL/min (ref >60)
GFR, EST NON AFRICAN AMERICAN: 53 mL/min — AB (ref >60)
GLUCOSE: 137 mg/dL — AB (ref 70–99)
Potassium: 3.8 mmol/L (ref 3.5–5.1)
SODIUM: 138 mmol/L (ref 135–145)

## 2018-09-04 LAB — CBC WITH DIFFERENTIAL/PLATELET
Basophils Absolute: 0.1 10*3/uL (ref 0.0–0.1)
Basophils Relative: 1 %
EOS ABS: 0.1 10*3/uL (ref 0.0–0.5)
EOS PCT: 1 %
HCT: 33.8 % — ABNORMAL LOW (ref 36.0–46.0)
Hemoglobin: 11.1 g/dL — ABNORMAL LOW (ref 12.0–15.0)
LYMPHS ABS: 1.6 10*3/uL (ref 0.7–4.0)
LYMPHS PCT: 30 %
MCH: 29.8 pg (ref 26.0–34.0)
MCHC: 32.8 g/dL (ref 30.0–36.0)
MCV: 90.9 fL (ref 80.0–100.0)
MONOS PCT: 9 %
Monocytes Absolute: 0.5 10*3/uL (ref 0.1–1.0)
Neutro Abs: 3.2 10*3/uL (ref 1.7–7.7)
Neutrophils Relative %: 59 %
PLATELETS: 191 10*3/uL (ref 150–400)
RBC: 3.72 MIL/uL — ABNORMAL LOW (ref 3.87–5.11)
RDW: 13.5 % (ref 11.5–15.5)
WBC: 5.4 10*3/uL (ref 4.0–10.5)

## 2018-09-04 LAB — PREALBUMIN: PREALBUMIN: 16.2 mg/dL — AB (ref 18–38)

## 2018-09-04 LAB — GLUCOSE, CAPILLARY
GLUCOSE-CAPILLARY: 108 mg/dL — AB (ref 70–99)
GLUCOSE-CAPILLARY: 138 mg/dL — AB (ref 70–99)
Glucose-Capillary: 127 mg/dL — ABNORMAL HIGH (ref 70–99)
Glucose-Capillary: 131 mg/dL — ABNORMAL HIGH (ref 70–99)

## 2018-09-04 LAB — C-REACTIVE PROTEIN: CRP: 2.9 mg/dL — AB (ref <1.0)

## 2018-09-04 MED ORDER — PRO-STAT SUGAR FREE PO LIQD
30.0000 mL | Freq: Two times a day (BID) | ORAL | Status: DC
  Administered 2018-09-04 – 2018-09-07 (×6): 30 mL via ORAL
  Filled 2018-09-04 (×6): qty 30

## 2018-09-04 MED ORDER — JUVEN PO PACK: 1.0000 | PACK | Freq: Two times a day (BID) | ORAL | Status: DC

## 2018-09-04 MED ORDER — JUVEN PO PACK
1.0000 | PACK | Freq: Two times a day (BID) | ORAL | Status: DC
  Administered 2018-09-04 – 2018-09-07 (×6): 1 via ORAL
  Filled 2018-09-04 (×6): qty 1

## 2018-09-04 MED ORDER — GADOBUTROL 1 MMOL/ML IV SOLN
5.0000 mL | Freq: Once | INTRAVENOUS | Status: AC | PRN
  Administered 2018-09-04: 5 mL via INTRAVENOUS

## 2018-09-04 NOTE — Progress Notes (Signed)
Inpatient Diabetes Program Recommendations  AACE/ADA: New Consensus Statement on Inpatient Glycemic Control (2015)  Target Ranges:  Prepandial:   less than 140 mg/dL      Peak postprandial:   less than 180 mg/dL (1-2 hours)      Critically ill patients:  140 - 180 mg/dL   Lab Results  Component Value Date   GLUCAP 108 (H) 09/04/2018   HGBA1C 11.8 (H) 06/14/2015    Review of Glycemic Control Results for ARMETTA, HENRI (MRN [id number]) as of 09/04/2018 10:28  Ref. Range 09/03/2018 22:38 09/04/2018 07:31  Glucose-Capillary Latest Ref Range: 70 - 99 mg/dL 409 (H) 811 (H)   Diabetes history: Type 2 DM Outpatient Diabetes medications: Metformin 500 mg BID Current orders for Inpatient glycemic control: Metformin 500 mg BID, Novolog 0-9 units TID  Inpatient Diabetes Program Recommendations:    Noted consult per lower extremity wound order set. Blood sugars currently trending well.   Of note, last A1C last one appears to be from 2016. Appears A1C has been ordered and currently processing in lab. Will continue to follow.   Thanks, Lujean Rave, MSN, RNC-OB Diabetes Coordinator [phone] (8a-5p)

## 2018-09-04 NOTE — Progress Notes (Addendum)
Initial Nutrition Assessment  DOCUMENTATION CODES:     INTERVENTION:  -1 packet Juven BID, each packet provides 80 calories, 8 grams of carbohydrate, and 14 grams of amino acids; supplement contains CaHMB, glutamine, and arginine, to promote wound healing   -ProStat 30 ml BID (each 30 ml provides 100 kcal, 15 gr protein)   -Follow up results of A1C- return to review CHO modified diet    NUTRITION DIAGNOSIS:   Increased nutrient needs related to wound healing as evidenced by estimated needs.  GOAL:   Patient will meet greater than or equal to 90% of their needs  MONITOR:   PO intake, Labs, Weight trends, Skin, Supplement acceptance REASON FOR ASSESSMENT:   Consult Wound healing  ASSESSMENT:  Patient has a hx of DM-2, GERD, CVA (no residual deficit), HTN, Constipation and right foot wound. No osteomyelitis per imaging. Complaining of pain in foot. She lives with her husband who is here at bedside.     Patient home diet is regular. She has not been checking her blood glucose or following a consistent cho modified diet. The past 2 weeks patient has been unable to prepare meals and husband has been going out to get a hotdog or sandwich for lunch and dinner at night from local restaurant or soup and sandwich. Patient is not a breakfast eater but has been consuming a cereal bar with her coffee most days. She does not like Ensure but is willing to try a protein supplement. Discussed with her the importance of protein for wound healing and identtified good sources for her while emphasizing consistent intake through the day. Talked with her nurse today and reviewed nutrition focused interventions and plan.  Patient weight is down slightly from usual range 8-10 months ago. Usual weight 63-64 kg.   Labs: A1C- pending  BMP Latest Ref Rng & Units 09/04/2018 09/03/2018 04/20/2018  Glucose 70 - 99 mg/dL 161(W) 960(A) 540(J)  BUN 8 - 23 mg/dL 20 23 17   Creatinine 0.44 - 1.00 mg/dL 8.11 9.14  7.82(N)  Sodium 135 - 145 mmol/L 138 137 138  Potassium 3.5 - 5.1 mmol/L 3.8 3.7 3.3(L)  Chloride 98 - 111 mmol/L 103 101 99(L)  CO2 22 - 32 mmol/L 26 25 28   Calcium 8.9 - 10.3 mg/dL 5.6(O) 9.2 9.1   Medications reviewed and include: Lipitor, SSI, metformin   NUTRITION - FOCUSED PHYSICAL EXAM: deferred    Diet Order:   Diet Order            Diet heart healthy/carb modified Room service appropriate? Yes; Fluid consistency: Thin  Diet effective now              EDUCATION NEEDS:  (Follow up to provide DM education once A1C results are available)    Skin:  Skin Assessment: Skin Integrity Issues: Skin Integrity Issues:: Diabetic Ulcer(top right foot) Diabetic Ulcer: large open area   Last BM:  unknown  Height:   Ht Readings from Last 1 Encounters:  09/03/18 5\' 7"  (1.702 m)    Weight:   Wt Readings from Last 1 Encounters:  09/03/18 58.8 kg    Ideal Body Weight:  61 kg  BMI:  Body mass index is 20.3 kg/m.    Estimated Nutritional Needs:   Kcal:  1700-1950 (30-33 KCAL/KG/BW)  Protein:  77-83 gr (1.3-1.4 gr/kg/bw)  Fluid:  1.7-2.0 liters daily  Royann Shivers MS,RD,CSG,LDN Office: [phone] Pager: [phone]

## 2018-09-04 NOTE — Progress Notes (Signed)
PROGRESS NOTE    Holly Sparks  RUE:[id number] DOB: Apr 09, 1941 DOA: 09/03/2018 PCP: Gareth Morgan, MD    Brief Narrative:  77 year old female admitted to the hospital with right foot wound that have been present for several weeks.  Patient had noted large blistering of the right foot.  Her husband reports that she recently started wearing sandals that had straps that were tight.  He thinks that this may have led to her wounds due to friction.  She has surrounding erythema around her wounds.  She was admitted to the hospital for IV antibiotic therapy and further treatments   Assessment & Plan:   Principal Problem:   Diabetic ulcer of right foot due to type 2 diabetes mellitus (HCC) Active Problems:   Essential hypertension, benign   HLD (hyperlipidemia)   Essential hypertension   Paroxysmal atrial fibrillation (HCC)   Type 2 diabetes mellitus with circulatory disorder (HCC)   Grade I diastolic dysfunction   Hypomagnesemia   1. Ulcer of right foot complicated by diabetes.  I suspect that her ulcers may have developed after she started wearing sandals with a tight straps.  She has some erythema around open blisters.  She is on IV antibiotics.  Arterial Doppler shows ABIs greater than 1 bilaterally.  MRI does not show any evidence of osteomyelitis.  Wound care has seen the patient has recommended topical therapies.  Continue current treatments. 2. Type 2 diabetes.  Continued on metformin.  Supplement with sliding scale insulin.  A1c is in process.  Blood sugars been stable. 3. Paroxysmal atrial fibrillation.  Anticoagulation with Eliquis.  Continued on diltiazem and metoprolol. 4. Hypertension.  Blood pressure currently stable on Cardizem, lisinopril, metoprolol and hydrochlorothiazide. 5. Hyperlipidemia.  Continue on statin   DVT prophylaxis: Apixaban Code Status: Full code Family Communication: Discussed with husband at the bedside Disposition Plan: Discharge home once  improved  Severity of Illness: The appropriate patient status for this patient is INPATIENT. Inpatient status is judged to be reasonable and necessary in order to provide the required intensity of service to ensure the patient's safety. The patient's presenting symptoms, physical exam findings, and initial radiographic and laboratory data in the context of their chronic comorbidities is felt to place them at high risk for further clinical deterioration. Furthermore, it is not anticipated that the patient will be medically stable for discharge from the hospital within 2 midnights of admission. The following factors support the patient status of inpatient.    "           The patient's presenting symptoms include  progressive wounds on her right foot "           The worrisome physical exam findings include  large areas of blistering and erythema over right foot "           The chronic co-morbidities include  hypertension, diabetes     * I certify that at the point of admission it is my clinical judgment that the patient will require inpatient hospital care spanning beyond 2 midnights from the point of admission due to high intensity of service, high risk for further deterioration and high frequency of surveillance required.*   Consultants:   Wound care  Procedures:     Antimicrobials:   Vancomycin 10/7 >  Ceftriaxone 10/7 >  Flagyl 10/7 >   Subjective: Reports that she has pain in her wounds in her feet.  No shortness of breath or chest pain.  Objective: Vitals:   09/03/18 2234  09/03/18 2301 09/04/18 0653 09/04/18 0759  BP: (!) 155/101 128/74 125/85   Pulse: (!) 115 (!) 114 71   Resp: 18  18   Temp: 97.9 F (36.6 C)  98 F (36.7 C)   TempSrc: Oral  Oral   SpO2: 94% 99% 99% 99%  Weight: 58.8 kg     Height: 5\' 7"  (1.702 m)       Intake/Output Summary (Last 24 hours) at 09/04/2018 1858 Last data filed at 09/04/2018 1752 Gross per 24 hour  Intake 1627.03 ml  Output 1200 ml   Net 427.03 ml   Filed Weights   09/03/18 1720 09/03/18 2234  Weight: 59 kg 58.8 kg    Examination:  General exam: Appears calm and comfortable  Respiratory system: Clear to auscultation. Respiratory effort normal. Cardiovascular system: S1 & S2 heard, RRR. No JVD, murmurs, rubs, gallops or clicks. No pedal edema. Gastrointestinal system: Abdomen is nondistended, soft and nontender. No organomegaly or masses felt. Normal bowel sounds heard. Central nervous system: Alert and oriented. No focal neurological deficits. Extremities: Symmetric 5 x 5 power. Skin: Fluid-filled blister noted at right great toe, right heel with ruptured blister and surrounding erythema Psychiatry: Judgement and insight appear normal. Mood & affect appropriate.     Data Reviewed: I have personally reviewed following labs and imaging studies  CBC: Recent Labs  Lab 09/03/18 1937 09/04/18 0516  WBC 7.0 5.4  NEUTROABS 4.9 3.2  HGB 12.5 11.1*  HCT 37.9 33.8*  MCV 90.9 90.9  PLT 211 191   Basic Metabolic Panel: Recent Labs  Lab 09/03/18 1937 09/03/18 2143 09/04/18 0516  NA 137  --  138  K 3.7  --  3.8  CL 101  --  103  CO2 25  --  26  GLUCOSE 165*  --  137*  BUN 23  --  20  CREATININE 0.99  --  1.00  CALCIUM 9.2  --  8.7*  MG  --  1.3*  --   PHOS  --  3.2  --    GFR: Estimated Creatinine Clearance: 44.4 mL/min (by C-G formula based on SCr of 1 mg/dL). Liver Function Tests: Recent Labs  Lab 09/03/18 1937  AST 15  ALT 9  ALKPHOS 59  BILITOT 1.0  PROT 7.8  ALBUMIN 4.0   No results for input(s): LIPASE, AMYLASE in the last 168 hours. No results for input(s): AMMONIA in the last 168 hours. Coagulation Profile: No results for input(s): INR, PROTIME in the last 168 hours. Cardiac Enzymes: No results for input(s): CKTOTAL, CKMB, CKMBINDEX, TROPONINI in the last 168 hours. BNP (last 3 results) No results for input(s): PROBNP in the last 8760 hours. HbA1C: No results for input(s):  HGBA1C in the last 72 hours. CBG: Recent Labs  Lab 09/03/18 2238 09/04/18 0731 09/04/18 1200 09/04/18 1543  GLUCAP 161* 108* 127* 131*   Lipid Profile: No results for input(s): CHOL, HDL, LDLCALC, TRIG, CHOLHDL, LDLDIRECT in the last 72 hours. Thyroid Function Tests: No results for input(s): TSH, T4TOTAL, FREET4, T3FREE, THYROIDAB in the last 72 hours. Anemia Panel: No results for input(s): VITAMINB12, FOLATE, FERRITIN, TIBC, IRON, RETICCTPCT in the last 72 hours. Sepsis Labs: Recent Labs  Lab 09/03/18 1937 09/03/18 2143  LATICACIDVEN 1.9 1.3    Recent Results (from the past 240 hour(s))  Blood culture (routine x 2)     Status: None (Preliminary result)   Collection Time: 09/03/18  7:38 PM  Result Value Ref Range Status   Specimen Description  BLOOD RIGHT FOREARM  Final   Special Requests   Final    BOTTLES DRAWN AEROBIC AND ANAEROBIC Blood Culture adequate volume   Culture   Final    NO GROWTH < 12 HOURS Performed at [facility], [address] [place], [place] [address]    Report Status PENDING  Incomplete  Blood culture (routine x 2)     Status: None (Preliminary result)   Collection Time: 09/03/18  7:38 PM  Result Value Ref Range Status   Specimen Description LEFT ANTECUBITAL  Final   Special Requests   Final    BOTTLES DRAWN AEROBIC AND ANAEROBIC Blood Culture adequate volume   Culture   Final    NO GROWTH < 12 HOURS Performed at [facility], [address], [place], [place] [address]    Report Status PENDING  Incomplete         Radiology Studies: Dg Chest 2 View  Result Date: 09/03/2018 CLINICAL DATA:  Foot wound for several weeks with drainage, initial encounter EXAM: CHEST - 2 VIEW COMPARISON:  01/21/2018 FINDINGS: Cardiac shadows within normal limits. The lungs are well aerated bilaterally. No focal infiltrate or sizable effusion is seen. No acute bony abnormality is noted. Degenerative changes of the thoracic spine are noted. IMPRESSION: No active  cardiopulmonary disease. Electronically Signed   By: Alcide Clever M.D.   On: 09/03/2018 18:45   Dg Ankle Complete Right  Result Date: 09/03/2018 CLINICAL DATA:  Foot and ankle pain and redness.  Blisters. EXAM: RIGHT ANKLE - COMPLETE 3+ VIEW COMPARISON:  None. FINDINGS: No fracture or bone lesion. No bone resorption to suggest osteomyelitis. Ankle joint normally spaced and aligned.  No arthropathic changes. Moderate plantar calcaneal spur. Mild diffuse soft tissue swelling. IMPRESSION: No fracture, ankle joint abnormality or evidence of osteomyelitis. Electronically Signed   By: Amie Portland M.D.   On: 09/03/2018 20:56   Mr Foot Right W Wo Contrast  Result Date: 09/04/2018 CLINICAL DATA:  Foot swelling, wound on the foot for several weeks EXAM: MRI OF THE RIGHT FOREFOOT WITHOUT AND WITH CONTRAST TECHNIQUE: Multiplanar, multisequence MR imaging of the right forefoot was performed before and after the administration of intravenous contrast. CONTRAST:  5 mL Gadavist COMPARISON:  None. FINDINGS: Bones/Joint/Cartilage No marrow signal abnormality. No fracture or dislocation. Normal alignment. No joint effusion. No periosteal reaction or bone destruction. No aggressive osseous lesion. Mild osteoarthritis of the first MTP joint. Ligaments Collateral ligaments are intact.  Lisfranc ligament is intact. Muscles and Tendons Flexor, peroneal and extensor compartment tendons are intact. Achilles tendon is intact. Plantar fascia is intact. Muscles are normal. Soft tissue Fluid-filled skin blister along the dorsal aspect of the great toe measuring 1 x 2.7 x 2.4 cm. No peripheral enhancement. Fluid-filled skin blister along the posterolateral aspect of the hindfoot measuring 1 x 2.9 x 3.1 cm without enhancement. No soft tissue mass. IMPRESSION: 1. No osteomyelitis of the right foot. 2. Fluid-filled skin blisters along the dorsal aspect of the great toe and posterolateral hindfoot Electronically Signed   By: Elige Ko    On: 09/04/2018 11:44   US Arterial Abi (screening Lower Extremity)  Result Date: 09/04/2018 CLINICAL DATA:  Multiple large blisters/ulcerations on the right foot. EXAM: NONINVASIVE PHYSIOLOGIC VASCULAR STUDY OF BILATERAL LOWER EXTREMITIES TECHNIQUE: Evaluation of both lower extremities were performed at rest, including calculation of ankle-brachial indices with single level Doppler, pressure and pulse volume recording. COMPARISON:  None. FINDINGS: Right ABI:  1.08 Left ABI:  1.06 Right Lower  Extremity: Irregular waveforms are likely associated with a dysrhythmia. Left Lower Extremity: Irregular waveforms are likely associated with a dysrhythmia. IMPRESSION: Normal ankle-brachial indices. Irregular waveforms are likely associated with a dysrhythmia. Electronically Signed   By: Richarda Overlie M.D.   On: 09/04/2018 10:24   Dg Foot Complete Right  Result Date: 09/03/2018 CLINICAL DATA:  Blisters at Right big toe, right heel, across top of right foot, some blisters have burst, wounds have been on right foot for weeks, pain EXAM: RIGHT FOOT COMPLETE - 3+ VIEW COMPARISON:  None. FINDINGS: No fracture. No bone lesion. There are no areas of bone resorption to suggest osteomyelitis. Mild first metatarsophalangeal joint osteoarthritis. Remaining joints normally spaced and aligned. Moderate plantar calcaneal spur. Mild dorsal soft tissue swelling. IMPRESSION: No fracture or dislocation.  No evidence of osteomyelitis. Electronically Signed   By: Amie Portland M.D.   On: 09/03/2018 20:55        Scheduled Meds: . apixaban  5 mg Oral BID  . atorvastatin  20 mg Oral QHS  . diltiazem  120 mg Oral Daily  . feeding supplement (PRO-STAT SUGAR FREE 64)  30 mL Oral BID  . lisinopril  20 mg Oral Daily   And  . hydrochlorothiazide  12.5 mg Oral Daily  . insulin aspart  0-9 Units Subcutaneous TID WC  . metFORMIN  500 mg Oral BID WC  . metoprolol tartrate  12.5 mg Oral BID  . nutrition supplement (JUVEN)  1 packet Oral  BID BM   Continuous Infusions: . sodium chloride 88 mL/hr at 09/04/18 1659  . cefTRIAXone (ROCEPHIN)  IV Stopped (09/04/18 0000)  . metronidazole 500 mg (09/04/18 1456)  . vancomycin       LOS: 1 day    Time spent:    Erick Blinks, MD [facility] Pager [phone]  If 7PM-7AM, please contact night-coverage [email or web address] Password [facility] 09/04/2018, 6:58 PM

## 2018-09-04 NOTE — Consult Note (Signed)
WOC Nurse wound consult note Reason for Consult:cellulitis to right foot from ruptured serum filled blisters to right foot.  New blister to left dorsal foot.  Wound type:infectious, inflammatory Pressure Injury POA: NA Measurement:Right great toe with1 cm x 0.3 cm fluid filled blister Right heeL;  4 cm x 4.3 cm x 0.2 cm ruptured blister with surrounding erythema, extends to right medial foot.  New blister to left dorsal foot:  0.5 cm x 0.2 cm intact blister Wound GNF:[id number] blister Drainage (amount, consistency, odor) moderate serous effluent from blister Periwound:erythema and edema Dressing procedure/placement/frequency: Cleanse blisters to bilateral feet with NS. Apply Xeroform to intact and nonintact blisters.  Cover with gauze and kerlix.  Change daily. Bedside RN to perform.  Will not follow at this time.  Please re-consult if needed.  Maple Hudson MSN, RN, FNP-BC CWON Wound, Ostomy, Continence Nurse Pager [phone]

## 2018-09-04 NOTE — Clinical Social Work Note (Signed)
Patient referred to CSW for access to medications. This is a case management function. Case management has been consulted and will address as resources are available.    Juandiego Kolenovic, Juleen China, LCSW

## 2018-09-05 LAB — BASIC METABOLIC PANEL
Anion gap: 8 (ref 5–15)
BUN: 23 mg/dL (ref 8–23)
CO2: 25 mmol/L (ref 22–32)
CREATININE: 0.89 mg/dL (ref 0.44–1.00)
Calcium: 8.6 mg/dL — ABNORMAL LOW (ref 8.9–10.3)
Chloride: 103 mmol/L (ref 98–111)
GFR calc Af Amer: >60 mL/min (ref >60)
GLUCOSE: 106 mg/dL — AB (ref 70–99)
POTASSIUM: 3.5 mmol/L (ref 3.5–5.1)
SODIUM: 136 mmol/L (ref 135–145)

## 2018-09-05 LAB — CBC WITH DIFFERENTIAL/PLATELET
Abs Immature Granulocytes: 0.02 10*3/uL (ref 0.00–0.07)
BASOS PCT: 1 %
Basophils Absolute: 0 10*3/uL (ref 0.0–0.1)
EOS ABS: 0.1 10*3/uL (ref 0.0–0.5)
EOS PCT: 1 %
HCT: 31 % — ABNORMAL LOW (ref 36.0–46.0)
Hemoglobin: 9.9 g/dL — ABNORMAL LOW (ref 12.0–15.0)
Immature Granulocytes: 0 %
Lymphocytes Relative: 23 %
Lymphs Abs: 1.2 10*3/uL (ref 0.7–4.0)
MCH: 29.2 pg (ref 26.0–34.0)
MCHC: 31.9 g/dL (ref 30.0–36.0)
MCV: 91.4 fL (ref 80.0–100.0)
MONO ABS: 0.3 10*3/uL (ref 0.1–1.0)
MONOS PCT: 5 %
Neutro Abs: 3.4 10*3/uL (ref 1.7–7.7)
Neutrophils Relative %: 70 %
PLATELETS: 183 10*3/uL (ref 150–400)
RBC: 3.39 MIL/uL — AB (ref 3.87–5.11)
RDW: 13.4 % (ref 11.5–15.5)
WBC: 5 10*3/uL (ref 4.0–10.5)
nRBC: 0 % (ref 0.0–0.2)

## 2018-09-05 LAB — GLUCOSE, CAPILLARY
Glucose-Capillary: 101 mg/dL — ABNORMAL HIGH (ref 70–99)
Glucose-Capillary: 117 mg/dL — ABNORMAL HIGH (ref 70–99)
Glucose-Capillary: 107 mg/dL — ABNORMAL HIGH (ref 70–99)
Glucose-Capillary: 104 mg/dL — ABNORMAL HIGH (ref 70–99)

## 2018-09-05 LAB — HEMOGLOBIN A1C
HEMOGLOBIN A1C: 8 % — AB (ref 4.8–5.6)
MEAN PLASMA GLUCOSE: 183 mg/dL

## 2018-09-05 LAB — HIV ANTIBODY (ROUTINE TESTING W REFLEX): HIV Screen 4th Generation wRfx: NONREACTIVE

## 2018-09-05 NOTE — Care Management Important Message (Signed)
Important Message  Patient Details  Name: Holly Sparks MRN: [id number] Date of Birth: 09-07-1941   Medicare Important Message Given:  Yes    Renie Ora 09/05/2018, 11:41 AM

## 2018-09-05 NOTE — Progress Notes (Signed)
PROGRESS NOTE    Holly Sparks  NWG:[id number] DOB: 1941-03-06 DOA: 09/03/2018 PCP: Gareth Morgan, MD    Brief Narrative:  77 year old female admitted to the hospital with right foot wound that have been present for several weeks.  Patient had noted large blistering of the right foot.  Her husband reports that she recently started wearing sandals that had straps that were tight.  He thinks that this may have led to her wounds due to friction.  She has surrounding erythema around her wounds.  She was admitted to the hospital for IV antibiotic therapy and further treatments   Assessment & Plan:   Principal Problem:   Diabetic ulcer of right foot due to type 2 diabetes mellitus (HCC) Active Problems:   Essential hypertension, benign   HLD (hyperlipidemia)   Essential hypertension   Paroxysmal atrial fibrillation (HCC)   Type 2 diabetes mellitus with circulatory disorder (HCC)   Grade I diastolic dysfunction   Hypomagnesemia   1. Ulcer of right foot complicated by diabetes.  I suspect that her ulcers may have developed after she started wearing sandals with a tight straps.  She has some erythema around open blisters.  She is on IV antibiotics.  Arterial Doppler shows ABIs greater than 1 bilaterally.  MRI does not show any evidence of osteomyelitis.  Wound care has seen the patient has recommended topical therapies.  Would continue IV antibiotics for another 24 hours with anticipation of transition to oral antibiotics tomorrow. 2. Type 2 diabetes.  Continued on metformin.  Supplement with sliding scale insulin.  A1c is 8.0.  Blood sugars been stable. 3. Paroxysmal atrial fibrillation.  Anticoagulation with Eliquis.  Continued on diltiazem and metoprolol. 4. Hypertension.  Blood pressure currently stable on Cardizem, lisinopril, metoprolol and hydrochlorothiazide. 5. Hyperlipidemia.  Continue on statin   DVT prophylaxis: Apixaban Code Status: Full code Family Communication:  Discussed with husband at the bedside Disposition Plan: Discharge home once improved   Consultants:   Wound care  Procedures:     Antimicrobials:   Vancomycin 10/7 >  Ceftriaxone 10/7 >  Flagyl 10/7 > 10/9   Subjective: Reports that she still has some pain in the wounds of her feet.  No shortness of breath or chest pain. Objective: Vitals:   09/04/18 2152 09/05/18 0530 09/05/18 0841 09/05/18 1353  BP: 112/66 110/67  (!) 110/59  Pulse: (!) 107 94  65  Resp: 19 18  16   Temp: 98.3 F (36.8 C) 98.1 F (36.7 C)    TempSrc: Oral Oral    SpO2: 97% 96% 97% 99%  Weight:      Height:        Intake/Output Summary (Last 24 hours) at 09/05/2018 1650 Last data filed at 09/05/2018 0332 Gross per 24 hour  Intake 240 ml  Output 600 ml  Net -360 ml   Filed Weights   09/03/18 1720 09/03/18 2234  Weight: 59 kg 58.8 kg    Examination:  General exam: Appears calm and comfortable  Respiratory system: Clear to auscultation. Respiratory effort normal. Cardiovascular system: S1 & S2 heard, RRR. No JVD, murmurs, rubs, gallops or clicks. No pedal edema. Gastrointestinal system: Abdomen is nondistended, soft and nontender. No organomegaly or masses felt. Normal bowel sounds heard. Central nervous system: Alert and oriented. No focal neurological deficits. Extremities: Symmetric 5 x 5 power. Skin: Areas of erythema on right foot mildly better, but still persists.  Still has open wounds which are draining clear fluid, unchanged from yesterday. Psychiatry: Judgement  and insight appear normal. Mood & affect appropriate.     Data Reviewed: I have personally reviewed following labs and imaging studies  CBC: Recent Labs  Lab 09/03/18 1937 09/04/18 0516 09/05/18 0516  WBC 7.0 5.4 5.0  NEUTROABS 4.9 3.2 3.4  HGB 12.5 11.1* 9.9*  HCT 37.9 33.8* 31.0*  MCV 90.9 90.9 91.4  PLT 211 191 183   Basic Metabolic Panel: Recent Labs  Lab 09/03/18 1937 09/03/18 2143 09/04/18 0516  09/05/18 0516  NA 137  --  138 136  K 3.7  --  3.8 3.5  CL 101  --  103 103  CO2 25  --  26 25  GLUCOSE 165*  --  137* 106*  BUN 23  --  20 23  CREATININE 0.99  --  1.00 0.89  CALCIUM 9.2  --  8.7* 8.6*  MG  --  1.3*  --   --   PHOS  --  3.2  --   --    GFR: Estimated Creatinine Clearance: 49.9 mL/min (by C-G formula based on SCr of 0.89 mg/dL). Liver Function Tests: Recent Labs  Lab 09/03/18 1937  AST 15  ALT 9  ALKPHOS 59  BILITOT 1.0  PROT 7.8  ALBUMIN 4.0   No results for input(s): LIPASE, AMYLASE in the last 168 hours. No results for input(s): AMMONIA in the last 168 hours. Coagulation Profile: No results for input(s): INR, PROTIME in the last 168 hours. Cardiac Enzymes: No results for input(s): CKTOTAL, CKMB, CKMBINDEX, TROPONINI in the last 168 hours. BNP (last 3 results) No results for input(s): PROBNP in the last 8760 hours. HbA1C: Recent Labs    09/03/18 2141  HGBA1C 8.0*   CBG: Recent Labs  Lab 09/04/18 1543 09/04/18 2152 09/05/18 0745 09/05/18 1144 09/05/18 1612  GLUCAP 131* 138* 101* 117* 107*   Lipid Profile: No results for input(s): CHOL, HDL, LDLCALC, TRIG, CHOLHDL, LDLDIRECT in the last 72 hours. Thyroid Function Tests: No results for input(s): TSH, T4TOTAL, FREET4, T3FREE, THYROIDAB in the last 72 hours. Anemia Panel: No results for input(s): VITAMINB12, FOLATE, FERRITIN, TIBC, IRON, RETICCTPCT in the last 72 hours. Sepsis Labs: Recent Labs  Lab 09/03/18 1937 09/03/18 2143  LATICACIDVEN 1.9 1.3    Recent Results (from the past 240 hour(s))  Blood culture (routine x 2)     Status: None (Preliminary result)   Collection Time: 09/03/18  7:38 PM  Result Value Ref Range Status   Specimen Description BLOOD RIGHT FOREARM  Final   Special Requests   Final    BOTTLES DRAWN AEROBIC AND ANAEROBIC Blood Culture adequate volume   Culture   Final    NO GROWTH 2 DAYS Performed at [facility], [address], [place], [place] [address]     Report Status PENDING  Incomplete  Blood culture (routine x 2)     Status: None (Preliminary result)   Collection Time: 09/03/18  7:38 PM  Result Value Ref Range Status   Specimen Description LEFT ANTECUBITAL  Final   Special Requests   Final    BOTTLES DRAWN AEROBIC AND ANAEROBIC Blood Culture adequate volume   Culture   Final    NO GROWTH 2 DAYS Performed at [facility], [address] [place], [place] [address]    Report Status PENDING  Incomplete         Radiology Studies: Dg Chest 2 View  Result Date: 09/03/2018 CLINICAL DATA:  Foot wound for several weeks with drainage, initial encounter EXAM: CHEST -  2 VIEW COMPARISON:  01/21/2018 FINDINGS: Cardiac shadows within normal limits. The lungs are well aerated bilaterally. No focal infiltrate or sizable effusion is seen. No acute bony abnormality is noted. Degenerative changes of the thoracic spine are noted. IMPRESSION: No active cardiopulmonary disease. Electronically Signed   By: Alcide Clever M.D.   On: 09/03/2018 18:45   Dg Ankle Complete Right  Result Date: 09/03/2018 CLINICAL DATA:  Foot and ankle pain and redness.  Blisters. EXAM: RIGHT ANKLE - COMPLETE 3+ VIEW COMPARISON:  None. FINDINGS: No fracture or bone lesion. No bone resorption to suggest osteomyelitis. Ankle joint normally spaced and aligned.  No arthropathic changes. Moderate plantar calcaneal spur. Mild diffuse soft tissue swelling. IMPRESSION: No fracture, ankle joint abnormality or evidence of osteomyelitis. Electronically Signed   By: Amie Portland M.D.   On: 09/03/2018 20:56   Mr Foot Right W Wo Contrast  Result Date: 09/04/2018 CLINICAL DATA:  Foot swelling, wound on the foot for several weeks EXAM: MRI OF THE RIGHT FOREFOOT WITHOUT AND WITH CONTRAST TECHNIQUE: Multiplanar, multisequence MR imaging of the right forefoot was performed before and after the administration of intravenous contrast. CONTRAST:  5 mL Gadavist COMPARISON:  None. FINDINGS:  Bones/Joint/Cartilage No marrow signal abnormality. No fracture or dislocation. Normal alignment. No joint effusion. No periosteal reaction or bone destruction. No aggressive osseous lesion. Mild osteoarthritis of the first MTP joint. Ligaments Collateral ligaments are intact.  Lisfranc ligament is intact. Muscles and Tendons Flexor, peroneal and extensor compartment tendons are intact. Achilles tendon is intact. Plantar fascia is intact. Muscles are normal. Soft tissue Fluid-filled skin blister along the dorsal aspect of the great toe measuring 1 x 2.7 x 2.4 cm. No peripheral enhancement. Fluid-filled skin blister along the posterolateral aspect of the hindfoot measuring 1 x 2.9 x 3.1 cm without enhancement. No soft tissue mass. IMPRESSION: 1. No osteomyelitis of the right foot. 2. Fluid-filled skin blisters along the dorsal aspect of the great toe and posterolateral hindfoot Electronically Signed   By: Elige Ko   On: 09/04/2018 11:44   US Arterial Abi (screening Lower Extremity)  Result Date: 09/04/2018 CLINICAL DATA:  Multiple large blisters/ulcerations on the right foot. EXAM: NONINVASIVE PHYSIOLOGIC VASCULAR STUDY OF BILATERAL LOWER EXTREMITIES TECHNIQUE: Evaluation of both lower extremities were performed at rest, including calculation of ankle-brachial indices with single level Doppler, pressure and pulse volume recording. COMPARISON:  None. FINDINGS: Right ABI:  1.08 Left ABI:  1.06 Right Lower Extremity: Irregular waveforms are likely associated with a dysrhythmia. Left Lower Extremity: Irregular waveforms are likely associated with a dysrhythmia. IMPRESSION: Normal ankle-brachial indices. Irregular waveforms are likely associated with a dysrhythmia. Electronically Signed   By: Richarda Overlie M.D.   On: 09/04/2018 10:24   Dg Foot Complete Right  Result Date: 09/03/2018 CLINICAL DATA:  Blisters at Right big toe, right heel, across top of right foot, some blisters have burst, wounds have been on right  foot for weeks, pain EXAM: RIGHT FOOT COMPLETE - 3+ VIEW COMPARISON:  None. FINDINGS: No fracture. No bone lesion. There are no areas of bone resorption to suggest osteomyelitis. Mild first metatarsophalangeal joint osteoarthritis. Remaining joints normally spaced and aligned. Moderate plantar calcaneal spur. Mild dorsal soft tissue swelling. IMPRESSION: No fracture or dislocation.  No evidence of osteomyelitis. Electronically Signed   By: Amie Portland M.D.   On: 09/03/2018 20:55        Scheduled Meds: . apixaban  5 mg Oral BID  . atorvastatin  20 mg Oral QHS  .  diltiazem  120 mg Oral Daily  . feeding supplement (PRO-STAT SUGAR FREE 64)  30 mL Oral BID  . lisinopril  20 mg Oral Daily   And  . hydrochlorothiazide  12.5 mg Oral Daily  . insulin aspart  0-9 Units Subcutaneous TID WC  . metFORMIN  500 mg Oral BID WC  . metoprolol tartrate  12.5 mg Oral BID  . nutrition supplement (JUVEN)  1 packet Oral BID BM   Continuous Infusions: . sodium chloride 88 mL/hr at 09/05/18 0615  . cefTRIAXone (ROCEPHIN)  IV 2 g (09/04/18 2211)  . vancomycin 750 mg (09/04/18 2249)     LOS: 2 days    Time spent:    Erick Blinks, MD [facility] Pager [phone]  If 7PM-7AM, please contact night-coverage [email or web address] Password [email or web address] 09/05/2018, 4:50 PM

## 2018-09-05 NOTE — Care Management Note (Signed)
Case Management Note  Patient Details  Name: Holly Sparks MRN: [id number] Date of Birth: [birth year]  Subjective/Objective:    Admitted with diabetic foot ulcer. Pt from home, lives with husband who is very supportive, uses a RW at baseline. Pt performs wound care herself, had HH nurse with [facility] start not long before coming to hospital. Pt's husband at bedside for DC discussion. Pt says she is not ready for DC, likes being int he hospital with someone to take care of her, CM discussed plan for DC soon and MD would discuss further when he rounds.                 Action/Plan: DC home in next 1-2 days with resumption of HH services through [facility]. Pt aware HH has 48 hrs to resume services. [name], [facility] rep, aware of admission and DC plan.   Expected Discharge Date:     09/05/18             Expected Discharge Plan:  Home w Home Health Services  In-House Referral:  NA  Discharge planning Services  CM Consult  Post Acute Care Choice:  Home Health Choice offered to:  Patient  HH Arranged:  [facility] Agency:  [facility]  Status of Service:  In process, will continue to follow  If discussed at Long Length of Stay Meetings, dates discussed:    Additional Comments: Addendum: per [facility] rep, pt admitted and DC'd same day. Pt will need new HH orders at DC. [facility] rep aware and will follow.   Malcolm Metro, RN 09/05/2018, 10:47 AM

## 2018-09-06 LAB — CBC WITH DIFFERENTIAL/PLATELET
Abs Immature Granulocytes: 0.01 10*3/uL (ref 0.00–0.07)
BASOS ABS: 0 10*3/uL (ref 0.0–0.1)
Basophils Relative: 1 %
EOS ABS: 0.1 10*3/uL (ref 0.0–0.5)
EOS PCT: 2 %
HEMATOCRIT: 33.3 % — AB (ref 36.0–46.0)
Hemoglobin: 10.7 g/dL — ABNORMAL LOW (ref 12.0–15.0)
IMMATURE GRANULOCYTES: 0 %
LYMPHS ABS: 1.5 10*3/uL (ref 0.7–4.0)
Lymphocytes Relative: 30 %
MCH: 29.8 pg (ref 26.0–34.0)
MCHC: 32.1 g/dL (ref 30.0–36.0)
MCV: 92.8 fL (ref 80.0–100.0)
Monocytes Absolute: 0.4 10*3/uL (ref 0.1–1.0)
Monocytes Relative: 7 %
NEUTROS PCT: 60 %
NRBC: 0 % (ref 0.0–0.2)
Neutro Abs: 2.9 10*3/uL (ref 1.7–7.7)
PLATELETS: 183 10*3/uL (ref 150–400)
RBC: 3.59 MIL/uL — AB (ref 3.87–5.11)
RDW: 13.3 % (ref 11.5–15.5)
WBC: 4.9 10*3/uL (ref 4.0–10.5)

## 2018-09-06 LAB — BASIC METABOLIC PANEL
Anion gap: 8 (ref 5–15)
BUN: 22 mg/dL (ref 8–23)
CO2: 26 mmol/L (ref 22–32)
CREATININE: 0.86 mg/dL (ref 0.44–1.00)
Calcium: 8.9 mg/dL (ref 8.9–10.3)
Chloride: 104 mmol/L (ref 98–111)
GFR calc non Af Amer: >60 mL/min (ref >60)
GLUCOSE: 110 mg/dL — AB (ref 70–99)
Potassium: 3.8 mmol/L (ref 3.5–5.1)
SODIUM: 138 mmol/L (ref 135–145)

## 2018-09-06 LAB — GLUCOSE, CAPILLARY
GLUCOSE-CAPILLARY: 154 mg/dL — AB (ref 70–99)
Glucose-Capillary: 97 mg/dL (ref 70–99)
Glucose-Capillary: 92 mg/dL (ref 70–99)
Glucose-Capillary: 121 mg/dL — ABNORMAL HIGH (ref 70–99)

## 2018-09-06 MED ORDER — DOXYCYCLINE HYCLATE 100 MG PO TABS
100.0000 mg | ORAL_TABLET | Freq: Two times a day (BID) | ORAL | Status: DC
  Administered 2018-09-06 – 2018-09-07 (×2): 100 mg via ORAL
  Filled 2018-09-06 (×2): qty 1

## 2018-09-06 NOTE — Clinical Social Work Note (Signed)
Clinical Social Work Assessment  Patient Details  Name: Holly Sparks MRN: [id number] Date of Birth: 02/15/1941  Date of referral:  09/06/18               Reason for consult:  Facility Placement, Discharge Planning                Permission sought to share information with:  Facility Art therapist granted to share information::     Name::        Agency::  [place]  Relationship::     Contact Information:     Housing/Transportation Living arrangements for the past 2 months:  Single Family Home Source of Information:  Patient Patient Interpreter Needed:  None Criminal Activity/Legal Involvement Pertinent to Current Situation/Hospitalization:  No - Comment as needed Significant Relationships:  Spouse Lives with:  Spouse Do you feel safe going back to the place where you live?  Yes Need for family participation in patient care:  No (Coment)  Care giving concerns: PT recommending SNF.   Social Worker assessment / plan: Pt is a 77 year old female referred to CSW for SNF rehab placement. Met with pt and her husband to assess. Pt is alert and oriented x4. She is very pleasant. Pt is agreeable to SNF rehab. She requests referral to [facility]. Will refer. Pt will need insurance authorization before she can dc.  Employment status:  Retired Nurse, adult PT Recommendations:  [place] / Referral to community resources:  [place]  Patient/Family's Response to care: Pt accepting of care.  Patient/Family's Understanding of and Emotional Response to Diagnosis, Current Treatment, and Prognosis: Pt appears to have a good understanding of diagnosis and treatment recommendations. No emotional distress identified.   Emotional Assessment Appearance:  Appears stated age Attitude/Demeanor/Rapport:  Engaged Affect (typically observed):  Calm, Pleasant Orientation:  Oriented to Self, Oriented to Place, Oriented to   Time, Oriented to Situation Alcohol / Substance use:  Not Applicable Psych involvement (Current and /or in the community):  No (Comment)  Discharge Needs  Concerns to be addressed:  Discharge Planning Concerns Readmission within the last 30 days:  No Current discharge risk:  Physical Impairment Barriers to Discharge:  [place], LCSW 09/06/2018, 11:08 AM

## 2018-09-06 NOTE — Care Management (Signed)
Patient disposition plan is now to go to SNF. Previous referral had been made to [facility].  Alroy Bailiff of [facility] notiifed of change in plan. CM will sign off.

## 2018-09-06 NOTE — Progress Notes (Signed)
PROGRESS NOTE    XCARET MORAD  ZOX:[id number] DOB: [birth year] DOA: 09/03/2018 PCP: Gareth Morgan, MD    Brief Narrative:  77 year old female admitted to the hospital with right foot wound that have been present for several weeks.  Patient had noted large blistering of the right foot.  Her husband reports that she recently started wearing sandals that had straps that were tight.  He thinks that this may have led to her wounds due to friction.  She has surrounding erythema around her wounds.  She was admitted to the hospital for IV antibiotic therapy and further treatments   Assessment & Plan:   Principal Problem:   Diabetic ulcer of right foot due to type 2 diabetes mellitus (HCC) Active Problems:   Essential hypertension, benign   HLD (hyperlipidemia)   Essential hypertension   Paroxysmal atrial fibrillation (HCC)   Type 2 diabetes mellitus with circulatory disorder (HCC)   Grade I diastolic dysfunction   Hypomagnesemia   1. Ulcer of right foot complicated by diabetes.  I suspect that her ulcers may have developed after she started wearing sandals with a tight straps.  She has some erythema around open blisters.  She is on IV antibiotics.  Arterial Doppler shows ABIs greater than 1 bilaterally.  MRI does not show any evidence of osteomyelitis.  Wound care has seen the patient and has recommended topical therapies.  Since she is clinically improving, will transition antibiotics to oral doxycycline. 2. Type 2 diabetes.  Continued on metformin.  Supplement with sliding scale insulin.  A1c is 8.0.  Blood sugars remained stable 3. Paroxysmal atrial fibrillation.  Anticoagulation with Eliquis.  Continued on diltiazem and metoprolol. 4. Hypertension.  Blood pressure currently stable on Cardizem, lisinopril, metoprolol and hydrochlorothiazide. 5. Hyperlipidemia.  Continue on statin   DVT prophylaxis: Apixaban Code Status: Full code Family Communication: Discussed with husband at the  bedside Disposition Plan: Seen by physical therapy with recommendations for skilled nursing facility placement   Consultants:   Wound care  Procedures:     Antimicrobials:   Vancomycin 10/7 > 10/10  Ceftriaxone 10/7 > 10/10  Flagyl 10/7 > 10/9  Doxycycline 10/10 >   Subjective: Reports continued pain in the wounds in her foot.  No shortness of breath.  Otherwise no new complaints  Objective: Vitals:   09/05/18 1953 09/05/18 2122 09/06/18 0527 09/06/18 0954  BP:  (!) 147/88 131/84   Pulse:  73 77 66  Resp:  20 18   Temp:  98.1 F (36.7 C) (!) 97.5 F (36.4 C)   TempSrc:  Oral Oral   SpO2: 94% 100% 99%   Weight:      Height:        Intake/Output Summary (Last 24 hours) at 09/06/2018 1731 Last data filed at 09/06/2018 0653 Gross per 24 hour  Intake 3989.11 ml  Output 1100 ml  Net 2889.11 ml   Filed Weights   09/03/18 1720 09/03/18 2234  Weight: 59 kg 58.8 kg    Examination:  General exam: Alert, awake, oriented x 3 Respiratory system: Clear to auscultation. Respiratory effort normal. Cardiovascular system:RRR. No murmurs, rubs, gallops. Gastrointestinal system: Abdomen is nondistended, soft and nontender. No organomegaly or masses felt. Normal bowel sounds heard. Central nervous system: Alert and oriented. No focal neurological deficits. Extremities: No C/C/E, +pedal pulses Skin: Swelling is present in right lower extremity.  She has dependent edema.  Areas of erythema on right foot appear to be improving.  Wounds appear to be healing.  Still draining clear fluid. Psychiatry: Judgement and insight appear normal. Mood & affect appropriate.   Data Reviewed: I have personally reviewed following labs and imaging studies  CBC: Recent Labs  Lab 09/03/18 1937 09/04/18 0516 09/05/18 0516 09/06/18 0539  WBC 7.0 5.4 5.0 4.9  NEUTROABS 4.9 3.2 3.4 2.9  HGB 12.5 11.1* 9.9* 10.7*  HCT 37.9 33.8* 31.0* 33.3*  MCV 90.9 90.9 91.4 92.8  PLT 211 191 183 183    Basic Metabolic Panel: Recent Labs  Lab 09/03/18 1937 09/03/18 2143 09/04/18 0516 09/05/18 0516 09/06/18 0539  NA 137  --  138 136 138  K 3.7  --  3.8 3.5 3.8  CL 101  --  103 103 104  CO2 25  --  26 25 26   GLUCOSE 165*  --  137* 106* 110*  BUN 23  --  20 23 22   CREATININE 0.99  --  1.00 0.89 0.86  CALCIUM 9.2  --  8.7* 8.6* 8.9  MG  --  1.3*  --   --   --   PHOS  --  3.2  --   --   --    GFR: Estimated Creatinine Clearance: 51.7 mL/min (by C-G formula based on SCr of 0.86 mg/dL). Liver Function Tests: Recent Labs  Lab 09/03/18 1937  AST 15  ALT 9  ALKPHOS 59  BILITOT 1.0  PROT 7.8  ALBUMIN 4.0   No results for input(s): LIPASE, AMYLASE in the last 168 hours. No results for input(s): AMMONIA in the last 168 hours. Coagulation Profile: No results for input(s): INR, PROTIME in the last 168 hours. Cardiac Enzymes: No results for input(s): CKTOTAL, CKMB, CKMBINDEX, TROPONINI in the last 168 hours. BNP (last 3 results) No results for input(s): PROBNP in the last 8760 hours. HbA1C: Recent Labs    09/03/18 2141  HGBA1C 8.0*   CBG: Recent Labs  Lab 09/05/18 1612 09/05/18 2123 09/06/18 0750 09/06/18 1125 09/06/18 1624  GLUCAP 107* 104* 97 154* 92   Lipid Profile: No results for input(s): CHOL, HDL, LDLCALC, TRIG, CHOLHDL, LDLDIRECT in the last 72 hours. Thyroid Function Tests: No results for input(s): TSH, T4TOTAL, FREET4, T3FREE, THYROIDAB in the last 72 hours. Anemia Panel: No results for input(s): VITAMINB12, FOLATE, FERRITIN, TIBC, IRON, RETICCTPCT in the last 72 hours. Sepsis Labs: Recent Labs  Lab 09/03/18 1937 09/03/18 2143  LATICACIDVEN 1.9 1.3    Recent Results (from the past 240 hour(s))  Blood culture (routine x 2)     Status: None (Preliminary result)   Collection Time: 09/03/18  7:38 PM  Result Value Ref Range Status   Specimen Description BLOOD RIGHT FOREARM  Final   Special Requests   Final    BOTTLES DRAWN AEROBIC AND ANAEROBIC  Blood Culture adequate volume   Culture   Final    NO GROWTH 3 DAYS Performed at [facility], [address], [place], [place] [address]    Report Status PENDING  Incomplete  Blood culture (routine x 2)     Status: None (Preliminary result)   Collection Time: 09/03/18  7:38 PM  Result Value Ref Range Status   Specimen Description LEFT ANTECUBITAL  Final   Special Requests   Final    BOTTLES DRAWN AEROBIC AND ANAEROBIC Blood Culture adequate volume   Culture   Final    NO GROWTH 3 DAYS Performed at [facility], [address] [place], [place] [address]    Report Status PENDING  Incomplete  Radiology Studies: No results found.      Scheduled Meds: . apixaban  5 mg Oral BID  . atorvastatin  20 mg Oral QHS  . diltiazem  120 mg Oral Daily  . feeding supplement (PRO-STAT SUGAR FREE 64)  30 mL Oral BID  . lisinopril  20 mg Oral Daily   And  . hydrochlorothiazide  12.5 mg Oral Daily  . insulin aspart  0-9 Units Subcutaneous TID WC  . metFORMIN  500 mg Oral BID WC  . metoprolol tartrate  12.5 mg Oral BID  . nutrition supplement (JUVEN)  1 packet Oral BID BM   Continuous Infusions: . sodium chloride 88 mL/hr at 09/06/18 1028  . cefTRIAXone (ROCEPHIN)  IV 2 g (09/05/18 2146)  . vancomycin 750 mg (09/05/18 2327)     LOS: 3 days    Time spent:    Erick Blinks, MD [facility] Pager [phone]  If 7PM-7AM, please contact night-coverage [email or web address] Password [email or web address] 09/06/2018, 5:31 PM

## 2018-09-06 NOTE — Evaluation (Addendum)
Physical Therapy Evaluation Patient Details Name: Holly Sparks MRN: [id number] DOB: Aug 05, 1941 Today's Date: 09/06/2018   History of Present Illness  Holly Sparks is a 77 y.o. 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 John Giovanni for evaluation of a large blistered right foot wound for over 2 weeks that has not responded to oral clindamycin, cefuroxime and doxycycline.  No trauma to the area to her knowledge.  However, her husband states that he so her sandals straps looking a little tight before these happen.  She complains of mild nausea, but denies emesis, fever, chills, night sweats or fatigue.  She has clear rhinorrhea for the past few days.  No sore throat, wheezing, hemoptysis, dyspnea.  She denies chest pain, palpitations, dizziness, diaphoresis, PND or orthopnea.  She denies abdominal pain, diarrhea, melena or hematochezia.  She gets frequent constipation.  She denies dysuria, frequency or hematuria.  She denies polyuria, polydipsia, polyphagia or blurred vision.  No heat or cold intolerance.  Although has some difficulty with recent recall, she denies memory trouble.    Clinical Impression  Patient had difficulty requiring assistance to sit up at bedside due to generalized weakness, tolerated partial weight bearing (PWB) on RLE during sit to stands and gait training, tend to drift to the right when walking with occasional bumping into nearby objects resulting in fall risk.  Patient tolerated sitting up in chair after therapy - RN notified.  Patient will benefit from continued physical therapy in hospital and recommended venue below to increase strength, balance, endurance for safe ADLs and gait.  Patient may benefit from use of post-op shoe on right foot to protect wounds - RN notified.    Follow Up  Recommendations SNF;Supervision/Assistance - 24 hour    Equipment Recommendations  3in1 (PT)    Recommendations for Other Services        Precautions / Restrictions Precautions Precautions: Fall Restrictions Weight Bearing Restrictions: Yes RLE Weight Bearing: Touchdown weight bearing      Mobility  Bed Mobility Overal bed mobility: Needs Assistance Bed Mobility: Supine to Sit     Supine to sit: Min assist     General bed mobility comments: slow labored movement  Transfers Overall transfer level: Needs assistance Equipment used: Rolling walker (2 wheeled) Transfers: Sit to/from [employer] Sit to Stand: Min assist Stand pivot transfers: Min assist       General transfer comment: labored movement with limited weightbearing on RLE  Ambulation/Gait Ambulation/Gait assistance: Min assist Gait Distance (Feet): 30 Feet Assistive device: Rolling walker (2 wheeled) Gait Pattern/deviations: Decreased step length - right;Decreased stance time - right;Decreased stride length Gait velocity: decreased   General Gait Details: slow labored cadence with occasional drifting to the right, partial weightbearing (PWB) on right foot with pain at baseline, no loss of balance, limited secondary to c/o fatigue  Stairs            Wheelchair Mobility    Modified Rankin (Stroke Patients Only)       Balance Overall balance assessment: Needs assistance Sitting-balance support: Feet supported;No upper extremity supported Sitting balance-Leahy Scale: Good     Standing balance support: Bilateral upper extremity supported;During functional activity Standing balance-Leahy Scale: Fair Standing balance comment: using RW  Pertinent Vitals/Pain Pain Assessment: 0-10 Pain Score: 3  Pain Location: right foot at site of wounds Pain Descriptors / Indicators: Sore;Discomfort Pain Intervention(s): Limited activity within  patient's tolerance;Monitored during session    Home Living Family/patient expects to be discharged to:: Private residence Living Arrangements: Spouse/significant other Available Help at Discharge: Family Type of Home: House Home Access: Level entry     Home Layout: One level Home Equipment: Environmental consultant - 2 wheels;Walker - 4 wheels;Cane - single point      Prior Function Level of Independence: Independent with assistive device(s)         Comments: household and short distanced community ambulator with RW     Hand Dominance   Dominant Hand: Right    Extremity/Trunk Assessment   Upper Extremity Assessment Upper Extremity Assessment: Generalized weakness    Lower Extremity Assessment Lower Extremity Assessment: Generalized weakness;RLE deficits/detail RLE Deficits / Details: grossly 3+/5 except right foot/ankle -3/5    Cervical / Trunk Assessment Cervical / Trunk Assessment: Normal  Communication   Communication: No difficulties  Cognition Arousal/Alertness: Awake/alert Behavior During Therapy: WFL for tasks assessed/performed Overall Cognitive Status: Within Functional Limits for tasks assessed                                        General Comments      Exercises     Assessment/Plan    PT Assessment Patient needs continued PT services  PT Problem List Decreased strength;Decreased activity tolerance;Decreased mobility;Decreased balance       PT Treatment Interventions Gait training;Functional mobility training;Stair training;Therapeutic activities;Therapeutic exercise;Patient/family education    PT Goals (Current goals can be found in the Care Plan section)  Acute Rehab PT Goals Patient Stated Goal: return home after rehab PT Goal Formulation: With patient Time For Goal Achievement: 09/20/18 Potential to Achieve Goals: Good    Frequency Min 3X/week   Barriers to discharge        Co-evaluation               AM-PAC PT "6  Clicks" Daily Activity  Outcome Measure Difficulty turning over in bed (including adjusting bedclothes, sheets and blankets)?: A Little Difficulty moving from lying on back to sitting on the side of the bed? : A Little Difficulty sitting down on and standing up from a chair with arms (e.g., wheelchair, bedside commode, etc,.)?: A Little Help needed moving to and from a bed to chair (including a wheelchair)?: A Little Help needed walking in hospital room?: A Little Help needed climbing 3-5 steps with a railing? : A Lot 6 Click Score: 17    End of Session   Activity Tolerance: Patient tolerated treatment well;Patient limited by fatigue Patient left: in chair;with call bell/phone within reach Nurse Communication: Mobility status;Other (comment)(RN notified that patient left up in chair) PT Visit Diagnosis: Unsteadiness on feet (R26.81);Other abnormalities of gait and mobility (R26.89);Muscle weakness (generalized) (M62.81)    Time: 1610-9604 PT Time Calculation (min) (ACUTE ONLY): 31 min   Charges:   PT Evaluation $PT Eval Moderate Complexity: 1 Mod PT Treatments $Therapeutic Activity: 23-37 mins       12:21 PM, 09/06/18 Ocie Bob, MPT Physical Therapist with [facility] [phone] office [phone] mobile phone

## 2018-09-06 NOTE — NC FL2 (Signed)
[place] MEDICAID FL2 LEVEL OF CARE SCREENING TOOL     IDENTIFICATION  Patient Name: Holly Sparks Birthdate: 1941-05-15 Sex: female Admission Date (Current Location): 09/03/2018  [facility] and [place] Number:  [employer] and Address:  [facility],  [address] [place] [address]      Provider Number: [id number]  Attending Physician Name and Address:  Erick Blinks, MD  Relative Name and Phone Number:  Shanequa Whitenight (husband) [phone]    Current Level of Care: Hospital Recommended Level of Care: Skilled Nursing Facility Prior Approval Number:    Date Approved/Denied:   PASRR Number: [id number]  Discharge Plan: SNF    Current Diagnoses: Patient Active Problem List   Diagnosis Date Noted  . 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 Grade I diastolic dysfunction 09/03/2018  . Hypomagnesemia 09/03/2018  . Paroxysmal atrial fibrillation (HCC) 08/31/2015  . Chronic anticoagulation 08/31/2015  . Type 2 diabetes mellitus with circulatory disorder (HCC) 08/31/2015  . 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 Paroxysmal a-fib: Remote hx of afib 06/15/2015  . Cerebral infarction due to embolism of left middle cerebral artery (HCC)   . Essential hypertension   . Stroke-like symptoms 06/13/2015  . HLD (hyperlipidemia) 06/13/2015  . DDD (degenerative disc disease), cervical 06/13/2015  . Elevated serum creatinine 06/13/2015  . Aphasia 06/13/2015  . Expressive aphasia   . Cerebral infarction due to unspecified mechanism   . Type 2 diabetes mellitus without complication (HCC)   . IBS (irritable bowel syndrome) 04/24/2013  . High cholesterol 04/24/2013  . Diabetes (HCC) 04/24/2013  . Essential hypertension, benign 04/24/2013    Orientation RESPIRATION BLADDER Height & Weight     Self, Time, Situation, Place  Normal Continent Weight: 129 lb 10.1 oz (58.8 kg) Height:  5\' 7"  (170.2 cm)   BEHAVIORAL SYMPTOMS/MOOD NEUROLOGICAL BOWEL NUTRITION STATUS      Continent Diet(see dc summary)  AMBULATORY STATUS COMMUNICATION OF NEEDS Skin   Extensive Assist Verbally Other (Comment)(diabetic foot ulcer R foot)                       Personal Care Assistance Level of Assistance  Bathing, Feeding, Dressing Bathing Assistance: Limited assistance Feeding assistance: Independent Dressing Assistance: Limited assistance     Functional Limitations Info  Sight, Hearing, Speech Sight Info: Adequate Hearing Info: Adequate Speech Info: Adequate    SPECIAL CARE FACTORS FREQUENCY  PT (By licensed PT)     PT Frequency: 5 times week              Contractures Contractures Info: Not present    Additional Factors Info  Code Status, Allergies Code Status Info: Full Allergies Info: NKA           Current Medications (09/06/2018):  This is the current hospital active medication list Current Facility-Administered Medications  Medication Dose Route Frequency Provider Last Rate Last Dose  . 0.45 % sodium chloride infusion   Intravenous Continuous Bobette Mo, MD 88 mL/hr at 09/06/18 1028    . acetaminophen (TYLENOL) tablet 650 mg  650 mg Oral Q6H PRN Bobette Mo, MD   650 mg at 09/03/18 2255   Or  . acetaminophen (TYLENOL) suppository 650 mg  650 mg Rectal Q6H PRN Bobette Mo, MD      . apixaban Everlene Balls) tablet 5 mg  5 mg Oral BID Bobette Mo, MD   5 mg  at 09/06/18 0956  . atorvastatin (LIPITOR) tablet 20 mg  20 mg Oral QHS Bobette Mo, MD   20 mg at 09/05/18 2141  . cefTRIAXone (ROCEPHIN) 2 g in sodium chloride 0.9 % 100 mL IVPB  2 g Intravenous Q24H Bobette Mo, MD 200 mL/hr at 09/05/18 2146 2 g at 09/05/18 2146  . diltiazem (CARDIZEM CD) 24 hr capsule 120 mg  120 mg Oral Daily Bobette Mo, MD   120 mg at 09/06/18 0956  . feeding supplement (PRO-STAT SUGAR FREE 64) liquid 30 mL  30 mL Oral BID Erick Blinks, MD   30 mL  at 09/06/18 0952  . lisinopril (PRINIVIL,ZESTRIL) tablet 20 mg  20 mg Oral Daily Bobette Mo, MD   20 mg at 09/06/18 1610   And  . hydrochlorothiazide (MICROZIDE) capsule 12.5 mg  12.5 mg Oral Daily Bobette Mo, MD   12.5 mg at 09/06/18 0956  . insulin aspart (novoLOG) injection 0-9 Units  0-9 Units Subcutaneous TID WC Bobette Mo, MD   1 Units at 09/04/18 1658  . metFORMIN (GLUCOPHAGE) tablet 500 mg  500 mg Oral BID WC Bobette Mo, MD   500 mg at 09/06/18 0954  . metoprolol tartrate (LOPRESSOR) tablet 12.5 mg  12.5 mg Oral BID Bobette Mo, MD   12.5 mg at 09/06/18 0954  . nutrition supplement (JUVEN) (JUVEN) powder packet 1 packet  1 packet Oral BID BM Erick Blinks, MD   1 packet at 09/06/18 [phone]  . ondansetron (ZOFRAN) tablet 4 mg  4 mg Oral Q6H PRN Bobette Mo, MD       Or  . ondansetron [facility]) injection 4 mg  4 mg Intravenous Q6H PRN Bobette Mo, MD      . vancomycin [facility]) IVPB 750 mg/150 ml premix  750 mg Intravenous Q24H Bobette Mo, MD 150 mL/hr at 09/05/18 2327 750 mg at 09/05/18 2327     Discharge Medications: Please see discharge summary for a list of discharge medications.  Relevant Imaging Results:  Relevant Lab Results:   Additional Information SSN: [id number] LCSW

## 2018-09-06 NOTE — Plan of Care (Signed)
  Problem: Acute Rehab PT Goals(only PT should resolve) Goal: Pt Will Go Supine/Side To Sit Outcome: Progressing Flowsheets (Taken 09/06/2018 1226) Pt will go Supine/Side to Sit: with min guard assist Goal: Patient Will Transfer Sit To/From Stand Outcome: Progressing Flowsheets (Taken 09/06/2018 1226) Patient will transfer sit to/from stand: with min guard assist Goal: Pt Will Transfer Bed To Chair/Chair To Bed Outcome: Progressing Flowsheets (Taken 09/06/2018 1226) Pt will Transfer Bed to Chair/Chair to Bed: min guard assist Goal: Pt Will Ambulate Outcome: Progressing Flowsheets (Taken 09/06/2018 1226) Pt will Ambulate: 50 feet; with min guard assist; with rolling walker   12:26 PM, 09/06/18 Ocie Bob, MPT Physical Therapist with [facility] [phone] office [phone] mobile phone

## 2018-09-06 NOTE — Progress Notes (Signed)
Late entry:  Dressings changed per order.  Patient tolerated well.

## 2018-09-07 ENCOUNTER — Non-Acute Institutional Stay (SKILLED_NURSING_FACILITY): Payer: Medicare Other | Admitting: Internal Medicine

## 2018-09-07 ENCOUNTER — Encounter: Payer: Self-pay | Admitting: Internal Medicine

## 2018-09-07 ENCOUNTER — Inpatient Hospital Stay
Admission: RE | Admit: 2018-09-07 | Discharge: 2018-09-26 | Disposition: A | Discharge status: Home / Self Care | Payer: Medicare Other | Source: Ambulatory Visit | Attending: Internal Medicine | Admitting: Internal Medicine

## 2018-09-07 DIAGNOSIS — E11621 Type 2 diabetes mellitus with foot ulcer: Secondary | ICD-10-CM

## 2018-09-07 DIAGNOSIS — I10 Essential (primary) hypertension: Secondary | ICD-10-CM | POA: Diagnosis not present

## 2018-09-07 DIAGNOSIS — E1159 Type 2 diabetes mellitus with other circulatory complications: Secondary | ICD-10-CM

## 2018-09-07 DIAGNOSIS — L97519 Non-pressure chronic ulcer of other part of right foot with unspecified severity: Secondary | ICD-10-CM

## 2018-09-07 DIAGNOSIS — E785 Hyperlipidemia, unspecified: Secondary | ICD-10-CM

## 2018-09-07 DIAGNOSIS — I480 Paroxysmal atrial fibrillation: Secondary | ICD-10-CM | POA: Diagnosis not present

## 2018-09-07 LAB — GLUCOSE, CAPILLARY
GLUCOSE-CAPILLARY: 153 mg/dL — AB (ref 70–99)
Glucose-Capillary: 100 mg/dL — ABNORMAL HIGH (ref 70–99)

## 2018-09-07 LAB — CBC WITH DIFFERENTIAL/PLATELET
Abs Immature Granulocytes: 0.01 10*3/uL (ref 0.00–0.07)
Basophils Absolute: 0 10*3/uL (ref 0.0–0.1)
Basophils Relative: 1 %
EOS ABS: 0.1 10*3/uL (ref 0.0–0.5)
EOS PCT: 2 %
HEMATOCRIT: 35.7 % — AB (ref 36.0–46.0)
HEMOGLOBIN: 11.3 g/dL — AB (ref 12.0–15.0)
Immature Granulocytes: 0 %
Lymphocytes Relative: 32 %
Lymphs Abs: 1.4 10*3/uL (ref 0.7–4.0)
MCH: 29.4 pg (ref 26.0–34.0)
MCHC: 31.7 g/dL (ref 30.0–36.0)
MCV: 93 fL (ref 80.0–100.0)
MONO ABS: 0.3 10*3/uL (ref 0.1–1.0)
Monocytes Relative: 8 %
NRBC: 0 % (ref 0.0–0.2)
Neutro Abs: 2.5 10*3/uL (ref 1.7–7.7)
Neutrophils Relative %: 57 %
Platelets: 206 10*3/uL (ref 150–400)
RBC: 3.84 MIL/uL — ABNORMAL LOW (ref 3.87–5.11)
RDW: 13.3 % (ref 11.5–15.5)
WBC: 4.4 10*3/uL (ref 4.0–10.5)

## 2018-09-07 LAB — BASIC METABOLIC PANEL
Anion gap: 8 (ref 5–15)
BUN: 20 mg/dL (ref 8–23)
CALCIUM: 9.1 mg/dL (ref 8.9–10.3)
CHLORIDE: 105 mmol/L (ref 98–111)
CO2: 26 mmol/L (ref 22–32)
CREATININE: 0.85 mg/dL (ref 0.44–1.00)
GFR calc Af Amer: >60 mL/min (ref >60)
GFR calc non Af Amer: >60 mL/min (ref >60)
GLUCOSE: 108 mg/dL — AB (ref 70–99)
Potassium: 3.7 mmol/L (ref 3.5–5.1)
Sodium: 139 mmol/L (ref 135–145)

## 2018-09-07 MED ORDER — DOXYCYCLINE HYCLATE 100 MG PO TABS: 100.0000 mg | ORAL_TABLET | Freq: Two times a day (BID) | ORAL | Status: DC

## 2018-09-07 NOTE — Progress Notes (Signed)
IV removed, patient tolerated well.  Report called to [facility] at [facility] and all questions answered.  Patient to be discharged to [facility] room 132.

## 2018-09-07 NOTE — Clinical Social Work Note (Signed)
CSW following. Spoke with Lynnea Ferrier at [facility] and insurance has not yet authorized. Will await authorization decision and further assist with pt's dc needs once received.

## 2018-09-07 NOTE — Care Management Important Message (Signed)
Important Message  Patient Details  Name: Holly Sparks MRN: [id number] Date of Birth: 16-Oct-1941   Medicare Important Message Given:  Yes    Renie Ora 09/07/2018, 10:36 AM

## 2018-09-07 NOTE — Discharge Summary (Signed)
Physician Discharge Summary  Holly Sparks ZOX:[id number] DOB: Apr 09, 1941 DOA: 09/03/2018  PCP: Gareth Morgan, MD  Admit date: 09/03/2018 Discharge date: 09/07/2018  Admitted From: Home Disposition: Skilled nursing facility  Recommendations for Outpatient Follow-up:  1. Follow up with PCP in 1-2 weeks  Discharge Condition: Stable CODE STATUS: Full code Diet recommendation: Heart healthy, carb modified  Brief/Interim Summary: 77 year old female admitted to the hospital with a right foot wound that is been present for several weeks.  Patient had noted large blistering of the right foot.  Her husband reports that she recently started wearing sandals that had tight straps.  He feels that these may have led to the wounds on her foot due to friction.  She has surrounding erythema of her wounds with clear discharge.  She was admitted to the hospital for IV antibiotic therapy.  Discharge Diagnoses:  Principal Problem:   Diabetic ulcer of right foot due to type 2 diabetes mellitus (HCC) Active Problems:   Essential hypertension, benign   HLD (hyperlipidemia)   Essential hypertension   Paroxysmal atrial fibrillation (HCC)   Type 2 diabetes mellitus with circulatory disorder (HCC)   Grade I diastolic dysfunction   Hypomagnesemia  1. Wounds on right foot complicated with diabetes.  MRI of the foot did not show any evidence of osteomyelitis.  Arterial Doppler performed showed ABIs greater than 1 bilaterally.  She was treated with intravenous antibiotics and wound care.  Wound care recommendations are listed as below.  Wounds have been slowly improving.  Erythema improving.  She is been transitioned to oral antibiotics.  Will need continued wound care. 2. Diabetes.  Resume metformin on discharge.  A1c of 8. 3. Paroxysmal atrial fibrillation.  Anticoagulated with Eliquis.  Continued on diltiazem metoprolol. 4. Hypertension.  Blood pressure currently stable.  Continue Cardizem, lisinopril,  metoprolol and hydrochlorothiazide. 5. Hyperlipidemia.  Continue statin  Discharge Instructions  Discharge Instructions    Diet - low sodium heart healthy   Complete by:  As directed    Increase activity slowly   Complete by:  As directed      Allergies as of 09/07/2018   No Known Allergies     Medication List    STOP taking these medications   cefUROXime 500 MG tablet Commonly known as:  CEFTIN   clindamycin 300 MG capsule Commonly known as:  CLEOCIN     TAKE these medications   apixaban 5 MG Tabs tablet Commonly known as:  ELIQUIS Take 1 tablet (5 mg total) by mouth 2 (two) times daily.   atorvastatin 20 MG tablet Commonly known as:  LIPITOR Take 1 tablet (20 mg total) by mouth at bedtime.   diltiazem 120 MG 24 hr capsule Commonly known as:  CARDIZEM CD Take 1 capsule (120 mg total) by mouth daily.   doxycycline 100 MG tablet Commonly known as:  VIBRA-TABS Take 1 tablet (100 mg total) by mouth 2 (two) times daily. For 5 more days What changed:  additional instructions   lisinopril-hydrochlorothiazide 20-12.5 MG tablet Commonly known as:  PRINZIDE,ZESTORETIC Take 1 tablet by mouth daily.   metFORMIN 1000 MG tablet Commonly known as:  GLUCOPHAGE Take 500 mg by mouth 2 (two) times daily with a meal.   metoprolol tartrate 25 MG tablet Commonly known as:  LOPRESSOR TAKE ONE-HALF TABLET BY MOUTH TWICE A DAY   pantoprazole 40 MG tablet Commonly known as:  PROTONIX Take 40 mg by mouth daily.      Contact information for after-discharge care  Destination    [facility] Preferred SNF .   Service:  Skilled Nursing Contact information: [address] [place] [address] [phone]             No Known Allergies  Consultations:  Wound care:Cleanse blisters to bilateral feet with NS. Apply Xeroform to intact and nonintact blisters.  Cover with gauze and kerlix.  Change daily.   Procedures/Studies: Dg Chest 2  View  Result Date: 09/03/2018 CLINICAL DATA:  Foot wound for several weeks with drainage, initial encounter EXAM: CHEST - 2 VIEW COMPARISON:  01/21/2018 FINDINGS: Cardiac shadows within normal limits. The lungs are well aerated bilaterally. No focal infiltrate or sizable effusion is seen. No acute bony abnormality is noted. Degenerative changes of the thoracic spine are noted. IMPRESSION: No active cardiopulmonary disease. Electronically Signed   By: Alcide Clever M.D.   On: 09/03/2018 18:45   Dg Ankle Complete Right  Result Date: 09/03/2018 CLINICAL DATA:  Foot and ankle pain and redness.  Blisters. EXAM: RIGHT ANKLE - COMPLETE 3+ VIEW COMPARISON:  None. FINDINGS: No fracture or bone lesion. No bone resorption to suggest osteomyelitis. Ankle joint normally spaced and aligned.  No arthropathic changes. Moderate plantar calcaneal spur. Mild diffuse soft tissue swelling. IMPRESSION: No fracture, ankle joint abnormality or evidence of osteomyelitis. Electronically Signed   By: Amie Portland M.D.   On: 09/03/2018 20:56   Mr Foot Right W Wo Contrast  Result Date: 09/04/2018 CLINICAL DATA:  Foot swelling, wound on the foot for several weeks EXAM: MRI OF THE RIGHT FOREFOOT WITHOUT AND WITH CONTRAST TECHNIQUE: Multiplanar, multisequence MR imaging of the right forefoot was performed before and after the administration of intravenous contrast. CONTRAST:  5 mL Gadavist COMPARISON:  None. FINDINGS: Bones/Joint/Cartilage No marrow signal abnormality. No fracture or dislocation. Normal alignment. No joint effusion. No periosteal reaction or bone destruction. No aggressive osseous lesion. Mild osteoarthritis of the first MTP joint. Ligaments Collateral ligaments are intact.  Lisfranc ligament is intact. Muscles and Tendons Flexor, peroneal and extensor compartment tendons are intact. Achilles tendon is intact. Plantar fascia is intact. Muscles are normal. Soft tissue Fluid-filled skin blister along the dorsal aspect of  the great toe measuring 1 x 2.7 x 2.4 cm. No peripheral enhancement. Fluid-filled skin blister along the posterolateral aspect of the hindfoot measuring 1 x 2.9 x 3.1 cm without enhancement. No soft tissue mass. IMPRESSION: 1. No osteomyelitis of the right foot. 2. Fluid-filled skin blisters along the dorsal aspect of the great toe and posterolateral hindfoot Electronically Signed   By: Elige Ko   On: 09/04/2018 11:44   US Arterial Abi (screening Lower Extremity)  Result Date: 09/04/2018 CLINICAL DATA:  Multiple large blisters/ulcerations on the right foot. EXAM: NONINVASIVE PHYSIOLOGIC VASCULAR STUDY OF BILATERAL LOWER EXTREMITIES TECHNIQUE: Evaluation of both lower extremities were performed at rest, including calculation of ankle-brachial indices with single level Doppler, pressure and pulse volume recording. COMPARISON:  None. FINDINGS: Right ABI:  1.08 Left ABI:  1.06 Right Lower Extremity: Irregular waveforms are likely associated with a dysrhythmia. Left Lower Extremity: Irregular waveforms are likely associated with a dysrhythmia. IMPRESSION: Normal ankle-brachial indices. Irregular waveforms are likely associated with a dysrhythmia. Electronically Signed   By: Richarda Overlie M.D.   On: 09/04/2018 10:24   Dg Foot Complete Right  Result Date: 09/03/2018 CLINICAL DATA:  Blisters at Right big toe, right heel, across top of right foot, some blisters have burst, wounds have been on right foot for weeks,  pain EXAM: RIGHT FOOT COMPLETE - 3+ VIEW COMPARISON:  None. FINDINGS: No fracture. No bone lesion. There are no areas of bone resorption to suggest osteomyelitis. Mild first metatarsophalangeal joint osteoarthritis. Remaining joints normally spaced and aligned. Moderate plantar calcaneal spur. Mild dorsal soft tissue swelling. IMPRESSION: No fracture or dislocation.  No evidence of osteomyelitis. Electronically Signed   By: Amie Portland M.D.   On: 09/03/2018 20:55       Subjective: Reports  improvement of pain in her foot.  No shortness of breath.  Discharge Exam: Vitals:   09/06/18 2116 09/06/18 2233 09/07/18 0606 09/07/18 1401  BP: 123/88  140/74 (!) 88/58  Pulse: (!) 103  87 62  Resp:      Temp: 97.7 F (36.5 C)  97.9 F (36.6 C)   TempSrc: Oral  Oral   SpO2: (!) 82% 100% 99% 99%  Weight:      Height:        General: Pt is alert, awake, not in acute distress Cardiovascular: RRR, S1/S2 +, no rubs, no gallops Respiratory: CTA bilaterally, no wheezing, no rhonchi Abdominal: Soft, NT, ND, bowel sounds + Extremities: Improving wounds in the right foot.  Erythema resolving.  Clear drainage noted from wounds.    The results of significant diagnostics from this hospitalization (including imaging, microbiology, ancillary and laboratory) are listed below for reference.     Microbiology: Recent Results (from the past 240 hour(s))  Blood culture (routine x 2)     Status: None (Preliminary result)   Collection Time: 09/03/18  7:38 PM  Result Value Ref Range Status   Specimen Description BLOOD RIGHT FOREARM  Final   Special Requests   Final    BOTTLES DRAWN AEROBIC AND ANAEROBIC Blood Culture adequate volume   Culture   Final    NO GROWTH 4 DAYS Performed at [facility], [address] [place], [place] [address]    Report Status PENDING  Incomplete  Blood culture (routine x 2)     Status: None (Preliminary result)   Collection Time: 09/03/18  7:38 PM  Result Value Ref Range Status   Specimen Description LEFT ANTECUBITAL  Final   Special Requests   Final    BOTTLES DRAWN AEROBIC AND ANAEROBIC Blood Culture adequate volume   Culture   Final    NO GROWTH 4 DAYS Performed at [facility], [address], [place], [place] [address]    Report Status PENDING  Incomplete     Labs: BNP (last 3 results) Recent Labs    04/20/18 1219  BNP 241.0*   Basic Metabolic Panel: Recent Labs  Lab 09/03/18 1937 09/03/18 2143 09/04/18 0516 09/05/18 0516 09/06/18 0539  09/07/18 0528  NA 137  --  138 136 138 139  K 3.7  --  3.8 3.5 3.8 3.7  CL 101  --  103 103 104 105  CO2 25  --  26 25 26 26   GLUCOSE 165*  --  137* 106* 110* 108*  BUN 23  --  20 23 22 20   CREATININE 0.99  --  1.00 0.89 0.86 0.85  CALCIUM 9.2  --  8.7* 8.6* 8.9 9.1  MG  --  1.3*  --   --   --   --   PHOS  --  3.2  --   --   --   --    Liver Function Tests: Recent Labs  Lab 09/03/18 1937  AST 15  ALT 9  ALKPHOS 59  BILITOT  1.0  PROT 7.8  ALBUMIN 4.0   No results for input(s): LIPASE, AMYLASE in the last 168 hours. No results for input(s): AMMONIA in the last 168 hours. CBC: Recent Labs  Lab 09/03/18 1937 09/04/18 0516 09/05/18 0516 09/06/18 0539 09/07/18 0528  WBC 7.0 5.4 5.0 4.9 4.4  NEUTROABS 4.9 3.2 3.4 2.9 2.5  HGB 12.5 11.1* 9.9* 10.7* 11.3*  HCT 37.9 33.8* 31.0* 33.3* 35.7*  MCV 90.9 90.9 91.4 92.8 93.0  PLT 211 191 183 183 206   Cardiac Enzymes: No results for input(s): CKTOTAL, CKMB, CKMBINDEX, TROPONINI in the last 168 hours. BNP: Invalid input(s): POCBNP CBG: Recent Labs  Lab 09/06/18 1125 09/06/18 1624 09/06/18 2117 09/07/18 0742 09/07/18 1136  GLUCAP 154* 92 121* 100* 153*   D-Dimer No results for input(s): DDIMER in the last 72 hours. Hgb A1c No results for input(s): HGBA1C in the last 72 hours. Lipid Profile No results for input(s): CHOL, HDL, LDLCALC, TRIG, CHOLHDL, LDLDIRECT in the last 72 hours. Thyroid function studies No results for input(s): TSH, T4TOTAL, T3FREE, THYROIDAB in the last 72 hours.  Invalid input(s): FREET3 Anemia work up No results for input(s): VITAMINB12, FOLATE, FERRITIN, TIBC, IRON, RETICCTPCT in the last 72 hours. Urinalysis    Component Value Date/Time   COLORURINE YELLOW 06/13/2015 1141   APPEARANCEUR CLEAR 06/13/2015 1141   LABSPEC 1.023 06/13/2015 1141   PHURINE 5.5 06/13/2015 1141   GLUCOSEU >1000 (A) 06/13/2015 1141   HGBUR NEGATIVE 06/13/2015 1141   BILIRUBINUR NEGATIVE 06/13/2015 1141    KETONESUR 15 (A) 06/13/2015 1141   PROTEINUR NEGATIVE 06/13/2015 1141   UROBILINOGEN 0.2 06/13/2015 1141   NITRITE NEGATIVE 06/13/2015 1141   LEUKOCYTESUR NEGATIVE 06/13/2015 1141   Sepsis Labs Invalid input(s): PROCALCITONIN,  WBC,  LACTICIDVEN Microbiology Recent Results (from the past 240 hour(s))  Blood culture (routine x 2)     Status: None (Preliminary result)   Collection Time: 09/03/18  7:38 PM  Result Value Ref Range Status   Specimen Description BLOOD RIGHT FOREARM  Final   Special Requests   Final    BOTTLES DRAWN AEROBIC AND ANAEROBIC Blood Culture adequate volume   Culture   Final    NO GROWTH 4 DAYS Performed at [facility], [address] [place], [place] [address]    Report Status PENDING  Incomplete  Blood culture (routine x 2)     Status: None (Preliminary result)   Collection Time: 09/03/18  7:38 PM  Result Value Ref Range Status   Specimen Description LEFT ANTECUBITAL  Final   Special Requests   Final    BOTTLES DRAWN AEROBIC AND ANAEROBIC Blood Culture adequate volume   Culture   Final    NO GROWTH 4 DAYS Performed at [facility], [address], [place], [place] [address]    Report Status PENDING  Incomplete     Time coordinating discharge:  SIGNED:   Erick Blinks, MD  [facility] 09/07/2018, 2:33 PM Pager   If 7PM-7AM, please contact night-coverage [email or web address] Password [email or web address]

## 2018-09-07 NOTE — Clinical Social Work Placement (Signed)
   CLINICAL SOCIAL WORK PLACEMENT  NOTE  Date:  09/07/2018  Patient Details  Name: Holly Sparks MRN: [id number] Date of Birth: [birth year]  Clinical Social Work is seeking post-discharge placement for this patient at the   level of care (*CSW will initial, date and re-position this form in  chart as items are completed):  Yes   Patient/family provided with [facility] Clinical Social Work Department's list of facilities offering this level of care within the geographic area requested by the patient (or if unable, by the patient's family).  Yes   Patient/family informed of their freedom to choose among providers that offer the needed level of care, that participate in Medicare, Medicaid or managed care program needed by the patient, have an available bed and are willing to accept the patient.  Yes   Patient/family informed of [facility]'s ownership interest in [facility] and [facility], as well as of the fact that they are under no obligation to receive care at these facilities.  PASRR submitted to EDS on 09/06/18     PASRR number received on 09/06/18     Existing PASRR number confirmed on       FL2 transmitted to all facilities in geographic area requested by pt/family on 09/06/18     FL2 transmitted to all facilities within larger geographic area on       Patient informed that his/her managed care company has contracts with or will negotiate with certain facilities, including the following:        Yes   Patient/family informed of bed offers received.  Patient chooses bed at [facility]     Physician recommends and patient chooses bed at      Patient to be transferred to [facility] on  .  Patient to be transferred to facility by wc     Patient family notified on 09/07/18 of transfer.  Name of family member notified:  Spouse, Mr. Bonds     PHYSICIAN       Additional Comment:  Patient going to room 132. Discharge clinicals sent. LCSW  signing off.   _______________________________________________ Annice Needy, LCSW 09/07/2018, 3:01 PM

## 2018-09-07 NOTE — Progress Notes (Signed)
Location:    [facility] Room Number: 132/P Place of Service:  SNF [phone]) Provider: Bayard Males, MD  Patient Care Team: Gareth Morgan, MD as PCP - General [facility] Medicine)  Extended Emergency Contact Information Primary Emergency Contact: Kornegay,Bobby Address: [address]          [place], [place] [address] D[place] of [place] Home Phone: [phone] Mobile Phone: [phone] Relation: Spouse  Code Status:  Full Code Goals of care: Advanced Directive information Advanced Directives 09/07/2018  Does Patient Have a Medical Advance Directive? Yes  Type of Advance Directive (No Data)  Does patient want to make changes to medical advance directive? No - Patient declined  Would patient like information on creating a medical advance directive? No - Patient declined  Pre-existing out of facility DNR order (yellow form or pink MOST form) -     Chief Complaint  Patient presents with  . Hospitalization Follow-up    Hospitalization f/u visit  For a right foot wound  HPI:  Pt is a 77 y.o. female seen today for follow-up of hospitalization for right foot wound. Patient is a type II diabetic and had noted a large blistering of the right foot- it was thought this possibly was secondary to new sandals that she had been wearing.  There was increasing erythema discharge she was admitted to the hospital for IV antibiotics.  MRI of the foot was negative for any osteomyelitis Dopplers showed ABIs greater than 1 bilaterally.  She was treated with IV antibiotics and wound care- wound is slowly improved- she is now on oral antibiotic and will obviously need continued wound care.  Regards to type 2 diabetes Glucophage was resumed hemoglobin A1c was 8 in the hospital.  She also has a history of atrial fibrillation she is on Eliquis is on diltiazem and metoprolol for rate control.  She also has a history of hypertension again is on Cardizem  metoprolol in addition to hydrochlorothiazide and lisinopril.  Regards her hyperlipidemia she is on a statin.  Currently she is resting in bed comfortably vital signs appear to be stable   pressure   Past Medical History:  Diagnosis Date  . Diabetes (HCC)    Type 2 x 10 yrs  . Dysrhythmia    AFib  . GERD (gastroesophageal reflux disease)   . Grade I diastolic dysfunction 09/03/2018  . High cholesterol   . Hypertension    x 10 yrs  . Paroxysmal a-fib: Remote hx of afib 06/15/2015  . Stroke [facility])    no deficits   Past Surgical History:  Procedure Laterality Date  . CATARACT EXTRACTION W/PHACO Left 06/06/2016   Procedure: CATARACT EXTRACTION PHACO AND INTRAOCULAR LENS PLACEMENT (IOC);  Surgeon: Gemma Payor, MD;  Location: [facility] ORS;  Service: Ophthalmology;  Laterality: Left;  CDE: 8.32  . COLONOSCOPY    . COLONOSCOPY N/A 05/08/2013   Procedure: COLONOSCOPY;  Surgeon: Malissa Hippo, MD;  Location: [facility] ENDO SUITE;  Service: Endoscopy;  Laterality: N/A;  1015  . NECK SURGERY     x 2 for spur and disc problems    No Known Allergies  Allergies as of 09/07/2018   No Known Allergies     Medication List        Accurate as of 09/07/18  4:27 PM. Always use your most recent med list.          apixaban 5 MG Tabs tablet Commonly known as:  ELIQUIS Take 1 tablet (  5 mg total) by mouth 2 (two) times daily.   atorvastatin 20 MG tablet Commonly known as:  LIPITOR Take 1 tablet (20 mg total) by mouth at bedtime.   diltiazem 120 MG 24 hr capsule Commonly known as:  CARDIZEM CD Take 1 capsule (120 mg total) by mouth daily.   doxycycline 100 MG tablet Commonly known as:  VIBRA-TABS Take 1 tablet (100 mg total) by mouth 2 (two) times daily. For 5 more days   lisinopril-hydrochlorothiazide 20-12.5 MG tablet Commonly known as:  PRINZIDE,ZESTORETIC Take 1 tablet by mouth daily.   metFORMIN 1000 MG tablet Commonly known as:  GLUCOPHAGE Take 500 mg by mouth 2 (two) times daily with a  meal.   metoprolol tartrate 25 MG tablet Commonly known as:  LOPRESSOR Take 12.5 mg by mouth 2 (two) times daily.   pantoprazole 40 MG tablet Commonly known as:  PROTONIX Take 40 mg by mouth daily.       Review of Systems   General she is not complaining of any fever or chills.  Skin does have the significant right foot wound that is currently covered-.  Does not complain of rashes or itching.  Head ears eyes nose mouth and throat is not complaining of a sore throat or visual changes.  Respiratory is not complaining of shortness of breath or cough.  Cardiac does not complain of chest pain or significant lower extremity edema.  GI is not complaining of nausea vomiting diarrhea constipation or abdominal pain at this time.  Musculoskeletal has some weakness but is not really complaining of any acute musculoskeletal pain or foot pain.  Neurologic is not complaining of dizziness headache numbness does have weakness.  Psych does not complain of being overtly depressed appears a little bit anxious about her foot  Immunization History  Administered Date(s) Administered  . Influenza, High Dose Seasonal PF 09/04/2018  . Tdap 09/03/2018   Pertinent  Health Maintenance Due  Topic Date Due  . FOOT EXAM  10/08/2018 (Originally 12/20/1950)  . OPHTHALMOLOGY EXAM  10/08/2018 (Originally 12/20/1950)  . PNA vac Low Risk Adult (1 of 2 - PCV13) 10/08/2018 (Originally 12/19/2006)  . HEMOGLOBIN A1C  03/05/2019  . INFLUENZA VACCINE  Completed  . DEXA SCAN  Completed   Fall Risk  08/31/2015 08/14/2015  Falls in the past year? No Yes  Injury with Fall? No No  Risk for fall due to : - Impaired balance/gait   Functional Status Survey:    She is afebrile respirations of 18 blood pressure 116/56 manually pulse is 64 Physical Exam   In general this is a pleasant elderly female in no distress resting comfortably in bed.  Her skin is warm and dry she does have covering over her right  foot-there is a blister and appears fluid-filled on her right great toe-.  She also has a small abrasion on the top of her left foot this does not appear to be infected or cellulitic.  Eyes visual acuity appears to be intact sclera and conjunctive are clear.  Oropharynx is clear mucous membranes moist.  Chest is clear to auscultation there is no labored breathing.  Heart is irregular irregular rate and rhythm without murmur gallop or rub she does not have significant lower extremity edema.  Her abdomen is soft nontender with positive bowel sounds.  Musculoskeletal Limited exam since she is in bed but appears able to move all extremities x4 again right wound is currently covered-.  Neurologic is grossly intact her speech is clear  could not really appreciate lateralizing findings.  Psych she appears alert and oriented pleasant and appropriate  Labs reviewed: Recent Labs    09/03/18 2143  09/05/18 0516 09/06/18 0539 09/07/18 0528  NA  --    < > 136 138 139  K  --    < > 3.5 3.8 3.7  CL  --    < > 103 104 105  CO2  --    < > 25 26 26   GLUCOSE  --    < > 106* 110* 108*  BUN  --    < > 23 22 20   CREATININE  --    < > 0.89 0.86 0.85  CALCIUM  --    < > 8.6* 8.9 9.1  MG 1.3*  --   --   --   --   PHOS 3.2  --   --   --   --    < > = values in this interval not displayed.   Recent Labs    01/21/18 1217 04/20/18 1219 09/03/18 1937  AST 20 18 15   ALT 10* 10* 9  ALKPHOS 69 63 59  BILITOT 0.8 1.3* 1.0  PROT 7.2 7.3 7.8  ALBUMIN 3.8 3.8 4.0   Recent Labs    09/05/18 0516 09/06/18 0539 09/07/18 0528  WBC 5.0 4.9 4.4  NEUTROABS 3.4 2.9 2.5  HGB 9.9* 10.7* 11.3*  HCT 31.0* 33.3* 35.7*  MCV 91.4 92.8 93.0  PLT 183 183 206    Lab Results  Component Value Date   HGBA1C 8.0 (H) 09/03/2018   Lab Results  Component Value Date   CHOL 135 06/14/2015   HDL 28 (L) 06/14/2015   LDLCALC 70 06/14/2015   TRIG 185 (H) 06/14/2015   CHOLHDL 4.8 06/14/2015    Significant  Diagnostic Results in last 30 days:  Dg Chest 2 View  Result Date: 09/03/2018 CLINICAL DATA:  Foot wound for several weeks with drainage, initial encounter EXAM: CHEST - 2 VIEW COMPARISON:  01/21/2018 FINDINGS: Cardiac shadows within normal limits. The lungs are well aerated bilaterally. No focal infiltrate or sizable effusion is seen. No acute bony abnormality is noted. Degenerative changes of the thoracic spine are noted. IMPRESSION: No active cardiopulmonary disease. Electronically Signed   By: Alcide Clever M.D.   On: 09/03/2018 18:45   Dg Ankle Complete Right  Result Date: 09/03/2018 CLINICAL DATA:  Foot and ankle pain and redness.  Blisters. EXAM: RIGHT ANKLE - COMPLETE 3+ VIEW COMPARISON:  None. FINDINGS: No fracture or bone lesion. No bone resorption to suggest osteomyelitis. Ankle joint normally spaced and aligned.  No arthropathic changes. Moderate plantar calcaneal spur. Mild diffuse soft tissue swelling. IMPRESSION: No fracture, ankle joint abnormality or evidence of osteomyelitis. Electronically Signed   By: Amie Portland M.D.   On: 09/03/2018 20:56   Mr Foot Right W Wo Contrast  Result Date: 09/04/2018 CLINICAL DATA:  Foot swelling, wound on the foot for several weeks EXAM: MRI OF THE RIGHT FOREFOOT WITHOUT AND WITH CONTRAST TECHNIQUE: Multiplanar, multisequence MR imaging of the right forefoot was performed before and after the administration of intravenous contrast. CONTRAST:  5 mL Gadavist COMPARISON:  None. FINDINGS: Bones/Joint/Cartilage No marrow signal abnormality. No fracture or dislocation. Normal alignment. No joint effusion. No periosteal reaction or bone destruction. No aggressive osseous lesion. Mild osteoarthritis of the first MTP joint. Ligaments Collateral ligaments are intact.  Lisfranc ligament is intact. Muscles and Tendons Flexor, peroneal and extensor compartment tendons are intact. Achilles  tendon is intact. Plantar fascia is intact. Muscles are normal. Soft tissue  Fluid-filled skin blister along the dorsal aspect of the great toe measuring 1 x 2.7 x 2.4 cm. No peripheral enhancement. Fluid-filled skin blister along the posterolateral aspect of the hindfoot measuring 1 x 2.9 x 3.1 cm without enhancement. No soft tissue mass. IMPRESSION: 1. No osteomyelitis of the right foot. 2. Fluid-filled skin blisters along the dorsal aspect of the great toe and posterolateral hindfoot Electronically Signed   By: Elige Ko   On: 09/04/2018 11:44   US Arterial Abi (screening Lower Extremity)  Result Date: 09/04/2018 CLINICAL DATA:  Multiple large blisters/ulcerations on the right foot. EXAM: NONINVASIVE PHYSIOLOGIC VASCULAR STUDY OF BILATERAL LOWER EXTREMITIES TECHNIQUE: Evaluation of both lower extremities were performed at rest, including calculation of ankle-brachial indices with single level Doppler, pressure and pulse volume recording. COMPARISON:  None. FINDINGS: Right ABI:  1.08 Left ABI:  1.06 Right Lower Extremity: Irregular waveforms are likely associated with a dysrhythmia. Left Lower Extremity: Irregular waveforms are likely associated with a dysrhythmia. IMPRESSION: Normal ankle-brachial indices. Irregular waveforms are likely associated with a dysrhythmia. Electronically Signed   By: Richarda Overlie M.D.   On: 09/04/2018 10:24   Dg Foot Complete Right  Result Date: 09/03/2018 CLINICAL DATA:  Blisters at Right big toe, right heel, across top of right foot, some blisters have burst, wounds have been on right foot for weeks, pain EXAM: RIGHT FOOT COMPLETE - 3+ VIEW COMPARISON:  None. FINDINGS: No fracture. No bone lesion. There are no areas of bone resorption to suggest osteomyelitis. Mild first metatarsophalangeal joint osteoarthritis. Remaining joints normally spaced and aligned. Moderate plantar calcaneal spur. Mild dorsal soft tissue swelling. IMPRESSION: No fracture or dislocation.  No evidence of osteomyelitis. Electronically Signed   By: Amie Portland M.D.   On:  09/03/2018 20:55    Assessment/Plan  #1 right foot wound-again she will need extensive wound care I have spoken with the wound care nurse about this- is status post IV antibiotics continues on doxycycline through October 16.  2.  Hypertension at this point appears trolled will continue to monitor continues on numerous medications including diltiazem lisinopril hydrochlorothiazide and Lopressor    3-- type 2 diabetes she has been restarted on Glucophage point will monitor CBGs globin A1c in hospital was 8.  4.  History of atrial fibrillation this appears rate controlled she is on Eliquis for anticoagulation diltiazem and Lopressor for rate control.  5 history of hyperlipidemia she does continue on statin.  6.  History of GERD she continues on Protonix  Again she will need PT and OT- also will update a CBC and BMP on Monday, October 14 for updated values- hemoglobin was 11.3 in the hospital appear to be relatively stable white count was not elevated renal function appeared to be stable   [id number]-of note greater than 35 minutes spent on this discharge summary-greater than 50% of time spent coordinating a plan of care for numerous diagnoses

## 2018-09-09 ENCOUNTER — Encounter: Payer: Self-pay | Admitting: Internal Medicine

## 2018-09-09 LAB — CULTURE, BLOOD (ROUTINE X 2)
CULTURE: NO GROWTH
Culture: NO GROWTH
Special Requests: ADEQUATE
Special Requests: ADEQUATE

## 2018-09-10 ENCOUNTER — Non-Acute Institutional Stay (SKILLED_NURSING_FACILITY): Payer: Medicare Other | Admitting: Internal Medicine

## 2018-09-10 ENCOUNTER — Encounter: Payer: Self-pay | Admitting: Internal Medicine

## 2018-09-10 ENCOUNTER — Encounter (HOSPITAL_COMMUNITY)
Admission: RE | Admit: 2018-09-10 | Discharge: 2018-09-10 | Disposition: A | Discharge status: Home / Self Care | Payer: Medicare Other | Source: Skilled Nursing Facility | Attending: Internal Medicine | Admitting: Internal Medicine

## 2018-09-10 DIAGNOSIS — E785 Hyperlipidemia, unspecified: Secondary | ICD-10-CM

## 2018-09-10 DIAGNOSIS — I480 Paroxysmal atrial fibrillation: Secondary | ICD-10-CM | POA: Diagnosis not present

## 2018-09-10 DIAGNOSIS — I10 Essential (primary) hypertension: Secondary | ICD-10-CM | POA: Diagnosis not present

## 2018-09-10 DIAGNOSIS — E119 Type 2 diabetes mellitus without complications: Secondary | ICD-10-CM

## 2018-09-10 DIAGNOSIS — L97519 Non-pressure chronic ulcer of other part of right foot with unspecified severity: Secondary | ICD-10-CM

## 2018-09-10 DIAGNOSIS — E11621 Type 2 diabetes mellitus with foot ulcer: Secondary | ICD-10-CM | POA: Diagnosis not present

## 2018-09-10 DIAGNOSIS — E1159 Type 2 diabetes mellitus with other circulatory complications: Secondary | ICD-10-CM | POA: Insufficient documentation

## 2018-09-10 LAB — CBC WITH DIFFERENTIAL/PLATELET
Abs Immature Granulocytes: 0.01 10*3/uL (ref 0.00–0.07)
Basophils Absolute: 0 10*3/uL (ref 0.0–0.1)
Basophils Relative: 1 %
Eosinophils Absolute: 0.1 10*3/uL (ref 0.0–0.5)
Eosinophils Relative: 1 %
HEMATOCRIT: 32.6 % — AB (ref 36.0–46.0)
Hemoglobin: 10.7 g/dL — ABNORMAL LOW (ref 12.0–15.0)
IMMATURE GRANULOCYTES: 0 %
LYMPHS ABS: 2 10*3/uL (ref 0.7–4.0)
Lymphocytes Relative: 41 %
MCH: 30.1 pg (ref 26.0–34.0)
MCHC: 32.8 g/dL (ref 30.0–36.0)
MCV: 91.6 fL (ref 80.0–100.0)
Monocytes Absolute: 0.4 10*3/uL (ref 0.1–1.0)
Monocytes Relative: 8 %
NEUTROS ABS: 2.4 10*3/uL (ref 1.7–7.7)
NEUTROS PCT: 49 %
Platelets: 220 10*3/uL (ref 150–400)
RBC: 3.56 MIL/uL — ABNORMAL LOW (ref 3.87–5.11)
RDW: 13.4 % (ref 11.5–15.5)
WBC: 4.9 10*3/uL (ref 4.0–10.5)
nRBC: 0 % (ref 0.0–0.2)

## 2018-09-10 LAB — BASIC METABOLIC PANEL
ANION GAP: 8 (ref 5–15)
BUN: 18 mg/dL (ref 8–23)
CALCIUM: 8.6 mg/dL — AB (ref 8.9–10.3)
CO2: 27 mmol/L (ref 22–32)
Chloride: 101 mmol/L (ref 98–111)
Creatinine, Ser: 0.94 mg/dL (ref 0.44–1.00)
GFR calc Af Amer: >60 mL/min (ref >60)
GFR calc non Af Amer: 57 mL/min — ABNORMAL LOW (ref >60)
GLUCOSE: 126 mg/dL — AB (ref 70–99)
Potassium: 3 mmol/L — ABNORMAL LOW (ref 3.5–5.1)
Sodium: 136 mmol/L (ref 135–145)

## 2018-09-10 NOTE — Progress Notes (Signed)
Provider:  Einar Crow MD Location:    [facility] Room Number: 132/P Place of Service:  SNF (31)  PCP: Gareth Morgan, MD Patient Care Team: Gareth Morgan, MD as PCP - General [facility] Medicine)  Extended Emergency Contact Information Primary Emergency Contact: Galer,Bobby Address: [address]          [place], [place] [address] [place] of [place] Home Phone: [phone] Mobile Phone: [phone] Relation: Spouse  Code Status: Full Code Goals of Care: Advanced Directive information Advanced Directives 09/10/2018  Does Patient Have a Medical Advance Directive? Yes  Type of Advance Directive (No Data)  Does patient want to make changes to medical advance directive? No - Patient declined  Would patient like information on creating a medical advance directive? No - Patient declined  Pre-existing out of facility DNR order (yellow form or pink MOST form) -      Chief Complaint  Patient presents with  . New Admit To SNF    New Admission Visit    HPI: Patient is a 77 y.o. female seen today for admission to SNF for  Wound Care. She stayed in Hospital from 10/07-10/11 for Diabetic Ulcer She has h/o PAF on Eliquis, GERD, Hypertension, Diastolic Dysfunction, Hyperlipidemia, Diabetes Type 2, H/O CVA with Aphasia  Patient has noticed Blisters in her Right Foot for few weeks .  She failed oral antibiotics and was admitted.  She was treated with IV Rocephin and vancomycin.  Her MRI was negative for any osteomyelitis.  Arterial Doppler was showed good circulation.  She was transitioned to p.o. antibiotics and was sent to SNF for wound care and therapy. Patient was unable to give me any detailed history.  She said that she does have pain in that leg.  Denies any fever or chills Did c/o Nausea and decrased appetite She lives with her husband and was driving before this.Independent in ADL   Past Medical History:  Diagnosis Date  . Diabetes (HCC)    Type 2 x  10 yrs  . Dysrhythmia    AFib  . GERD (gastroesophageal reflux disease)   . Grade I diastolic dysfunction 09/03/2018  . High cholesterol   . Hypertension    x 10 yrs  . Paroxysmal a-fib: Remote hx of afib 06/15/2015  . Stroke [facility])    no deficits   Past Surgical History:  Procedure Laterality Date  . CATARACT EXTRACTION W/PHACO Left 06/06/2016   Procedure: CATARACT EXTRACTION PHACO AND INTRAOCULAR LENS PLACEMENT (IOC);  Surgeon: Gemma Payor, MD;  Location: [facility] ORS;  Service: Ophthalmology;  Laterality: Left;  CDE: 8.32  . COLONOSCOPY    . COLONOSCOPY N/A 05/08/2013   Procedure: COLONOSCOPY;  Surgeon: Malissa Hippo, MD;  Location: [facility] ENDO SUITE;  Service: Endoscopy;  Laterality: N/A;  1015  . NECK SURGERY     x 2 for spur and disc problems    reports that she has never smoked. She has never used smokeless tobacco. She reports that she does not drink alcohol or use drugs. Social History   Socioeconomic History  . Marital status: Married    Spouse name: Not on file  . Number of children: Not on file  . Years of education: Not on file  . Highest education level: Not on file  Occupational History  . Not on file  Social Needs  . Financial resource strain: Not on file  . Food insecurity:    Worry: Not on file    Inability: Not on file  .  Transportation needs:    Medical: Not on file    Non-medical: Not on file  Tobacco Use  . Smoking status: Never Smoker  . Smokeless tobacco: Never Used  Substance and Sexual Activity  . Alcohol use: No    Alcohol/week: 0.0 standard drinks  . Drug use: No  . Sexual activity: Not Currently    Birth control/protection: None  Lifestyle  . Physical activity:    Days per week: Not on file    Minutes per session: Not on file  . Stress: Not on file  Relationships  . Social connections:    Talks on phone: Not on file    Gets together: Not on file    Attends religious service: Not on file    Active member of club or organization: Not on file     Attends meetings of clubs or organizations: Not on file    Relationship status: Not on file  . Intimate partner violence:    Fear of current or ex partner: Not on file    Emotionally abused: Not on file    Physically abused: Not on file    Forced sexual activity: Not on file  Other Topics Concern  . Not on file  Social History Narrative  . Not on file    Functional Status Survey:    Family History  Problem Relation Age of Onset  . Diabetes Father     Health Maintenance  Topic Date Due  . FOOT EXAM  10/08/2018 (Originally 12/20/1950)  . OPHTHALMOLOGY EXAM  10/08/2018 (Originally 12/20/1950)  . PNA vac Low Risk Adult (1 of 2 - PCV13) 10/08/2018 (Originally 12/19/2006)  . HEMOGLOBIN A1C  03/05/2019  . TETANUS/TDAP  09/03/2028  . INFLUENZA VACCINE  Completed  . DEXA SCAN  Completed    No Known Allergies  Outpatient Encounter Medications as of 09/10/2018  Medication Sig  . acetaminophen (TYLENOL) 325 MG tablet Take 650 mg by mouth every 6 (six) hours as needed.  Marland Kitchen apixaban (ELIQUIS) 5 MG TABS tablet Take 1 tablet (5 mg total) by mouth 2 (two) times daily.  Marland Kitchen atorvastatin (LIPITOR) 20 MG tablet Take 1 tablet (20 mg total) by mouth at bedtime.  Marland Kitchen diltiazem (CARDIZEM CD) 120 MG 24 hr capsule Take 1 capsule (120 mg total) by mouth daily.  Marland Kitchen doxycycline (VIBRA-TABS) 100 MG tablet Take 1 tablet (100 mg total) by mouth 2 (two) times daily. For 5 more days  . lisinopril-hydrochlorothiazide (PRINZIDE,ZESTORETIC) 20-12.5 MG tablet Take 1 tablet by mouth daily.  . metFORMIN (GLUCOPHAGE) 1000 MG tablet Take 500 mg by mouth 2 (two) times daily with a meal.  . metoprolol tartrate (LOPRESSOR) 25 MG tablet Take 12.5 mg by mouth 2 (two) times daily.  . ondansetron (ZOFRAN) 4 MG tablet Take 4 mg by mouth every 6 (six) hours as needed for nausea or vomiting.  . pantoprazole (PROTONIX) 40 MG tablet Take 40 mg by mouth daily.   No facility-administered encounter medications on file as of  09/10/2018.      Review of Systems  Review of Systems  Constitutional: Negative for activity change, appetite change, chills, diaphoresis, fatigue and fever.  HENT: Negative for mouth sores, postnasal drip, rhinorrhea, sinus pain and sore throat.   Respiratory: Negative for apnea, cough, chest tightness, shortness of breath and wheezing.   Cardiovascular: Negative for chest pain, palpitations  Gastrointestinal: Negative for abdominal distention, abdominal pain, constipation, diarrhea,   Genitourinary: Negative for dysuria and frequency.  Musculoskeletal: Negative for arthralgias, joint swelling  and myalgias.  Skin: Negative for rash.  Neurological: Negative for dizziness, syncope, weakness, light-headedness and numbness.  Psychiatric/Behavioral: Negative for behavioral problems, confusion and sleep disturbance.     Vitals:   09/10/18 1524  BP: (!) 98/58  Pulse: 85  Resp: 18  Temp: 99.6 F (37.6 C)  TempSrc: Oral   There is no height or weight on file to calculate BMI. Physical Exam  Constitutional: She appears well-developed and well-nourished.  HENT:  Head: Normocephalic.  Mouth/Throat: Oropharynx is clear and moist.  Eyes: Pupils are equal, round, and reactive to light.  Neck: Neck supple.  Cardiovascular: Normal rate and regular rhythm.  No murmur heard. Pulmonary/Chest: Effort normal and breath sounds normal. No stridor. No respiratory distress. She has no wheezes.  Abdominal: Soft. Bowel sounds are normal. She exhibits no distension. There is no tenderness. There is no guarding.  Musculoskeletal:  Mild edema Bilateral. Has 2 almost healed areas. Continue to have blisters with Clear Fluid on Great Toe.  Neurological: She is alert.  Was not oriented to Date. Did not know her age or Dates Did not have any focal deficits  Skin: Skin is warm and dry.  Psychiatric: She has a normal mood and affect. Her behavior is normal. Thought content normal.      Labs  reviewed: Basic Metabolic Panel: Recent Labs    09/03/18 2143  09/06/18 0539 09/07/18 0528 09/10/18 0230  NA  --    < > 138 139 136  K  --    < > 3.8 3.7 3.0*  CL  --    < > 104 105 101  CO2  --    < > 26 26 27   GLUCOSE  --    < > 110* 108* 126*  BUN  --    < > 22 20 18   CREATININE  --    < > 0.86 0.85 0.94  CALCIUM  --    < > 8.9 9.1 8.6*  MG 1.3*  --   --   --   --   PHOS 3.2  --   --   --   --    < > = values in this interval not displayed.   Liver Function Tests: Recent Labs    01/21/18 1217 04/20/18 1219 09/03/18 1937  AST 20 18 15   ALT 10* 10* 9  ALKPHOS 69 63 59  BILITOT 0.8 1.3* 1.0  PROT 7.2 7.3 7.8  ALBUMIN 3.8 3.8 4.0   No results for input(s): LIPASE, AMYLASE in the last 8760 hours. No results for input(s): AMMONIA in the last 8760 hours. CBC: Recent Labs    09/06/18 0539 09/07/18 0528 09/10/18 0230  WBC 4.9 4.4 4.9  NEUTROABS 2.9 2.5 2.4  HGB 10.7* 11.3* 10.7*  HCT 33.3* 35.7* 32.6*  MCV 92.8 93.0 91.6  PLT 183 206 220   Cardiac Enzymes: Recent Labs    01/21/18 1217  TROPONINI <0.03   BNP: Invalid input(s): POCBNP Lab Results  Component Value Date   HGBA1C 8.0 (H) 09/03/2018    No results found for: VITAMINB12 No results found for: FOLATE No results found for: IRON, TIBC, FERRITIN  Imaging and Procedures obtained prior to SNF admission: No results found.  Assessment/Plan Essential hypertension Patient BP has been running low in the facility Will decrase her Lisinopril and HCTZ for 10/12.5 Continue on Metoprolol and Cardiazem  Diabetic ulcer of right foot  Looks almost Healed at 2 places One blister on Great toe On Antibiotics  Doxycycline for 5 more days Continue Dry Dressing  Type 2 diabetes mellitus   Will start Accu checks On Metformin Follows with PCP  Paroxysmal atrial fibrillation  On Eliquis and Diltiazem Anemia Most likely Chronic Disease Will repeat CBC  Hyperlipidemia On atrovastatin Cognitive  Impairment ? With her h/o previous stroke Will have cognition Eval Hypokalemia Supplement Family/ staff Communication:   Labs/tests ordered:  Total time spent in this patient care encounter was 45_ minutes; greater than 50% of the visit spent counseling patient, reviewing records , Labs and coordinating care for problems addressed at this encounter.

## 2018-09-17 ENCOUNTER — Encounter (HOSPITAL_COMMUNITY)
Admission: RE | Admit: 2018-09-17 | Discharge: 2018-09-17 | Disposition: A | Discharge status: Home / Self Care | Payer: Medicare Other | Source: Skilled Nursing Facility | Attending: Internal Medicine | Admitting: Internal Medicine

## 2018-09-17 DIAGNOSIS — L089 Local infection of the skin and subcutaneous tissue, unspecified: Secondary | ICD-10-CM | POA: Insufficient documentation

## 2018-09-17 DIAGNOSIS — S90822D Blister (nonthermal), left foot, subsequent encounter: Secondary | ICD-10-CM | POA: Insufficient documentation

## 2018-09-17 DIAGNOSIS — E1159 Type 2 diabetes mellitus with other circulatory complications: Secondary | ICD-10-CM | POA: Insufficient documentation

## 2018-09-17 LAB — CBC
HEMATOCRIT: 41 % (ref 36.0–46.0)
HEMOGLOBIN: 12.9 g/dL (ref 12.0–15.0)
MCH: 28.7 pg (ref 26.0–34.0)
MCHC: 31.5 g/dL (ref 30.0–36.0)
MCV: 91.1 fL (ref 80.0–100.0)
NRBC: 0 % (ref 0.0–0.2)
Platelets: 240 10*3/uL (ref 150–400)
RBC: 4.5 MIL/uL (ref 3.87–5.11)
RDW: 13.4 % (ref 11.5–15.5)
WBC: 6.9 10*3/uL (ref 4.0–10.5)

## 2018-09-17 LAB — BASIC METABOLIC PANEL
ANION GAP: 10 (ref 5–15)
BUN: 26 mg/dL — ABNORMAL HIGH (ref 8–23)
CALCIUM: 9.8 mg/dL (ref 8.9–10.3)
CO2: 26 mmol/L (ref 22–32)
Chloride: 101 mmol/L (ref 98–111)
Creatinine, Ser: 1 mg/dL (ref 0.44–1.00)
GFR, EST NON AFRICAN AMERICAN: 53 mL/min — AB (ref >60)
GLUCOSE: 146 mg/dL — AB (ref 70–99)
POTASSIUM: 4.8 mmol/L (ref 3.5–5.1)
Sodium: 137 mmol/L (ref 135–145)

## 2018-09-24 ENCOUNTER — Other Ambulatory Visit (HOSPITAL_COMMUNITY)
Admission: RE | Admit: 2018-09-24 | Discharge: 2018-09-24 | Disposition: A | Discharge status: Home / Self Care | Payer: Medicare Other | Source: Skilled Nursing Facility | Attending: Internal Medicine | Admitting: Internal Medicine

## 2018-09-24 LAB — BASIC METABOLIC PANEL
ANION GAP: 12 (ref 5–15)
BUN: 34 mg/dL — ABNORMAL HIGH (ref 8–23)
CALCIUM: 9.5 mg/dL (ref 8.9–10.3)
CO2: 27 mmol/L (ref 22–32)
CREATININE: 1.03 mg/dL — AB (ref 0.44–1.00)
Chloride: 96 mmol/L — ABNORMAL LOW (ref 98–111)
GFR, EST AFRICAN AMERICAN: 60 mL/min — AB (ref >60)
GFR, EST NON AFRICAN AMERICAN: 51 mL/min — AB (ref >60)
GLUCOSE: 129 mg/dL — AB (ref 70–99)
Potassium: 3.5 mmol/L (ref 3.5–5.1)
Sodium: 135 mmol/L (ref 135–145)

## 2018-09-25 ENCOUNTER — Encounter: Payer: Self-pay | Admitting: Internal Medicine

## 2018-09-25 ENCOUNTER — Non-Acute Institutional Stay (SKILLED_NURSING_FACILITY): Payer: Medicare Other | Admitting: Internal Medicine

## 2018-09-25 DIAGNOSIS — E11621 Type 2 diabetes mellitus with foot ulcer: Secondary | ICD-10-CM | POA: Diagnosis not present

## 2018-09-25 DIAGNOSIS — I480 Paroxysmal atrial fibrillation: Secondary | ICD-10-CM | POA: Diagnosis not present

## 2018-09-25 DIAGNOSIS — I10 Essential (primary) hypertension: Secondary | ICD-10-CM

## 2018-09-25 DIAGNOSIS — E1159 Type 2 diabetes mellitus with other circulatory complications: Secondary | ICD-10-CM

## 2018-09-25 DIAGNOSIS — L97519 Non-pressure chronic ulcer of other part of right foot with unspecified severity: Secondary | ICD-10-CM

## 2018-09-25 NOTE — Progress Notes (Signed)
Location:    [facility] Room Number: 132/P Place of Service:  SNF (31)  Provider:Amal Renbarger Chales Abrahams MD  PCP: Gareth Morgan, MD Patient Care Team: Gareth Morgan, MD as PCP - General [facility] Medicine)  Extended Emergency Contact Information Primary Emergency Contact: Godbolt,Bobby Address: [address]          [place], [place] [address] [place] of [place] Home Phone: [phone] Mobile Phone: [phone] Relation: Spouse  Code Status: Full Code Goals of care:  Advanced Directive information Advanced Directives 09/25/2018  Does Patient Have a Medical Advance Directive? Yes  Type of Advance Directive (No Data)  Does patient want to make changes to medical advance directive? No - Patient declined  Would patient like information on creating a medical advance directive? No - Patient declined  Pre-existing out of facility DNR order (yellow form or pink MOST form) -     No Known Allergies  Chief Complaint  Patient presents with  . Discharge Note    Discharge Visit    HPI:  77 y.o. female  Seen today for Discharge Patient was admitted to SNF for therapy and wound Care.  She stayed in Hospital from 10/07-10/11 for Diabetic Ulcer She has h/o PAF on Eliquis, GERD, Hypertension, Diastolic Dysfunction, Hyperlipidemia, Diabetes Type 2, H/O CVA with Aphasia.  Patient has noticed Blisters in her Right Foot for few weeks .  She failed oral antibiotics and was admitted.  She was treated with IV Rocephin and vancomycin.  Her MRI was negative for any osteomyelitis.  Arterial Doppler was showed good circulation.  She was transitioned to p.o. antibiotics and was sent to SNF for wound care and therapy. Patient did well with therapy and is walking with the walker.  Her wounds have healed except blister on her Toe which is also healing well. She will go home with her husband. Her Only complaint today was Constipation.  Past Medical History:  Diagnosis Date  .  Diabetes (HCC)    Type 2 x 10 yrs  . Dysrhythmia    AFib  . GERD (gastroesophageal reflux disease)   . Grade I diastolic dysfunction 09/03/2018  . High cholesterol   . Hypertension    x 10 yrs  . Paroxysmal a-fib: Remote hx of afib 06/15/2015  . Stroke [facility])    no deficits    Past Surgical History:  Procedure Laterality Date  . CATARACT EXTRACTION W/PHACO Left 06/06/2016   Procedure: CATARACT EXTRACTION PHACO AND INTRAOCULAR LENS PLACEMENT (IOC);  Surgeon: Gemma Payor, MD;  Location: [facility] ORS;  Service: Ophthalmology;  Laterality: Left;  CDE: 8.32  . COLONOSCOPY    . COLONOSCOPY N/A 05/08/2013   Procedure: COLONOSCOPY;  Surgeon: Malissa Hippo, MD;  Location: [facility] ENDO SUITE;  Service: Endoscopy;  Laterality: N/A;  1015  . NECK SURGERY     x 2 for spur and disc problems      reports that she has never smoked. She has never used smokeless tobacco. She reports that she does not drink alcohol or use drugs. Social History   Socioeconomic History  . Marital status: Married    Spouse name: Not on file  . Number of children: Not on file  . Years of education: Not on file  . Highest education level: Not on file  Occupational History  . Not on file  Social Needs  . Financial resource strain: Not on file  . Food insecurity:    Worry: Not on file    Inability: Not on  file  . Transportation needs:    Medical: Not on file    Non-medical: Not on file  Tobacco Use  . Smoking status: Never Smoker  . Smokeless tobacco: Never Used  Substance and Sexual Activity  . Alcohol use: No    Alcohol/week: 0.0 standard drinks  . Drug use: No  . Sexual activity: Not Currently    Birth control/protection: None  Lifestyle  . Physical activity:    Days per week: Not on file    Minutes per session: Not on file  . Stress: Not on file  Relationships  . Social connections:    Talks on phone: Not on file    Gets together: Not on file    Attends religious service: Not on file    Active member of  club or organization: Not on file    Attends meetings of clubs or organizations: Not on file    Relationship status: Not on file  . Intimate partner violence:    Fear of current or ex partner: Not on file    Emotionally abused: Not on file    Physically abused: Not on file    Forced sexual activity: Not on file  Other Topics Concern  . Not on file  Social History Narrative  . Not on file   Functional Status Survey:    No Known Allergies  Pertinent  Health Maintenance Due  Topic Date Due  . FOOT EXAM  10/08/2018 (Originally 12/20/1950)  . OPHTHALMOLOGY EXAM  10/08/2018 (Originally 12/20/1950)  . PNA vac Low Risk Adult (1 of 2 - PCV13) 10/08/2018 (Originally 12/19/2006)  . URINE MICROALBUMIN  10/26/2018 (Originally 12/20/1950)  . HEMOGLOBIN A1C  03/05/2019  . INFLUENZA VACCINE  Completed  . DEXA SCAN  Completed    Medications: Outpatient Encounter Medications as of 09/25/2018  Medication Sig  . acetaminophen (TYLENOL) 325 MG tablet Take 650 mg by mouth every 6 (six) hours as needed.  . Amino Acids-Protein Hydrolys (FEEDING SUPPLEMENT, PRO-STAT SUGAR FREE 64,) LIQD Take 30 mLs by mouth 2 (two) times daily between meals.  Marland Kitchen apixaban (ELIQUIS) 5 MG TABS tablet Take 1 tablet (5 mg total) by mouth 2 (two) times daily.  Marland Kitchen atorvastatin (LIPITOR) 20 MG tablet Take 1 tablet (20 mg total) by mouth at bedtime.  Marland Kitchen diltiazem (CARDIZEM CD) 120 MG 24 hr capsule Take 1 capsule (120 mg total) by mouth daily.  Marland Kitchen lisinopril-hydrochlorothiazide (PRINZIDE,ZESTORETIC) 20-12.5 MG tablet Take 1 tablet by mouth daily.  . metFORMIN (GLUCOPHAGE) 1000 MG tablet Take 500 mg by mouth 2 (two) times daily with a meal.  . metoprolol tartrate (LOPRESSOR) 25 MG tablet Take 12.5 mg by mouth 2 (two) times daily.  . ondansetron (ZOFRAN) 4 MG tablet Take 4 mg by mouth every 6 (six) hours as needed for nausea or vomiting.  . pantoprazole (PROTONIX) 40 MG tablet Take 40 mg by mouth daily.  . [DISCONTINUED] doxycycline  (VIBRA-TABS) 100 MG tablet Take 1 tablet (100 mg total) by mouth 2 (two) times daily. For 5 more days   No facility-administered encounter medications on file as of 09/25/2018.      Review of Systems  Vitals:   09/25/18 1339  BP: 111/67  Pulse: 83  Resp: 17  Temp: (!) 96.7 F (35.9 C)  TempSrc: Oral  SpO2: 90%   There is no height or weight on file to calculate BMI. Physical Exam  Constitutional: She appears well-developed and well-nourished.  HENT:  Head: Normocephalic.  Mouth/Throat: Oropharynx is clear and  moist.  Eyes: Pupils are equal, round, and reactive to light.  Neck: Neck supple.  Cardiovascular: Normal rate and regular rhythm.  No murmur heard. Pulmonary/Chest: Effort normal and breath sounds normal. No stridor. No respiratory distress. She has no wheezes.  Abdominal: Soft. Bowel sounds are normal. She exhibits no distension. There is no tenderness. There is no guarding.  Musculoskeletal:  Mild edema Bilateral. Has 2 healed areas and 1 healing on the Big Toe. No swelling or redness  Neurological: She is alert.  Was not oriented to Date. Did not know her age or Dates Did not have any focal deficits  Skin: Skin is warm and dry.  Psychiatric: She has a normal mood and affect. Her behavior is normal. Thought content normal.    Labs reviewed: Basic Metabolic Panel: Recent Labs    09/03/18 2143  09/10/18 0230 09/17/18 0800 09/24/18 0331  NA  --    < > 136 137 135  K  --    < > 3.0* 4.8 3.5  CL  --    < > 101 101 96*  CO2  --    < > 27 26 27   GLUCOSE  --    < > 126* 146* 129*  BUN  --    < > 18 26* 34*  CREATININE  --    < > 0.94 1.00 1.03*  CALCIUM  --    < > 8.6* 9.8 9.5  MG 1.3*  --   --   --   --   PHOS 3.2  --   --   --   --    < > = values in this interval not displayed.   Liver Function Tests: Recent Labs    01/21/18 1217 04/20/18 1219 09/03/18 1937  AST 20 18 15   ALT 10* 10* 9  ALKPHOS 69 63 59  BILITOT 0.8 1.3* 1.0  PROT 7.2 7.3 7.8    ALBUMIN 3.8 3.8 4.0   No results for input(s): LIPASE, AMYLASE in the last 8760 hours. No results for input(s): AMMONIA in the last 8760 hours. CBC: Recent Labs    09/06/18 0539 09/07/18 0528 09/10/18 0230 09/17/18 0800  WBC 4.9 4.4 4.9 6.9  NEUTROABS 2.9 2.5 2.4  --   HGB 10.7* 11.3* 10.7* 12.9  HCT 33.3* 35.7* 32.6* 41.0  MCV 92.8 93.0 91.6 91.1  PLT 183 206 220 240   Cardiac Enzymes: Recent Labs    01/21/18 1217  TROPONINI <0.03   BNP: Invalid input(s): POCBNP CBG: Recent Labs    09/06/18 2117 09/07/18 0742 09/07/18 1136  GLUCAP 121* 100* 153*    Procedures and Imaging Studies During Stay: Dg Chest 2 View  Result Date: 09/03/2018 CLINICAL DATA:  Foot wound for several weeks with drainage, initial encounter EXAM: CHEST - 2 VIEW COMPARISON:  01/21/2018 FINDINGS: Cardiac shadows within normal limits. The lungs are well aerated bilaterally. No focal infiltrate or sizable effusion is seen. No acute bony abnormality is noted. Degenerative changes of the thoracic spine are noted. IMPRESSION: No active cardiopulmonary disease. Electronically Signed   By: Alcide Clever M.D.   On: 09/03/2018 18:45   Dg Ankle Complete Right  Result Date: 09/03/2018 CLINICAL DATA:  Foot and ankle pain and redness.  Blisters. EXAM: RIGHT ANKLE - COMPLETE 3+ VIEW COMPARISON:  None. FINDINGS: No fracture or bone lesion. No bone resorption to suggest osteomyelitis. Ankle joint normally spaced and aligned.  No arthropathic changes. Moderate plantar calcaneal spur. Mild diffuse soft tissue swelling.  IMPRESSION: No fracture, ankle joint abnormality or evidence of osteomyelitis. Electronically Signed   By: Amie Portland M.D.   On: 09/03/2018 20:56   Mr Foot Right W Wo Contrast  Result Date: 09/04/2018 CLINICAL DATA:  Foot swelling, wound on the foot for several weeks EXAM: MRI OF THE RIGHT FOREFOOT WITHOUT AND WITH CONTRAST TECHNIQUE: Multiplanar, multisequence MR imaging of the right forefoot was  performed before and after the administration of intravenous contrast. CONTRAST:  5 mL Gadavist COMPARISON:  None. FINDINGS: Bones/Joint/Cartilage No marrow signal abnormality. No fracture or dislocation. Normal alignment. No joint effusion. No periosteal reaction or bone destruction. No aggressive osseous lesion. Mild osteoarthritis of the first MTP joint. Ligaments Collateral ligaments are intact.  Lisfranc ligament is intact. Muscles and Tendons Flexor, peroneal and extensor compartment tendons are intact. Achilles tendon is intact. Plantar fascia is intact. Muscles are normal. Soft tissue Fluid-filled skin blister along the dorsal aspect of the great toe measuring 1 x 2.7 x 2.4 cm. No peripheral enhancement. Fluid-filled skin blister along the posterolateral aspect of the hindfoot measuring 1 x 2.9 x 3.1 cm without enhancement. No soft tissue mass. IMPRESSION: 1. No osteomyelitis of the right foot. 2. Fluid-filled skin blisters along the dorsal aspect of the great toe and posterolateral hindfoot Electronically Signed   By: Elige Ko   On: 09/04/2018 11:44   US Arterial Abi (screening Lower Extremity)  Result Date: 09/04/2018 CLINICAL DATA:  Multiple large blisters/ulcerations on the right foot. EXAM: NONINVASIVE PHYSIOLOGIC VASCULAR STUDY OF BILATERAL LOWER EXTREMITIES TECHNIQUE: Evaluation of both lower extremities were performed at rest, including calculation of ankle-brachial indices with single level Doppler, pressure and pulse volume recording. COMPARISON:  None. FINDINGS: Right ABI:  1.08 Left ABI:  1.06 Right Lower Extremity: Irregular waveforms are likely associated with a dysrhythmia. Left Lower Extremity: Irregular waveforms are likely associated with a dysrhythmia. IMPRESSION: Normal ankle-brachial indices. Irregular waveforms are likely associated with a dysrhythmia. Electronically Signed   By: Richarda Overlie M.D.   On: 09/04/2018 10:24   Dg Foot Complete Right  Result Date:  09/03/2018 CLINICAL DATA:  Blisters at Right big toe, right heel, across top of right foot, some blisters have burst, wounds have been on right foot for weeks, pain EXAM: RIGHT FOOT COMPLETE - 3+ VIEW COMPARISON:  None. FINDINGS: No fracture. No bone lesion. There are no areas of bone resorption to suggest osteomyelitis. Mild first metatarsophalangeal joint osteoarthritis. Remaining joints normally spaced and aligned. Moderate plantar calcaneal spur. Mild dorsal soft tissue swelling. IMPRESSION: No fracture or dislocation.  No evidence of osteomyelitis. Electronically Signed   By: Amie Portland M.D.   On: 09/03/2018 20:55    Assessment/Plan:    Essential hypertension Patient BP was running low in facility So  her Lisinopril and HCTZ was decreased to 10/12.5 Continue on Metoprolol and Cardizem Follow up with PCP  Diabetic ulcer of right foot  Healed Blisters D/W the patient and husband to be careful with the Shoes Type 2 diabetes mellitus   Accu checks in facility showed BS less then 150 mostly Continue on Metformin Follow up with PCP Paroxysmal atrial fibrillation  On Eliquis and Diltiazem  Hyperlipidemia On atrovastatin Cognitive Impairment ? With her h/o previous stroke Will need outpatient Cognitive follow up Hypokalemia Supplemented Future labs/tests needed:  Follow up BMP to follow Potassium and BUN  Patient will be followed by Home health, Therapy She has Dan Humphreys  She will Follow with her PCP in 1 week Discharge time more then  30 min

## 2019-10-02 ENCOUNTER — Other Ambulatory Visit: Payer: Self-pay

## 2019-10-02 ENCOUNTER — Encounter (HOSPITAL_COMMUNITY): Payer: Self-pay | Admitting: *Deleted

## 2019-10-02 ENCOUNTER — Emergency Department (HOSPITAL_COMMUNITY): Payer: Medicare Other

## 2019-10-02 ENCOUNTER — Emergency Department (HOSPITAL_COMMUNITY)
Admission: EM | Admit: 2019-10-02 | Discharge: 2019-10-02 | Disposition: A | Discharge status: Home / Self Care | Payer: Medicare Other | Attending: Emergency Medicine | Admitting: Emergency Medicine

## 2019-10-02 DIAGNOSIS — Z79899 Other long term (current) drug therapy: Secondary | ICD-10-CM | POA: Insufficient documentation

## 2019-10-02 DIAGNOSIS — R238 Other skin changes: Secondary | ICD-10-CM | POA: Diagnosis not present

## 2019-10-02 DIAGNOSIS — I10 Essential (primary) hypertension: Secondary | ICD-10-CM | POA: Diagnosis not present

## 2019-10-02 DIAGNOSIS — E119 Type 2 diabetes mellitus without complications: Secondary | ICD-10-CM | POA: Insufficient documentation

## 2019-10-02 DIAGNOSIS — Z8673 Personal history of transient ischemic attack (TIA), and cerebral infarction without residual deficits: Secondary | ICD-10-CM | POA: Insufficient documentation

## 2019-10-02 DIAGNOSIS — Z7901 Long term (current) use of anticoagulants: Secondary | ICD-10-CM | POA: Diagnosis not present

## 2019-10-02 DIAGNOSIS — Z7984 Long term (current) use of oral hypoglycemic drugs: Secondary | ICD-10-CM | POA: Diagnosis not present

## 2019-10-02 LAB — CBC WITH DIFFERENTIAL/PLATELET
Abs Immature Granulocytes: 0.01 10*3/uL (ref 0.00–0.07)
Basophils Absolute: 0 10*3/uL (ref 0.0–0.1)
Basophils Relative: 1 %
Eosinophils Absolute: 0 10*3/uL (ref 0.0–0.5)
Eosinophils Relative: 1 %
HCT: 37.4 % (ref 36.0–46.0)
Hemoglobin: 12 g/dL (ref 12.0–15.0)
Immature Granulocytes: 0 %
Lymphocytes Relative: 24 %
Lymphs Abs: 1.5 10*3/uL (ref 0.7–4.0)
MCH: 29.6 pg (ref 26.0–34.0)
MCHC: 32.1 g/dL (ref 30.0–36.0)
MCV: 92.1 fL (ref 80.0–100.0)
Monocytes Absolute: 0.3 10*3/uL (ref 0.1–1.0)
Monocytes Relative: 5 %
Neutro Abs: 4.2 10*3/uL (ref 1.7–7.7)
Neutrophils Relative %: 69 %
Platelets: 159 10*3/uL (ref 150–400)
RBC: 4.06 MIL/uL (ref 3.87–5.11)
RDW: 13.2 % (ref 11.5–15.5)
WBC: 6 10*3/uL (ref 4.0–10.5)
nRBC: 0 % (ref 0.0–0.2)

## 2019-10-02 LAB — BASIC METABOLIC PANEL
Anion gap: 10 (ref 5–15)
BUN: 24 mg/dL — ABNORMAL HIGH (ref 8–23)
CO2: 25 mmol/L (ref 22–32)
Calcium: 9.4 mg/dL (ref 8.9–10.3)
Chloride: 105 mmol/L (ref 98–111)
Creatinine, Ser: 0.97 mg/dL (ref 0.44–1.00)
GFR calc Af Amer: >60 mL/min (ref >60)
GFR calc non Af Amer: 56 mL/min — ABNORMAL LOW (ref >60)
Glucose, Bld: 139 mg/dL — ABNORMAL HIGH (ref 70–99)
Potassium: 3.8 mmol/L (ref 3.5–5.1)
Sodium: 140 mmol/L (ref 135–145)

## 2019-10-02 LAB — CBG MONITORING, ED: Glucose-Capillary: 136 mg/dL — ABNORMAL HIGH (ref 70–99)

## 2019-10-02 MED ORDER — DILTIAZEM HCL 30 MG PO TABS
120.0000 mg | ORAL_TABLET | Freq: Once | ORAL | Status: AC
  Administered 2019-10-02: 120 mg via ORAL
  Filled 2019-10-02: qty 4

## 2019-10-02 MED ORDER — METOPROLOL TARTRATE 25 MG PO TABS
25.0000 mg | ORAL_TABLET | Freq: Once | ORAL | Status: AC
  Administered 2019-10-02: 25 mg via ORAL
  Filled 2019-10-02: qty 1

## 2019-10-02 NOTE — ED Triage Notes (Signed)
Pt in c/o R inner ankle blister onset x2 days, pt has 6cm x 3cm fluid filled blister present with periarea redness, pt denies injury, A&O x4

## 2019-10-02 NOTE — ED Provider Notes (Signed)
[facility] EMERGENCY DEPARTMENT Provider Note   CSN: [id number] Arrival date & time: 10/02/19  1129     History   Chief Complaint Chief Complaint  Patient presents with  . Blister    HPI Holly Sparks is a 78 y.o. female with PMH significant for type II DM, diabetic foot ulcer, CVA, HTN, paroxysmal A. fib, and HLD who presents to the ED accompanied by her husband with a large blister on the medial aspect of her right ankle.  She does not know exactly how long the blister has been there, but likely no more than a few days.  She saw her PCP yesterday and was given doxycyline and cefuroxime.  They report that they want to get seen in the ED for further evaluation given that her exam yesterday was performed in their vehicle.  She denies any fevers, chills, nausea, vomiting, dizziness, body aches, or any other symptoms.  The blister is neither itchy, nor painful.  She does not believe that it is getting worse.  Husband reports that this has happened before several years ago and he believes that it resolved spontaneously but admits that is memory strained.  She has a follow-up appointment with her PCP in 2 days.     HPI  Past Medical History:  Diagnosis Date  . Diabetes (HCC)    Type 2 x 10 yrs  . Dysrhythmia    AFib  . GERD (gastroesophageal reflux disease)   . Grade I diastolic dysfunction 09/03/2018  . High cholesterol   . Hypertension    x 10 yrs  . Paroxysmal a-fib: Remote hx of afib 06/15/2015  . Stroke [facility])    no deficits    Patient Active Problem List   Diagnosis Date Noted  . 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 Grade I diastolic dysfunction 09/03/2018  . Hypomagnesemia 09/03/2018  . Paroxysmal atrial fibrillation (HCC) 08/31/2015  . Chronic anticoagulation 08/31/2015  . Type 2 diabetes mellitus with circulatory disorder (HCC) 08/31/2015  . 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 Cerebral infarction due  to embolism of left middle cerebral artery (HCC)   . Stroke-like symptoms 06/13/2015  . HLD (hyperlipidemia) 06/13/2015  . DDD (degenerative disc disease), cervical 06/13/2015  . Aphasia 06/13/2015  . Expressive aphasia   . Cerebral infarction due to unspecified mechanism   . Type 2 diabetes mellitus without complication (HCC)   . IBS (irritable bowel syndrome) 04/24/2013  . Diabetes (HCC) 04/24/2013  . Essential hypertension, benign 04/24/2013    Past Surgical History:  Procedure Laterality Date  . CATARACT EXTRACTION W/PHACO Left 06/06/2016   Procedure: CATARACT EXTRACTION PHACO AND INTRAOCULAR LENS PLACEMENT (IOC);  Surgeon: Gemma Payor, MD;  Location: [facility] ORS;  Service: Ophthalmology;  Laterality: Left;  CDE: 8.32  . COLONOSCOPY    . COLONOSCOPY N/A 05/08/2013   Procedure: COLONOSCOPY;  Surgeon: Malissa Hippo, MD;  Location: [facility] ENDO SUITE;  Service: Endoscopy;  Laterality: N/A;  1015  . NECK SURGERY     x 2 for spur and disc problems     OB History   No obstetric history on file.      Home Medications    Prior to Admission medications   Medication Sig Start Date End Date Taking? Authorizing Provider  acetaminophen (TYLENOL) 325 MG tablet Take 650 mg by mouth every 6 (six) hours as needed.    [provider]  Amino Acids-Protein Hydrolys (FEEDING SUPPLEMENT, PRO-STAT  SUGAR FREE 64,) LIQD Take 30 mLs by mouth 2 (two) times daily between meals.    [provider]  apixaban (ELIQUIS) 5 MG TABS tablet Take 1 tablet (5 mg total) by mouth 2 (two) times daily. 06/26/15   Angiulli, Mcarthur Rossetti, PA-C  atorvastatin (LIPITOR) 20 MG tablet Take 1 tablet (20 mg total) by mouth at bedtime. 06/26/15   Angiulli, Mcarthur Rossetti, PA-C  diltiazem (CARDIZEM CD) 120 MG 24 hr capsule Take 1 capsule (120 mg total) by mouth daily. 01/21/18   Bethann Berkshire, MD  lisinopril-hydrochlorothiazide (PRINZIDE,ZESTORETIC) 20-12.5 MG tablet Take 1 tablet by mouth daily. 07/02/18   [provider]   metFORMIN (GLUCOPHAGE) 1000 MG tablet Take 500 mg by mouth 2 (two) times daily with a meal.    [provider]  metoprolol tartrate (LOPRESSOR) 25 MG tablet Take 12.5 mg by mouth 2 (two) times daily.    [provider]  ondansetron (ZOFRAN) 4 MG tablet Take 4 mg by mouth every 6 (six) hours as needed for nausea or vomiting.    [provider]  pantoprazole (PROTONIX) 40 MG tablet Take 40 mg by mouth daily.    [provider]    Family History Family History  Problem Relation Age of Onset  . Diabetes Father     Social History Social History   Tobacco Use  . Smoking status: Never Smoker  . Smokeless tobacco: Never Used  Substance Use Topics  . Alcohol use: No    Alcohol/week: 0.0 standard drinks  . Drug use: No     Allergies   Patient has no known allergies.   Review of Systems Review of Systems  All other systems reviewed and are negative.    Physical Exam Updated Vital Signs BP (!) 153/90   Pulse (!) 112   Temp 97.9 F (36.6 C) (Oral)   Resp 16   Ht  (1.702 m)   SpO2 100%   BMI 20.30 kg/m   Physical Exam Vitals signs and nursing note reviewed. Exam conducted with a chaperone present.  Constitutional:      Appearance: Normal appearance.  HENT:     Head: Normocephalic and atraumatic.  Eyes:     General: No scleral icterus.    Conjunctiva/sclera: Conjunctivae normal.  Cardiovascular:     Rate and Rhythm: Normal rate.     Pulses: Normal pulses.  Pulmonary:     Effort: Pulmonary effort is normal. No respiratory distress.  Musculoskeletal: Normal range of motion.     Comments: Right ankle: There is a large, U shaped blister that wraps around the medial malleolus.  No surrounding erythema.  No swelling.  Negative Nikolsky.  Fluid shifts freely with palpation.  Nontender to palpation.  Distal pulses intact.  Sensation intact.  Skin:    General: Skin is dry.  Neurological:     Mental Status: She is alert.     GCS: GCS  eye subscore is 4. GCS verbal subscore is 5. GCS motor subscore is 6.  Psychiatric:        Mood and Affect: Mood normal.        Behavior: Behavior normal.        Thought Content: Thought content normal.          ED Treatments / Results  Labs (all labs ordered are listed, but only abnormal results are displayed) Labs Reviewed  BASIC METABOLIC PANEL - Abnormal; Notable for the following components:      Result Value  Glucose, Bld 139 (*)    BUN 24 (*)    GFR calc non Af Amer 56 (*)    All other components within normal limits  CBG MONITORING, ED - Abnormal; Notable for the following components:   Glucose-Capillary 136 (*)    All other components within normal limits  CBC WITH DIFFERENTIAL/PLATELET    EKG EKG Interpretation  Date/Time:  Wednesday October 02 2019 16:49:23 EST Ventricular Rate:  66 PR Interval:    QRS Duration: 107 QT Interval:  418 QTC Calculation: 438 R Axis:   -52 Text Interpretation: Atrial fibrillation Left anterior fascicular block Confirmed by Cathren LaineSteinl, Kevin ([id number]) on 10/02/2019 4:56:27 PM   Radiology Dg Ankle 2 Views Right  Result Date: 10/02/2019 CLINICAL DATA:  History of right ankle bolus change or blister with redness. EXAM: RIGHT ANKLE - 2 VIEW COMPARISON:  09/03/2018 FINDINGS: Posteromedial soft tissue swelling about the ankle with convex margins in the area of the reported blister. No underlying bony abnormality. Degenerative changes of and the ankle and mid foot are similar to the previous exam. IMPRESSION: 1. Soft tissue swelling corresponding to reported blisters. 2. No signs of acute bone finding or underlying changes to suggest osteomyelitis. Electronically Signed   By: Donzetta KohutGeoffrey  Wile M.D.   On: 10/02/2019 14:04    Procedures Procedures (including critical care time)  Medications Ordered in ED Medications  metoprolol tartrate (LOPRESSOR) tablet 25 mg (25 mg Oral Given 10/02/19 1545)  diltiazem (CARDIZEM) tablet 120 mg (120 mg Oral  Given 10/02/19 1545)     Initial Impression / Assessment and Plan / ED Course  I have reviewed the triage vital signs and the nursing notes.  Pertinent labs & imaging results that were available during my care of the patient were reviewed by me and considered in my medical decision making (see chart for details).       Suspect that she has an epidermal friction blister caused by her new pair shoes.  It is relatively fragile and flaccid rather than tense and rigid.  It has a linear pattern immediately inferior to medial malleolus that is consistent with the height of her shoe.  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  Denies any pruritus or pain.  Patient was admitted for osteomyelitis previously, but it I reviewed those notes and the pictures in the H&P by Dr. Kerry HoughMemon on 09/03/2018 showed bullae and surrounding tissue that was much more erythematous and swollen.   DG right ankle demonstrated swelling consistent with patient's blister, but no evidence of osteomyelitis, fracture, or other bony abnormalities.  CBC with differential was obtained and all within normal limits.  Her CBG was only mildly elevated to 136.  BMP was also interpreted and is unremarkable.  Patient developed A. fib while here in the ED.  Obtained EKG which shows A. fib with RVR.  She had not taken her diltiazem or her metoprolol this morning, only her antibiotics.  Provided her with her at home diltiazem and metoprolol here in the ED.   Negative Nikolsky sign and I do not suspect SJS or TM.  There is no systemic illness, erythema, swelling, pain, warmth, or other evidence of infection at this time.  Do not feel that any light microscopy, immunofluorescence, or microbiologic testing is warranted at this time.  Patient's  husband reports that this is happened before and if his memory shows him correctly it resolved spontaneously.  They also have good follow-up with their PCP in 2 days and are already on antibiotic therapy as prophylaxis.  Discussed option of sterilely draining, but patient declined.  Instructed her not to pop her blister independently.  Return to the ED or seek medical attention should he develop any fevers or chills, worsening redness or swelling of the ankle, inability to move the ankle or pain with ambulation, or any other new or worsening symptoms.   Final Clinical Impressions(s) / ED Diagnoses   Final diagnoses:  Bulla    ED Discharge Orders    None       Corena Herter, PA-C 10/02/19 1708    Ezequiel Essex, MD 10/02/19 1958

## 2019-10-02 NOTE — Discharge Instructions (Addendum)
Please follow-up with your PCP for your scheduled appointment in 2 days.  Continue taking your antibiotics, as prescribed.  Return to the ED or seek medical attention should he develop any fevers or chills, worsening redness or swelling of the ankle, inability to move the ankle or pain with ambulation, or any other new or worsening symptoms.

## 2019-10-08 ENCOUNTER — Other Ambulatory Visit: Payer: Self-pay

## 2019-10-08 ENCOUNTER — Encounter (HOSPITAL_COMMUNITY): Payer: Self-pay | Admitting: Physical Therapy

## 2019-10-08 ENCOUNTER — Ambulatory Visit (HOSPITAL_COMMUNITY): Payer: Medicare Other | Attending: Nurse Practitioner | Admitting: Physical Therapy

## 2019-10-08 DIAGNOSIS — S80211A Abrasion, right knee, initial encounter: Secondary | ICD-10-CM | POA: Diagnosis present

## 2019-10-08 DIAGNOSIS — M25571 Pain in right ankle and joints of right foot: Secondary | ICD-10-CM | POA: Insufficient documentation

## 2019-10-08 DIAGNOSIS — E08622 Diabetes mellitus due to underlying condition with other skin ulcer: Secondary | ICD-10-CM | POA: Insufficient documentation

## 2019-10-08 DIAGNOSIS — L089 Local infection of the skin and subcutaneous tissue, unspecified: Secondary | ICD-10-CM | POA: Diagnosis present

## 2019-10-08 DIAGNOSIS — L97319 Non-pressure chronic ulcer of right ankle with unspecified severity: Secondary | ICD-10-CM | POA: Diagnosis present

## 2019-10-08 NOTE — Therapy (Signed)
[place] [facility] [address] [place], [place], [address] Phone: [phone]   Fax:  [phone]  Wound Care Evaluation  Patient Details  Name: Holly Sparks MRN: [id number] Date of Birth: 03-18-1941 Referring Provider (PT): Dillard Essex   Encounter Date: 10/08/2019  PT End of Session - 10/08/19 1545    Visit Number  1    Number of Visits  8    Date for PT Re-Evaluation  11/05/19    Authorization Type  [employer] medicare    PT Start Time  1135    PT Stop Time  1225    PT Time Calculation (min)  50 min    Activity Tolerance  Patient limited by pain    Behavior During Therapy  [facility] for tasks assessed/performed       Past Medical History:  Diagnosis Date  . Diabetes (HCC)    Type 2 x 10 yrs  . Dysrhythmia    AFib  . GERD (gastroesophageal reflux disease)   . Grade I diastolic dysfunction 09/03/2018  . High cholesterol   . Hypertension    x 10 yrs  . Paroxysmal a-fib: Remote hx of afib 06/15/2015  . Stroke [facility])    no deficits    Past Surgical History:  Procedure Laterality Date  . CATARACT EXTRACTION W/PHACO Left 06/06/2016   Procedure: CATARACT EXTRACTION PHACO AND INTRAOCULAR LENS PLACEMENT (IOC);  Surgeon: Gemma Payor, MD;  Location: [facility] ORS;  Service: Ophthalmology;  Laterality: Left;  CDE: 8.32  . COLONOSCOPY    . COLONOSCOPY N/A 05/08/2013   Procedure: COLONOSCOPY;  Surgeon: Malissa Hippo, MD;  Location: [facility] ENDO SUITE;  Service: Endoscopy;  Laterality: N/A;  1015  . NECK SURGERY     x 2 for spur and disc problems    There were no vitals filed for this visit.    [facility] PT Assessment - 10/08/19 0001      Assessment   Medical Diagnosis  non healing wounds    Referring Provider (PT)  Dillard Essex    Onset Date/Surgical Date  09/29/19    Prior Therapy  none      Precautions   Precautions  Fall      Restrictions   Weight Bearing Restrictions  No      Balance Screen   Has the patient fallen in the past 6 months   Yes    How many times?  2    Has the patient had a decrease in activity level because of a fear of falling?   Yes   since pt had CVA years ago    Is the patient reluctant to leave their home because of a fear of falling?   Yes      Home Environment   Living Environment  Private residence    Type of Home  House      Prior Function   Level of Independence  Independent with household mobility with device    Vocation  Retired      Cognition   Overall Cognitive Status  Within Functional Limits for tasks assessed      Wound Therapy - 10/08/19 1503    Subjective  The following information is recieved by the pt's husband.  Holly Sparks has had a recurrent blister on her Rt foot since September.  She was admitted into the hospital early October for IV antibiotics and then went to SNF for continued care of her ulcer.  The wound reappeared approximatly two  weeks ago and is not improving therefore her MD referred her to therapy for wound care.  The husband states that she was sent here years ago for the same thing and we were able to heal the wound so he is hopeful.  The therapist questions three involved wounds located along the anterior aspect to the pt Rt knee, the husband states that his wife fell about four days ago causing these wounds.    order sent to the MD requesting treatment for ant. wounds    Patient and Family Stated Goals  wounds to heal     Date of Onset  09/29/19    Pain Scale  0-10    Pain Score  10-Worst pain ever    Pain Type  Chronic pain;Neuropathic pain    Pain Location  Ankle    Pain Orientation  Right;Medial    Pain Descriptors / Indicators  Burning    Pain Onset  On-going    Patients Stated Pain Goal  0    Pain Intervention(s)  Distraction    Multiple Pain Sites  No    Evaluation and Treatment Procedures Explained to Patient/Family  Yes    Evaluation and Treatment Procedures  agreed to    Wound Properties Date First Assessed: 10/08/19 Time First Assessed: 1139 Wound  Type: Diabetic ulcer Location: Ankle Location Orientation: Right;Medial Wound Description (Comments): Lg blister with congealed fluid; debrided which left the following  Present on Admission: Yes   Dressing Type  None    Dressing Changed  Changed    Dressing Status  None    Dressing Change Frequency  PRN    Site / Wound Assessment  Red   anticipate this to change as blister was just removed    % Wound base Red or Granulating  100%   anticipate this to change blister just removed   Wound Length (cm)  13 cm    Wound Width (cm)  7 cm    Wound Surface Area (cm^2)  91 cm^2    Drainage Amount  Copious   from burst blister    Drainage Description  Serous    Treatment  Cleansed;Debridement (Selective)    Wound Properties Date First Assessed: 10/08/19 Time First Assessed: 1136 Wound Type: Other (Comment) Location: Knee Location Orientation: Right;Anterior Wound Description (Comments): 3 abrasions from fall on RT knee most superior  Present on Admission: Yes   Dressing Type  None    Dressing Changed  Changed    Dressing Status  None    Dressing Change Frequency  PRN    Site / Wound Assessment  Yellow    % Wound base Red or Granulating  0%    % Wound base Yellow/Fibrinous Exudate  100%    Peri-wound Assessment  Erythema (blanchable)    Wound Length (cm)  1 cm    Wound Width (cm)  0.4 cm    Wound Surface Area (cm^2)  0.4 cm^2    Drainage Amount  None    Treatment  Cleansed    Wound Properties Date First Assessed: 10/08/19 Time First Assessed: 1140 Wound Type: Other (Comment) Location: Knee Location Orientation: Right Wound Description (Comments): middle and largest of abrasions  Present on Admission: Yes   Dressing Type  None    Dressing Change Frequency  PRN    Site / Wound Assessment  Yellow    % Wound base Red or Granulating  0%    % Wound base Yellow/Fibrinous Exudate  100%    Peri-wound  Assessment  Erythema (blanchable)    Wound Length (cm)  3.2 cm    Wound Width (cm)  4.8 cm    Wound  Surface Area (cm^2)  15.36 cm^2    Drainage Amount  None    Treatment  Cleansed    Wound Properties Date First Assessed: 09/03/18 Time First Assessed: 2245 Wound Type: Diabetic ulcer Location: Foot Location Orientation: Right Wound Description (Comments): Top of R foot Present on Admission: Yes   Wound Properties Date First Assessed: 10/08/19 Time First Assessed: 1142 Wound Type: Other (Comment) Location: Knee Location Orientation: Right Wound Description (Comments): Most inferior of three abrasions  Present on Admission: Yes   Dressing Type  None    Dressing Changed  Other (Comment)    Dressing Status  None    Dressing Change Frequency  PRN    Site / Wound Assessment  Yellow    % Wound base Red or Granulating  0%    % Wound base Yellow/Fibrinous Exudate  100%    Peri-wound Assessment  Erythema (blanchable)    Wound Length (cm)  1.5 cm    Wound Width (cm)  1 cm    Wound Surface Area (cm^2)  1.5 cm^2    Drainage Amount  None    Treatment  Cleansed    Selective Debridement - Location  medial ankle wound     Selective Debridement - Tools Used  Forceps;Scissors    Selective Debridement - Tissue Removed  dead skin     Wound Therapy - Clinical Statement  Ms. Petrosky is a 78 yo female who has DM and a history of a CVA who is being referred to skilled physical therapy due to a recurrent diabetic ulcer on the medial aspect of her right ankle. Ms. Sawyer has already been hospitalized in October for IV antibiotic followed by admission to [facility] to blisters on this leg.   She is currently on two antibiotics.  She  fell approximately a week ago and sustained three wounds along the anterior aspect of her RT knee that appear to be infected.  These wounds need debridement therefore an order to treat these wounds was sent to the patients MD.   Ms. Shackett will benefit from skilled PT to improve the pt healing enviornment and decrease her risk of infection and promote a healing enviornment.     Wound Therapy -  Functional Problem List  Pain with walking      Factors Delaying/Impairing Wound Healing  Diabetes Mellitus;Altered sensation;Infection - systemic/local;Immobility;Multiple medical problems;Polypharmacy    Hydrotherapy Plan  Debridement;Dressing change;Patient/family education    Wound Therapy - Frequency  2X / week    Wound Therapy - Current Recommendations  PT    Wound Plan  Pt to be seen two times a weel for four weeks for cleansing, moisturizing, debridement and dressing change to all wounds if okayed by MD     Dressing   xeroform, 4x4, kerlix, coband and netting .              Objective measurements completed on examination: See above findings.            PT Education - 10/08/19 1552    Education Details  If possible keep dressing on until next visit, keep dressing dry.    Person(s) Educated  Patient;Spouse    Methods  Explanation    Comprehension  Verbalized understanding       PT Short Term Goals - 10/08/19 1553      PT  SHORT TERM GOAL #1   Title  Pt wounds to be 100% granulated to prevent infection    Time  2    Period  Weeks    Status  New    Target Date  10/22/19      PT SHORT TERM GOAL #2   Title  Pt knee abrasions to be healed.    Time  2    Period  Weeks    Status  New        PT Long Term Goals - 10/08/19 1554      PT LONG TERM GOAL #1   Title  Wound on medial aspect of Rt ankle to be healed    Time  4    Period  Weeks    Status  New    Target Date  11/05/19           Plan - 10/08/19 1549    Clinical Impression Statement  see above    Personal Factors and Comorbidities  Comorbidity 2;Fitness    Comorbidities  DM, CVA    Examination-Activity Limitations  Dressing;Locomotion Level    Examination-Participation Restrictions  Other    Stability/Clinical Decision Making  Evolving/Moderate complexity    Clinical Decision Making  Low    Rehab Potential  Good    PT Frequency  2x / week    PT Duration  4 weeks    PT  Treatment/Interventions  Other (comment)   cleanse, moisturize, debridement and dressing changes   PT Next Visit Plan  See if MD has returned new order authorizing treatment on knee abrasions, debride and dress as appropriate to maintain a moist but not wet wound environment free of necrotic tissue.       Patient will benefit from skilled therapeutic intervention in order to improve the following deficits and impairments:  Pain, Decreased skin integrity  Visit Diagnosis: Pain in right ankle and joints of right foot  Diabetic ulcer of right ankle associated with diabetes mellitus due to underlying condition (HCC)  Abrasion of knee, infected, right, initial encounter    Problem List Patient Active Problem List   Diagnosis Date Noted  . 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 Grade I diastolic dysfunction 09/03/2018  . Hypomagnesemia 09/03/2018  . Paroxysmal atrial fibrillation (HCC) 08/31/2015  . Chronic anticoagulation 08/31/2015  . Type 2 diabetes mellitus with circulatory disorder (HCC) 08/31/2015  . 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 Cerebral infarction due to embolism of left middle cerebral artery (HCC)   . Stroke-like symptoms 06/13/2015  . HLD (hyperlipidemia) 06/13/2015  . DDD (degenerative disc disease), cervical 06/13/2015  . Aphasia 06/13/2015  . Expressive aphasia   . Cerebral infarction due to unspecified mechanism   . Type 2 diabetes mellitus without complication (HCC)   . IBS (irritable bowel syndrome) 04/24/2013  . Diabetes (HCC) 04/24/2013  . Essential hypertension, benign 04/24/2013    Virgina Organynthia Serah Nicoletti, PT CLT [phone] 10/08/2019, 3:57 PM  [place] [facility] [address] [place], [place], [address] Phone: [phone]   Fax:  [phone]  Name: Lance BoschCatherine P Dominski MRN: [id number] Date of Birth: Apr 08, 1941

## 2019-10-10 ENCOUNTER — Other Ambulatory Visit: Payer: Self-pay

## 2019-10-10 ENCOUNTER — Ambulatory Visit (HOSPITAL_COMMUNITY): Payer: Medicare Other | Admitting: Physical Therapy

## 2019-10-10 ENCOUNTER — Encounter (HOSPITAL_COMMUNITY): Payer: Self-pay | Admitting: Physical Therapy

## 2019-10-10 DIAGNOSIS — M25571 Pain in right ankle and joints of right foot: Secondary | ICD-10-CM

## 2019-10-10 DIAGNOSIS — L089 Local infection of the skin and subcutaneous tissue, unspecified: Secondary | ICD-10-CM

## 2019-10-10 DIAGNOSIS — S80211A Abrasion, right knee, initial encounter: Secondary | ICD-10-CM

## 2019-10-10 DIAGNOSIS — E08622 Diabetes mellitus due to underlying condition with other skin ulcer: Secondary | ICD-10-CM

## 2019-10-10 DIAGNOSIS — L97319 Non-pressure chronic ulcer of right ankle with unspecified severity: Secondary | ICD-10-CM

## 2019-10-10 NOTE — Therapy (Signed)
[facility] [address] [place], [place], [address] Phone: [phone]   Fax:  [phone]  Wound Care Therapy  Patient Details  Name: Holly Sparks MRN: [id number] Date of Birth: [birth year] Referring Provider (PT): Dillard EssexAlison Marshall   Encounter Date: 10/10/2019  PT End of Session - 10/10/19 1155    Visit Number  2    Number of Visits  8    Date for PT Re-Evaluation  11/05/19    Authorization Type  [employer] medicare    Authorization - Visit Number  2    Authorization - Number of Visits  8    PT Start Time  1050    PT Stop Time  1120    PT Time Calculation (min)  30 min    Activity Tolerance  Patient limited by pain    Behavior During Therapy  [facility] assessed/per[facility]       Past Medical History:  Diagnosis Date  . Diabetes (HCC)    Type 2 x 10 yrs  . Dysrhythmia    AFib  . GERD (gastroesophageal reflux disease)   . Grade I diastolic dysfunction 09/03/2018  . High cholesterol   . Hypertension    x 10 yrs  . Paroxysmal a-fib: Remote hx of afib 06/15/2015  . Stroke [facility](HCC)    no deficits    Past Surgical History:  Procedure Laterality Date  . CATARACT EXTRACTION W/PHACO Left 06/06/2016   Procedure: CATARACT EXTRACTION PHACO AND INTRAOCULAR LENS PLACEMENT (IOC);  Surgeon: Gemma PayorKerry Hunt, MD;  Location: [facility] ORS;  Service: Ophthalmology;  Laterality: Left;  CDE: 8.32  . COLONOSCOPY    . COLONOSCOPY N/A 05/08/2013   Procedure: COLONOSCOPY;  Surgeon: Malissa HippoNajeeb U Rehman, MD;  Location: [facility];  Service: Endoscopy;  Laterality: N/A;  1015  . NECK SURGERY     x 2 for spur and disc problems    There were no vitals filed for this visit.              Wound Therapy - 10/10/19 1147    Subjective  PT's husband states that the pt started picking at the dressing and before he knew it the bandaging was off her leg.  PT states, "Do you know why they took the bandage off"  PT states that she did not take the bandage off.      order sent to the MD requesting treatment for ant. wounds    Patient and Family Stated Goals  wounds to heal     Date of Onset  09/29/19    Pain Scale  Faces    Faces Pain Scale  Hurts whole lot    Pain Type  Chronic pain    Pain Location  Ankle    Pain Orientation  Right;Medial    Pain Descriptors / Indicators  Burning    Pain Onset  On-going    Patients Stated Pain Goal  0    Pain Intervention(s)  Emotional support    Multiple Pain Sites  No    Evaluation and Treatment Procedures Explained to Patient/Family  Yes    Evaluation and Treatment Procedures  agreed to    Wound Properties Date First Assessed: 10/08/19 Time First Assessed: 1139 Wound Type: Diabetic ulcer Location: Ankle Location Orientation: Right;Medial Wound Description (Comments): Lg blister with congealed fluid; debrided which left the following  Present on Admission: Yes   Dressing Type  None    Dressing Status  None    Dressing  Change Frequency  PRN    Site / Wound Assessment  Red   anticipate this to change as blister was just removed    % Wound base Red or Granulating  100%   anticipate this to change blister just removed   Drainage Amount  Minimal   from burst blister    Drainage Description  Serous    Treatment  Cleansed;Debridement (Selective)    Wound Properties Date First Assessed: 10/08/19 Time First Assessed: 1136 Wound Type: Other (Comment) Location: Knee Location Orientation: Right;Anterior Wound Description (Comments): 3 abrasions from fall on RT knee most superior  Present on Admission: Yes   Dressing Type  None    Dressing Status  None    Dressing Change Frequency  PRN    Site / Wound Assessment  Yellow    % Wound base Red or Granulating  0%    % Wound base Yellow/Fibrinous Exudate  100%    Peri-wound Assessment  Erythema (blanchable)    Drainage Amount  None    Treatment  Cleansed    Wound Properties Date First Assessed: 10/08/19 Time First Assessed: 1140 Wound Type: Other (Comment) Location: Knee  Location Orientation: Right Wound Description (Comments): middle and largest of abrasions  Present on Admission: Yes   Dressing Type  None    Dressing Change Frequency  PRN    Site / Wound Assessment  Yellow    % Wound base Red or Granulating  0%    % Wound base Yellow/Fibrinous Exudate  100%    Peri-wound Assessment  Erythema (blanchable)    Drainage Amount  None    Treatment  Cleansed    Wound Properties Date First Assessed: 10/08/19 Time First Assessed: 1142 Wound Type: Other (Comment) Location: Knee Location Orientation: Right Wound Description (Comments): Most inferior of three abrasions  Present on Admission: Yes   Dressing Type  None    Dressing Status  None    Dressing Change Frequency  PRN    Site / Wound Assessment  Yellow    % Wound base Red or Granulating  0%    % Wound base Yellow/Fibrinous Exudate  100%    Peri-wound Assessment  Erythema (blanchable)    Drainage Amount  None    Treatment  Cleansed    Selective Debridement - Location  medial ankle wound     Selective Debridement - Tools Used  Forceps;Scissors    Selective Debridement - Tissue Removed  dead skin along periphery of wound.      Wound Therapy - Clinical Statement  Holly Sparks comes to department without any  dressing on her wounds.  Therapist explained to both pt and husband that it is important to keep the wounds covered at all times.  Husband vocalized understanding.  Therapist has not recieved order to begin debridement along anterior aspect of knee.     Wound Therapy - Functional Problem List  Pain with walking      Factors Delaying/Impairing Wound Healing  Diabetes Mellitus;Altered sensation;Infection - systemic/local;Immobility;Multiple medical problems;Polypharmacy    Hydrotherapy Plan  Debridement;Dressing change;Patient/family education    Wound Therapy - Frequency  2X / week    Wound Therapy - Current Recommendations  PT    Wound Plan  Pt to be seen two times a weel for four weeks for cleansing,  moisturizing, debridement and dressing change to all wounds if okayed by MD     Dressing   xeroform, kerlix, cotton at ankle, coband and netting .  PT Short Term Goals - 10/10/19 1157      PT SHORT TERM GOAL #1   Title  Pt wounds to be 100% granulated to prevent infection    Time  2    Period  Weeks    Status  On-going    Target Date  10/22/19      PT SHORT TERM GOAL #2   Title  Pt knee abrasions to be healed.    Time  2    Period  Weeks    Status  On-going        PT Long Term Goals - 10/10/19 1156      PT LONG TERM GOAL #1   Title  Wound on medial aspect of Rt ankle to be healed    Time  4    Period  Weeks    Status  On-going            Plan - 10/10/19 1156    Clinical Impression Statement  see above    Personal Factors and Comorbidities  Comorbidity 2;Fitness    Comorbidities  DM, CVA    Examination-Activity Limitations  Dressing;Locomotion Level    Examination-Participation Restrictions  Other    Stability/Clinical Decision Making  Evolving/Moderate complexity    Rehab Potential  Good    PT Frequency  2x / week    PT Duration  4 weeks    PT Treatment/Interventions  Other (comment)   cleanse, moisturize, debridement and dressing changes   PT Next Visit Plan  See if MD has returned new order authorizing treatment on knee abrasions, debride and dress as appropriate to maintain a moist but not wet wound environment free of necrotic tissue.       Patient will benefit from skilled therapeutic intervention in order to improve the following deficits and impairments:  Pain, Decreased skin integrity  Visit Diagnosis: Pain in right ankle and joints of right foot  Diabetic ulcer of right ankle associated with diabetes mellitus due to underlying condition (HCC)  Abrasion of knee, infected, right, initial encounter     Problem List Patient Active Problem List   Diagnosis Date Noted  . Diabetic ulcer of right foot due to type 2 diabetes  mellitus (HCC) 09/03/2018  . Grade I diastolic dysfunction 09/03/2018  . Hypomagnesemia 09/03/2018  . Paroxysmal atrial fibrillation (HCC) 08/31/2015  . Chronic anticoagulation 08/31/2015  . Type 2 diabetes mellitus with circulatory disorder (HCC) 08/31/2015  . 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 Cerebral infarction due to embolism of left middle cerebral artery (HCC)   . Stroke-like symptoms 06/13/2015  . HLD (hyperlipidemia) 06/13/2015  . DDD (degenerative disc disease), cervical 06/13/2015  . Aphasia 06/13/2015  . Expressive aphasia   . Cerebral infarction due to unspecified mechanism   . Type 2 diabetes mellitus without complication (HCC)   . IBS (irritable bowel syndrome) 04/24/2013  . Diabetes (HCC) 04/24/2013  . Essential hypertension, benign 04/24/2013   Virgina Organ, PT CLT [phone] 10/10/2019, 11:58 AM  [place] [facility] [address] [place], [place], [address] Phone: [phone]   Fax:  [phone]  Name: Holly Sparks MRN: [id number] Date of Birth: 02-24-1941

## 2019-10-15 ENCOUNTER — Other Ambulatory Visit: Payer: Self-pay

## 2019-10-15 ENCOUNTER — Ambulatory Visit (HOSPITAL_COMMUNITY): Payer: Medicare Other | Admitting: Physical Therapy

## 2019-10-15 DIAGNOSIS — S80211A Abrasion, right knee, initial encounter: Secondary | ICD-10-CM

## 2019-10-15 DIAGNOSIS — E08622 Diabetes mellitus due to underlying condition with other skin ulcer: Secondary | ICD-10-CM

## 2019-10-15 DIAGNOSIS — L089 Local infection of the skin and subcutaneous tissue, unspecified: Secondary | ICD-10-CM

## 2019-10-15 DIAGNOSIS — M25571 Pain in right ankle and joints of right foot: Secondary | ICD-10-CM

## 2019-10-15 NOTE — Therapy (Signed)
[facility] [address] [place], [place], [address] Phone: [phone]   Fax:  [phone]  Wound Care Therapy  Patient Details  Name: Holly Sparks MRN: [id number] Date of Birth: 1941/07/07 Referring Provider (PT): Dillard EssexAlison Marshall   Encounter Date: 10/15/2019  PT End of Session - 10/15/19 1506    Visit Number  3    Number of Visits  8    Date for PT Re-Evaluation  11/05/19    Authorization Type  [employer] medicare    Authorization - Visit Number  3    Authorization - Number of Visits  8    PT Start Time  1048    PT Stop Time  1115    PT Time Calculation (min)  27 min    Activity Tolerance  Patient limited by pain    Behavior During Therapy  [facility] for tasks assessed/performed       Past Medical History:  Diagnosis Date  . Diabetes (HCC)    Type 2 x 10 yrs  . Dysrhythmia    AFib  . GERD (gastroesophageal reflux disease)   . Grade I diastolic dysfunction 09/03/2018  . High cholesterol   . Hypertension    x 10 yrs  . Paroxysmal a-fib: Remote hx of afib 06/15/2015  . Stroke [facility](HCC)    no deficits    Past Surgical History:  Procedure Laterality Date  . CATARACT EXTRACTION W/PHACO Left 06/06/2016   Procedure: CATARACT EXTRACTION PHACO AND INTRAOCULAR LENS PLACEMENT (IOC);  Surgeon: Gemma PayorKerry Hunt, MD;  Location: [facility];  Service: Ophthalmology;  Laterality: Left;  CDE: 8.32  . COLONOSCOPY    . COLONOSCOPY N/A 05/08/2013   Procedure: COLONOSCOPY;  Surgeon: Malissa HippoNajeeb U Rehman, MD;  Location: [facility];  Service: Endoscopy;  Laterality: N/A;  1015  . NECK SURGERY     x 2 for spur and disc problems    There were no vitals filed for this visit.              Wound Therapy - 10/15/19 1456    Subjective  PT's husband states that the pt started picking at the dressing and before he knew it the bandaging was off her leg.  PT states, "Do you know why they took the bandage off"  PT states that she did not take the bandage off.      order sent to the MD requesting treatment for ant. wounds    Patient and Family Stated Goals  wounds to heal     Date of Onset  09/29/19    Evaluation and Treatment Procedures Explained to Patient/Family  Yes    Evaluation and Treatment Procedures  agreed to    Wound Properties Date First Assessed: 10/08/19 Time First Assessed: 1139 Wound Type: Diabetic ulcer Location: Ankle Location Orientation: Right;Medial Wound Description (Comments): Lg blister with congealed fluid; debrided which left the following  Present on Admission: Yes   Dressing Type  Compression wrap;Impregnated gauze (bismuth)    Dressing Changed  Changed    Dressing Status  None    Dressing Change Frequency  PRN    Site / Wound Assessment  Red   anticipate this to change as blister was just removed    % Wound base Red or Granulating  100%   anticipate this to change blister just removed   Drainage Amount  Minimal   from burst blister    Drainage Description  Serous    Treatment  Cleansed;Debridement (Selective)  Wound Properties Date First Assessed: 10/08/19 Time First Assessed: 1136 Wound Type: Other (Comment) Location: Knee Location Orientation: Right;Anterior Wound Description (Comments): 3 abrasions from fall on RT knee most superior  Present on Admission: Yes   Dressing Type  Compression wrap;Impregnated gauze (bismuth)    Dressing Changed  Changed    Dressing Status  None    Dressing Change Frequency  PRN    Site / Wound Assessment  Yellow    % Wound base Red or Granulating  100%    % Wound base Yellow/Fibrinous Exudate  0%    Peri-wound Assessment  Erythema (blanchable)    Drainage Amount  Minimal    Treatment  Cleansed    Wound Properties Date First Assessed: 10/08/19 Time First Assessed: 1140 Wound Type: Other (Comment) Location: Knee Location Orientation: Right Wound Description (Comments): middle and largest of abrasions  Present on Admission: Yes   Dressing Type  Compression wrap;Impregnated gauze  (bismuth)    Dressing Changed  Changed    Dressing Change Frequency  PRN    Site / Wound Assessment  Yellow    % Wound base Red or Granulating  100%    % Wound base Yellow/Fibrinous Exudate  0%    Peri-wound Assessment  Erythema (blanchable)    Drainage Amount  None    Treatment  Cleansed    Wound Properties Date First Assessed: 10/08/19 Time First Assessed: 1142 Wound Type: Other (Comment) Location: Knee Location Orientation: Right Wound Description (Comments): Most inferior of three abrasions  Present on Admission: Yes   Dressing Type  Compression wrap;Impregnated gauze (bismuth)    Dressing Changed  Reinforced    Dressing Status  None    Dressing Change Frequency  PRN    Site / Wound Assessment  Yellow    % Wound base Red or Granulating  100%    % Wound base Yellow/Fibrinous Exudate  0%    Peri-wound Assessment  Erythema (blanchable)    Drainage Amount  None    Treatment  Cleansed    Selective Debridement - Location  medial ankle, perimeter    Selective Debridement - Tools Used  Forceps    Selective Debridement - Tissue Removed  dead skin along periphery of wound.      Wound Therapy - Clinical Statement  Dressings intact with noted congestion around knee and discomfort around back of knee where dressing had bunched up.  All remainind "scab" and dead tissue removed when xeroform was taken away from skin revealing 100% granulation.  Cleansed LE well, applied vaseline to perimeter and redressed wtih xerform, kerlix, cotton around the ankle and coban.  Adhered most superior dressing at knee with medipore and only wrapped to knee without crossing over up into thigh.  Pt reported overall comfort.  Worked with pateint and spouse on safety with transfers (pt tends to pull on walker rather than push with UE's) and also taking larger steps as she stumbled several times when she continued to shuffle Rt LE rather than fully lifting and stepping forward.  Pt would not correct this despite constant cues.   Spouse aware and will work on this at home as well.     Wound Therapy - Functional Problem List  Pain with walking      Factors Delaying/Impairing Wound Healing  Diabetes Mellitus;Altered sensation;Infection - systemic/local;Immobility;Multiple medical problems;Polypharmacy    Hydrotherapy Plan  Debridement;Dressing change;Patient/family education    Wound Therapy - Frequency  2X / week    Wound Therapy - Current Recommendations  PT    Wound Plan  Pt to be seen two times a weel for four weeks for cleansing, moisturizing, debridement and dressing change to all wounds if okayed by MD   Measure wounds weekly.    Dressing   xeroform, kerlix, cotton at ankle, coban and netting .               PT Education - 10/15/19 1455    Education Details  advancing Rt LE fully with ambulation (lifting not shuffling), standing from chair using UE/general safety    Person(s) Educated  Spouse;Patient    Methods  Explanation;Demonstration;Tactile cues;Verbal cues    Comprehension  Verbalized understanding;Returned demonstration;Verbal cues required;Tactile cues required;Need further instruction       PT Short Term Goals - 10/10/19 1157      PT SHORT TERM GOAL #1   Title  Pt wounds to be 100% granulated to prevent infection    Time  2    Period  Weeks    Status  On-going    Target Date  10/22/19      PT SHORT TERM GOAL #2   Title  Pt knee abrasions to be healed.    Time  2    Period  Weeks    Status  On-going        PT Long Term Goals - 10/10/19 1156      PT LONG TERM GOAL #1   Title  Wound on medial aspect of Rt ankle to be healed    Time  4    Period  Weeks    Status  On-going              Patient will benefit from skilled therapeutic intervention in order to improve the following deficits and impairments:     Visit Diagnosis: Diabetic ulcer of right ankle associated with diabetes mellitus due to underlying condition (HCC)  Abrasion of knee, infected, right, initial  encounter  Pain in right ankle and joints of right foot     Problem List Patient Active Problem List   Diagnosis Date Noted  . 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 Grade I diastolic dysfunction 09/03/2018  . Hypomagnesemia 09/03/2018  . Paroxysmal atrial fibrillation (HCC) 08/31/2015  . Chronic anticoagulation 08/31/2015  . Type 2 diabetes mellitus with circulatory disorder (HCC) 08/31/2015  . 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 Cerebral infarction due to embolism of left middle cerebral artery (HCC)   . Stroke-like symptoms 06/13/2015  . HLD (hyperlipidemia) 06/13/2015  . DDD (degenerative disc disease), cervical 06/13/2015  . Aphasia 06/13/2015  . Expressive aphasia   . Cerebral infarction due to unspecified mechanism   . Type 2 diabetes mellitus without complication (HCC)   . IBS (irritable bowel syndrome) 04/24/2013  . Diabetes (HCC) 04/24/2013  . Essential hypertension, benign 04/24/2013   Lurena Nida, PTA/CLT [phone]  Lurena Nida 10/15/2019, 3:07 PM  [facility] [address] [place], [place], [address] Phone: [phone]   Fax:  [phone]  Name: Holly Sparks MRN: [id number] Date of Birth: 1941-04-23

## 2019-10-17 ENCOUNTER — Ambulatory Visit (HOSPITAL_COMMUNITY): Payer: Medicare Other | Admitting: Physical Therapy

## 2019-10-17 ENCOUNTER — Other Ambulatory Visit: Payer: Self-pay

## 2019-10-17 DIAGNOSIS — L97319 Non-pressure chronic ulcer of right ankle with unspecified severity: Secondary | ICD-10-CM

## 2019-10-17 DIAGNOSIS — M25571 Pain in right ankle and joints of right foot: Secondary | ICD-10-CM | POA: Diagnosis not present

## 2019-10-17 DIAGNOSIS — E08622 Diabetes mellitus due to underlying condition with other skin ulcer: Secondary | ICD-10-CM

## 2019-10-17 DIAGNOSIS — L089 Local infection of the skin and subcutaneous tissue, unspecified: Secondary | ICD-10-CM

## 2019-10-17 DIAGNOSIS — S80211A Abrasion, right knee, initial encounter: Secondary | ICD-10-CM

## 2019-10-17 NOTE — Therapy (Signed)
[facility] [address] [place], [place], [address] Phone: [phone]   Fax:  [phone]  Wound Care Therapy  Patient Details  Name: Holly Sparks MRN: [id number] Date of Birth: 1941/06/12 Referring Provider (PT): Dillard Essex   Encounter Date: 10/17/2019  PT End of Session - 10/17/19 1607    Visit Number  4    Number of Visits  8    Date for PT Re-Evaluation  11/05/19    Authorization Type  [employer] medicare    Authorization - Visit Number  4    Authorization - Number of Visits  8    PT Start Time  1130    PT Stop Time  1200    PT Time Calculation (min)  30 min    Activity Tolerance  Patient limited by pain    Behavior During Therapy  [facility] for tasks assessed/performed       Past Medical History:  Diagnosis Date  . Diabetes (HCC)    Type 2 x 10 yrs  . Dysrhythmia    AFib  . GERD (gastroesophageal reflux disease)   . Grade I diastolic dysfunction 09/03/2018  . High cholesterol   . Hypertension    x 10 yrs  . Paroxysmal a-fib: Remote hx of afib 06/15/2015  . Stroke [facility])    no deficits    Past Surgical History:  Procedure Laterality Date  . CATARACT EXTRACTION W/PHACO Left 06/06/2016   Procedure: CATARACT EXTRACTION PHACO AND INTRAOCULAR LENS PLACEMENT (IOC);  Surgeon: Gemma Payor, MD;  Location: [facility];  Service: Ophthalmology;  Laterality: Left;  CDE: 8.32  . COLONOSCOPY    . COLONOSCOPY N/A 05/08/2013   Procedure: COLONOSCOPY;  Surgeon: Malissa Hippo, MD;  Location[facility];  Service: Endoscopy;  Laterality: N/A;  1015  . NECK SURGERY     x 2 for spur and disc problems    There were no vitals filed for this visit.              Wound Therapy - 10/17/19 1600    Subjective  pt states her dressing fell down (it really did not).  No other issues.   order sent to the MD requesting treatment for ant. wounds    Patient and Family Stated Goals  wounds to heal     Date of Onset  09/29/19    Evaluation  and Treatment Procedures Explained to Patient/Family  Yes    Evaluation and Treatment Procedures  agreed to    Wound Properties Date First Assessed: 10/08/19 Time First Assessed: 1139 Wound Type: Diabetic ulcer Location: Ankle Location Orientation: Right;Medial Wound Description (Comments): Lg blister with congealed fluid; debrided which left the following  Present on Admission: Yes   Dressing Type  Compression wrap;Impregnated gauze (bismuth)    Dressing Changed  Changed    Dressing Status  None    Dressing Change Frequency  PRN    Site / Wound Assessment  Red   anticipate this to change as blister was just removed    % Wound base Red or Granulating  100%   anticipate this to change blister just removed   Wound Length (cm)  13 cm   was 13   Wound Width (cm)  5.7 cm   was 7   Wound Depth (cm)  0 cm    Wound Volume (cm^3)  0 cm^3    Wound Surface Area (cm^2)  74.1 cm^2    Drainage Amount  Minimal  from burst blister    Drainage Description  Serous    Treatment  Cleansed;Debridement (Selective)    Wound Properties Date First Assessed: 10/08/19 Time First Assessed: 1136 Wound Type: Other (Comment) Location: Knee Location Orientation: Right;Anterior Wound Description (Comments): 3 abrasions from fall on RT knee most superior  Present on Admission: Yes Final Assessment Date: 10/17/19   Dressing Type  --    Dressing Status  --    Dressing Change Frequency  --    Site / Wound Assessment  --    % Wound base Red or Granulating  --    % Wound base Yellow/Fibrinous Exudate  --    Peri-wound Assessment  --    Drainage Amount  --    Wound Properties Date First Assessed: 10/08/19 Time First Assessed: 1140 Wound Type: Other (Comment) Location: Knee Location Orientation: Right Wound Description (Comments): middle and largest of abrasions  Present on Admission: Yes   Dressing Type  Compression wrap;Impregnated gauze (bismuth)    Dressing Change Frequency  PRN    Site / Wound Assessment  Yellow     % Wound base Red or Granulating  100%    % Wound base Yellow/Fibrinous Exudate  0%    Peri-wound Assessment  Erythema (blanchable)    Wound Length (cm)  2.5 cm   was 3.2   Wound Width (cm)  4 cm   was 4.8   Wound Depth (cm)  0 cm    Wound Volume (cm^3)  0 cm^3    Wound Surface Area (cm^2)  10 cm^2    Drainage Amount  Scant    Drainage Description  Serous    Treatment  Cleansed    Wound Properties Date First Assessed: 10/08/19 Time First Assessed: 1142 Wound Type: Other (Comment) Location: Knee Location Orientation: Right Wound Description (Comments): Most inferior of three abrasions  Present on Admission: Yes Final Assessment Date: 10/17/19   Dressing Type  --    Dressing Status  --    Dressing Change Frequency  --    Site / Wound Assessment  --    % Wound base Red or Granulating  --    % Wound base Yellow/Fibrinous Exudate  --    Peri-wound Assessment  --    Drainage Amount  --    Selective Debridement - Location  medial ankle, perimeter    Selective Debridement - Tools Used  Forceps    Selective Debridement - Tissue Removed  dead skin along periphery of wound.      Wound Therapy - Clinical Statement  More improved today with increased approximation and granualtion.  The medial ankle wound with scattered opened areas but mostly closed.  Only the middle wound (largest one) remains at knee and was cleansed and redressed with xerform.  Continued with kerlex and coban.      Wound Therapy - Functional Problem List  Pain with walking      Factors Delaying/Impairing Wound Healing  Diabetes Mellitus;Altered sensation;Infection - systemic/local;Immobility;Multiple medical problems;Polypharmacy    Hydrotherapy Plan  Debridement;Dressing change;Patient/family education    Wound Therapy - Frequency  2X / week    Wound Therapy - Current Recommendations  PT    Wound Plan  Pt to be seen two times a weel for four weeks for cleansing, moisturizing, debridement and dressing change to all wounds if  okayed by MD   Measure wounds weekly.    Dressing   xeroform, kerlix, cotton at ankle, coban and netting .  PT Short Term Goals - 10/10/19 1157      PT SHORT TERM GOAL #1   Title  Pt wounds to be 100% granulated to prevent infection    Time  2    Period  Weeks    Status  On-going    Target Date  10/22/19      PT SHORT TERM GOAL #2   Title  Pt knee abrasions to be healed.    Time  2    Period  Weeks    Status  On-going        PT Long Term Goals - 10/10/19 1156      PT LONG TERM GOAL #1   Title  Wound on medial aspect of Rt ankle to be healed    Time  4    Period  Weeks    Status  On-going              Patient will benefit from skilled therapeutic intervention in order to improve the following deficits and impairments:     Visit Diagnosis: Diabetic ulcer of right ankle associated with diabetes mellitus due to underlying condition (HCC)  Abrasion of knee, infected, right, initial encounter  Pain in right ankle and joints of right foot     Problem List Patient Active Problem List   Diagnosis Date Noted  . 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 Grade I diastolic dysfunction 09/03/2018  . Hypomagnesemia 09/03/2018  . Paroxysmal atrial fibrillation (HCC) 08/31/2015  . Chronic anticoagulation 08/31/2015  . Type 2 diabetes mellitus with circulatory disorder (HCC) 08/31/2015  . 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 Cerebral infarction due to embolism of left middle cerebral artery (HCC)   . Stroke-like symptoms 06/13/2015  . HLD (hyperlipidemia) 06/13/2015  . DDD (degenerative disc disease), cervical 06/13/2015  . Aphasia 06/13/2015  . Expressive aphasia   . Cerebral infarction due to unspecified mechanism   . Type 2 diabetes mellitus without complication (HCC)   . IBS (irritable bowel syndrome) 04/24/2013  . Diabetes (HCC) 04/24/2013  . Essential hypertension, benign  04/24/2013   Holly Sparks, PTA/CLT [phone]  Holly Sparks, Holly Sparks 10/17/2019, 4:08 PM  [facility] [address] [place], [place], [address] Phone: [phone]   Fax:  [phone]  Name: Holly Sparks MRN: [id number] Date of Birth: 29-Apr-1941

## 2019-10-22 ENCOUNTER — Other Ambulatory Visit: Payer: Self-pay

## 2019-10-22 ENCOUNTER — Ambulatory Visit (HOSPITAL_COMMUNITY): Payer: Medicare Other | Admitting: Physical Therapy

## 2019-10-22 DIAGNOSIS — E08622 Diabetes mellitus due to underlying condition with other skin ulcer: Secondary | ICD-10-CM

## 2019-10-22 DIAGNOSIS — M25571 Pain in right ankle and joints of right foot: Secondary | ICD-10-CM | POA: Diagnosis not present

## 2019-10-22 DIAGNOSIS — L97319 Non-pressure chronic ulcer of right ankle with unspecified severity: Secondary | ICD-10-CM

## 2019-10-22 DIAGNOSIS — L089 Local infection of the skin and subcutaneous tissue, unspecified: Secondary | ICD-10-CM

## 2019-10-22 NOTE — Therapy (Signed)
[facility] [address] [place], [place], [address] Phone: [phone]   Fax:  [phone]  Wound Care Therapy  Patient Details  Name: Holly Sparks MRN: [id number] Date of Birth: 04/20/1941 Referring Provider (PT): Dillard EssexAlison Marshall   Encounter Date: 10/22/2019  PT End of Session - 10/22/19 1039    Visit Number  5    Number of Visits  8    Date for PT Re-Evaluation  11/05/19    Authorization Type  [employer] medicare    Authorization - Visit Number  5    Authorization - Number of Visits  8    PT Start Time  0920    PT Stop Time  0955    PT Time Calculation (min)  35 min    Activity Tolerance  Patient limited by pain    Behavior During Therapy  [facility] tasks assessed/performed       Past Medical History:  Diagnosis Date  . Diabetes (HCC)    Type 2 x 10 yrs  . Dysrhythmia    AFib  . GERD (gastroesophageal reflux disease)   . Grade I diastolic dysfunction 09/03/2018  . High cholesterol   . Hypertension    x 10 yrs  . Paroxysmal a-fib: Remote hx of afib 06/15/2015  . Stroke [facility](HCC)    no deficits    Past Surgical History:  Procedure Laterality Date  . CATARACT EXTRACTION W/PHACO Left 06/06/2016   Procedure: CATARACT EXTRACTION PHACO AND INTRAOCULAR LENS PLACEMENT (IOC);  Surgeon: Gemma PayorKerry Hunt, MD;  Location: [facility];  Service: Ophthalmology;  Laterality: Left;  CDE: 8.32  . COLONOSCOPY    . COLONOSCOPY N/A 05/08/2013   Procedure: COLONOSCOPY;  Surgeon: Malissa HippoNajeeb U Rehman, MD;  Location: [facility];  Service: Endoscopy;  Laterality: N/A;  1015  . NECK SURGERY     x 2 for spur and disc problems    There were no vitals filed for this visit.              Wound Therapy - 10/22/19 1028    Subjective  pt returns today with dressing off.  States her leg started hurting her yesterday and she removed the bandaging   order sent to the MD requesting treatment for ant. wounds    Patient and Family Stated Goals  wounds to heal      Date of Onset  09/29/19    Evaluation and Treatment Procedures Explained to Patient/Family  Yes    Evaluation and Treatment Procedures  agreed to    Wound Properties Date First Assessed: 10/08/19 Time First Assessed: 1139 Wound Type: Diabetic ulcer Location: Ankle Location Orientation: Right;Medial Wound Description (Comments): Lg blister with congealed fluid; debrided which left the following  Present on Admission: Yes   Dressing Type  Compression wrap;Impregnated gauze (bismuth)    Dressing Changed  Changed    Dressing Status  None    Dressing Change Frequency  PRN    Site / Wound Assessment  Red   anticipate this to change as blister was just removed    % Wound base Red or Granulating  100%   anticipate this to change blister just removed   Wound Length (cm)  2 cm    Wound Width (cm)  1.5 cm    Wound Depth (cm)  0 cm    Wound Volume (cm^3)  0 cm^3    Wound Surface Area (cm^2)  3 cm^2    Drainage Amount  Scant  from burst blister    Drainage Description  Serosanguineous    Treatment  Cleansed;Debridement (Selective)    Wound Properties Date First Assessed: 10/08/19 Time First Assessed: 1140 Wound Type: Other (Comment) Location: Knee Location Orientation: Right Wound Description (Comments): middle and largest of abrasions  Present on Admission: Yes   Dressing Type  Compression wrap;Impregnated gauze (bismuth)    Dressing Change Frequency  PRN    Site / Wound Assessment  Yellow    % Wound base Red or Granulating  100%    % Wound base Yellow/Fibrinous Exudate  0%    Peri-wound Assessment  Erythema (blanchable)    Wound Length (cm)  1 cm    Wound Width (cm)  1.5 cm    Wound Depth (cm)  0 cm    Wound Volume (cm^3)  0 cm^3    Wound Surface Area (cm^2)  1.5 cm^2    Drainage Amount  Scant    Drainage Description  Serosanguineous    Treatment  Cleansed    Selective Debridement - Location  medial ankle, perimeter    Selective Debridement - Tools Used  Forceps    Selective  Debridement - Tissue Removed  dead skin along periphery of wound.      Wound Therapy - Clinical Statement  Drastic improvmement of wound with only a small area left in each area.  Cleansed area well and removed ded skin from perimeter of medial knee wound.  Moisturized liberally around as extremely dry.  Covered knee wound with xerform, 2X2 and medipored.  Medial ankle dressed with xerform, kerlex and #5 netting to secure.    Wound Therapy - Functional Problem List  Pain with walking      Factors Delaying/Impairing Wound Healing  Diabetes Mellitus;Altered sensation;Infection - systemic/local;Immobility;Multiple medical problems;Polypharmacy    Hydrotherapy Plan  Debridement;Dressing change;Patient/family education    Wound Therapy - Frequency  2X / week    Wound Therapy - Current Recommendations  PT    Wound Plan  continue woundcare 2Xweek.    Dressing   xeroform, kerlix, cotton at ankle, coban and netting .               PT Education - 10/22/19 1037    Education Details  instructed to leave dressings in place and not to disturb for proper healing and reducing risk of infection.    Person(s) Educated  Patient;Spouse    Methods  Explanation    Comprehension  Verbalized understanding       PT Short Term Goals - 10/10/19 1157      PT SHORT TERM GOAL #1   Title  Pt wounds to be 100% granulated to prevent infection    Time  2    Period  Weeks    Status  On-going    Target Date  10/22/19      PT SHORT TERM GOAL #2   Title  Pt knee abrasions to be healed.    Time  2    Period  Weeks    Status  On-going        PT Long Term Goals - 10/10/19 1156      PT LONG TERM GOAL #1   Title  Wound on medial aspect of Rt ankle to be healed    Time  4    Period  Weeks    Status  On-going              Patient will benefit from skilled therapeutic intervention in order  to improve the following deficits and impairments:     Visit Diagnosis: Diabetic ulcer of right ankle  associated with diabetes mellitus due to underlying condition (HCC)  Abrasion of knee, infected, right, initial encounter  Pain in right ankle and joints of right foot     Problem List Patient Active Problem List   Diagnosis Date Noted  . 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 Grade I diastolic dysfunction 09/03/2018  . Hypomagnesemia 09/03/2018  . Paroxysmal atrial fibrillation (HCC) 08/31/2015  . Chronic anticoagulation 08/31/2015  . Type 2 diabetes mellitus with circulatory disorder (HCC) 08/31/2015  . 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 Cerebral infarction due to embolism of left middle cerebral artery (HCC)   . Stroke-like symptoms 06/13/2015  . HLD (hyperlipidemia) 06/13/2015  . DDD (degenerative disc disease), cervical 06/13/2015  . Aphasia 06/13/2015  . Expressive aphasia   . Cerebral infarction due to unspecified mechanism   . Type 2 diabetes mellitus without complication (HCC)   . IBS (irritable bowel syndrome) 04/24/2013  . Diabetes (HCC) 04/24/2013  . Essential hypertension, benign 04/24/2013   Lurena Nida, PTA/CLT [phone]  Lurena Nida 10/22/2019, 10:40 AM  [facility] [address] [place], [place], [address] Phone: [phone]   Fax:  [phone]  Name: Holly Sparks MRN: [id number] Date of Birth: [birth year]

## 2019-10-29 ENCOUNTER — Other Ambulatory Visit: Payer: Self-pay

## 2019-10-29 ENCOUNTER — Encounter (HOSPITAL_COMMUNITY): Payer: Self-pay | Admitting: Physical Therapy

## 2019-10-29 ENCOUNTER — Ambulatory Visit (HOSPITAL_COMMUNITY): Payer: Medicare Other | Attending: Nurse Practitioner | Admitting: Physical Therapy

## 2019-10-29 DIAGNOSIS — S80211A Abrasion, right knee, initial encounter: Secondary | ICD-10-CM | POA: Insufficient documentation

## 2019-10-29 DIAGNOSIS — L97319 Non-pressure chronic ulcer of right ankle with unspecified severity: Secondary | ICD-10-CM | POA: Diagnosis present

## 2019-10-29 DIAGNOSIS — L089 Local infection of the skin and subcutaneous tissue, unspecified: Secondary | ICD-10-CM | POA: Diagnosis present

## 2019-10-29 DIAGNOSIS — E08622 Diabetes mellitus due to underlying condition with other skin ulcer: Secondary | ICD-10-CM | POA: Insufficient documentation

## 2019-10-29 NOTE — Therapy (Signed)
[place], [place], [address] Phone: [phone]   Fax:  [phone]  Wound Care Therapy  Patient Details  Name: Holly Sparks MRN: [id number] Date of Birth: 06-30-1941 Referring Provider (PT): Carlis Abbott  PHYSICAL THERAPY DISCHARGE SUMMARY  Visits from Start of Care: 6  Current functional level related to goals / functional outcomes: Able to ambulate without pain    Remaining deficits: none   Education / Equipment: To keep skin moisturized for improved integrity.  Plan: Patient agrees to discharge.  Patient goals were not met. Patient is being discharged due to meeting the stated rehab goals.  ?????        Encounter Date: 10/29/2019  PT End of Session - 10/29/19 1151    Visit Number  6    Number of Visits  6    Date for PT Re-Evaluation  11/05/19    Authorization Type  [employer] medicare    Authorization - Visit Number  6    Authorization - Number of Visits  6    PT Start Time  1751    PT Stop Time  1120    PT Time Calculation (min)  35 min    Activity Tolerance  Patient limited by pain    Behavior During Therapy  WFL for tasks assessed/performed       Past Medical History:  Diagnosis Date  . Diabetes ([place])    Type 2 x 10 yrs  . Dysrhythmia    AFib  . GERD (gastroesophageal reflux disease)   . Grade I diastolic dysfunction 01/02/8526  . High cholesterol   . Hypertension    x 10 yrs  . Paroxysmal a-fib: Remote hx of afib 06/15/2015  . Stroke [facility])    no deficits    Past Surgical History:  Procedure Laterality Date  . CATARACT EXTRACTION W/PHACO Left 06/06/2016   Procedure: CATARACT EXTRACTION PHACO AND INTRAOCULAR LENS PLACEMENT (IOC);  Surgeon: Tonny Branch, MD;  Location: [facility];  Service: Ophthalmology;  Laterality: Left;  CDE: 8.32  . COLONOSCOPY    . COLONOSCOPY N/A 05/08/2013   Procedure: COLONOSCOPY;  Surgeon: Rogene Houston, MD;  Location[facility];  Service: Endoscopy;   Laterality: N/A;  1015  . NECK SURGERY     x 2 for spur and disc problems    There were no vitals filed for this visit.              Wound Therapy - 10/29/19 1141    Subjective  PT states that she is not having any pain any longer .  She hopes today will be her last day.    Patient and Family Stated Goals  wounds to heal     Date of Onset  09/29/19    Evaluation and Treatment Procedures Explained to Patient/Family  Yes    Evaluation and Treatment Procedures  agreed to    Wound Properties Date First Assessed: 10/08/19 Time First Assessed: 7824 Wound Type: Diabetic ulcer Location: Ankle Location Orientation: Right;Medial Wound Description (Comments): Lg blister with congealed fluid; debrided which left the following  Present on Admission: Yes   Dressing Type  Impregnated gauze (bismuth);Gauze (Comment)    Dressing Changed  Other (Comment)   no dressing needed after treatment.    Dressing Status  None    Dressing Change Frequency  PRN    Site / Wound Assessment  --   anticipate this to change as blister was just removed    %  Wound base Red or Granulating  --   anticipate this to change blister just removed   % Wound base Yellow/Fibrinous Exudate  --   healed    Drainage Amount  None    Treatment  Cleansed;Debridement (Selective)    Wound Properties Date First Assessed: 10/08/19 Time First Assessed: 1140 Wound Type: Other (Comment) Location: Knee Location Orientation: Right Wound Description (Comments): middle and largest of abrasions  Present on Admission: Yes   Dressing Type  None    Dressing Change Frequency  PRN    Site / Wound Assessment  --    % Wound base Red or Granulating  --    % Wound base Yellow/Fibrinous Exudate  --    Peri-wound Assessment  --    Drainage Amount  --    Drainage Description  --    Treatment  Cleansed;Debridement (Selective)    Selective Debridement - Location  medial ankle, perimeter    Selective Debridement - Tools Used  Forceps;Scissors     Selective Debridement - Tissue Removed  dead skin over new skin     Wound Therapy - Clinical Statement  Upon removal of the bandage wounds are fully healed but have a thin layer of dead skin over healed area.  After cleansing of the LE the therapist debrided the dead skin with forceps and scissors followed by moisurizing sking with lotion.  Therapist explained to pt and husband that pt has very dry skin and it might be beneficial to moisturize twic a day to assist with improvedd skin integrity.    Wound Therapy - Functional Problem List  none    Factors Delaying/Impairing Wound Healing  Diabetes Mellitus;Altered sensation;Infection - systemic/local;Immobility;Multiple medical problems;Polypharmacy    Hydrotherapy Plan  Debridement;Dressing change;Patient/family education    Wound Therapy - Frequency  2X / week    Wound Therapy - Current Recommendations  PT    Wound Plan  PT is ready for discharge she is no longer in need of skilled care.     Dressing   discharge.               PT Education - 10/29/19 1150    Education Details  the importance of moisturizing    Person(s) Educated  Patient    Methods  Explanation    Comprehension  Verbalized understanding       PT Short Term Goals - 10/29/19 1152      PT SHORT TERM GOAL #1   Title  Pt wounds to be 100% granulated to prevent infection    Time  2    Period  Weeks    Status  Achieved    Target Date  10/22/19      PT SHORT TERM GOAL #2   Title  Pt knee abrasions to be healed.    Time  2    Period  Weeks    Status  Achieved        PT Long Term Goals - 10/29/19 1152      PT LONG TERM GOAL #1   Title  Wound on medial aspect of Rt ankle to be healed    Time  4    Period  Weeks    Status  Achieved            Plan - 10/29/19 1151    Clinical Impression Statement  see above    Personal Factors and Comorbidities  Comorbidity 2;Fitness    Comorbidities  DM, CVA    Examination-Activity Limitations  Dressing;Locomotion Level    Examination-Participation Restrictions  Other    Stability/Clinical Decision Making  Evolving/Moderate complexity    Rehab Potential  Good    PT Frequency  2x / week    PT Duration  4 weeks    PT Treatment/Interventions  Other (comment)   cleanse, moisturize, debridement and dressing changes   PT Next Visit Plan  discharge.       Patient will benefit from skilled therapeutic intervention in order to improve the following deficits and impairments:  Pain, Decreased skin integrity  Visit Diagnosis: Diabetic ulcer of right ankle associated with diabetes mellitus due to underlying condition (HCC)  Abrasion of knee, infected, right, initial encounter     Problem List Patient Active Problem List   Diagnosis Date Noted  . 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 Grade I diastolic dysfunction 12/28/4386  . Hypomagnesemia 09/03/2018  . Paroxysmal atrial fibrillation ([place]) 08/31/2015  . Chronic anticoagulation 08/31/2015  . Type 2 diabetes mellitus with circulatory disorder ([place]) 08/31/2015  . Left middle cerebral artery stroke ([place]) 06/16/2015  . Cerebral infarction due to embolism of left middle cerebral artery ([place])   . Stroke-like symptoms 06/13/2015  . HLD (hyperlipidemia) 06/13/2015  . DDD (degenerative disc disease), cervical 06/13/2015  . Aphasia 06/13/2015  . Expressive aphasia   . Cerebral infarction due to unspecified mechanism   . Type 2 diabetes mellitus without complication ([place])   . IBS (irritable bowel syndrome) 04/24/2013  . Diabetes ([place]) 04/24/2013  . Essential hypertension, benign 04/24/2013   Rayetta Humphrey, PT CLT [phone] 10/29/2019, 11:52 AM  [facility] [address] [place], [place], [address] Phone: [phone]   Fax:  [phone]  Name: ZANETA LIGHTCAP MRN: [id number] Date of Birth: Sep 29, 1941

## 2019-10-31 ENCOUNTER — Ambulatory Visit (HOSPITAL_COMMUNITY): Payer: Medicare Other | Admitting: Physical Therapy

## 2019-11-05 ENCOUNTER — Ambulatory Visit (HOSPITAL_COMMUNITY): Payer: Medicare Other | Admitting: Physical Therapy

## 2019-11-07 ENCOUNTER — Emergency Department (HOSPITAL_COMMUNITY): Payer: Medicare Other

## 2019-11-07 ENCOUNTER — Observation Stay (HOSPITAL_COMMUNITY): Payer: Medicare Other

## 2019-11-07 ENCOUNTER — Observation Stay (HOSPITAL_COMMUNITY)
Admission: EM | Admit: 2019-11-07 | Discharge: 2019-11-08 | Disposition: A | Discharge status: Home / Self Care | Payer: Medicare Other | Attending: Internal Medicine | Admitting: Internal Medicine

## 2019-11-07 ENCOUNTER — Ambulatory Visit (HOSPITAL_COMMUNITY): Payer: Medicare Other | Admitting: Physical Therapy

## 2019-11-07 ENCOUNTER — Encounter (HOSPITAL_COMMUNITY): Payer: Self-pay | Admitting: *Deleted

## 2019-11-07 ENCOUNTER — Other Ambulatory Visit: Payer: Self-pay

## 2019-11-07 ENCOUNTER — Observation Stay (HOSPITAL_BASED_OUTPATIENT_CLINIC_OR_DEPARTMENT_OTHER): Payer: Medicare Other

## 2019-11-07 DIAGNOSIS — R2689 Other abnormalities of gait and mobility: Secondary | ICD-10-CM | POA: Insufficient documentation

## 2019-11-07 DIAGNOSIS — M6281 Muscle weakness (generalized): Secondary | ICD-10-CM | POA: Diagnosis not present

## 2019-11-07 DIAGNOSIS — I4891 Unspecified atrial fibrillation: Secondary | ICD-10-CM | POA: Diagnosis not present

## 2019-11-07 DIAGNOSIS — R29898 Other symptoms and signs involving the musculoskeletal system: Secondary | ICD-10-CM

## 2019-11-07 DIAGNOSIS — K219 Gastro-esophageal reflux disease without esophagitis: Secondary | ICD-10-CM | POA: Insufficient documentation

## 2019-11-07 DIAGNOSIS — Z20828 Contact with and (suspected) exposure to other viral communicable diseases: Secondary | ICD-10-CM | POA: Insufficient documentation

## 2019-11-07 DIAGNOSIS — I110 Hypertensive heart disease with heart failure: Secondary | ICD-10-CM | POA: Insufficient documentation

## 2019-11-07 DIAGNOSIS — E876 Hypokalemia: Secondary | ICD-10-CM | POA: Diagnosis not present

## 2019-11-07 DIAGNOSIS — Z79899 Other long term (current) drug therapy: Secondary | ICD-10-CM | POA: Diagnosis not present

## 2019-11-07 DIAGNOSIS — E119 Type 2 diabetes mellitus without complications: Secondary | ICD-10-CM | POA: Diagnosis not present

## 2019-11-07 DIAGNOSIS — R531 Weakness: Secondary | ICD-10-CM | POA: Diagnosis present

## 2019-11-07 DIAGNOSIS — G459 Transient cerebral ischemic attack, unspecified: Principal | ICD-10-CM

## 2019-11-07 DIAGNOSIS — I5032 Chronic diastolic (congestive) heart failure: Secondary | ICD-10-CM | POA: Diagnosis not present

## 2019-11-07 DIAGNOSIS — R2681 Unsteadiness on feet: Secondary | ICD-10-CM | POA: Insufficient documentation

## 2019-11-07 LAB — APTT: aPTT: 32 seconds (ref 24–36)

## 2019-11-07 LAB — PROTIME-INR
INR: 1.5 — ABNORMAL HIGH (ref 0.8–1.2)
Prothrombin Time: 17.5 seconds — ABNORMAL HIGH (ref 11.4–15.2)

## 2019-11-07 LAB — CBC
HCT: 38.4 % (ref 36.0–46.0)
Hemoglobin: 12.2 g/dL (ref 12.0–15.0)
MCH: 28.8 pg (ref 26.0–34.0)
MCHC: 31.8 g/dL (ref 30.0–36.0)
MCV: 90.6 fL (ref 80.0–100.0)
Platelets: 155 10*3/uL (ref 150–400)
RBC: 4.24 MIL/uL (ref 3.87–5.11)
RDW: 13.2 % (ref 11.5–15.5)
WBC: 5.6 10*3/uL (ref 4.0–10.5)
nRBC: 0 % (ref 0.0–0.2)

## 2019-11-07 LAB — DIFFERENTIAL
Abs Immature Granulocytes: 0.01 10*3/uL (ref 0.00–0.07)
Basophils Absolute: 0 10*3/uL (ref 0.0–0.1)
Basophils Relative: 1 %
Eosinophils Absolute: 0 10*3/uL (ref 0.0–0.5)
Eosinophils Relative: 1 %
Immature Granulocytes: 0 %
Lymphocytes Relative: 23 %
Lymphs Abs: 1.3 10*3/uL (ref 0.7–4.0)
Monocytes Absolute: 0.3 10*3/uL (ref 0.1–1.0)
Monocytes Relative: 5 %
Neutro Abs: 4 10*3/uL (ref 1.7–7.7)
Neutrophils Relative %: 70 %

## 2019-11-07 LAB — I-STAT CHEM 8, ED
BUN: 21 mg/dL (ref 8–23)
Calcium, Ion: 1.04 mmol/L — ABNORMAL LOW (ref 1.15–1.40)
Chloride: 92 mmol/L — ABNORMAL LOW (ref 98–111)
Creatinine, Ser: 1.2 mg/dL — ABNORMAL HIGH (ref 0.44–1.00)
Glucose, Bld: 141 mg/dL — ABNORMAL HIGH (ref 70–99)
HCT: 37 % (ref 36.0–46.0)
Hemoglobin: 12.6 g/dL (ref 12.0–15.0)
Potassium: 2.9 mmol/L — ABNORMAL LOW (ref 3.5–5.1)
Sodium: 137 mmol/L (ref 135–145)
TCO2: 30 mmol/L (ref 22–32)

## 2019-11-07 LAB — COMPREHENSIVE METABOLIC PANEL
ALT: 11 U/L (ref 0–44)
AST: 19 U/L (ref 15–41)
Albumin: 3.4 g/dL — ABNORMAL LOW (ref 3.5–5.0)
Alkaline Phosphatase: 62 U/L (ref 38–126)
Anion gap: 14 (ref 5–15)
BUN: 23 mg/dL (ref 8–23)
CO2: 29 mmol/L (ref 22–32)
Calcium: 8.3 mg/dL — ABNORMAL LOW (ref 8.9–10.3)
Chloride: 93 mmol/L — ABNORMAL LOW (ref 98–111)
Creatinine, Ser: 1.18 mg/dL — ABNORMAL HIGH (ref 0.44–1.00)
GFR calc Af Amer: 52 mL/min — ABNORMAL LOW (ref >60)
GFR calc non Af Amer: 44 mL/min — ABNORMAL LOW (ref >60)
Glucose, Bld: 147 mg/dL — ABNORMAL HIGH (ref 70–99)
Potassium: 2.8 mmol/L — ABNORMAL LOW (ref 3.5–5.1)
Sodium: 136 mmol/L (ref 135–145)
Total Bilirubin: 0.9 mg/dL (ref 0.3–1.2)
Total Protein: 6.5 g/dL (ref 6.5–8.1)

## 2019-11-07 LAB — URINALYSIS, ROUTINE W REFLEX MICROSCOPIC
Bacteria, UA: NONE SEEN
Bilirubin Urine: NEGATIVE
Glucose, UA: NEGATIVE mg/dL
Ketones, ur: NEGATIVE mg/dL
Leukocytes,Ua: NEGATIVE
Nitrite: NEGATIVE
Protein, ur: NEGATIVE mg/dL
Specific Gravity, Urine: 1.008 (ref 1.005–1.030)
pH: 7 (ref 5.0–8.0)

## 2019-11-07 LAB — RAPID URINE DRUG SCREEN, HOSP PERFORMED
Amphetamines: NOT DETECTED
Barbiturates: NOT DETECTED
Benzodiazepines: NOT DETECTED
Cocaine: NOT DETECTED
Opiates: NOT DETECTED
Tetrahydrocannabinol: NOT DETECTED

## 2019-11-07 LAB — MAGNESIUM: Magnesium: 0.7 mg/dL — CL (ref 1.7–2.4)

## 2019-11-07 LAB — VITAMIN B12: Vitamin B-12: 152 pg/mL — ABNORMAL LOW (ref 180–914)

## 2019-11-07 LAB — ECHOCARDIOGRAM COMPLETE
Height: 67 in
Weight: 1792 oz

## 2019-11-07 LAB — TSH: TSH: 3.062 u[IU]/mL (ref 0.350–4.500)

## 2019-11-07 LAB — ETHANOL: Alcohol, Ethyl (B): <10 mg/dL (ref <10)

## 2019-11-07 LAB — GLUCOSE, CAPILLARY: Glucose-Capillary: 151 mg/dL — ABNORMAL HIGH (ref 70–99)

## 2019-11-07 LAB — SARS CORONAVIRUS 2 (TAT 6-24 HRS): SARS Coronavirus 2: NEGATIVE

## 2019-11-07 MED ORDER — ACETAMINOPHEN 160 MG/5ML PO SOLN: 650.0000 mg | ORAL | Status: DC | PRN

## 2019-11-07 MED ORDER — METOPROLOL TARTRATE 5 MG/5ML IV SOLN
2.5000 mg | Freq: Once | INTRAVENOUS | Status: AC
  Administered 2019-11-07: 2.5 mg via INTRAVENOUS
  Filled 2019-11-07: qty 5

## 2019-11-07 MED ORDER — SODIUM CHLORIDE 0.9 % IV SOLN
Freq: Once | INTRAVENOUS | Status: AC
  Administered 2019-11-07: 14:00:00 via INTRAVENOUS

## 2019-11-07 MED ORDER — MAGNESIUM SULFATE 2 GM/50ML IV SOLN
2.0000 g | Freq: Once | INTRAVENOUS | Status: AC
  Administered 2019-11-07: 2 g via INTRAVENOUS
  Filled 2019-11-07: qty 50

## 2019-11-07 MED ORDER — SENNOSIDES-DOCUSATE SODIUM 8.6-50 MG PO TABS
1.0000 | ORAL_TABLET | Freq: Every evening | ORAL | Status: DC | PRN
  Filled 2019-11-07: qty 1

## 2019-11-07 MED ORDER — ACETAMINOPHEN 325 MG PO TABS: 650.0000 mg | ORAL_TABLET | ORAL | Status: DC | PRN

## 2019-11-07 MED ORDER — METOPROLOL TARTRATE 25 MG PO TABS
12.5000 mg | ORAL_TABLET | Freq: Two times a day (BID) | ORAL | Status: DC
  Administered 2019-11-07 – 2019-11-08 (×2): 12.5 mg via ORAL
  Filled 2019-11-07 (×2): qty 1

## 2019-11-07 MED ORDER — POTASSIUM CHLORIDE CRYS ER 20 MEQ PO TBCR: 40.0000 meq | EXTENDED_RELEASE_TABLET | Freq: Once | ORAL | Status: DC

## 2019-11-07 MED ORDER — DILTIAZEM HCL ER COATED BEADS 120 MG PO CP24
120.0000 mg | ORAL_CAPSULE | Freq: Every day | ORAL | Status: DC
  Administered 2019-11-07 – 2019-11-08 (×2): 120 mg via ORAL
  Filled 2019-11-07 (×2): qty 1

## 2019-11-07 MED ORDER — APIXABAN 5 MG PO TABS
5.0000 mg | ORAL_TABLET | Freq: Two times a day (BID) | ORAL | Status: DC
  Administered 2019-11-07 – 2019-11-08 (×2): 5 mg via ORAL
  Filled 2019-11-07 (×2): qty 1

## 2019-11-07 MED ORDER — ENSURE ENLIVE PO LIQD
237.0000 mL | Freq: Two times a day (BID) | ORAL | Status: DC
  Administered 2019-11-08: 237 mL via ORAL

## 2019-11-07 MED ORDER — SODIUM CHLORIDE 0.9 % IV SOLN
INTRAVENOUS | Status: AC
  Administered 2019-11-08: 09:00:00 via INTRAVENOUS

## 2019-11-07 MED ORDER — STROKE: EARLY STAGES OF RECOVERY BOOK
Freq: Once | Status: AC
  Administered 2019-11-07: 19:00:00
  Filled 2019-11-07: qty 1

## 2019-11-07 MED ORDER — INSULIN ASPART 100 UNIT/ML ~~LOC~~ SOLN
0.0000 [IU] | Freq: Three times a day (TID) | SUBCUTANEOUS | Status: DC
  Administered 2019-11-07: 1 [IU] via SUBCUTANEOUS
  Administered 2019-11-08: 3 [IU] via SUBCUTANEOUS

## 2019-11-07 MED ORDER — ACETAMINOPHEN 650 MG RE SUPP: 650.0000 mg | RECTAL | Status: DC | PRN

## 2019-11-07 MED ORDER — PANTOPRAZOLE SODIUM 40 MG PO TBEC
40.0000 mg | DELAYED_RELEASE_TABLET | Freq: Every day | ORAL | Status: DC
  Administered 2019-11-08: 40 mg via ORAL
  Filled 2019-11-07: qty 1

## 2019-11-07 MED ORDER — INSULIN DETEMIR 100 UNIT/ML ~~LOC~~ SOLN
5.0000 [IU] | Freq: Every day | SUBCUTANEOUS | Status: DC
  Administered 2019-11-07: 5 [IU] via SUBCUTANEOUS
  Filled 2019-11-07 (×4): qty 0.05

## 2019-11-07 MED ORDER — ATORVASTATIN CALCIUM 10 MG PO TABS
20.0000 mg | ORAL_TABLET | Freq: Every day | ORAL | Status: DC
  Administered 2019-11-07: 20 mg via ORAL
  Filled 2019-11-07: qty 2

## 2019-11-07 MED ORDER — POTASSIUM CHLORIDE CRYS ER 20 MEQ PO TBCR
40.0000 meq | EXTENDED_RELEASE_TABLET | Freq: Once | ORAL | Status: AC
  Administered 2019-11-07: 40 meq via ORAL
  Filled 2019-11-07: qty 2

## 2019-11-07 MED ORDER — POTASSIUM CHLORIDE 10 MEQ/100ML IV SOLN
10.0000 meq | Freq: Once | INTRAVENOUS | Status: AC
  Administered 2019-11-07: 10 meq via INTRAVENOUS
  Filled 2019-11-07: qty 100

## 2019-11-07 NOTE — ED Notes (Signed)
Date and time results received: 11/07/19 1135   Test: potassium Critical Value: 2.9  Name of Provider Notified: Dr. Reather Converse  Orders Received? Or Actions Taken?: see orders

## 2019-11-07 NOTE — H&P (Signed)
History and Physical    Holly Sparks ZOX:[id number] DOB: December 15, 1940 DOA: 11/07/2019  Referring MD/NP/PA: Dr. Jodi Mourning. PCP: Gareth Morgan, MD  Patient coming from: Home  Chief Complaint: Right-sided weakness  HPI: Holly Sparks is a 78 y.o. female with past medical history significant for hypertension, diabetes type 2, atrial fibrillation (chronically on Eliquis), chronic diastolic heart failure, hyperlipidemia, and prior history of stroke without residual deficit; who presented to the hospital after experiencing right-sided weakness affecting mainly her upper extremity.  Symptoms lasted for about 30 minutes and she presents a complete resolution of then while being assessed in the emergency department.  Patient denies any fever, chills, cough, shortness of breath, nausea, vomiting, hematuria, dysuria, headaches, blurred vision or any other complaints. While in the ED on presentation she was transiently experiencing A. fib with RVR which was successfully controlled with the use of IV metoprolol.  CT head was negative for acute intracranial abnormalities; blood work demonstrated severe hypomagnesemia and hypokalemia.  Given patient's symptoms abnormalities [facility] was contacted to admit patient for further evaluation and management of electrolyte abnormality and TIA work-up.  Neurology was consulted.  Gentle IV fluids resuscitation and electrolyte repletion initiated while in the ER.  Past Medical/Surgical History: Past Medical History:  Diagnosis Date  . Diabetes (HCC)    Type 2 x 10 yrs  . Dysrhythmia    AFib  . GERD (gastroesophageal reflux disease)   . Grade I diastolic dysfunction 09/03/2018  . High cholesterol   . Hypertension    x 10 yrs  . Paroxysmal a-fib: Remote hx of afib 06/15/2015  . Stroke [facility])    no deficits    Past Surgical History:  Procedure Laterality Date  . CATARACT EXTRACTION W/PHACO Left 06/06/2016   Procedure: CATARACT EXTRACTION PHACO AND INTRAOCULAR  LENS PLACEMENT (IOC);  Surgeon: Gemma Payor, MD;  Location: [facility] ORS;  Service: Ophthalmology;  Laterality: Left;  CDE: 8.32  . COLONOSCOPY    . COLONOSCOPY N/A 05/08/2013   Procedure: COLONOSCOPY;  Surgeon: Malissa Hippo, MD;  Location: [facility] SUITE;  Service: Endoscopy;  Laterality: N/A;  1015  . NECK SURGERY     x 2 for spur and disc problems    Social History:  reports that she has never smoked. She has never used smokeless tobacco. She reports that she does not drink alcohol or use drugs.  Allergies: No Known Allergies  Family History:  Family History  Problem Relation Age of Onset  . Diabetes Father     Prior to Admission medications   Medication Sig Start Date End Date Taking? Authorizing Provider  acetaminophen (TYLENOL) 325 MG tablet Take 650 mg by mouth every 6 (six) hours as needed.    [provider]  Amino Acids-Protein Hydrolys (FEEDING SUPPLEMENT, PRO-STAT SUGAR FREE 64,) LIQD Take 30 mLs by mouth 2 (two) times daily between meals.    [provider]  apixaban (ELIQUIS) 5 MG TABS tablet Take 1 tablet (5 mg total) by mouth 2 (two) times daily. 06/26/15   Angiulli, Mcarthur Rossetti, PA-C  atorvastatin (LIPITOR) 20 MG tablet Take 1 tablet (20 mg total) by mouth at bedtime. 06/26/15   Angiulli, Mcarthur Rossetti, PA-C  diltiazem (CARDIZEM CD) 120 MG 24 hr capsule Take 1 capsule (120 mg total) by mouth daily. 01/21/18   Bethann Berkshire, MD  lisinopril-hydrochlorothiazide (PRINZIDE,ZESTORETIC) 20-12.5 MG tablet Take 1 tablet by mouth daily. 07/02/18   [provider]  metFORMIN (GLUCOPHAGE) 1000 MG tablet Take 500 mg by mouth 2 (  two) times daily with a meal.    [provider]  metoprolol tartrate (LOPRESSOR) 25 MG tablet Take 12.5 mg by mouth 2 (two) times daily.    [provider]  ondansetron (ZOFRAN) 4 MG tablet Take 4 mg by mouth every 6 (six) hours as needed for nausea or vomiting.    [provider]  pantoprazole (PROTONIX) 40 MG tablet  Take 40 mg by mouth daily.    [provider]    Review of Systems:  Negative except as otherwise mentioned in HPI.   Physical Exam: Vitals:   11/07/19 1430 11/07/19 1500 11/07/19 1515 11/07/19 1530  BP: (!) 148/103 (!) 137/108  (!) 134/93  Pulse: 91 82 97 (!) 112  Resp: 19 17 17 17   Temp:      TempSrc:      SpO2: 99% 92%  99%  Weight:      Height:        Constitutional: NAD, calm, comfortable; denies chest pains or palpitations currently.  Patient is afebrile. Eyes: PERRL, lids and conjunctivae normal; no icterus, no nystagmus. ENMT: Mucous membranes are moist. Posterior pharynx clear of any exudate or lesions.  Neck: normal, supple, no masses, no thyromegaly, no JVD Respiratory: clear to auscultation bilaterally, no wheezing, no crackles. Normal respiratory effort. No accessory muscle use.  Cardiovascular: Irregular rhythm; rate control, no rubs, no gallops.No extremity edema. 2+ pedal pulses. No carotid bruits.  Abdomen: no tenderness, no masses palpated. No hepatosplenomegaly. Bowel sounds positive.  Musculoskeletal: no clubbing / cyanosis. No joint deformity upper and lower extremities. Good ROM, no contractures. Normal muscle tone.  Skin: no rashes, lesions, ulcers. No induration Neurologic: CN 2-12 grossly intact. Sensation intact, DTR normal. Strength 5/5 in all 4.  Psychiatric: Normal judgment and insight. Alert and oriented x 3. Normal mood.    Labs on Admission: I have personally reviewed the following labs and imaging studies  CBC: Recent Labs  Lab 11/07/19 1113 11/07/19 1128  WBC 5.6  --   NEUTROABS 4.0  --   HGB 12.2 12.6  HCT 38.4 37.0  MCV 90.6  --   PLT 155  --    Basic Metabolic Panel: Recent Labs  Lab 11/07/19 1113 11/07/19 1128  NA 136 137  K 2.8* 2.9*  CL 93* 92*  CO2 29  --   GLUCOSE 147* 141*  BUN 23 21  CREATININE 1.18* 1.20*  CALCIUM 8.3*  --   MG 0.7*  --    GFR: Estimated Creatinine Clearance: 31.5 mL/min (A) (by C-G  formula based on SCr of 1.2 mg/dL (H)).   Liver Function Tests: Recent Labs  Lab 11/07/19 1113  AST 19  ALT 11  ALKPHOS 62  BILITOT 0.9  PROT 6.5  ALBUMIN 3.4*   Coagulation Profile: Recent Labs  Lab 11/07/19 1113  INR 1.5*   Urine analysis:    Component Value Date/Time   COLORURINE YELLOW 06/13/2015 1141   APPEARANCEUR CLEAR 06/13/2015 1141   LABSPEC 1.023 06/13/2015 1141   PHURINE 5.5 06/13/2015 1141   GLUCOSEU >1000 (A) 06/13/2015 1141   HGBUR NEGATIVE 06/13/2015 1141   BILIRUBINUR NEGATIVE 06/13/2015 1141   KETONESUR 15 (A) 06/13/2015 1141   PROTEINUR NEGATIVE 06/13/2015 1141   UROBILINOGEN 0.2 06/13/2015 1141   NITRITE NEGATIVE 06/13/2015 1141   LEUKOCYTESUR NEGATIVE 06/13/2015 1141    Radiological Exams on Admission: CT HEAD WO CONTRAST  Result Date: 11/07/2019 CLINICAL DATA:  TIA EXAM: CT HEAD WITHOUT CONTRAST TECHNIQUE: Contiguous axial images were  obtained from the base of the skull through the vertex without intravenous contrast. COMPARISON:  2016 FINDINGS: Brain: There is no acute intracranial hemorrhage, mass-effect, or edema. There is no acute appearing loss of gray-white differentiation. Chronic infarcts are present involving the left basal ganglia and temporal lobe. Additional patchy hypoattenuation in the supratentorial white matter is nonspecific but probably reflects mild chronic microvascular ischemic changes. There is no extra-axial fluid collection. Prominence of the ventricles and sulci reflects mild parenchymal volume loss. Vascular: There is atherosclerotic calcification at the skull base. Skull: Calvarium is unremarkable. Sinuses/Orbits: Partial opacification of the left sphenoid sinus with evidence of chronic inflammation. Other: None. IMPRESSION: No acute intracranial hemorrhage, mass effect, or evidence of acute infarction. Chronic/nonemergent findings detailed above. Electronically Signed   By: Guadlupe Spanish M.D.   On: 11/07/2019 12:16   DG  Chest Portable 1 View  Result Date: 11/07/2019 CLINICAL DATA:  Weakness.  Recurrent falls. EXAM: PORTABLE CHEST 1 VIEW COMPARISON:  PA and lateral chest 09/03/2018. FINDINGS: Lungs clear. Heart size normal. Aortic atherosclerosis. No pneumothorax or pleural fluid. No acute or focal bony abnormality. IMPRESSION: No acute disease. Atherosclerosis. Electronically Signed   By: Drusilla Kanner M.D.   On: 11/07/2019 13:48   ECHOCARDIOGRAM COMPLETE  Result Date: 11/07/2019   ECHOCARDIOGRAM REPORT   Patient Name:   Holly Sparks Date of Exam: 11/07/2019 Medical Rec #:  [id number]         Height:       67.0 in Accession #:    [id number]        Weight:       112.0 lb Date of Birth:  1941-04-11         BSA:          1.58 m Patient Age:    78 years          BP:           148/103 mmHg Patient Gender: F                 HR:           91 bpm. [name] Procedure: 2D Echo Indications:    TIA 435.9 / G45.9  History:        Patient has prior history of Echocardiogram examinations, most                 recent 06/14/2015. TIA and Stroke, Arrythmias:Atrial                 Fibrillation; Risk Factors:Diabetes, Hypertension, Non-Smoker                 and Dyslipidemia. Chronic anticoagulation.  Sonographer:    Jeryl Columbia RDCS (AE) Referring Phys: [id number] Dela Sweeny IMPRESSIONS  1. Left ventricular ejection fraction, by visual estimation, is 60 to 65%. The left ventricle has normal function. There is mildly increased left ventricular hypertrophy.  2. Left ventricular diastolic parameters are indeterminate.  3. The left ventricle has no regional wall motion abnormalities.  4. Global right ventricle has normal systolic function.The right ventricular size is normal. No increase in right ventricular wall thickness.  5. Left atrial size was normal.  6. Right atrial size was normal.  7. Mild mitral annular calcification.  8. The mitral valve is grossly normal. Trivial mitral valve regurgitation.  9. The tricuspid  valve is grossly normal. Tricuspid valve regurgitation is trivial. 10. The aortic valve is tricuspid. Aortic valve regurgitation is not visualized.  11. The pulmonic valve was not well visualized. Pulmonic valve regurgitation is not visualized. 12. The inferior vena cava is normal in size with greater than 50% respiratory variability, suggesting right atrial pressure of 3 mmHg. FINDINGS  Left Ventricle: Left ventricular ejection fraction, by visual estimation, is 60 to 65%. The left ventricle has normal function. The left ventricle has no regional wall motion abnormalities. The left ventricular internal cavity size was the left ventricle is normal in size. There is mildly increased left ventricular hypertrophy. Concentric left ventricular hypertrophy. Left ventricular diastolic parameters are indeterminate. Right Ventricle: The right ventricular size is normal. No increase in right ventricular wall thickness. Global RV systolic function is has normal systolic function. Left Atrium: Left atrial size was normal in size. Right Atrium: Right atrial size was normal in size Pericardium: There is no evidence of pericardial effusion. Mitral Valve: The mitral valve is grossly normal. Mild mitral annular calcification. Trivial mitral valve regurgitation. Tricuspid Valve: The tricuspid valve is grossly normal. Tricuspid valve regurgitation is trivial. Aortic Valve: The aortic valve is tricuspid. Aortic valve regurgitation is not visualized. Pulmonic Valve: The pulmonic valve was not well visualized. Pulmonic valve regurgitation is not visualized. Pulmonic regurgitation is not visualized. Aorta: The aortic root is normal in size and structure. Venous: The inferior vena cava is normal in size with greater than 50% respiratory variability, suggesting right atrial pressure of 3 mmHg. IAS/Shunts: No atrial level shunt detected by color flow Doppler.  LEFT VENTRICLE PLAX 2D LVIDd:         3.13 cm  Diastology LVIDs:         2.31 cm   LV e' lateral:   8.16 cm/s LV PW:         1.47 cm  LV E/e' lateral: 12.0 LV IVS:        1.13 cm  LV e' medial:    8.05 cm/s LVOT diam:     1.80 cm  LV E/e' medial:  12.1 LV SV:         20 ml LV SV Index:   13.33 LVOT Area:     2.54 cm  RIGHT VENTRICLE RV S prime:     8.27 cm/s TAPSE (M-mode): 1.9 cm LEFT ATRIUM             Index       RIGHT ATRIUM           Index LA diam:        4.20 cm 2.66 cm/m  RA Area:     11.60 cm LA Vol (A2C):   55.8 ml 35.30 ml/m RA Volume:   23.70 ml  14.99 ml/m LA Vol (A4C):   46.7 ml 29.55 ml/m LA Biplane Vol: 52.0 ml 32.90 ml/m   AORTA Ao Root diam: 2.90 cm MITRAL VALVE MV Area (PHT): 4.04 cm            SHUNTS MV PHT:        54.52 msec          Systemic Diam: 1.80 cm MV Decel Time: 188 msec MV E velocity: 97.53 cm/s 103 cm/s  Kate Sable MD Electronically signed by Kate Sable MD Signature Date/Time: 11/07/2019/3:19:37 PM    Final     EKG: Independently reviewed.  Atrial fibrillation, no acute ischemic changes.  Normal QT.  Assessment/Plan 1-right-sided weakness: Transient and resolve while in the ED. -Patient with risk factors for stroke, including atrial fibrillation, hypertension, hyperlipidemia and diabetes. -With patient symptom resolution most likely  we are in presence of TIA -Patient chronically on Eliquis. -Patient will be admitted to the hospital for TIA work-up -MRI/MRA, 2D echo, carotid Dopplers and neurology consultation -For now we will continue Eliquis -CT head negative for acute intracranial abnormalities. -Continue statins; will be permissive with hypertension -Checking B12, TSH, A1c and lipid panel.  2-hypokalemia/hypomagnesemia -Potassium 2.9 on presentation -Magnesium 0.7 -Will provide repletion and follow electrolytes trend -Monitoring on telemetry.  3-atrial fibrillation: Patient reports being compliant with medications; but on presentation demonstrated transient A. fib with RVR. -Resume the use of Cardizem and  metoprolol -Continue Eliquis for secondary prevention -2D echo has been ordered -Replete electrolytes accordingly -Check 2D echo. -Monitoring on telemetry  4-type 2 diabetes mellitus -Will check hemoglobin A1c -Hold oral hypoglycemic agents while inpatient -Started on sliding scale insulin and Levemir.  5-hyperlipidemia -Checking lipid panel -Continue statins.  6-essential hypertension -Stable overall. -Will allow permissive hypertension while ruling out acute ischemic process. -Follow vital signs. -Cardizem and metoprolol will be continue with intention of controlling heart rate. -Holding lisinopril and HCTZ.  7-gastroesophageal flux disease -Continue PPI  8-chronic diastolic heart failure -Follow daily weights and strict intake and output -Holding diuretics initially -Appears to be euvolemic and is stable currently. -Low-sodium diet has been ordered.  DVT prophylaxis: On Eliquis Code Status: Full code Family Communication: Husband at bedside. Disposition Plan: Anticipate discharge back home once work-up completed. Consults called: neurology   Admission status: telemetry, LOS < 2 midnights, observation status.    Time Spent: 65 minutes  Vassie Lollarlos Kosisochukwu Burningham MD [facility] Pager [phone]  11/07/2019, 3:43 PM

## 2019-11-07 NOTE — ED Provider Notes (Signed)
[facility] Provider Note   CSN: [id number] Arrival date & time: 11/07/19  1029     History Chief Complaint  Patient presents with  . Weakness    Holly Sparks is a 78 y.o. female.  Patient with history of diabetes, atrial fibrillation on anticoagulant compliant presents for assessment of right arm weakness lasting proximately 30 minutes at 9:00 this morning.  No other neurologic conditions or symptoms this morning.  Patient currently at baseline.  Patient denies fevers chills or respiratory symptoms.  Patient initially denied having history of atrial fibrillation.  Patient's been compliant with medications.        Past Medical History:  Diagnosis Date  . Diabetes (HCC)    Type 2 x 10 yrs  . Dysrhythmia    AFib  . GERD (gastroesophageal reflux disease)   . Grade I diastolic dysfunction 09/03/2018  . High cholesterol   . Hypertension    x 10 yrs  . Paroxysmal a-fib: Remote hx of afib 06/15/2015  . Stroke [facility](HCC)    no deficits    Patient Active Problem List   Diagnosis Date Noted  . 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 Grade I diastolic dysfunction 09/03/2018  . Hypomagnesemia 09/03/2018  . Paroxysmal atrial fibrillation (HCC) 08/31/2015  . Chronic anticoagulation 08/31/2015  . Type 2 diabetes mellitus with circulatory disorder (HCC) 08/31/2015  . 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 Cerebral infarction due to embolism of left middle cerebral artery (HCC)   . Stroke-like symptoms 06/13/2015  . HLD (hyperlipidemia) 06/13/2015  . DDD (degenerative disc disease), cervical 06/13/2015  . Aphasia 06/13/2015  . Expressive aphasia   . Cerebral infarction due to unspecified mechanism   . Type 2 diabetes mellitus without complication (HCC)   . IBS (irritable bowel syndrome) 04/24/2013  . Diabetes (HCC) 04/24/2013  . Essential hypertension, benign 04/24/2013    Past Surgical History:   Procedure Laterality Date  . CATARACT EXTRACTION W/PHACO Left 06/06/2016   Procedure: CATARACT EXTRACTION PHACO AND INTRAOCULAR LENS PLACEMENT (IOC);  Surgeon: Gemma PayorKerry Hunt, MD;  Location: [facility] ORS;  Service: Ophthalmology;  Laterality: Left;  CDE: 8.32  . COLONOSCOPY    . COLONOSCOPY N/A 05/08/2013   Procedure: COLONOSCOPY;  Surgeon: Malissa HippoNajeeb U Rehman, MD;  Location: [facility] ENDO SUITE;  Service: Endoscopy;  Laterality: N/A;  1015  . NECK SURGERY     x 2 for spur and disc problems     OB History   No obstetric history on file.     Family History  Problem Relation Age of Onset  . Diabetes Father     Social History   Tobacco Use  . Smoking status: Never Smoker  . Smokeless tobacco: Never Used  Substance Use Topics  . Alcohol use: No    Alcohol/week: 0.0 standard drinks  . Drug use: No    Home Medications Prior to Admission medications   Medication Sig Start Date End Date Taking? Authorizing Provider  acetaminophen (TYLENOL) 325 MG tablet Take 650 mg by mouth every 6 (six) hours as needed.    [provider]  Amino Acids-Protein Hydrolys (FEEDING SUPPLEMENT, PRO-STAT SUGAR FREE 64,) LIQD Take 30 mLs by mouth 2 (two) times daily between meals.    [provider]  apixaban (ELIQUIS) 5 MG TABS tablet Take 1 tablet (5 mg total) by mouth 2 (two) times daily. 06/26/15   Angiulli, Mcarthur Rossettianiel J, PA-C  atorvastatin (LIPITOR) 20 MG  tablet Take 1 tablet (20 mg total) by mouth at bedtime. 06/26/15   Angiulli, Mcarthur Rossetti, PA-C  diltiazem (CARDIZEM CD) 120 MG 24 hr capsule Take 1 capsule (120 mg total) by mouth daily. 01/21/18   Bethann Berkshire, MD  lisinopril-hydrochlorothiazide (PRINZIDE,ZESTORETIC) 20-12.5 MG tablet Take 1 tablet by mouth daily. 07/02/18   [provider]  metFORMIN (GLUCOPHAGE) 1000 MG tablet Take 500 mg by mouth 2 (two) times daily with a meal.    [provider]  metoprolol tartrate (LOPRESSOR) 25 MG tablet Take 12.5 mg by mouth 2 (two) times daily.     [provider]  ondansetron (ZOFRAN) 4 MG tablet Take 4 mg by mouth every 6 (six) hours as needed for nausea or vomiting.    [provider]  pantoprazole (PROTONIX) 40 MG tablet Take 40 mg by mouth daily.    [provider]    Allergies    Patient has no known allergies.  Review of Systems   Review of Systems  Constitutional: Positive for fatigue. Negative for chills and fever.  HENT: Negative for congestion.   Eyes: Negative for visual disturbance.  Respiratory: Negative for shortness of breath.   Cardiovascular: Negative for chest pain.  Gastrointestinal: Negative for abdominal pain and vomiting.  Genitourinary: Negative for dysuria and flank pain.  Musculoskeletal: Negative for back pain, neck pain and neck stiffness.  Skin: Negative for rash.  Neurological: Positive for weakness. Negative for light-headedness and headaches.    Physical Exam Updated Vital Signs BP (!) 143/105   Pulse (!) 119   Temp 97.6 F (36.4 C) (Temporal)   Resp 16   Ht 5\' 7"  (1.702 m)   Wt 50.8 kg   SpO2 98%   BMI 17.54 kg/m   Physical Exam Vitals and nursing note reviewed.  Constitutional:      Appearance: She is well-developed.  HENT:     Head: Normocephalic and atraumatic.  Eyes:     General:        Right eye: No discharge.        Left eye: No discharge.     Conjunctiva/sclera: Conjunctivae normal.  Neck:     Trachea: No tracheal deviation.  Cardiovascular:     Rate and Rhythm: Tachycardia present. Rhythm irregular.  Pulmonary:     Effort: Pulmonary effort is normal.     Breath sounds: Normal breath sounds.  Abdominal:     General: There is no distension.     Palpations: Abdomen is soft.     Tenderness: There is no abdominal tenderness. There is no guarding.  Musculoskeletal:     Cervical back: Normal range of motion and neck supple.  Skin:    General: Skin is warm.     Capillary Refill: Capillary refill takes less than 2 seconds.     Findings: No  rash.  Neurological:     General: No focal deficit present.     Mental Status: She is alert and oriented to person, place, and time.     Comments: 5+ strength in UE and LE with f/e at major joints. Sensation to palpation intact in UE and LE. CNs 2-12 grossly intact.  EOMFI.  PERRL.   Finger nose and coordination intact bilateral.   Visual fields intact to finger testing. No nystagmus      ED Results / Procedures / Treatments   Labs (all labs ordered are listed, but only abnormal results are displayed) Labs Reviewed  PROTIME-INR - Abnormal; Notable for the following components:  Result Value   Prothrombin Time 17.5 (*)    INR 1.5 (*)    All other components within normal limits  COMPREHENSIVE METABOLIC PANEL - Abnormal; Notable for the following components:   Potassium 2.8 (*)    Chloride 93 (*)    Glucose, Bld 147 (*)    Creatinine, Ser 1.18 (*)    Calcium 8.3 (*)    Albumin 3.4 (*)    GFR calc non Af Amer 44 (*)    GFR calc Af Amer 52 (*)    All other components within normal limits  MAGNESIUM - Abnormal; Notable for the following components:   Magnesium 0.7 (*)    All other components within normal limits  I-STAT CHEM 8, ED - Abnormal; Notable for the following components:   Potassium 2.9 (*)    Chloride 92 (*)    Creatinine, Ser 1.20 (*)    Glucose, Bld 141 (*)    Calcium, Ion 1.04 (*)    All other components within normal limits  SARS CORONAVIRUS 2 (TAT 6-24 HRS)  ETHANOL  APTT  CBC  DIFFERENTIAL  RAPID URINE DRUG SCREEN, HOSP PERFORMED  URINALYSIS, ROUTINE W REFLEX MICROSCOPIC    EKG EKG Interpretation  Date/Time:  Thursday November 07 2019 10:49:04 EST Ventricular Rate:  124 PR Interval:    QRS Duration: 82 QT Interval:  322 QTC Calculation: 462 R Axis:   -60 Text Interpretation: Atrial fibrillation with rapid ventricular response Left anterior fascicular block Septal infarct , age undetermined Abnormal ECG Confirmed by Elnora Morrison [phone]) on  11/07/2019 11:08:54 AM   Radiology CT HEAD WO CONTRAST  Result Date: 11/07/2019 CLINICAL DATA:  TIA EXAM: CT HEAD WITHOUT CONTRAST TECHNIQUE: Contiguous axial images were obtained from the base of the skull through the vertex without intravenous contrast. COMPARISON:  2016 FINDINGS: Brain: There is no acute intracranial hemorrhage, mass-effect, or edema. There is no acute appearing loss of gray-white differentiation. Chronic infarcts are present involving the left basal ganglia and temporal lobe. Additional patchy hypoattenuation in the supratentorial white matter is nonspecific but probably reflects mild chronic microvascular ischemic changes. There is no extra-axial fluid collection. Prominence of the ventricles and sulci reflects mild parenchymal volume loss. Vascular: There is atherosclerotic calcification at the skull base. Skull: Calvarium is unremarkable. Sinuses/Orbits: Partial opacification of the left sphenoid sinus with evidence of chronic inflammation. Other: None. IMPRESSION: No acute intracranial hemorrhage, mass effect, or evidence of acute infarction. Chronic/nonemergent findings detailed above. Electronically Signed   By: Macy Mis M.D.   On: 11/07/2019 12:16    Procedures Procedures (including critical care time)  Medications Ordered in ED Medications  potassium chloride SA (KLOR-CON) CR tablet 40 mEq (has no administration in time range)  magnesium sulfate IVPB 2 g 50 mL (has no administration in time range)  potassium chloride 10 mEq in 100 mL IVPB (has no administration in time range)  0.9 %  sodium chloride infusion (has no administration in time range)  metoprolol tartrate (LOPRESSOR) injection 2.5 mg (has no administration in time range)    ED Course  I have reviewed the triage vital signs and the nursing notes.  Pertinent labs & imaging results that were available during my care of the patient were reviewed by me and considered in my medical decision making (see  chart for details).    MDM Rules/Calculators/A&P     CHA2DS2/VAS Stroke Risk Points  Current as of 7 days ago     7 >= 2 Points: High Risk  1 - 1.99 Points: Medium Risk  0 Points: Low Risk    This is the only CHA2DS2/VAS Stroke Risk Points available for the past  year.: Last Change: N/A     Details    This score determines the patient's risk of having a stroke if the  patient has atrial fibrillation.       Points Metrics  0 Has Congestive Heart Failure:  No    Current as of 7 days ago  0 Has Vascular Disease:  No    Current as of 7 days ago  1 Has Hypertension:  Yes    Current as of 7 days ago  2 Age:  62    Current as of 7 days ago  1 Has Diabetes:  Yes    Current as of 7 days ago  2 Had Stroke:  Yes  Had TIA:  No  Had thromboembolism:  No    Current as of 7 days ago  1 Female:  Yes    Current as of 7 days ago                         Patient presents with clinical concern for occult stroke/TIA given right arm weakness, history of atrial fibrillation.  Patient currently has no neuro deficits.  Patient's blood work reviewed low potassium, magnesium added on and also low.  IV magnesium and IV potassium ordered.  Patient will require admission for treatment and further work-up of stroke and electrolyte abnormalities. Creatinine 1.1.  Manual blood work unremarkable including ethanol level.  CT scan of the head no acute findings.  MRI ordered neuro consulted, hospitalist paged for admission/observation.  The patients results and plan were reviewed and discussed.   Any x-rays performed were independently reviewed by myself.   Differential diagnosis were considered with the presenting HPI.  Medications  potassium chloride SA (KLOR-CON) CR tablet 40 mEq (has no administration in time range)  magnesium sulfate IVPB 2 g 50 mL (has no administration in time range)  potassium chloride 10 mEq in 100 mL IVPB (has no administration in time range)  0.9 %  sodium chloride infusion  (has no administration in time range)  metoprolol tartrate (LOPRESSOR) injection 2.5 mg (has no administration in time range)    Vitals:   11/07/19 1041 11/07/19 1130 11/07/19 1200 11/07/19 1243  BP: (!) 133/96 (!) 133/105 (!) 138/106 (!) 143/105  Pulse: (!) 120 (!) 104 (!) 123 (!) 119  Resp: Temp: 97.6 F (36.4 C)     TempSrc: Temporal     SpO2: (!) 88% 100% 100% 98%  Weight: 50.8 kg     Height:  (1.702 m)       Final diagnoses:  TIA (transient ischemic attack)  Hypokalemia  Right arm weakness  Hypomagnesemia  Atrial fibrillation with RVR (HCC)    Admission/ observation were discussed with the admitting physician, patient and/or family and they are comfortable with the plan.    Final Clinical Impression(s) / ED Diagnoses Final diagnoses:  TIA (transient ischemic attack)  Hypokalemia  Right arm weakness  Hypomagnesemia  Atrial fibrillation with RVR [facility])    Rx / DC Orders ED Discharge Orders    None       Blane Ohara, MD 11/10/19 [phone]

## 2019-11-07 NOTE — ED Notes (Signed)
Return phone call made to Jolene Provost. No answer.

## 2019-11-07 NOTE — ED Notes (Signed)
CRITICAL VALUE ALERT  Critical Value:  Magnesium 0.7  Date & Time Notied:  11/07/2019  Provider Notified: Dr. Reather Converse   Orders Received/Actions taken: None yet

## 2019-11-07 NOTE — Progress Notes (Signed)
*  PRELIMINARY RESULTS* Echocardiogram 2D Echocardiogram has been performed.  Holly Sparks 11/07/2019, 3:14 PM

## 2019-11-07 NOTE — ED Triage Notes (Signed)
Patient presents to the ED accompanied by her spouse who provides most of the information.  Spouse states patient has had a fall about a week ago trying to get in her car.  Patient has visible bruising to right leg, right foot.  Spouse also states she did not have any movement to her right arm at 0900 for about 30 minutes.

## 2019-11-07 NOTE — ED Notes (Signed)
Permission given by pt and spouse to discuss pt's care with her son, Olivia Mackie.

## 2019-11-07 NOTE — ED Notes (Signed)
Assisted pt to restroom. Pt able to ambulate with RN with moderate assistance. PT uses walker at home. Noted to have weak, shuffling gait.

## 2019-11-08 DIAGNOSIS — R29898 Other symptoms and signs involving the musculoskeletal system: Secondary | ICD-10-CM

## 2019-11-08 DIAGNOSIS — I4891 Unspecified atrial fibrillation: Secondary | ICD-10-CM | POA: Diagnosis not present

## 2019-11-08 DIAGNOSIS — G459 Transient cerebral ischemic attack, unspecified: Secondary | ICD-10-CM | POA: Diagnosis not present

## 2019-11-08 DIAGNOSIS — E876 Hypokalemia: Secondary | ICD-10-CM

## 2019-11-08 LAB — BASIC METABOLIC PANEL
Anion gap: 10 (ref 5–15)
BUN: 18 mg/dL (ref 8–23)
CO2: 27 mmol/L (ref 22–32)
Calcium: 8 mg/dL — ABNORMAL LOW (ref 8.9–10.3)
Chloride: 101 mmol/L (ref 98–111)
Creatinine, Ser: 1.03 mg/dL — ABNORMAL HIGH (ref 0.44–1.00)
GFR calc Af Amer: >60 mL/min (ref >60)
GFR calc non Af Amer: 52 mL/min — ABNORMAL LOW (ref >60)
Glucose, Bld: 188 mg/dL — ABNORMAL HIGH (ref 70–99)
Potassium: 3.4 mmol/L — ABNORMAL LOW (ref 3.5–5.1)
Sodium: 138 mmol/L (ref 135–145)

## 2019-11-08 LAB — GLUCOSE, CAPILLARY
Glucose-Capillary: 114 mg/dL — ABNORMAL HIGH (ref 70–99)
Glucose-Capillary: 119 mg/dL — ABNORMAL HIGH (ref 70–99)
Glucose-Capillary: 157 mg/dL — ABNORMAL HIGH (ref 70–99)
Glucose-Capillary: 214 mg/dL — ABNORMAL HIGH (ref 70–99)
Glucose-Capillary: 215 mg/dL — ABNORMAL HIGH (ref 70–99)

## 2019-11-08 LAB — LIPID PANEL
Cholesterol: 84 mg/dL (ref 0–200)
HDL: 31 mg/dL — ABNORMAL LOW (ref >40)
LDL Cholesterol: 44 mg/dL (ref 0–99)
Total CHOL/HDL Ratio: 2.7 RATIO
Triglycerides: 44 mg/dL (ref <150)
VLDL: 9 mg/dL (ref 0–40)

## 2019-11-08 LAB — MAGNESIUM: Magnesium: 1.8 mg/dL (ref 1.7–2.4)

## 2019-11-08 LAB — HEMOGLOBIN A1C
Hgb A1c MFr Bld: 7.5 % — ABNORMAL HIGH (ref 4.8–5.6)
Mean Plasma Glucose: 168.55 mg/dL

## 2019-11-08 MED ORDER — POTASSIUM CHLORIDE ER 10 MEQ PO TBCR: 20.0000 meq | EXTENDED_RELEASE_TABLET | Freq: Every day | ORAL | 0 refills | Status: DC

## 2019-11-08 MED ORDER — MAGNESIUM OXIDE 400 MG PO TABS: 400.0000 mg | ORAL_TABLET | Freq: Every day | ORAL | 1 refills | Status: DC

## 2019-11-08 MED ORDER — ASPIRIN EC 81 MG PO TBEC: 81.0000 mg | DELAYED_RELEASE_TABLET | Freq: Every day | ORAL | 0 refills | Status: AC

## 2019-11-08 MED ORDER — LISINOPRIL-HYDROCHLOROTHIAZIDE 20-12.5 MG PO TABS: 0.5000 | ORAL_TABLET | Freq: Every day | ORAL | Status: DC

## 2019-11-08 NOTE — Plan of Care (Signed)
  Problem: Acute Rehab PT Goals(only PT should resolve) Goal: Pt Will Go Supine/Side To Sit Outcome: Progressing Flowsheets (Taken 11/08/2019 0935) Pt will go Supine/Side to Sit: with supervision Goal: Patient Will Transfer Sit To/From Stand Outcome: Progressing Flowsheets (Taken 11/08/2019 0935) Patient will transfer sit to/from stand: with min guard assist Goal: Pt Will Transfer Bed To Chair/Chair To Bed Outcome: Progressing Flowsheets (Taken 11/08/2019 0935) Pt will Transfer Bed to Chair/Chair to Bed: min guard assist Goal: Pt Will Ambulate Outcome: Progressing Flowsheets (Taken 11/08/2019 0935) Pt will Ambulate:  with supervision  75 feet Goal: Pt Will Go Up/Down Stairs Outcome: Progressing Flowsheets (Taken 11/08/2019 0935) Pt will Go Up / Down Stairs:  1-2 stairs  with min guard assist   9:36 AM, 11/08/19 Mearl Latin PT, DPT Physical Therapist at [facility]

## 2019-11-08 NOTE — Evaluation (Signed)
Occupational Therapy Evaluation Patient Details Name: Holly Sparks MRN: [id number] DOB: [birth year] Today's Date: 11/08/2019    History of Present Illness Holly Sparks is a 78 y.o. female with past medical history significant for hypertension, diabetes type 2, atrial fibrillation (chronically on Eliquis), chronic diastolic heart failure, hyperlipidemia, and prior history of stroke without residual deficit; who presented to the hospital after experiencing right-sided weakness affecting mainly her upper extremity.  Symptoms lasted for about 30 minutes and she presents a complete resolution of then while being assessed in the emergency department.  Patient denies any fever, chills, cough, shortness of breath, nausea, vomiting, hematuria, dysuria, headaches, blurred vision or any other complaints   Clinical Impression   Pt agreeable to OT evaluation, reporting initial symptoms have resolved. Pt performing ADLs and mobility at baseline, requiring occasional min assist for functional transfers and performing mobility with supervision/min guard. Pt reports aide and husband assist at home as needed. No further OT services required at this time as pt is at baseline with ADL completion.     Follow Up Recommendations  No OT follow up    Equipment Recommendations  None recommended by OT       Precautions / Restrictions Precautions Precautions: Fall Restrictions Weight Bearing Restrictions: No      Mobility Bed Mobility Overal bed mobility: Needs Assistance Bed Mobility: Supine to Sit     Supine to sit: Min assist        Transfers Overall transfer level: Needs assistance Equipment used: Rolling walker (2 wheeled) Transfers: Sit to/from [employer] Sit to Stand: Min assist Stand pivot transfers: Min guard                ADL either performed or assessed with clinical judgement   ADL Overall ADL's : Needs assistance/impaired Eating/Feeding: Modified  independent;Sitting   Grooming: Supervision/safety;Standing                   Toilet Transfer: Min guard;Ambulation;RW   Toileting- Clothing Manipulation and Hygiene: Supervision/safety;Sitting/lateral lean;Sit to/from stand               Vision Baseline Vision/History: No visual deficits Patient Visual Report: No change from baseline Vision Assessment?: No apparent visual deficits            Pertinent Vitals/Pain Pain Assessment: No/denies pain     Hand Dominance Right   Extremity/Trunk Assessment Upper Extremity Assessment Upper Extremity Assessment: Overall WFL for tasks assessed(4+/5 in BUE)   Lower Extremity Assessment Lower Extremity Assessment: Defer to PT evaluation   Cervical / Trunk Assessment Cervical / Trunk Assessment: Normal   Communication Communication Communication: No difficulties   Cognition Arousal/Alertness: Awake/alert Behavior During Therapy: WFL for tasks assessed/performed Overall Cognitive Status: Within Functional Limits for tasks assessed                                                Home Living Family/patient expects to be discharged to:: Private residence Living Arrangements: Spouse/significant other Available Help at Discharge: Family;Home health;Available PRN/intermittently Type of Home: House Home Access: Stairs to enter [employer] of Steps: 2 Entrance Stairs-Rails: None Home Layout: One level     Bathroom Shower/Tub: Producer, television/film/video: Standard     Home Equipment: Environmental consultant - 2 wheels;Shower seat          Prior Functioning/Environment  Level of Independence: Needs assistance  Gait / Transfers Assistance Needed: pt uses RW for mobility ADL's / Homemaking Assistance Needed: Pt has aide 5hrs/day 5 days/week for ADL assistance            OT Problem List: Decreased activity tolerance       AM-PAC OT "6 Clicks" Daily Activity     Outcome Measure Help from  another person eating meals?: None Help from another person taking care of personal grooming?: A Little Help from another person toileting, which includes using toliet, bedpan, or urinal?: A Little Help from another person bathing (including washing, rinsing, drying)?: A Lot Help from another person to put on and taking off regular upper body clothing?: A Little Help from another person to put on and taking off regular lower body clothing?: A Little 6 Click Score: 18   End of Session Equipment Utilized During Treatment: Gait belt;Rolling walker  Activity Tolerance: Patient tolerated treatment well Patient left: in chair;with call bell/phone within reach;with chair alarm set  OT Visit Diagnosis: Muscle weakness (generalized) (M62.81)                Time: 6468-0321 OT Time Calculation (min): 28 min Charges:  OT General Charges $OT Visit: 1 Visit OT Evaluation $OT Eval Low Complexity: 1 Low   Guadelupe Sabin, OTR/L  [phone] 11/08/2019, 8:48 AM

## 2019-11-08 NOTE — Evaluation (Signed)
Physical Therapy Evaluation Patient Details Name: Holly Sparks MRN: [id number] DOB: 27-Oct-1941 Today's Date: 11/08/2019   History of Present Illness  Holly Sparks is a 78 y.o. female with past medical history significant for hypertension, diabetes type 2, atrial fibrillation (chronically on Eliquis), chronic diastolic heart failure, hyperlipidemia, and prior history of stroke without residual deficit; who presented to the hospital after experiencing right-sided weakness affecting mainly her upper extremity.  Symptoms lasted for about 30 minutes and she presents a complete resolution of then while being assessed in the emergency department.  Patient denies any fever, chills, cough, shortness of breath, nausea, vomiting, hematuria, dysuria, headaches, blurred vision or any other complaints    Clinical Impression  Patient limited for functional mobility as stated below secondary to BLE weakness, fatigue and poor standing balance but appears to be near her baseline. Patient requires min assist/min guard assist for bed mobility, transfers, and ambulation. She ambulates very slowly with use of RW and fatigues quickly. She requires min verbal cueing for sequencing during session. Patient tolerated sitting in chair well and is left with RN.  Patient will benefit from continued physical therapy in hospital and recommended venue below to increase strength, balance, endurance for safe ADLs and gait.     Follow Up Recommendations Home health PT;Supervision/Assistance - 24 hour;Supervision for mobility/OOB    Equipment Recommendations  None recommended by PT    Recommendations for Other Services       Precautions / Restrictions Precautions Precautions: Fall Restrictions Weight Bearing Restrictions: No      Mobility  Bed Mobility Overal bed mobility: Needs Assistance Bed Mobility: Supine to Sit     Supine to sit: Min assist        Transfers Overall transfer level: Needs  assistance Equipment used: Rolling walker (2 wheeled) Transfers: Sit to/from [employer] Sit to Stand: Min assist Stand pivot transfers: Min guard       General transfer comment: minimal verbal cueing for sequencing  Ambulation/Gait Ambulation/Gait assistance: Min guard Gait Distance (Feet): 40 Feet Assistive device: Rolling walker (2 wheeled) Gait Pattern/deviations: Decreased step length - right;Decreased step length - left;Decreased stride length;Shuffle;Narrow base of support Gait velocity: decreased   General Gait Details: patient ambulates slowly with a shuffled gait pattern, intermittent step to pattern with RLE with RW  Stairs            Wheelchair Mobility    Modified Rankin (Stroke Patients Only)       Balance Overall balance assessment: Needs assistance Sitting-balance support: Feet supported;No upper extremity supported Sitting balance-Leahy Scale: Fair Sitting balance - Comments: seated edge of chair   Standing balance support: Bilateral upper extremity supported Standing balance-Leahy Scale: Fair Standing balance comment: Fair with use of RW                             Pertinent Vitals/Pain Pain Assessment: No/denies pain    Home Living Family/patient expects to be discharged to:: Private residence Living Arrangements: Spouse/significant other Available Help at Discharge: Family;Home health;Available PRN/intermittently Type of Home: House Home Access: Stairs to enter Entrance Stairs-Rails: None Entrance Stairs-Number of Steps: 2 Home Layout: One level Home Equipment: Walker - 2 wheels;Shower seat      Prior Function Level of Independence: Needs assistance   Gait / Transfers Assistance Needed: pt uses RW for mobility, household ambulation stated  ADL's / Homemaking Assistance Needed: Pt has aide 5hrs/day 5 days/week for ADL assistance  as well as her husband        Hand Dominance   Dominant Hand: Right     Extremity/Trunk Assessment   Upper Extremity Assessment Upper Extremity Assessment: Defer to OT evaluation    Lower Extremity Assessment Lower Extremity Assessment: Generalized weakness    Cervical / Trunk Assessment Cervical / Trunk Assessment: Normal  Communication   Communication: No difficulties  Cognition Arousal/Alertness: Awake/alert Behavior During Therapy: WFL for tasks assessed/performed Overall Cognitive Status: Within Functional Limits for tasks assessed                                        General Comments      Exercises     Assessment/Plan    PT Assessment Patient needs continued PT services  PT Problem List Decreased strength;Decreased range of motion;Decreased activity tolerance;Decreased balance;Decreased mobility;Decreased coordination       PT Treatment Interventions DME instruction;Gait training;Stair training;Functional mobility training;Therapeutic activities;Therapeutic exercise;Balance training;Neuromuscular re-education;Patient/family education    PT Goals (Current goals can be found in the Care Plan section)  Acute Rehab PT Goals Patient Stated Goal: Go home when able PT Goal Formulation: With patient Time For Goal Achievement: 11/22/19 Potential to Achieve Goals: Good    Frequency Min 3X/week   Barriers to discharge        Co-evaluation               AM-PAC PT "6 Clicks" Mobility  Outcome Measure Help needed turning from your back to your side while in a flat bed without using bedrails?: A Little Help needed moving from lying on your back to sitting on the side of a flat bed without using bedrails?: A Little Help needed moving to and from a bed to a chair (including a wheelchair)?: A Little Help needed standing up from a chair using your arms (e.g., wheelchair or bedside chair)?: A Little Help needed to walk in hospital room?: A Little Help needed climbing 3-5 steps with a railing? : A Lot 6 Click Score:  17    End of Session   Activity Tolerance: Patient tolerated treatment well;Patient limited by fatigue Patient left: in chair;with nursing/sitter in room;with call bell/phone within reach Nurse Communication: Mobility status PT Visit Diagnosis: Unsteadiness on feet (R26.81);Other abnormalities of gait and mobility (R26.89);Muscle weakness (generalized) (M62.81)    Time: 0240-9735 PT Time Calculation (min) (ACUTE ONLY): 15 min   Charges:   PT Evaluation $PT Eval Low Complexity: 1 Low PT Treatments $Therapeutic Activity: 8-22 mins        9:32 AM, 11/08/19 Mearl Latin PT, DPT Physical Therapist at [facility]

## 2019-11-08 NOTE — TOC Initial Note (Signed)
Transition of Care [facility]) - Initial/Assessment Note    Patient Details  Name: Holly Sparks MRN: [id number] Date of Birth: Jan 14, 1941  [facility] CM/SW Contact:    Yuvraj Pfeifer, Chauncey Reading, RN Phone Number: 11/08/2019, 12:31 PM  Clinical Narrative:  Patient recommended for [place]. No preference of agencies. Referred to [facility], unable to accept. Referral accepted from [place].                  Barriers to Discharge: Barriers Resolved   Patient Goals and CMS Choice Patient states their goals for this hospitalization and ongoing recovery are:: return home CMS Medicare.gov Compare Post Acute Care list provided to:: Patient Choice offered to / list presented to : Patient  Expected Discharge Plan and Services     Discharge Planning Services: CM Consult Post Acute Care Choice: [place] arrangements for the past 2 months: [place]: PT [place]: [place] Date [place]: 11/08/19 Time [place]: 2426 Representative spoke with at [place]: Georgina Snell  Prior Living Arrangements/Services Living arrangements for the past 2 months: [place] with:: Spouse   Do you feel safe going back to the place where you live?: Yes      Need for Family Participation in Patient Care: Yes (Comment) Care giver support system in place?: Yes (comment) Current home services: DME(RW)    Activities of Daily Living Home Assistive Devices/Equipment: Environmental consultant (specify type), Cane (specify quad or straight), CBG Meter, Blood pressure cuff, Eyeglasses, Bedside commode/3-in-1, Shower chair with back ADL Screening (condition at time of admission) Patient's cognitive ability adequate to safely complete daily activities?: Yes Is the patient deaf or have difficulty hearing?: Yes Does the patient have difficulty seeing, even when wearing glasses/contacts?: No Does  the patient have difficulty concentrating, remembering, or making decisions?: Yes Patient able to express need for assistance with ADLs?: Yes Does the patient have difficulty dressing or bathing?: Yes Independently performs ADLs?: No Communication: Independent Dressing (OT): Dependent Is this a change from baseline?: Pre-admission baseline Grooming: Dependent Is this a change from baseline?: Pre-admission baseline Feeding: Independent Bathing: Dependent Is this a change from baseline?: Pre-admission baseline Toileting: Dependent Is this a change from baseline?: Pre-admission baseline In/Out Bed: Dependent Is this a change from baseline?: Pre-admission baseline Walks in Home: Independent with device (comment), Needs assistance(walker) Is this a change from baseline?: Pre-admission baseline Does the patient have difficulty walking or climbing stairs?: Yes Weakness of Legs: Both Weakness of Arms/Hands: Both            Emotional Assessment   Attitude/Demeanor/Rapport: Engaged Affect (typically observed): Accepting, Appropriate Orientation: : Oriented to Self, Oriented to Place, Oriented to  Time      Admission diagnosis:  TIA (transient ischemic attack) [G45.9] Hypokalemia [E87.6] Hypomagnesemia [E83.42] Right arm weakness [R29.898] Atrial fibrillation with RVR ([place]) [I48.91] Patient Active Problem List   Diagnosis Date Noted  . TIA (transient ischemic attack) 11/07/2019  . Diabetic ulcer of right foot due to type 2 diabetes mellitus ([place]) 09/03/2018  . Grade I diastolic dysfunction 83/41/9622  . Hypomagnesemia 09/03/2018  . Paroxysmal atrial fibrillation ([employer]) 08/31/2015  . Chronic anticoagulation 08/31/2015  . Type 2 diabetes mellitus with circulatory disorder ([place]) 08/31/2015  . Left middle cerebral artery stroke ([place]) 06/16/2015  .  Cerebral infarction due to embolism of left middle cerebral artery (HCC)   . Stroke-like symptoms 06/13/2015  . HLD (hyperlipidemia)  06/13/2015  . DDD (degenerative disc disease), cervical 06/13/2015  . Aphasia 06/13/2015  . Expressive aphasia   . Cerebral infarction due to unspecified mechanism   . Type 2 diabetes mellitus without complication (HCC)   . IBS (irritable bowel syndrome) 04/24/2013  . Diabetes (HCC) 04/24/2013  . Essential hypertension, benign 04/24/2013   PCP:  Gareth Morgan, MD Pharmacy:   [facility] - [place], [place] - [address] [place] [address] Phone: [phone] Fax: [phone]  [facility] - [place], [place] - [address]  [place] [address] Phone: [phone] Fax: [phone]     Social Determinants of Health (SDOH) Interventions    Readmission Risk Interventions No flowsheet data found.

## 2019-11-08 NOTE — Discharge Summary (Signed)
Physician Discharge Summary  Holly Sparks ZOX:[id number] DOB: 1941-08-04 DOA: 11/07/2019  PCP: Gareth Morgan, MD  Admit date: 11/07/2019 Discharge date: 11/08/2019  Time spent: 35 minutes  Recommendations for Outpatient Follow-up:  1. Repeat basic metabolic panel and magnesium level to follow electrolytes stability. 2. -Outpatient follow-up with neurology service as instructed. 3. Close monitoring of patient's CBGs with further adjustment to hypoglycemic regimen as required. 4. -Reassess blood pressure and further adjust antihypertensive regimen as needed.   Discharge Diagnoses:  Active Problems:   TIA (transient ischemic attack)   Atrial fibrillation with RVR (HCC)   Hypokalemia   Right arm weakness   Discharge Condition: Stable and improved.  Patient discharged home with home health PT and instruction to follow-up with PCP and neurology as an outpatient.  Diet recommendation: Modified carbohydrate diet and heart healthy diet.  Filed Weights   11/07/19 1041  Weight: 50.8 kg    History of present illness:  78 y.o. female with past medical history significant for hypertension, diabetes type 2, atrial fibrillation (chronically on Eliquis), chronic diastolic heart failure, hyperlipidemia, and prior history of stroke without residual deficit; who presented to the hospital after experiencing right-sided weakness affecting mainly her upper extremity.  Symptoms lasted for about 30 minutes and she presents a complete resolution of then while being assessed in the emergency department.  Patient denies any fever, chills, cough, shortness of breath, nausea, vomiting, hematuria, dysuria, headaches, blurred vision or any other complaints. While in the ED on presentation she was transiently experiencing A. fib with RVR which was successfully controlled with the use of IV metoprolol.  CT head was negative for acute intracranial abnormalities; blood work demonstrated severe hypomagnesemia  and hypokalemia.  Given patient's symptoms abnormalities [facility] was contacted to admit patient for further evaluation and management of electrolyte abnormality and TIA work-up.  Neurology was consulted.  Gentle IV fluids resuscitation and electrolyte repletion initiated while in the ER.  Hospital Course:  1-right-sided weakness/TIA -Work-up for acute stroke negative -Case discussed with neurology service and recommendations given to use baby aspirin 81 mg along with Eliquis for 2 weeks and outpatient follow-up in 3 weeks with pain. -Continue risk factor modification is seen antihypertensive agents, statins and blood sugar control. -Home health PT has been ordered at time of discharge to assist with transitioning of care and conditioning.  2-hypokalemia/hypomagnesemia -Repleted and within normal limits at discharge -Patient will be instructed to use daily potassium and Mag-Ox supplementation. -Repeat magnesium Ms. metabolic panel follow-up visit to reassess stability.  3-atrial fibrillation -Mild transient A. fib with RVR on presentation. -Rays of patient's telemetry evaluation remained stable and with controlled rate. -She remains in chronic A. fib at this time. -Continue the use of Cardizem and metoprolol for rate control -Continue Eliquis for secondary prevention as previously instructed -Continue patient follow-up with cardiology service.  4-type 2 diabetes mellitus: A1c 7.5 -Continue modified carbohydrate diet and the use of Metformin -Close follow-up with PCP to further adjust hypoglycemic regimen as needed.  5-hyperlipidemia -Lipid panel demonstrated a stable LDL -Continue the use of a statins. -Heart healthy diet encouraged.  6-essential hypertension -Overall stable -Resume home antihypertensive regimen -Heart healthy diet instructed.  7-chronic diastolic heart failure -Stable and compensated -Low-sodium diet, daily weights and resumption of home lisinopril/HCTZ at discharge  pursued. -Patient will also continue the use of beta-blocker. -Outpatient follow-up with cardiology service recommended.  8-gastroesophageal flux disease -Continue PPI.  Procedures:  See below for x-ray report  Carotid duplex: Minimal plaque buildup bilaterally.  No significant/hemodynamically impairing stenosis.  2D echo:  1. Left ventricular ejection fraction, by visual estimation, is 60 to 65%. The left ventricle has normal function. There is mildly increased left ventricular hypertrophy.  2. Left ventricular diastolic parameters are indeterminate.  3. The left ventricle has no regional wall motion abnormalities.  4. Global right ventricle has normal systolic function.The right ventricular size is normal. No increase in right ventricular wall thickness.  5. Left atrial size was normal.  6. Right atrial size was normal.  7. Mild mitral annular calcification.  8. The mitral valve is grossly normal. Trivial mitral valve regurgitation.  9. The tricuspid valve is grossly normal. Tricuspid valve regurgitation is trivial. 10. The aortic valve is tricuspid. Aortic valve regurgitation is not visualized. 11. The pulmonic valve was not well visualized. Pulmonic valve regurgitation is not visualized. 12. The inferior vena cava is normal in size with greater than 50% respiratory variability, suggesting right atrial pressure of 3 mmHg.  Consultations:  Neurology ([place]); recommendations given for baby aspirin along with Eliquis for 2 weeks and outpatient follow-up in 3 weeks with him.  Discharge Exam: Vitals:   11/08/19 0159 11/08/19 0600  BP: (!) 88/55 128/82  Pulse: (!) 54 90  Resp: 18 16  Temp: (!) 97.4 F (36.3 C) 97.8 F (36.6 C)  SpO2: 100% 95%    General: Afebrile, no chest pain, no nausea, no vomiting.  Patient feels back to her baseline and denies any focal neurologic deficits currently. Cardiovascular: Irregular rhythm, rate controlled, no rubs, no gallops, no  JVD. Respiratory: Good air movement bilaterally, no wheezing, no crackles; no requiring oxygen supplementation and with normal respiratory effort. Abdomen: Soft, nontender, nondistended, positive bowel sounds Extremities: No edema, no cyanosis, no clubbing.  Discharge Instructions   Discharge Instructions    (HEART FAILURE PATIENTS) Call MD:  Anytime you have any of the following symptoms: 1) 3 pound weight gain in 24 hours or 5 pounds in 1 week 2) shortness of breath, with or without a dry hacking cough 3) swelling in the hands, feet or stomach 4) if you have to sleep on extra pillows at night in order to breathe.   Complete by: As directed    Diet - low sodium heart healthy   Complete by: As directed    Discharge instructions   Complete by: As directed    Take medications as prescribed Follow heart healthy diet Keep yourself well-hydrated Arrange follow-up with PCP in 10 days Follow-up with neurology service (Dr. Gerilyn Pilgrim) in 3-4 weeks.     Allergies as of 11/08/2019   No Known Allergies     Medication List    STOP taking these medications   doxycycline 100 MG capsule Commonly known as: VIBRAMYCIN     TAKE these medications   acetaminophen 325 MG tablet Commonly known as: TYLENOL Take 650 mg by mouth every 6 (six) hours as needed.   apixaban 5 MG Tabs tablet Commonly known as: ELIQUIS Take 1 tablet (5 mg total) by mouth 2 (two) times daily.   aspirin EC 81 MG tablet Take 1 tablet (81 mg total) by mouth daily for 14 days.   atorvastatin 20 MG tablet Commonly known as: LIPITOR Take 1 tablet (20 mg total) by mouth at bedtime.   diltiazem 120 MG 24 hr capsule Commonly known as: Cardizem CD Take 1 capsule (120 mg total) by mouth daily.   feeding supplement (PRO-STAT SUGAR FREE 64) Liqd Take 30 mLs by mouth 2 (two) times daily  between meals.   lisinopril-hydrochlorothiazide 20-12.5 MG tablet Commonly known as: ZESTORETIC Take 0.5 tablets by mouth daily. What  changed: how much to take   magnesium oxide 400 MG tablet Commonly known as: MAG-OX Take 1 tablet (400 mg total) by mouth daily.   metFORMIN 1000 MG tablet Commonly known as: GLUCOPHAGE Take 500 mg by mouth 2 (two) times daily with a meal.   metoprolol tartrate 25 MG tablet Commonly known as: LOPRESSOR Take 12.5 mg by mouth 2 (two) times daily.   ondansetron 4 MG tablet Commonly known as: ZOFRAN Take 4 mg by mouth every 6 (six) hours as needed for nausea or vomiting.   pantoprazole 40 MG tablet Commonly known as: PROTONIX Take 40 mg by mouth daily.   potassium chloride 10 MEQ tablet Commonly known as: KLOR-CON Take 2 tablets (20 mEq total) by mouth daily.      No Known Allergies Follow-up Information    [facility] Follow up.   Why: home health PT Contact information: [address] [place] [address] [phone]       Gareth Morgan, MD. Schedule an appointment as soon as possible for a visit in 10 day(s).   Specialty: Family Medicine Contact information: [address] [place] [address] [phone]        Beryle Beams, MD. Schedule an appointment as soon as possible for a visit in 3 week(s).   Specialty: Neurology Contact information: [address] [place] [address] [phone]           The results of significant diagnostics from this hospitalization (including imaging, microbiology, ancillary and laboratory) are listed below for reference.    Significant Diagnostic Studies: CT HEAD WO CONTRAST  Result Date: 11/07/2019 CLINICAL DATA:  TIA EXAM: CT HEAD WITHOUT CONTRAST TECHNIQUE: Contiguous axial images were obtained from the base of the skull through the vertex without intravenous contrast. COMPARISON:  2016 FINDINGS: Brain: There is no acute intracranial hemorrhage, mass-effect, or edema. There is no acute appearing loss of gray-white differentiation. Chronic infarcts are present involving the  left basal ganglia and temporal lobe. Additional patchy hypoattenuation in the supratentorial white matter is nonspecific but probably reflects mild chronic microvascular ischemic changes. There is no extra-axial fluid collection. Prominence of the ventricles and sulci reflects mild parenchymal volume loss. Vascular: There is atherosclerotic calcification at the skull base. Skull: Calvarium is unremarkable. Sinuses/Orbits: Partial opacification of the left sphenoid sinus with evidence of chronic inflammation. Other: None. IMPRESSION: No acute intracranial hemorrhage, mass effect, or evidence of acute infarction. Chronic/nonemergent findings detailed above. Electronically Signed   By: Guadlupe Spanish M.D.   On: 11/07/2019 12:16   MR BRAIN WO CONTRAST  Result Date: 11/07/2019 CLINICAL DATA:  Right arm weakness beginning today. Fell last week. EXAM: MRI HEAD WITHOUT CONTRAST TECHNIQUE: Multiplanar, multiecho pulse sequences of the brain and surrounding structures were obtained without intravenous contrast. COMPARISON:  Head CT same day FINDINGS: Brain: Diffusion imaging does not show any acute or subacute infarction. No focal insult affects the brainstem or cerebellum. There is some brainstem and cerebellar atrophy. Cerebral hemispheres show generalized atrophy with old infarction in the left basal ganglia and radiating white matter tracts particularly the external capsule. There are old left temporal cortical and subcortical infarctions. There is an old left frontoparietal junction cortical infarction. No mass lesion, hemorrhage, hydrocephalus or extra-axial collection. Vascular: Major vessels at the base of the brain show flow. Skull and upper cervical spine: Negative Sinuses/Orbits: Clear/normal Other: None IMPRESSION:  No acute or reversible finding. Generalized brain atrophy. Old left basal ganglia and radiating white matter infarctions. Old cortical infarctions of the left temporal lobe. Small old cortical  infarction at the left frontoparietal junction. Electronically Signed   By: Paulina Fusi M.D.   On: 11/07/2019 18:13   US Carotid Bilateral (at [facility] and [facility] only)  Result Date: 11/08/2019 CLINICAL DATA:  TIA.  History of hypertension and diabetes. EXAM: BILATERAL CAROTID DUPLEX ULTRASOUND TECHNIQUE: Wallace Cullens scale imaging, color Doppler and duplex ultrasound were performed of bilateral carotid and vertebral arteries in the neck. COMPARISON:  None. FINDINGS: Criteria: Quantification of carotid stenosis is based on velocity parameters that correlate the residual internal carotid diameter with NASCET-based stenosis levels, using the diameter of the distal internal carotid lumen as the denominator for stenosis measurement. The following velocity measurements were obtained: RIGHT ICA: 75/22 cm/sec CCA: 70/11 cm/sec SYSTOLIC ICA/CCA RATIO:  1.0 ECA: 82 cm/sec LEFT ICA: 83/23 cm/sec CCA: 69/11 cm/sec SYSTOLIC ICA/CCA RATIO:  1.2 ECA: 79 cm/sec RIGHT CAROTID ARTERY: There is a minimal amount of intimal wall thickening seen throughout the right carotid artery. There is a minimal amount of eccentric mixed echogenic plaque within the right carotid bulb, extending to involve the origin and proximal aspects of the right internal carotid artery (image 35), not resulting in elevated peak systolic velocities within the interrogated course the right internal carotid artery to suggest a hemodynamically significant stenosis. RIGHT VERTEBRAL ARTERY:  Antegrade flow LEFT CAROTID ARTERY: There is a minimal amount of intimal thickening seen throughout the left common carotid artery. There is a minimal amount of eccentric mixed echogenic plaque within the left carotid bulb (image 71 and 73), extending to involve the origin and proximal aspects of the left internal carotid artery (image 87), not resulting in elevated peak systolic velocities within the interrogated course the left internal carotid artery to suggest a hemodynamically  significant stenosis. LEFT VERTEBRAL ARTERY:  Antegrade flow IMPRESSION: Minimal amount of bilateral atherosclerotic plaque, left subjectively greater than right, not resulting in a hemodynamically significant stenosis within either internal carotid artery. Electronically Signed   By: Simonne Come M.D.   On: 11/08/2019 07:31   DG Chest Portable 1 View  Result Date: 11/07/2019 CLINICAL DATA:  Weakness.  Recurrent falls. EXAM: PORTABLE CHEST 1 VIEW COMPARISON:  PA and lateral chest 09/03/2018. FINDINGS: Lungs clear. Heart size normal. Aortic atherosclerosis. No pneumothorax or pleural fluid. No acute or focal bony abnormality. IMPRESSION: No acute disease. Atherosclerosis. Electronically Signed   By: Drusilla Kanner M.D.   On: 11/07/2019 13:48   ECHOCARDIOGRAM COMPLETE  Result Date: 11/07/2019   ECHOCARDIOGRAM REPORT   Patient Name:   ILY DENNO Date of Exam: 11/07/2019 Medical Rec #:  [id number]         Height:       67.0 in Accession #:    [id number]        Weight:       112.0 lb Date of Birth:  04-07-1941         BSA:          1.58 m Patient Age:    78 years          BP:           148/103 mmHg Patient Gender: F                 HR:           91 bpm. [facility]am [name] Procedure:  2D Echo Indications:    TIA 435.9 / G45.9  History:        Patient has prior history of Echocardiogram examinations, most                 recent 06/14/2015. TIA and Stroke, Arrythmias:Atrial                 Fibrillation; Risk Factors:Diabetes, Hypertension, Non-Smoker                 and Dyslipidemia. Chronic anticoagulation.  Sonographer:    Leavy Cella RDCS (AE) Referring Phys: [place]  1. Left ventricular ejection fraction, by visual estimation, is 60 to 65%. The left ventricle has normal function. There is mildly increased left ventricular hypertrophy.  2. Left ventricular diastolic parameters are indeterminate.  3. The left ventricle has no regional wall motion abnormalities.  4. Global  right ventricle has normal systolic function.The right ventricular size is normal. No increase in right ventricular wall thickness.  5. Left atrial size was normal.  6. Right atrial size was normal.  7. Mild mitral annular calcification.  8. The mitral valve is grossly normal. Trivial mitral valve regurgitation.  9. The tricuspid valve is grossly normal. Tricuspid valve regurgitation is trivial. 10. The aortic valve is tricuspid. Aortic valve regurgitation is not visualized. 11. The pulmonic valve was not well visualized. Pulmonic valve regurgitation is not visualized. 12. The inferior vena cava is normal in size with greater than 50% respiratory variability, suggesting right atrial pressure of 3 mmHg. FINDINGS  Left Ventricle: Left ventricular ejection fraction, by visual estimation, is 60 to 65%. The left ventricle has normal function. The left ventricle has no regional wall motion abnormalities. The left ventricular internal cavity size was the left ventricle is normal in size. There is mildly increased left ventricular hypertrophy. Concentric left ventricular hypertrophy. Left ventricular diastolic parameters are indeterminate. Right Ventricle: The right ventricular size is normal. No increase in right ventricular wall thickness. Global RV systolic function is has normal systolic function. Left Atrium: Left atrial size was normal in size. Right Atrium: Right atrial size was normal in size Pericardium: There is no evidence of pericardial effusion. Mitral Valve: The mitral valve is grossly normal. Mild mitral annular calcification. Trivial mitral valve regurgitation. Tricuspid Valve: The tricuspid valve is grossly normal. Tricuspid valve regurgitation is trivial. Aortic Valve: The aortic valve is tricuspid. Aortic valve regurgitation is not visualized. Pulmonic Valve: The pulmonic valve was not well visualized. Pulmonic valve regurgitation is not visualized. Pulmonic regurgitation is not visualized. Aorta: The  aortic root is normal in size and structure. Venous: The inferior vena cava is normal in size with greater than 50% respiratory variability, suggesting right atrial pressure of 3 mmHg. IAS/Shunts: No atrial level shunt detected by color flow Doppler.  LEFT VENTRICLE PLAX 2D LVIDd:         3.13 cm  Diastology LVIDs:         2.31 cm  LV e' lateral:   8.16 cm/s LV PW:         1.47 cm  LV E/e' lateral: 12.0 LV IVS:        1.13 cm  LV e' medial:    8.05 cm/s LVOT diam:     1.80 cm  LV E/e' medial:  12.1 LV SV:         20 ml LV SV Index:   13.33 LVOT Area:     2.54 cm  RIGHT VENTRICLE RV S  prime:     8.27 cm/s TAPSE (M-mode): 1.9 cm LEFT ATRIUM             Index       RIGHT ATRIUM           Index LA diam:        4.20 cm 2.66 cm/m  RA Area:     11.60 cm LA Vol (A2C):   55.8 ml 35.30 ml/m RA Volume:   23.70 ml  14.99 ml/m LA Vol (A4C):   46.7 ml 29.55 ml/m LA Biplane Vol: 52.0 ml 32.90 ml/m   AORTA Ao Root diam: 2.90 cm MITRAL VALVE MV Area (PHT): 4.04 cm            SHUNTS MV PHT:        54.52 msec          Systemic Diam: 1.80 cm MV Decel Time: 188 msec MV E velocity: 97.53 cm/s 103 cm/s  Prentice Docker MD Electronically signed by Prentice Docker MD Signature Date/Time: 11/07/2019/3:19:37 PM    Final     Microbiology: Recent Results (from the past 240 hour(s))  SARS CORONAVIRUS 2 (TAT 6-24 HRS) Nasopharyngeal Nasopharyngeal Swab     Status: None   Collection Time: 11/07/19 11:10 AM   Specimen: Nasopharyngeal Swab  Result Value Ref Range Status   SARS Coronavirus 2 NEGATIVE NEGATIVE Final    Comment: (NOTE) SARS-CoV-2 target nucleic acids are NOT DETECTED. The SARS-CoV-2 RNA is generally detectable in upper and lower respiratory specimens during the acute phase of infection. Negative results do not preclude SARS-CoV-2 infection, do not rule out co-infections with other pathogens, and should not be used as the sole basis for treatment or other patient management decisions. Negative results must  be combined with clinical observations, patient history, and epidemiological information. The expected result is Negative. Fact Sheet for Patients: HairSlick.no Fact Sheet for Healthcare Providers: quierodirigir.com This test is not yet approved or cleared by the [place] FDA and  has been authorized for detection and/or diagnosis of SARS-CoV-2 by FDA under an Emergency Use Authorization (EUA). This EUA will remain  in effect (meaning this test can be used) for the duration of the COVID-19 declaration under Section 56 4(b)(1) of the Act, 21 U.S.C. section 360bbb-3(b)(1), unless the authorization is terminated or revoked sooner. Performed at [facility], [address] [place], [place] [address]      Labs: Basic Metabolic Panel: Recent Labs  Lab 11/07/19 1113 11/07/19 1128 11/08/19 0444 11/08/19 0940  NA 136 137  --  138  K 2.8* 2.9*  --  3.4*  CL 93* 92*  --  101  CO2 29  --   --  27  GLUCOSE 147* 141*  --  188*  BUN 23 21  --  18  CREATININE 1.18* 1.20*  --  1.03*  CALCIUM 8.3*  --   --  8.0*  MG 0.7*  --  1.8  --    Liver Function Tests: Recent Labs  Lab 11/07/19 1113  AST 19  ALT 11  ALKPHOS 62  BILITOT 0.9  PROT 6.5  ALBUMIN 3.4*   CBC: Recent Labs  Lab 11/07/19 1113 11/07/19 1128  WBC 5.6  --   NEUTROABS 4.0  --   HGB 12.2 12.6  HCT 38.4 37.0  MCV 90.6  --   PLT 155  --    CBG: Recent Labs  Lab 11/07/19 2005 11/07/19 2359 11/08/19 0456 11/08/19 0745 11/08/19 1148  GLUCAP 151* 215* 157* 114* 214*     Signed:  Vassie Lollarlos Freeda Spivey MD.  [facility] 11/08/2019, 2:09 PM

## 2019-11-08 NOTE — Progress Notes (Signed)
Discharge and stroke education/book reviewed with patient and patient's husband.  Both verbalized understanding of instructions. Patient discharged home with husband in stable condition.

## 2019-11-08 NOTE — Progress Notes (Signed)
SLP Cancellation Note  Patient Details Name: Holly Sparks MRN: [id number] DOB: [birth year]   Cancelled treatment:       Reason Eval/Treat Not Completed: SLP screened, no needs identified, will sign off. Pt presents at cognitive/linguistic baseline - reports initial symptoms have resolved, there are no further ST needs noted at this time.  Amelia H. Roddie Mc, CCC-SLP Speech Language Pathologist    Wende Bushy 11/08/2019, 10:25 AM

## 2019-11-18 ENCOUNTER — Encounter (HOSPITAL_COMMUNITY): Payer: Self-pay | Admitting: *Deleted

## 2019-11-18 ENCOUNTER — Emergency Department (HOSPITAL_COMMUNITY)
Admission: EM | Admit: 2019-11-18 | Discharge: 2019-11-18 | Disposition: A | Discharge status: Home / Self Care | Payer: Medicare Other | Attending: Emergency Medicine | Admitting: Emergency Medicine

## 2019-11-18 ENCOUNTER — Other Ambulatory Visit: Payer: Self-pay

## 2019-11-18 ENCOUNTER — Emergency Department (HOSPITAL_COMMUNITY): Payer: Medicare Other

## 2019-11-18 DIAGNOSIS — Z7984 Long term (current) use of oral hypoglycemic drugs: Secondary | ICD-10-CM | POA: Insufficient documentation

## 2019-11-18 DIAGNOSIS — Z7901 Long term (current) use of anticoagulants: Secondary | ICD-10-CM | POA: Diagnosis not present

## 2019-11-18 DIAGNOSIS — I10 Essential (primary) hypertension: Secondary | ICD-10-CM | POA: Diagnosis not present

## 2019-11-18 DIAGNOSIS — R238 Other skin changes: Secondary | ICD-10-CM

## 2019-11-18 DIAGNOSIS — M79671 Pain in right foot: Secondary | ICD-10-CM | POA: Diagnosis present

## 2019-11-18 DIAGNOSIS — Z79899 Other long term (current) drug therapy: Secondary | ICD-10-CM | POA: Insufficient documentation

## 2019-11-18 DIAGNOSIS — E119 Type 2 diabetes mellitus without complications: Secondary | ICD-10-CM | POA: Insufficient documentation

## 2019-11-18 DIAGNOSIS — Z8673 Personal history of transient ischemic attack (TIA), and cerebral infarction without residual deficits: Secondary | ICD-10-CM | POA: Insufficient documentation

## 2019-11-18 LAB — I-STAT CHEM 8, ED
BUN: 27 mg/dL — ABNORMAL HIGH (ref 8–23)
Calcium, Ion: 0.95 mmol/L — ABNORMAL LOW (ref 1.15–1.40)
Chloride: 102 mmol/L (ref 98–111)
Creatinine, Ser: 1 mg/dL (ref 0.44–1.00)
Glucose, Bld: 128 mg/dL — ABNORMAL HIGH (ref 70–99)
HCT: 37 % (ref 36.0–46.0)
Hemoglobin: 12.6 g/dL (ref 12.0–15.0)
Potassium: 4.8 mmol/L (ref 3.5–5.1)
Sodium: 134 mmol/L — ABNORMAL LOW (ref 135–145)
TCO2: 24 mmol/L (ref 22–32)

## 2019-11-18 LAB — CBG MONITORING, ED: Glucose-Capillary: 123 mg/dL — ABNORMAL HIGH (ref 70–99)

## 2019-11-18 MED ORDER — DOXYCYCLINE HYCLATE 100 MG PO CAPS: 100.0000 mg | ORAL_CAPSULE | Freq: Two times a day (BID) | ORAL | 0 refills | Status: DC

## 2019-11-18 NOTE — ED Triage Notes (Signed)
Seen previously for same complaint, according to husband foot is more discolored than usual today, concerned it may be getting infected

## 2019-11-18 NOTE — ED Provider Notes (Signed)
[facility] EMERGENCY DEPARTMENT Provider Note   CSN: [id number] Arrival date & time: 11/18/19  1255     History Chief Complaint  Patient presents with  . Foot Pain    [name] is a 78 y.o. female.  HPI      [name] is a 78 y.o. female with hx of DM, atrial fib, HTN and stroke,  presents to the Emergency Department complaining of blister to the top of the right foot and medial lower leg.  Blisters have been present for several days.  Patient's husband states this is a recurring problem for her.  He is concerned that her foot appears dark red in color and he is concerned that foot is becoming infected.  Patient denies pain and itching of her foot.  She also denies injury, numbness of her foot or toes.  She has a similar episode about one month ago that resolved after taking antibiotics.  She also complains of swelling to both feet and husband states that she sits most of the day in a chair with her legs down. She struck her right lower lower on the edge of the car door a few weeks ago, but states the area has been healing well.  No chest pain, shortness of breath, fever or chills.  Husband states that since COVID, she has not been able to see her PCP in office.      Past Medical History:  Diagnosis Date  . Diabetes (HCC)    Type 2 x 10 yrs  . Dysrhythmia    AFib  . GERD (gastroesophageal reflux disease)   . Grade I diastolic dysfunction 09/03/2018  . High cholesterol   . Hypertension    x 10 yrs  . Paroxysmal a-fib: Remote hx of afib 06/15/2015  . Stroke [facility](HCC)    no deficits    Patient Active Problem List   Diagnosis Date Noted  . Atrial fibrillation with RVR (HCC)   . Hypokalemia   . Right arm weakness   . TIA (transient ischemic attack) 11/07/2019  . 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 Grade I diastolic dysfunction 09/03/2018  . Hypomagnesemia 09/03/2018  . Paroxysmal atrial fibrillation (HCC) 08/31/2015   . Chronic anticoagulation 08/31/2015  . Type 2 diabetes mellitus with circulatory disorder (HCC) 08/31/2015  . 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 Cerebral infarction due to embolism of left middle cerebral artery (HCC)   . Stroke-like symptoms 06/13/2015  . HLD (hyperlipidemia) 06/13/2015  . DDD (degenerative disc disease), cervical 06/13/2015  . Aphasia 06/13/2015  . Expressive aphasia   . Cerebral infarction due to unspecified mechanism   . Type 2 diabetes mellitus without complication (HCC)   . IBS (irritable bowel syndrome) 04/24/2013  . Diabetes (HCC) 04/24/2013  . Essential hypertension, benign 04/24/2013    Past Surgical History:  Procedure Laterality Date  . CATARACT EXTRACTION W/PHACO Left 06/06/2016   Procedure: CATARACT EXTRACTION PHACO AND INTRAOCULAR LENS PLACEMENT (IOC);  Surgeon: Gemma PayorKerry Hunt, MD;  Location: [facility] ORS;  Service: Ophthalmology;  Laterality: Left;  CDE: 8.32  . COLONOSCOPY    . COLONOSCOPY N/A 05/08/2013   Procedure: COLONOSCOPY;  Surgeon: Malissa HippoNajeeb U Rehman, MD;  Location: [facility] ENDO SUITE;  Service: Endoscopy;  Laterality: N/A;  1015  . NECK SURGERY     x 2 for spur and disc problems     OB History   No obstetric history on file.  Family History  Problem Relation Age of Onset  . Diabetes Father     Social History   Tobacco Use  . Smoking status: Never Smoker  . Smokeless tobacco: Never Used  Substance Use Topics  . Alcohol use: No    Alcohol/week: 0.0 standard drinks  . Drug use: No    Home Medications Prior to Admission medications   Medication Sig Start Date End Date Taking? Authorizing Provider  acetaminophen (TYLENOL) 325 MG tablet Take 650 mg by mouth every 6 (six) hours as needed.    [provider]  Amino Acids-Protein Hydrolys (FEEDING SUPPLEMENT, PRO-STAT SUGAR FREE 64,) LIQD Take 30 mLs by mouth 2 (two) times daily between meals.    [provider]  apixaban (ELIQUIS) 5 MG TABS tablet Take 1  tablet (5 mg total) by mouth 2 (two) times daily. 06/26/15   Angiulli, Mcarthur Rossetti, PA-C  aspirin EC 81 MG tablet Take 1 tablet (81 mg total) by mouth daily for 14 days. 11/08/19 11/22/19  Vassie Loll, MD  atorvastatin (LIPITOR) 20 MG tablet Take 1 tablet (20 mg total) by mouth at bedtime. 06/26/15   Angiulli, Mcarthur Rossetti, PA-C  diltiazem (CARDIZEM CD) 120 MG 24 hr capsule Take 1 capsule (120 mg total) by mouth daily. 01/21/18   Bethann Berkshire, MD  lisinopril-hydrochlorothiazide (ZESTORETIC) 20-12.5 MG tablet Take 0.5 tablets by mouth daily. 11/08/19   Vassie Loll, MD  magnesium oxide (MAG-OX) 400 MG tablet Take 1 tablet (400 mg total) by mouth daily. 11/08/19   Vassie Loll, MD  metFORMIN (GLUCOPHAGE) 1000 MG tablet Take 500 mg by mouth 2 (two) times daily with a meal.    [provider]  metoprolol tartrate (LOPRESSOR) 25 MG tablet Take 12.5 mg by mouth 2 (two) times daily.    [provider]  ondansetron (ZOFRAN) 4 MG tablet Take 4 mg by mouth every 6 (six) hours as needed for nausea or vomiting.    [provider]  pantoprazole (PROTONIX) 40 MG tablet Take 40 mg by mouth daily.    [provider]  potassium chloride (KLOR-CON) 10 MEQ tablet Take 2 tablets (20 mEq total) by mouth daily. 11/08/19   Vassie Loll, MD    Allergies    Patient has no known allergies.  Review of Systems   Review of Systems  Constitutional: Negative for chills and fever.  Respiratory: Negative for chest tightness and shortness of breath.   Cardiovascular: Positive for leg swelling (swelling to both feet). Negative for chest pain.  Musculoskeletal: Negative for arthralgias, back pain and myalgias.  Skin: Positive for color change and wound.       Redness to both feet, right > left.  Blister of right foot  Neurological: Negative for weakness and numbness.    Physical Exam Updated Vital Signs BP 124/74 (BP Location: Right Arm)   Pulse 63   Temp 98.2 F (36.8 C) (Oral)    Resp 16   Ht  (1.702 m)   Wt 58.1 kg   SpO2 99%   BMI 20.05 kg/m   Physical Exam Vitals and nursing note reviewed.  Constitutional:      General: She is not in acute distress.    Appearance: Normal appearance. She is well-developed.  HENT:     Head: Normocephalic.  Cardiovascular:     Rate and Rhythm: Normal rate. Rhythm irregular.     Comments: Bilateral DP and PT pulses heard with portable doppler Pulmonary:     Effort: Pulmonary effort is  normal.     Breath sounds: Normal breath sounds.  Musculoskeletal:        General: Tenderness present.     Comments: No tenderness of the right foot or ankle through ROM.  Sensation itnact  Skin:    General: Skin is warm and dry.     Capillary Refill: Capillary refill takes 2 to 3 seconds.     Comments: 2 cm bulla to the dorsal aspect of the right foot and 1 cm bulla to medial right lower leg.  No excessive warmth, no drainage.  See attached photos.  Neurological:     General: No focal deficit present.     Mental Status: She is alert and oriented to person, place, and time.     Sensory: Sensation is intact. No sensory deficit.     Motor: Motor function is intact. No weakness.     Coordination: Coordination normal.     Comments: CN II-XII grossly intact          ED Results / Procedures / Treatments   Labs (all labs ordered are listed, but only abnormal results are displayed) Labs Reviewed  CBG MONITORING, ED - Abnormal; Notable for the following components:      Result Value   Glucose-Capillary 123 (*)    All other components within normal limits  I-STAT CHEM 8, ED - Abnormal; Notable for the following components:   Sodium 134 (*)    BUN 27 (*)    Glucose, Bld 128 (*)    Calcium, Ion 0.95 (*)    All other components within normal limits    EKG None  Radiology DG Foot Complete Right  Result Date: 11/18/2019 CLINICAL DATA:  Right foot swelling and redness. No known injury. Type 2 diabetes. EXAM: RIGHT FOOT  COMPLETE - 3+ VIEW COMPARISON:  09/03/2018 and right foot MR dated 09/04/2018. FINDINGS: Moderate degenerative changes at the 1st MTP joint. Moderate posterior and inferior calcaneal enthesophyte formation. Mild diffuse dorsal soft tissue swelling, most pronounced distally. No soft tissue gas, bone destruction, periosteal reaction or fracture seen. IMPRESSION: 1. Mild diffuse dorsal soft tissue swelling without underlying bony abnormality. 2. Moderate degenerative changes at the 1st MTP joint. Electronically Signed   By: Claudie Revering M.D.   On: 11/18/2019 14:55    Procedures Procedures (including critical care time)  Medications Ordered in ED Medications - No data to display  ED Course  I have reviewed the triage vital signs and the nursing notes.  Pertinent labs & imaging results that were available during my care of the patient were reviewed by me and considered in my medical decision making (see chart for details).    MDM Rules/Calculators/A&P                      Pt with hx of recurrent blisters to the dorsal right foot.  Single bulla of the dorsal foot and another similar appearing one to the medial lower leg.  No pustules, induration or grouped vesicles. Husband reports previous resolution after antibiotics.  Clinically, area does not appear cellulitic. NV intact. extremities are warm and pulses hear with doppler.  I have advised her to elevate legs as much as possible.  Rx written for doxy and she agrees to close PCP f/u.  Return precautions discussed   Final Clinical Impression(s) / ED Diagnoses Final diagnoses:  Skin bulla    Rx / DC Orders ED Discharge Orders    None  Pauline Aus, PA-C 11/19/19 1617    Raeford Razor, MD 11/24/19 1026

## 2019-11-18 NOTE — Discharge Instructions (Addendum)
Its important that you keep your feet elevated as much as possible.  Take the antibiotic as directed until its finished.  Call Dr. Vickey Sages office to arrange a follow-up appt for recheck.

## 2019-11-28 ENCOUNTER — Other Ambulatory Visit: Payer: Self-pay

## 2019-11-28 ENCOUNTER — Emergency Department (HOSPITAL_COMMUNITY): Payer: Medicare Other

## 2019-11-28 ENCOUNTER — Inpatient Hospital Stay (HOSPITAL_COMMUNITY)
Admission: EM | Admit: 2019-11-28 | Discharge: 2019-12-04 | DRG: 065 | Disposition: A | Discharge status: Skilled Nursing Facility | Payer: Medicare Other | Attending: Internal Medicine | Admitting: Internal Medicine

## 2019-11-28 DIAGNOSIS — I110 Hypertensive heart disease with heart failure: Secondary | ICD-10-CM | POA: Diagnosis present

## 2019-11-28 DIAGNOSIS — E1165 Type 2 diabetes mellitus with hyperglycemia: Secondary | ICD-10-CM | POA: Diagnosis present

## 2019-11-28 DIAGNOSIS — R627 Adult failure to thrive: Secondary | ICD-10-CM | POA: Diagnosis present

## 2019-11-28 DIAGNOSIS — I480 Paroxysmal atrial fibrillation: Secondary | ICD-10-CM | POA: Diagnosis present

## 2019-11-28 DIAGNOSIS — R2689 Other abnormalities of gait and mobility: Secondary | ICD-10-CM | POA: Diagnosis present

## 2019-11-28 DIAGNOSIS — Z79899 Other long term (current) drug therapy: Secondary | ICD-10-CM

## 2019-11-28 DIAGNOSIS — R531 Weakness: Secondary | ICD-10-CM | POA: Diagnosis present

## 2019-11-28 DIAGNOSIS — E785 Hyperlipidemia, unspecified: Secondary | ICD-10-CM | POA: Diagnosis present

## 2019-11-28 DIAGNOSIS — Z66 Do not resuscitate: Secondary | ICD-10-CM | POA: Diagnosis present

## 2019-11-28 DIAGNOSIS — Z7984 Long term (current) use of oral hypoglycemic drugs: Secondary | ICD-10-CM | POA: Diagnosis not present

## 2019-11-28 DIAGNOSIS — R251 Tremor, unspecified: Secondary | ICD-10-CM | POA: Diagnosis present

## 2019-11-28 DIAGNOSIS — Z20822 Contact with and (suspected) exposure to covid-19: Secondary | ICD-10-CM | POA: Diagnosis present

## 2019-11-28 DIAGNOSIS — I10 Essential (primary) hypertension: Secondary | ICD-10-CM | POA: Diagnosis not present

## 2019-11-28 DIAGNOSIS — R29703 NIHSS score 3: Secondary | ICD-10-CM | POA: Diagnosis present

## 2019-11-28 DIAGNOSIS — E1159 Type 2 diabetes mellitus with other circulatory complications: Secondary | ICD-10-CM | POA: Diagnosis not present

## 2019-11-28 DIAGNOSIS — K219 Gastro-esophageal reflux disease without esophagitis: Secondary | ICD-10-CM | POA: Diagnosis present

## 2019-11-28 DIAGNOSIS — R29810 Facial weakness: Secondary | ICD-10-CM | POA: Diagnosis present

## 2019-11-28 DIAGNOSIS — R2990 Unspecified symptoms and signs involving the nervous system: Secondary | ICD-10-CM | POA: Diagnosis not present

## 2019-11-28 DIAGNOSIS — Z833 Family history of diabetes mellitus: Secondary | ICD-10-CM | POA: Diagnosis not present

## 2019-11-28 DIAGNOSIS — I6381 Other cerebral infarction due to occlusion or stenosis of small artery: Principal | ICD-10-CM | POA: Diagnosis present

## 2019-11-28 DIAGNOSIS — Z7901 Long term (current) use of anticoagulants: Secondary | ICD-10-CM | POA: Diagnosis not present

## 2019-11-28 DIAGNOSIS — G9349 Other encephalopathy: Secondary | ICD-10-CM | POA: Diagnosis present

## 2019-11-28 DIAGNOSIS — I69398 Other sequelae of cerebral infarction: Secondary | ICD-10-CM

## 2019-11-28 DIAGNOSIS — I5032 Chronic diastolic (congestive) heart failure: Secondary | ICD-10-CM | POA: Diagnosis present

## 2019-11-28 DIAGNOSIS — E119 Type 2 diabetes mellitus without complications: Secondary | ICD-10-CM

## 2019-11-28 LAB — COMPREHENSIVE METABOLIC PANEL
ALT: 10 U/L (ref 0–44)
AST: 15 U/L (ref 15–41)
Albumin: 3.9 g/dL (ref 3.5–5.0)
Alkaline Phosphatase: 76 U/L (ref 38–126)
Anion gap: 13 (ref 5–15)
BUN: 28 mg/dL — ABNORMAL HIGH (ref 8–23)
CO2: 22 mmol/L (ref 22–32)
Calcium: 9.4 mg/dL (ref 8.9–10.3)
Chloride: 96 mmol/L — ABNORMAL LOW (ref 98–111)
Creatinine, Ser: 1.3 mg/dL — ABNORMAL HIGH (ref 0.44–1.00)
GFR calc Af Amer: 46 mL/min — ABNORMAL LOW (ref >60)
GFR calc non Af Amer: 40 mL/min — ABNORMAL LOW (ref >60)
Glucose, Bld: 202 mg/dL — ABNORMAL HIGH (ref 70–99)
Potassium: 5 mmol/L (ref 3.5–5.1)
Sodium: 131 mmol/L — ABNORMAL LOW (ref 135–145)
Total Bilirubin: 0.9 mg/dL (ref 0.3–1.2)
Total Protein: 7.2 g/dL (ref 6.5–8.1)

## 2019-11-28 LAB — I-STAT CHEM 8, ED
BUN: 28 mg/dL — ABNORMAL HIGH (ref 8–23)
Calcium, Ion: 1.14 mmol/L — ABNORMAL LOW (ref 1.15–1.40)
Chloride: 98 mmol/L (ref 98–111)
Creatinine, Ser: 1.2 mg/dL — ABNORMAL HIGH (ref 0.44–1.00)
Glucose, Bld: 200 mg/dL — ABNORMAL HIGH (ref 70–99)
HCT: 41 % (ref 36.0–46.0)
Hemoglobin: 13.9 g/dL (ref 12.0–15.0)
Potassium: 5.1 mmol/L (ref 3.5–5.1)
Sodium: 132 mmol/L — ABNORMAL LOW (ref 135–145)
TCO2: 25 mmol/L (ref 22–32)

## 2019-11-28 LAB — RAPID URINE DRUG SCREEN, HOSP PERFORMED
Amphetamines: NOT DETECTED
Barbiturates: NOT DETECTED
Benzodiazepines: NOT DETECTED
Cocaine: NOT DETECTED
Opiates: NOT DETECTED
Tetrahydrocannabinol: NOT DETECTED

## 2019-11-28 LAB — CBG MONITORING, ED: Glucose-Capillary: 192 mg/dL — ABNORMAL HIGH (ref 70–99)

## 2019-11-28 LAB — PROTIME-INR
INR: 1.3 — ABNORMAL HIGH (ref 0.8–1.2)
Prothrombin Time: 16.4 seconds — ABNORMAL HIGH (ref 11.4–15.2)

## 2019-11-28 LAB — CBC
HCT: 41.3 % (ref 36.0–46.0)
Hemoglobin: 13.2 g/dL (ref 12.0–15.0)
MCH: 28.9 pg (ref 26.0–34.0)
MCHC: 32 g/dL (ref 30.0–36.0)
MCV: 90.4 fL (ref 80.0–100.0)
Platelets: 230 10*3/uL (ref 150–400)
RBC: 4.57 MIL/uL (ref 3.87–5.11)
RDW: 12.9 % (ref 11.5–15.5)
WBC: 9.4 10*3/uL (ref 4.0–10.5)
nRBC: 0 % (ref 0.0–0.2)

## 2019-11-28 LAB — URINALYSIS, ROUTINE W REFLEX MICROSCOPIC
Bilirubin Urine: NEGATIVE
Glucose, UA: 50 mg/dL — AB
Hgb urine dipstick: NEGATIVE
Ketones, ur: 5 mg/dL — AB
Leukocytes,Ua: NEGATIVE
Nitrite: NEGATIVE
Protein, ur: NEGATIVE mg/dL
Specific Gravity, Urine: 1.018 (ref 1.005–1.030)
pH: 6 (ref 5.0–8.0)

## 2019-11-28 LAB — DIFFERENTIAL
Abs Immature Granulocytes: 0.03 10*3/uL (ref 0.00–0.07)
Basophils Absolute: 0 10*3/uL (ref 0.0–0.1)
Basophils Relative: 0 %
Eosinophils Absolute: 0 10*3/uL (ref 0.0–0.5)
Eosinophils Relative: 0 %
Immature Granulocytes: 0 %
Lymphocytes Relative: 11 %
Lymphs Abs: 1.1 10*3/uL (ref 0.7–4.0)
Monocytes Absolute: 0.5 10*3/uL (ref 0.1–1.0)
Monocytes Relative: 5 %
Neutro Abs: 7.8 10*3/uL — ABNORMAL HIGH (ref 1.7–7.7)
Neutrophils Relative %: 84 %

## 2019-11-28 LAB — ETHANOL: Alcohol, Ethyl (B): <10 mg/dL (ref <10)

## 2019-11-28 LAB — POC SARS CORONAVIRUS 2 AG -  ED: SARS Coronavirus 2 Ag: NEGATIVE

## 2019-11-28 LAB — APTT: aPTT: 30 seconds (ref 24–36)

## 2019-11-28 MED ORDER — PANTOPRAZOLE SODIUM 40 MG PO TBEC
40.0000 mg | DELAYED_RELEASE_TABLET | Freq: Every day | ORAL | Status: DC
  Administered 2019-11-28 – 2019-12-04 (×7): 40 mg via ORAL
  Filled 2019-11-28 (×7): qty 1

## 2019-11-28 MED ORDER — POTASSIUM CHLORIDE ER 10 MEQ PO TBCR: 20.0000 meq | EXTENDED_RELEASE_TABLET | Freq: Every day | ORAL | Status: DC

## 2019-11-28 MED ORDER — METOPROLOL TARTRATE 25 MG PO TABS
12.5000 mg | ORAL_TABLET | Freq: Two times a day (BID) | ORAL | Status: DC
  Administered 2019-11-28 – 2019-12-04 (×12): 12.5 mg via ORAL
  Filled 2019-11-28 (×11): qty 1

## 2019-11-28 MED ORDER — SODIUM CHLORIDE 0.9 % IV SOLN: 250.0000 mL | INTRAVENOUS | Status: DC | PRN

## 2019-11-28 MED ORDER — ONDANSETRON HCL 4 MG PO TABS: 4.0000 mg | ORAL_TABLET | Freq: Four times a day (QID) | ORAL | Status: DC | PRN

## 2019-11-28 MED ORDER — SODIUM CHLORIDE 0.45 % IV SOLN: INTRAVENOUS | Status: AC

## 2019-11-28 MED ORDER — INSULIN ASPART 100 UNIT/ML ~~LOC~~ SOLN
0.0000 [IU] | Freq: Three times a day (TID) | SUBCUTANEOUS | Status: DC
  Administered 2019-11-29 – 2019-11-30 (×4): 1 [IU] via SUBCUTANEOUS
  Administered 2019-12-01: 2 [IU] via SUBCUTANEOUS
  Administered 2019-12-01: 1 [IU] via SUBCUTANEOUS
  Filled 2019-11-28 (×2): qty 1

## 2019-11-28 MED ORDER — SODIUM CHLORIDE 0.9% FLUSH
3.0000 mL | Freq: Two times a day (BID) | INTRAVENOUS | Status: DC
  Administered 2019-11-28 – 2019-12-04 (×11): 3 mL via INTRAVENOUS

## 2019-11-28 MED ORDER — DOCUSATE SODIUM 100 MG PO CAPS
100.0000 mg | ORAL_CAPSULE | Freq: Two times a day (BID) | ORAL | Status: DC
  Administered 2019-11-28 – 2019-12-04 (×11): 100 mg via ORAL
  Filled 2019-11-28 (×11): qty 1

## 2019-11-28 MED ORDER — DILTIAZEM HCL ER COATED BEADS 120 MG PO CP24
120.0000 mg | ORAL_CAPSULE | Freq: Every day | ORAL | Status: DC
  Administered 2019-11-29 – 2019-12-04 (×6): 120 mg via ORAL
  Filled 2019-11-28 (×11): qty 1

## 2019-11-28 MED ORDER — ACETAMINOPHEN 325 MG PO TABS: 650.0000 mg | ORAL_TABLET | Freq: Four times a day (QID) | ORAL | Status: DC | PRN

## 2019-11-28 MED ORDER — LISINOPRIL 10 MG PO TABS
20.0000 mg | ORAL_TABLET | Freq: Every day | ORAL | Status: DC
  Administered 2019-11-29 – 2019-12-04 (×6): 20 mg via ORAL
  Filled 2019-11-28 (×6): qty 2

## 2019-11-28 MED ORDER — APIXABAN 5 MG PO TABS
5.0000 mg | ORAL_TABLET | Freq: Two times a day (BID) | ORAL | Status: DC
  Administered 2019-11-28 – 2019-12-04 (×12): 5 mg via ORAL
  Filled 2019-11-28 (×12): qty 1

## 2019-11-28 MED ORDER — LISINOPRIL-HYDROCHLOROTHIAZIDE 20-12.5 MG PO TABS: 0.5000 | ORAL_TABLET | Freq: Every day | ORAL | Status: DC

## 2019-11-28 MED ORDER — METFORMIN HCL 500 MG PO TABS
500.0000 mg | ORAL_TABLET | Freq: Two times a day (BID) | ORAL | Status: DC
  Administered 2019-11-29 – 2019-12-04 (×10): 500 mg via ORAL
  Filled 2019-11-28 (×10): qty 1

## 2019-11-28 MED ORDER — PRO-STAT SUGAR FREE PO LIQD
30.0000 mL | Freq: Two times a day (BID) | ORAL | Status: DC
  Administered 2019-11-29 – 2019-12-04 (×8): 30 mL via ORAL
  Filled 2019-11-28 (×16): qty 30

## 2019-11-28 MED ORDER — HYDROCHLOROTHIAZIDE 25 MG PO TABS
12.5000 mg | ORAL_TABLET | Freq: Every day | ORAL | Status: DC
  Filled 2019-11-28: qty 1
  Filled 2019-11-28: qty 0.5

## 2019-11-28 MED ORDER — SODIUM CHLORIDE 0.9% FLUSH: 3.0000 mL | INTRAVENOUS | Status: DC | PRN

## 2019-11-28 MED ORDER — ATORVASTATIN CALCIUM 20 MG PO TABS
20.0000 mg | ORAL_TABLET | Freq: Every day | ORAL | Status: DC
  Administered 2019-11-28 – 2019-12-03 (×6): 20 mg via ORAL
  Filled 2019-11-28 (×4): qty 1
  Filled 2019-11-28: qty 2
  Filled 2019-11-28: qty 1

## 2019-11-28 MED ORDER — MAGNESIUM OXIDE 400 (241.3 MG) MG PO TABS
400.0000 mg | ORAL_TABLET | Freq: Every day | ORAL | Status: DC
  Administered 2019-11-29 – 2019-12-04 (×6): 400 mg via ORAL
  Filled 2019-11-28 (×8): qty 1

## 2019-11-28 NOTE — ED Provider Notes (Signed)
[facility] EMERGENCY DEPARTMENT Provider Note   CSN: [id number] Arrival date & time: 11/28/19  1905     History No chief complaint on file.   GLENDOLA FRIEDHOFF is a 78 y.o. female.  HPI     Patient is a 78 year old female, history is obtained primarily from the paramedics stating that they were called out because of weakness.  The patient spouse stated that the patient was having some difficulty ambulating, appeared to be weak, appeared to be acute in onset.  When they arrived they noticed that the patient was intermittently having facial droop but able to follow commands, generally weak appearing.  There has been no apparent fevers, no vomiting or diarrhea, no coughing or shortness of breath.  She was here 4 days ago with foot pain.  She had a blister on the top of her foot on her right leg.  The patient is known to be a diabetic, she has high cholesterol, she has hypertension, she has atrial fibrillation and takes Eliquis, Cardizem and other antihypertensives.  She is on Metformin for her diabetes.  The patient is having some difficulty communicating with me verbally   Past Medical History:  Diagnosis Date  . Diabetes (HCC)    Type 2 x 10 yrs  . Dysrhythmia    AFib  . GERD (gastroesophageal reflux disease)   . Grade I diastolic dysfunction 09/03/2018  . High cholesterol   . Hypertension    x 10 yrs  . Paroxysmal a-fib: Remote hx of afib 06/15/2015  . Stroke [facility])    no deficits    Patient Active Problem List   Diagnosis Date Noted  . Atrial fibrillation with RVR (HCC)   . Hypokalemia   . Right arm weakness   . TIA (transient ischemic attack) 11/07/2019  . 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 Grade I diastolic dysfunction 09/03/2018  . Hypomagnesemia 09/03/2018  . Paroxysmal atrial fibrillation (HCC) 08/31/2015  . Chronic anticoagulation 08/31/2015  . Type 2 diabetes mellitus with circulatory disorder (HCC) 08/31/2015  .  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 Cerebral infarction due to embolism of left middle cerebral artery (HCC)   . Stroke-like symptoms 06/13/2015  . HLD (hyperlipidemia) 06/13/2015  . DDD (degenerative disc disease), cervical 06/13/2015  . Aphasia 06/13/2015  . Expressive aphasia   . Cerebral infarction due to unspecified mechanism   . Type 2 diabetes mellitus without complication (HCC)   . IBS (irritable bowel syndrome) 04/24/2013  . Diabetes (HCC) 04/24/2013  . Essential hypertension, benign 04/24/2013    Past Surgical History:  Procedure Laterality Date  . CATARACT EXTRACTION W/PHACO Left 06/06/2016   Procedure: CATARACT EXTRACTION PHACO AND INTRAOCULAR LENS PLACEMENT (IOC);  Surgeon: Gemma Payor, MD;  Location: [facility] ORS;  Service: Ophthalmology;  Laterality: Left;  CDE: 8.32  . COLONOSCOPY    . COLONOSCOPY N/A 05/08/2013   Procedure: COLONOSCOPY;  Surgeon: Malissa Hippo, MD;  Location: [facility] ENDO SUITE;  Service: Endoscopy;  Laterality: N/A;  1015  . NECK SURGERY     x 2 for spur and disc problems     OB History   No obstetric history on file.     Family History  Problem Relation Age of Onset  . Diabetes Father     Social History   Tobacco Use  . Smoking status: Never Smoker  . Smokeless tobacco: Never Used  Substance Use Topics  . Alcohol use: No  Alcohol/week: 0.0 standard drinks  . Drug use: No    Home Medications Prior to Admission medications   Medication Sig Start Date End Date Taking? Authorizing Provider  acetaminophen (TYLENOL) 325 MG tablet Take 650 mg by mouth every 6 (six) hours as needed.    [provider]  Amino Acids-Protein Hydrolys (FEEDING SUPPLEMENT, PRO-STAT SUGAR FREE 64,) LIQD Take 30 mLs by mouth 2 (two) times daily between meals.    [provider]  apixaban (ELIQUIS) 5 MG TABS tablet Take 1 tablet (5 mg total) by mouth 2 (two) times daily. 06/26/15   Angiulli, Lavon Paganini, PA-C  atorvastatin (LIPITOR) 20 MG  tablet Take 1 tablet (20 mg total) by mouth at bedtime. 06/26/15   Angiulli, Lavon Paganini, PA-C  diltiazem (CARDIZEM CD) 120 MG 24 hr capsule Take 1 capsule (120 mg total) by mouth daily. 01/21/18   Milton Ferguson, MD  doxycycline (VIBRAMYCIN) 100 MG capsule Take 1 capsule (100 mg total) by mouth 2 (two) times daily. 11/18/19   Triplett, Tammy, PA-C  lisinopril-hydrochlorothiazide (ZESTORETIC) 20-12.5 MG tablet Take 0.5 tablets by mouth daily. 11/08/19   Barton Dubois, MD  magnesium oxide (MAG-OX) 400 MG tablet Take 1 tablet (400 mg total) by mouth daily. 11/08/19   Barton Dubois, MD  metFORMIN (GLUCOPHAGE) 1000 MG tablet Take 500 mg by mouth 2 (two) times daily with a meal.    [provider]  metoprolol tartrate (LOPRESSOR) 25 MG tablet Take 12.5 mg by mouth 2 (two) times daily.    [provider]  ondansetron (ZOFRAN) 4 MG tablet Take 4 mg by mouth every 6 (six) hours as needed for nausea or vomiting.    [provider]  pantoprazole (PROTONIX) 40 MG tablet Take 40 mg by mouth daily.    [provider]  potassium chloride (KLOR-CON) 10 MEQ tablet Take 2 tablets (20 mEq total) by mouth daily. 11/08/19   Barton Dubois, MD    Allergies    Patient has no known allergies.  Review of Systems   Review of Systems  Unable to perform ROS: Acuity of condition    Physical Exam Updated Vital Signs There were no vitals taken for this visit.  Physical Exam Vitals and nursing note reviewed.  Constitutional:      General: She is not in acute distress.    Appearance: She is well-developed.  HENT:     Head: Normocephalic and atraumatic.     Mouth/Throat:     Pharynx: No oropharyngeal exudate.  Eyes:     General: No scleral icterus.       Right eye: No discharge.        Left eye: No discharge.     Conjunctiva/sclera: Conjunctivae normal.     Pupils: Pupils are equal, round, and reactive to light.  Neck:     Thyroid: No thyromegaly.     Vascular: No JVD.    Cardiovascular:     Rate and Rhythm: Tachycardia present. Rhythm irregular.     Heart sounds: Normal heart sounds. No murmur. No friction rub. No gallop.   Pulmonary:     Effort: Pulmonary effort is normal. No respiratory distress.     Breath sounds: Normal breath sounds. No wheezing or rales.  Abdominal:     General: Bowel sounds are normal. There is no distension.     Palpations: Abdomen is soft. There is no mass.     Tenderness: There is no abdominal tenderness.  Musculoskeletal:  General: No tenderness. Normal range of motion.     Cervical back: Normal range of motion and neck supple.  Lymphadenopathy:     Cervical: No cervical adenopathy.  Skin:    General: Skin is warm and dry.     Findings: No erythema or rash.  Neurological:     Mental Status: She is alert.     Coordination: Coordination normal.     Comments: Subtle facial droop, the patient has a slight pronator drift in her upper extremities but normal grips and normal finger-nose-finger.  Her speech is somewhat slow to respond but able to answer questions.  She can lift both legs off the bed with equal strength.  She is generally weak but able to follow commands.  Seems to overcome the facial droop when she smiles.  Peripheral visual fields are normal, cranial nerves III through XII are normal  Psychiatric:        Behavior: Behavior normal.     ED Results / Procedures / Treatments   Labs (all labs ordered are listed, but only abnormal results are displayed) Labs Reviewed  ETHANOL  PROTIME-INR  APTT  CBC  DIFFERENTIAL  COMPREHENSIVE METABOLIC PANEL  RAPID URINE DRUG SCREEN, HOSP PERFORMED  URINALYSIS, ROUTINE W REFLEX MICROSCOPIC  I-STAT CHEM 8, ED    EKG ED ECG REPORT  I personally interpreted this EKG   Date: 11/28/2019   Rate: 96  Rhythm: atrial fibrillation  QRS Axis: left  Intervals: normal  ST/T Wave abnormalities: nonspecific ST/T changes  Conduction Disutrbances:none  Narrative  Interpretation:   Old EKG Reviewed: unchanged   Radiology CT HEAD CODE STROKE WO CONTRAST  Result Date: 11/28/2019 CLINICAL DATA:  Code stroke. Acute presentation with weakness in the legs and confusion. EXAM: CT HEAD WITHOUT CONTRAST TECHNIQUE: Contiguous axial images were obtained from the base of the skull through the vertex without intravenous contrast. COMPARISON:  MRI 11/07/2019 FINDINGS: Brain: Generalized atrophy. 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 Chronic small-vessel ischemic changes of the white matter. Old small left posterior frontal cortical infarction. No sign of acute infarction, mass lesion, hemorrhage, hydrocephalus or extra-axial collection. Vascular: There is atherosclerotic calcification of the major vessels at the base of the brain. Skull: Negative Sinuses/Orbits: Chronic opacification of the left division the sphenoid sinus. Other sinuses clear orbits negative Other: None ASPECTS (Alberta Stroke Program Early CT Score) - Ganglionic level infarction (caudate, lentiform nuclei, internal capsule, insula, M1-M3 cortex): 7 - Supraganglionic infarction (M4-M6 cortex): 3 Total score (0-10 with 10 being normal): 10 IMPRESSION: 1. No acute finding by CT. Old infarctions in the left MCA territory affecting the basal ganglia, temporal lobe and posterior frontal lobe. 2. ASPECTS is 10, allowing for the chronic findings. 3. These results were called by telephone at the time of interpretation on 11/28/2019 at 7:26 pm to provider [facility] , who verbally acknowledged these results. Electronically Signed   By: Paulina Fusi M.D.   On: 11/28/2019 19:27    Procedures Procedures (including critical care time)  Medications Ordered in ED Medications - No data to display  ED Course  I have reviewed the triage vital signs and the nursing notes.  Pertinent labs & imaging results that were available during my care of the patient were reviewed  by me and considered in my medical decision making (see chart for details).    MDM Rules/Calculators/A&P  The patient seems to have abrupt onset weakness as well as some difficulty with speech though it is vague and may be some intermittent facial droop.  We will activate a code stroke and she will go straight to CT scan.  The patient was seen on arrival at approximately 7:05 PM by myself.  Code stroke activated.  I discussed the care with the teleneurologist who agrees that the patient should be admitted for further work-up to rule out stroke however he thinks this may be more metabolic or encephalopathic.  She appears generally weak without focal findings to suggest an acute stroke.  Discussed with the radiologist who also agrees that there is no findings for acute stroke though there is some signs of prior strokes.  Neuro recommends admit to stroke / tele floor with neuro checks, continue eliquis  Paged hospitalist for admission.  D/w Dr. Arthur HolmsNorrins - will admit  Lance BoschCatherine P Dickenson was evaluated in Emergency Department on 11/28/2019 for the symptoms described in the history of present illness. She was evaluated in the context of the global COVID-19 pandemic, which necessitated consideration that the patient might be at risk for infection with the SARS-CoV-2 virus that causes COVID-19. Institutional protocols and algorithms that pertain to the evaluation of patients at risk for COVID-19 are in a state of rapid change based on information released by regulatory bodies including the CDC and federal and state organizations. These policies and algorithms were followed during the patient's care in the ED.   Final Clinical Impression(s) / ED Diagnoses Final diagnoses:  Acute right-sided weakness      Eber HongMiller, Gaynel Schaafsma, MD 11/28/19 2121

## 2019-11-28 NOTE — Progress Notes (Signed)
CODE STROKE TIME DOCUMENTATION CALL TIME = 1858 BEEPER TIME 1903 EXAM STARTED 1906 EXAM FINISHED 1908 IMAGES SENT TO [place] CALLED 1910

## 2019-11-28 NOTE — H&P (Signed)
History and Physical    HONESTY MENTA AJG:[id number] DOB: [birth year] DOA: 11/28/2019  PCP: Lemmie Evens, MD (Confirm with patient/family/NH records and if not entered, this has to be entered at [facility] point of entry) Patient coming from: coming from home  I have personally briefly reviewed patient's old medical records in [place]  Chief Complaint: New onset generalize weakness  HPI: Holly Sparks is a 78 y.o. 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 She has DM, HTN HLD, and a. Fib. She was last admitted 11/07/19 for right UE weakness with complete resolution of symptoms in the ED. Her eval include Carotid dopplers, 2 D echo and MRI brain. She was in her usual state of declining health and decreasing mobility until today when by her husband's report she had increased weakness, possibly favoring the right and he had great difficulty in helping her mobilize. EMS was called and she was brought to [facility] ED for evaluation. She denies pain, focal weakness or other major changes that she can recall.  (For level 3, the HPI must include 4+ descriptors: Location, Quality, Severity, Duration, Timing, Context, modifying factors, associated signs/symptoms and/or status of 3+ chronic problems.)  (Please avoid self-populating past medical history here) (The initial 2-3 lines should be focused and good to copy and paste in the HPI section of the daily progress note).  ED Course: Hemodynamically stable in the ED. CT head w/o hemorrhage or acute injury. Labs unremarkable. Tele-neuro consult w/o distinct findings but recommended admission for TIA w/u. Evidently Dec 10th admission was not reviewed. TRH is called to admit the patient for question of stroke like symptoms and decreased mobility.  Review of Systems: As per HPI otherwise 10 point review of systems negative.    Past Medical History:  Diagnosis Date  . Diabetes ([place])    Type 2 x 10 yrs  . Dysrhythmia    AFib  . GERD (gastroesophageal reflux disease)   . Grade I diastolic dysfunction 35/03/9740  . High cholesterol   . Hypertension    x 10 yrs  . Paroxysmal a-fib: Remote hx of afib 06/15/2015  . Stroke [facility])    no deficits    Past Surgical History:  Procedure Laterality Date  . CATARACT EXTRACTION W/PHACO Left 06/06/2016   Procedure: CATARACT EXTRACTION PHACO AND INTRAOCULAR LENS PLACEMENT (IOC);  Surgeon: Tonny Branch, MD;  Location: [facility] ORS;  Service: Ophthalmology;  Laterality: Left;  CDE: 8.32  . COLONOSCOPY    . COLONOSCOPY N/A 05/08/2013   Procedure: COLONOSCOPY;  Surgeon: Rogene Houston, MD;  Location: [facility] ENDO SUITE;  Service: Endoscopy;  Laterality: N/A;  1015  . NECK SURGERY     x 2 for spur and disc problems   SocHx - married > 50 years. She and her 23 y/o husband live alone. She has two children and two grandchildren. They are both retired. There is limited help coming to assist with cleaning and house-keeping. She does no housework, cooking or other activities.    reports that she has never smoked. She has never used smokeless tobacco. She reports that she does not drink alcohol or use drugs.  No Known Allergies  Family History  Problem Relation Age of Onset  . Diabetes Father    Unacceptable: Noncontributory, unremarkable, or negative. Acceptable: Family history reviewed and not pertinent (If you reviewed it)  Prior to Admission medications   Medication Sig Start Date End Date Taking? Authorizing  Provider  acetaminophen (TYLENOL) 325 MG tablet Take 650 mg by mouth every 6 (six) hours as needed.   Yes [provider]  apixaban (ELIQUIS) 5 MG TABS tablet Take 1 tablet (5 mg total) by mouth 2 (two) times daily. 06/26/15  Yes Angiulli, Mcarthur Rossetti, PA-C  atorvastatin (LIPITOR) 20 MG tablet Take 1 tablet (20 mg total) by mouth at bedtime. 06/26/15  Yes Angiulli, Mcarthur Rossetti, PA-C  lisinopril-hydrochlorothiazide (ZESTORETIC) 20-12.5 MG  tablet Take 0.5 tablets by mouth daily. 11/08/19  Yes Vassie Loll, MD  magnesium oxide (MAG-OX) 400 MG tablet Take 1 tablet (400 mg total) by mouth daily. 11/08/19  Yes Vassie Loll, MD  metFORMIN (GLUCOPHAGE) 1000 MG tablet Take 500 mg by mouth 2 (two) times daily with a meal.   Yes [provider]  metoprolol tartrate (LOPRESSOR) 25 MG tablet Take 12.5 mg by mouth 2 (two) times daily.   Yes [provider]  ondansetron (ZOFRAN) 4 MG tablet Take 4 mg by mouth every 6 (six) hours as needed for nausea or vomiting.   Yes [provider]  pantoprazole (PROTONIX) 40 MG tablet Take 40 mg by mouth daily.   Yes [provider]  potassium chloride (KLOR-CON) 10 MEQ tablet Take 2 tablets (20 mEq total) by mouth daily. 11/08/19  Yes Vassie Loll, MD  Amino Acids-Protein Hydrolys (FEEDING SUPPLEMENT, PRO-STAT SUGAR FREE 64,) LIQD Take 30 mLs by mouth 2 (two) times daily between meals.    [provider]  diltiazem (CARDIZEM CD) 120 MG 24 hr capsule Take 1 capsule (120 mg total) by mouth daily. 01/21/18   Bethann Berkshire, MD    Physical Exam: Vitals:   11/28/19 1928 11/28/19 1930 11/28/19 2000 11/28/19 2030  BP:  133/81 (!) 146/82 132/67  Pulse:  (!) 105 (!) 116 (!) 102  Resp:  (!) Temp:      TempSrc:      SpO2:  100% 100% 97%  Weight: 58.1 kg     Height:  (1.702 m)       Constitutional: NAD, calm, comfortable Vitals:   11/28/19 1928 11/28/19 1930 11/28/19 2000 11/28/19 2030  BP:  133/81 (!) 146/82 132/67  Pulse:  (!) 105 (!) 116 (!) 102  Resp:  (!) Temp:      TempSrc:      SpO2:  100% 100% 97%  Weight: 58.1 kg     Height:  (1.702 m)      General appearance:  Frail, elderly woman in no distress. Conversant but slow of speech Eyes: PERRL, lids and conjunctivae normal with mild arcus senilis ENMT: Mucous membranes are moist.  Neck: normal, supple, no masses, no thyromegaly Respiratory: clear to auscultation  bilaterally, no wheezing, no crackles. Normal respiratory effort. No accessory muscle use.  Cardiovascular: IRIR at controlled rate, no murmurs / rubs / gallops. No extremity edema. 2+ pedal pulses. No carotid bruits.  Abdomen: no tenderness, no masses palpated. No hepatosplenomegaly. Bowel sounds positive.  Musculoskeletal: no clubbing / cyanosis. No joint deformity upper and lower extremities. Good ROM, no contractures. poor muscle tone. Mild right foot drop Skin: no rashes,  Recent scar on dorsum right foot from recent bullous lesion Neurologic: CN 2-12: PERRLA, EOMI, normal facial symmetry, no deviation of tongue. MS - 4/5 bilateral grip strength. Able to lift legs off the bed, dorsoflex feet. Marked truncal regidity. Did not attempt to stand or ambulate Psychiatric: Normal judgment and poor insight to her condition.  Alert and oriented x 3. Normal mood.   (Anything < 9 systems with 2 bullets each down codes to level 1) (If patient refuses exam can't bill higher level) (Make sure to document decubitus ulcers present on admission -- if possible -- and whether patient has chronic indwelling catheter at time of admission)  Labs on Admission: I have personally reviewed following labs and imaging studies  CBC: Recent Labs  Lab 11/28/19 1926 11/28/19 1935  WBC 9.4  --   NEUTROABS 7.8*  --   HGB 13.2 13.9  HCT 41.3 41.0  MCV 90.4  --   PLT 230  --    Basic Metabolic Panel: Recent Labs  Lab 11/28/19 1926 11/28/19 1935  NA 131* 132*  K 5.0 5.1  CL 96* 98  CO2 22  --   GLUCOSE 202* 200*  BUN 28* 28*  CREATININE 1.30* 1.20*  CALCIUM 9.4  --    GFR: Estimated Creatinine Clearance: 36 mL/min (A) (by C-G formula based on SCr of 1.2 mg/dL (H)). Liver Function Tests: Recent Labs  Lab 11/28/19 1926  AST 15  ALT 10  ALKPHOS 76  BILITOT 0.9  PROT 7.2  ALBUMIN 3.9   No results for input(s): LIPASE, AMYLASE in the last 168 hours. No results for input(s): AMMONIA in the last 168  hours. Coagulation Profile: Recent Labs  Lab 11/28/19 1926  INR 1.3*   Cardiac Enzymes: No results for input(s): CKTOTAL, CKMB, CKMBINDEX, TROPONINI in the last 168 hours. BNP (last 3 results) No results for input(s): PROBNP in the last 8760 hours. HbA1C: No results for input(s): HGBA1C in the last 72 hours. CBG: Recent Labs  Lab 11/28/19 1921  GLUCAP 192*   Lipid Profile: No results for input(s): CHOL, HDL, LDLCALC, TRIG, CHOLHDL, LDLDIRECT in the last 72 hours. Thyroid Function Tests: No results for input(s): TSH, T4TOTAL, FREET4, T3FREE, THYROIDAB in the last 72 hours. Anemia Panel: No results for input(s): VITAMINB12, FOLATE, FERRITIN, TIBC, IRON, RETICCTPCT in the last 72 hours. Urine analysis:    Component Value Date/Time   COLORURINE YELLOW 11/28/2019 1955   APPEARANCEUR CLEAR 11/28/2019 1955   LABSPEC 1.018 11/28/2019 1955   PHURINE 6.0 11/28/2019 1955   GLUCOSEU 50 (A) 11/28/2019 1955   HGBUR NEGATIVE 11/28/2019 1955   BILIRUBINUR NEGATIVE 11/28/2019 1955   KETONESUR 5 (A) 11/28/2019 1955   PROTEINUR NEGATIVE 11/28/2019 1955   UROBILINOGEN 0.2 06/13/2015 1141   NITRITE NEGATIVE 11/28/2019 1955   LEUKOCYTESUR NEGATIVE 11/28/2019 1955    Radiological Exams on Admission: CT HEAD CODE STROKE WO CONTRAST  Result Date: 11/28/2019 CLINICAL DATA:  Code stroke. Acute presentation with weakness in the legs and confusion. EXAM: CT HEAD WITHOUT CONTRAST TECHNIQUE: Contiguous axial images were obtained from the base of the skull through the vertex without intravenous contrast. COMPARISON:  MRI 11/07/2019 FINDINGS: Brain: Generalized atrophy. 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 Chronic small-vessel ischemic changes of the white matter. Old small left posterior frontal cortical infarction. No sign of acute infarction, mass lesion, hemorrhage, hydrocephalus or extra-axial collection. Vascular: There is atherosclerotic  calcification of the major vessels at the base of the brain. Skull: Negative Sinuses/Orbits: Chronic opacification of the left division the sphenoid sinus. Other sinuses clear orbits negative Other: None ASPECTS (Alberta Stroke Program Early CT Score) - Ganglionic level infarction (caudate, lentiform nuclei, internal capsule, insula, M1-M3 cortex): 7 - Supraganglionic infarction (M4-M6 cortex): 3 Total score (0-10 with 10 being normal): 10 IMPRESSION: 1.  No acute finding by CT. Old infarctions in the left MCA territory affecting the basal ganglia, temporal lobe and posterior frontal lobe. 2. ASPECTS is 10, allowing for the chronic findings. 3. These results were called by telephone at the time of interpretation on 11/28/2019 at 7:26 pm to [facility] , who verbally acknowledged these results. Electronically Signed   By: Paulina FusiMark  Shogry M.D.   On: 11/28/2019 19:27    EKG: Independently reviewed. A. Fib with Left fasicular block  Assessment/Plan Active Problems:   Adult failure to thrive   Diabetes (HCC)   Essential hypertension, benign   Stroke-like symptoms   HLD (hyperlipidemia)   Paroxysmal atrial fibrillation (HCC)  (please populate well all problems here in Problem List. (For example, if patient is on BP meds at home and you resume or decide to hold them, it is a problem that needs to be her. Same for CAD, COPD, HLD and so on)   1. Stroke-like symptoms - generalized weakness favoring right side with no focal weakness on exam. Patient had full neuro workup 11/07/2019. Is on Eliquis. She is stiff on exam and a risk for ambulation. Plan No indication to repeat stroke work up  PT/OT eval to help determine disposition - likely SNF for skilled rehab  Contineu Eliquis  2. DM - will continue home meds. Last A1C 11/08/19 was 7.5%  3. HLD - continue home meds  4. PAF - stable, on eliquis  5. Prerenal azotemia - patient with increase in creatinine and BUN. Suspect poor po  intake Plan Gentle hydration with 1/2 NS  6. Adult Failure to thrive - progressive weakness and increasing need for assistance with ADLs beyond what her husband can continue to provide in her current condition. Plan PT/OT eval  Social Work consult for SNF  DVT prophylaxis: on Eliquis (Lovenox/Heparin/SCD's/anticoagulated/None (if comfort care) Code Status: DNR (Full/Partial (specify details) Family Communication: spoke with spouse (Specify name, relationship. Do not write "discussed with patient". Specify tel # if discussed over the phone) Disposition Plan: to be determined (specify when and where you expect patient to be discharged) Consults called: none (with names) Admission status: in-patient (inpatient / obs / tele / medical floor / SDU)   Holly RegulusMichael Janos Shampine MD [facility] Pager [phone]  If 7PM-7AM, please contact night-coverage [email or web address] Password [email or web address]  11/28/2019, 10:00 PM

## 2019-11-28 NOTE — ED Triage Notes (Signed)
Patient is from home with husband who reports that lethargy all day. Last know well was approximately 1800. Husband reports her legs gave out when trying to stand.

## 2019-11-28 NOTE — Consult Note (Signed)
[facility] Tele[facility]urology Consult Services   Date of Service:   11/28/2019 19:15:17  Impression:     .  I63.9 - Cerebrovascular accident (CVA), unspecified mechanism ([place])  Comments/Sign-Out: Patients clinical features can be compatible with diagnosis of acute ischemic stroke however other vascular and non-vascular conditions that present with an acute neurological deficit simulating acute ischemic stroke is possible. Chief differential include: toxic-metabolic disturbances  Metrics: Last Known Well: 11/28/2019 18:00:17 [facility] Notification Time: 11/28/2019 19:15:17 Arrival Time: 11/28/2019 19:05:17 Stamp Time: 11/28/2019 19:15:17 Time First Login Attempt: 11/28/2019 19:18:46 Symptoms: generalized weakness NIHSS Start Assessment Time: 11/28/2019 19:27:14 Patient is not a candidate for Alteplase/Activase. Patient was not deemed candidate for Alteplase/Activase thrombolytics because of Use of NOAs within 48 hours.  CT head showed no acute hemorrhage or acute core infarct.  Clinical Presentation is not Suggestive of Large Vessel Occlusive Disease  Radiologist was not called back for review of advanced imaging because NA ED Physician notified of diagnostic impression and management plan on 11/28/2019 19:39:46  Our recommendations are outlined below.  Recommendations:     .  Activate Stroke Protocol Admission/Order Set     .  Stroke/Telemetry Floor     .  Neuro Checks     .  Bedside Swallow Eval     .  DVT Prophylaxis     .  IV Fluids, Normal Saline     .  Head of Bed 30 Degrees     .  Euglycemia and Avoid Hyperthermia (PRN Acetaminophen)     .  cont Eliquis  Routine Consultation with [facility] for Follow up Care  Sign Out:     .  Discussed with Emergency Department Provider    ------------------------------------------------------------------------------  History of Present Illness: Patient is a 78 year old Female.  Patient was  brought by EMS for symptoms of generalized weakness  Patient seen in ED Arrival time:19:05 EMS Information obtained from nursing staff h/o past medical history significant for hypertension, diabetes type 2, atrial fibrillation (chronically on Eliquis), chronic diastolic heart failure, hyperlipidemia Chronology: LKW 6:00 pm apparently pt developed weakness in legs and confusion During stroke ebvaluation patient was found to be oriented with no focal aeakness. She was seen top have bruises in her rt shin. Pt has chronic tremor on her left UE CTH: No acute intracranial hemorrhage, mass effect, or evidence of acute infarction Medications:eliquis BP:148/105 P:101 Blood glucose:192  There is history of Recent Anticoagulants.  Past Medical History:     . Hypertension     . Atrial Fibrillation     . Stroke     . There is NO history of Coronary Artery Disease  Anticoagulant use:  eliquis     Examination: BP(148/105), Pulse(101), Blood Glucose(192) 1A: Level of Consciousness - Alert; keenly responsive + 0 1B: Ask Month and Age - Both Questions Right + 0 1C: Blink Eyes & Squeeze Hands - Performs Both Tasks + 0 2: Test Horizontal Extraocular Movements - Normal + 0 3: Test Visual Fields - No Visual Loss + 0 4: Test Facial Palsy (Use Grimace if Obtunded) - Normal symmetry + 0 5A: Test Left Arm Motor Drift - No Drift for 10 Seconds + 0 5B: Test Right Arm Motor Drift - No Drift for 10 Seconds + 0 6A: Test Left Leg Motor Drift - No Drift for 5 Seconds + 0 6B: Test Right Leg Motor Drift - No Drift for 5 Seconds + 0 7: Test Limb Ataxia (FNF/Heel-Shin) - No Ataxia + 0 8:  Test Sensation - Normal; No sensory loss + 0 9: Test Language/Aphasia - Normal; No aphasia + 0 10: Test Dysarthria - Normal + 0 11: Test Extinction/Inattention - No abnormality + 0  NIHSS Score: 0  Pre-Morbid Modified Ranking Scale: 1 Points = No significant disability despite symptoms; able to carry out all usual duties and  activities   Patient/Family was informed the Neurology Consult would happen via TeleHealth consult by way of interactive audio and video telecommunications and consented to receiving care in this manner.   Due to the immediate potential for life-threatening deterioration due to underlying acute neurologic illness, I spent 35 minutes providing critical care. This time includes time for face to face visit via telemedicine, review of medical records, imaging studies and discussion of findings with providers, the patient and/or family.   Dr Reece Levy Silvina Hackleman   [facility] [phone]   Case [id number]

## 2019-11-29 LAB — BASIC METABOLIC PANEL
Anion gap: 9 (ref 5–15)
BUN: 22 mg/dL (ref 8–23)
CO2: 26 mmol/L (ref 22–32)
Calcium: 9.2 mg/dL (ref 8.9–10.3)
Chloride: 100 mmol/L (ref 98–111)
Creatinine, Ser: 1.03 mg/dL — ABNORMAL HIGH (ref 0.44–1.00)
GFR calc Af Amer: >60 mL/min (ref >60)
GFR calc non Af Amer: 52 mL/min — ABNORMAL LOW (ref >60)
Glucose, Bld: 145 mg/dL — ABNORMAL HIGH (ref 70–99)
Potassium: 4.7 mmol/L (ref 3.5–5.1)
Sodium: 135 mmol/L (ref 135–145)

## 2019-11-29 LAB — GLUCOSE, CAPILLARY
Glucose-Capillary: 114 mg/dL — ABNORMAL HIGH (ref 70–99)
Glucose-Capillary: 101 mg/dL — ABNORMAL HIGH (ref 70–99)

## 2019-11-29 LAB — SARS CORONAVIRUS 2 (TAT 6-24 HRS): SARS Coronavirus 2: NEGATIVE

## 2019-11-29 LAB — CBG MONITORING, ED
Glucose-Capillary: 128 mg/dL — ABNORMAL HIGH (ref 70–99)
Glucose-Capillary: 131 mg/dL — ABNORMAL HIGH (ref 70–99)

## 2019-11-29 MED ORDER — SODIUM CHLORIDE 0.9 % IV SOLN: INTRAVENOUS | Status: AC

## 2019-11-29 MED ORDER — ENSURE ENLIVE PO LIQD
237.0000 mL | Freq: Two times a day (BID) | ORAL | Status: DC
  Administered 2019-11-29 – 2019-12-04 (×9): 237 mL via ORAL

## 2019-11-29 MED ORDER — INSULIN ASPART 100 UNIT/ML ~~LOC~~ SOLN
0.0000 [IU] | Freq: Three times a day (TID) | SUBCUTANEOUS | Status: DC
  Administered 2019-11-29 – 2019-11-30 (×3): 1 [IU] via SUBCUTANEOUS
  Administered 2019-12-01: 2 [IU] via SUBCUTANEOUS
  Administered 2019-12-02: 1 [IU] via SUBCUTANEOUS
  Administered 2019-12-03: 2 [IU] via SUBCUTANEOUS
  Administered 2019-12-03 – 2019-12-04 (×2): 1 [IU] via SUBCUTANEOUS
  Administered 2019-12-04: 3 [IU] via SUBCUTANEOUS

## 2019-11-29 MED ORDER — INSULIN ASPART 100 UNIT/ML ~~LOC~~ SOLN: 0.0000 [IU] | Freq: Every day | SUBCUTANEOUS | Status: DC

## 2019-11-29 NOTE — Progress Notes (Signed)
PROGRESS NOTE    Holly Sparks  YQI:[id number] DOB: [birth year] DOA: 11/28/2019 PCP: Gareth Morgan, MD   Brief Narrative:  Per HPI: Holly Sparks is a 79 y.o. 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 She has DM, HTN HLD, and a. Fib. She was last admitted 11/07/19 for right UE weakness with complete resolution of symptoms in the ED. Her eval include Carotid dopplers, 2 D echo and MRI brain. She was in her usual state of declining health and decreasing mobility until today when by her husband's report she had increased weakness, possibly favoring the right and he had great difficulty in helping her mobilize. EMS was called and she was brought to [facility] ED for evaluation. She denies pain, focal weakness or other major changes that she can recall.  1/1: Patient was admitted with strokelike symptoms with wide differential of possible toxic/metabolic disturbances still a possibility.  She was recently discharged on aspirin and Eliquis with TIA at that time.  Brain MRI ordered and currently pending.  She will need to remain on Eliquis.  PT/OT evaluation ordered.  Covid testing negative.  Assessment & Plan:   Active Problems:   Diabetes (HCC)   Essential hypertension, benign   Stroke-like symptoms   HLD (hyperlipidemia)   Paroxysmal atrial fibrillation (HCC)   Adult failure to thrive   Acute encephalopathy with generalized weakness -Concern for stroke-like symptoms, but CT head negative -We will order brain MRI for further evaluation -PT/OT evaluation to help determine disposition -Continue Eliquis and statin  Type 2 diabetes -Continue home Metformin -Last A1c 11/08/2019 noted to be 7.5% -We will add SSI to carb modified diet due to some hyperglycemia  Dyslipidemia -Continue home statin  Hypertension-controlled -Continue metoprolol -Continue lisinopril -Hold HCTZ with gentle IV fluid ordered  Paroxysmal  atrial fibrillation-stable -Continue on Eliquis for anticoagulation -Continue metoprolol for rate control  Adult failure to thrive -Appreciate PT/OT evaluation -CSW for SNF placement  DVT prophylaxis: Eliquis Code Status: DNR Family Communication: We will plan to call spouse Disposition Plan: Plan for further evaluation of CVA with brain MRI.  PT/OT evaluation pending.   Consultants:   None  Procedures:   None  Antimicrobials:   None   Subjective: Patient seen and evaluated today with no new acute complaints or concerns. No acute concerns or events noted overnight.  She continues to have some ongoing weakness and appears slightly confused.  Objective: Vitals:   11/29/19 0630 11/29/19 0700 11/29/19 0830 11/29/19 0900  BP: 134/85 (!) 150/101 (!) 140/95 131/80  Pulse:      Resp: 14 14 13 15   Temp:      TempSrc:      SpO2:      Weight:      Height:        Intake/Output Summary (Last 24 hours) at 11/29/2019 1050 Last data filed at 11/29/2019 1046 Gross per 24 hour  Intake 517.56 ml  Output 13 ml  Net 504.56 ml   Filed Weights   11/28/19 1928  Weight: 58.1 kg    Examination:  General exam: Appears calm and comfortable, minimally confused Respiratory system: Clear to auscultation. Respiratory effort normal.  Currently on room air. Cardiovascular system: S1 & S2 heard, RRR. No JVD, murmurs, rubs, gallops or clicks. No pedal edema. Gastrointestinal system: Abdomen is nondistended, soft and nontender. No organomegaly or masses felt. Normal bowel sounds heard. Central nervous system: Alert and awake. Extremities: Symmetric 5  x 5 power. Skin: No rashes, lesions or ulcers Psychiatry: Flat affect.    Data Reviewed: I have personally reviewed following labs and imaging studies  CBC: Recent Labs  Lab 11/28/19 1926 11/28/19 1935  WBC 9.4  --   NEUTROABS 7.8*  --   HGB 13.2 13.9  HCT 41.3 41.0  MCV 90.4  --   PLT 230  --    Basic Metabolic Panel: Recent  Labs  Lab 11/28/19 1926 11/28/19 1935  NA 131* 132*  K 5.0 5.1  CL 96* 98  CO2 22  --   GLUCOSE 202* 200*  BUN 28* 28*  CREATININE 1.30* 1.20*  CALCIUM 9.4  --    GFR: Estimated Creatinine Clearance: 36 mL/min (A) (by C-G formula based on SCr of 1.2 mg/dL (H)). Liver Function Tests: Recent Labs  Lab 11/28/19 1926  AST 15  ALT 10  ALKPHOS 76  BILITOT 0.9  PROT 7.2  ALBUMIN 3.9   No results for input(s): LIPASE, AMYLASE in the last 168 hours. No results for input(s): AMMONIA in the last 168 hours. Coagulation Profile: Recent Labs  Lab 11/28/19 1926  INR 1.3*   Cardiac Enzymes: No results for input(s): CKTOTAL, CKMB, CKMBINDEX, TROPONINI in the last 168 hours. BNP (last 3 results) No results for input(s): PROBNP in the last 8760 hours. HbA1C: No results for input(s): HGBA1C in the last 72 hours. CBG: Recent Labs  Lab 11/28/19 1921 11/29/19 0755  GLUCAP 192* 128*   Lipid Profile: No results for input(s): CHOL, HDL, LDLCALC, TRIG, CHOLHDL, LDLDIRECT in the last 72 hours. Thyroid Function Tests: No results for input(s): TSH, T4TOTAL, FREET4, T3FREE, THYROIDAB in the last 72 hours. Anemia Panel: No results for input(s): VITAMINB12, FOLATE, FERRITIN, TIBC, IRON, RETICCTPCT in the last 72 hours. Sepsis Labs: No results for input(s): PROCALCITON, LATICACIDVEN in the last 168 hours.  No results found for this or any previous visit (from the past 240 hour(s)).       Radiology Studies: CT HEAD CODE STROKE WO CONTRAST  Result Date: 11/28/2019 CLINICAL DATA:  Code stroke. Acute presentation with weakness in the legs and confusion. EXAM: CT HEAD WITHOUT CONTRAST TECHNIQUE: Contiguous axial images were obtained from the base of the skull through the vertex without intravenous contrast. COMPARISON:  MRI 11/07/2019 FINDINGS: Brain: Generalized atrophy. 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 Chronic small-vessel ischemic  changes of the white matter. Old small left posterior frontal cortical infarction. No sign of acute infarction, mass lesion, hemorrhage, hydrocephalus or extra-axial collection. Vascular: There is atherosclerotic calcification of the major vessels at the base of the brain. Skull: Negative Sinuses/Orbits: Chronic opacification of the left division the sphenoid sinus. Other sinuses clear orbits negative Other: None ASPECTS ([place] Stroke Program Early CT Score) - Ganglionic level infarction (caudate, lentiform nuclei, internal capsule, insula, M1-M3 cortex): 7 - Supraganglionic infarction (M4-M6 cortex): 3 Total score (0-10 with 10 being normal): 10 IMPRESSION: 1. No acute finding by CT. Old infarctions in the left MCA territory affecting the basal ganglia, temporal lobe and posterior frontal lobe. 2. ASPECTS is 10, allowing for the chronic findings. 3. These results were called by telephone at the time of interpretation on 11/28/2019 at 7:26 pm to provider [facility] , who verbally acknowledged these results. Electronically Signed   By: Nelson Chimes M.D.   On: 11/28/2019 19:27        Scheduled Meds: . apixaban  5 mg Oral BID  . atorvastatin  20 mg Oral QHS  . diltiazem  120 mg Oral Daily  . docusate sodium  100 mg Oral BID  . feeding supplement (PRO-STAT SUGAR FREE 64)  30 mL Oral BID BM  . lisinopril  20 mg Oral Daily   And  . hydrochlorothiazide  12.5 mg Oral Daily  . insulin aspart  0-9 Units Subcutaneous TID WC  . magnesium oxide  400 mg Oral Daily  . metFORMIN  500 mg Oral BID WC  . metoprolol tartrate  12.5 mg Oral BID  . pantoprazole  40 mg Oral Daily  . sodium chloride flush  3 mL Intravenous Q12H   Continuous Infusions: . sodium chloride       LOS: 1 day    Time spent: 30 minutes    Abbey Veith Hoover Brunette, DO [facility] Pager [phone]  If 7PM-7AM, please contact night-coverage [email or web address] Password [email or web address] 11/29/2019, 10:50 AM

## 2019-11-29 NOTE — ED Notes (Signed)
Patient ate breakfast with assistance.

## 2019-11-29 NOTE — ED Notes (Signed)
Patient ate lunch with assistance

## 2019-11-29 NOTE — ED Notes (Signed)
ED TO INPATIENT HANDOFF REPORT  ED Nurse Name and Phone #: [phone]  S Name/Age/Gender Holly Sparks 79 y.o. female Room/Bed: APA07/APA07  Code Status   Code Status: DNR  Home/SNF/Other Home Patient oriented to: self, place, time and situation Is this baseline? Yes   Triage Complete: Triage complete  Chief Complaint Stroke-like symptoms [R29.90]  Triage Note Patient is from home with husband who reports that lethargy all day. Last know well was approximately 1800. Husband reports her legs gave out when trying to stand.    Allergies No Known Allergies  Level of Care/Admitting Diagnosis ED Disposition    ED Disposition Condition [facility]: [facility] [[id number]]  Level of Care: Med-Surg [16]  Covid Evaluation: Asymptomatic Screening Protocol (No Symptoms)  Diagnosis: Stroke-like symptoms [053976]  Admitting Physician: Neena Rhymes [[id number]]  Attending Physician: Adella Hare E [[id number]]  Estimated length of stay: 3 - 4 days  Certification:: I certify this patient will need inpatient services for at least 2 midnights       B Medical/Surgery History Past Medical History:  Diagnosis Date  . Diabetes ([place])    Type 2 x 10 yrs  . Dysrhythmia    AFib  . GERD (gastroesophageal reflux disease)   . Grade I diastolic dysfunction 73/02/1936  . High cholesterol   . Hypertension    x 10 yrs  . Paroxysmal a-fib: Remote hx of afib 06/15/2015  . Stroke [facility])    no deficits   Past Surgical History:  Procedure Laterality Date  . CATARACT EXTRACTION W/PHACO Left 06/06/2016   Procedure: CATARACT EXTRACTION PHACO AND INTRAOCULAR LENS PLACEMENT (IOC);  Surgeon: Tonny Branch, MD;  Location: [facility] ORS;  Service: Ophthalmology;  Laterality: Left;  CDE: 8.32  . COLONOSCOPY    . COLONOSCOPY N/A 05/08/2013   Procedure: COLONOSCOPY;  Surgeon: Rogene [place], MD;  Location: [facility] ENDO SUITE;  Service: Endoscopy;  Laterality: N/A;  1015  . NECK SURGERY     x  2 for spur and disc problems     A IV Location/Drains/Wounds Patient Lines/Drains/Airways Status   Active Line/Drains/Airways    Name:   Placement date:   Placement time:   Site:   Days:   Peripheral IV 11/28/19 Left Antecubital   11/28/19    1905    Antecubital   1   External Urinary Catheter   11/28/19    1959    --   1   Incision (Closed) 06/06/16 Eye Left   06/06/16    1014     1271   Wound / Incision (Open or Dehisced) 09/03/18 Diabetic ulcer Foot Right Top of R foot   09/03/18    2245    Foot   452   Wound / Incision (Open or Dehisced) 09/03/18 Diabetic ulcer Foot Right Inner aspect of right foot   09/03/18    2245    Foot   452   Wound / Incision (Open or Dehisced) 10/08/19 Diabetic ulcer Ankle Right;Medial Lg blister with congealed fluid; debrided which left the following    10/08/19    1139    Ankle   52   Wound / Incision (Open or Dehisced) 10/08/19 Other (Comment) Knee Right middle and largest of abrasions    10/08/19    1140    Knee   52          Intake/Output Last 24 hours  Intake/Output Summary (Last 24 hours) at 11/29/2019 1405 Last  data filed at 11/29/2019 1046 Gross per 24 hour  Intake 517.56 ml  Output 13 ml  Net 504.56 ml    Labs/Imaging Results for orders placed or performed during the hospital encounter of 11/28/19 (from the past 48 hour(s))  CBG monitoring, ED     Status: Abnormal   Collection Time: 11/28/19  7:21 PM  Result Value Ref Range   Glucose-Capillary 192 (H) 70 - 99 mg/dL  Ethanol     Status: None   Collection Time: 11/28/19  7:26 PM  Result Value Ref Range   Alcohol, Ethyl (B) <10 <10 mg/dL    Comment: (NOTE) Lowest detectable limit for serum alcohol is 10 mg/dL. For medical purposes only. Performed at [facility], [address], [place], [place] [address]   Protime-INR     Status: Abnormal   Collection Time: 11/28/19  7:26 PM  Result Value Ref Range   Prothrombin Time 16.4 (H) 11.4 - 15.2 seconds   INR 1.3 (H) 0.8 - 1.2    Comment:  (NOTE) INR goal varies based on device and disease states. Performed at [facility], [address] [place], [place] [address]   APTT     Status: None   Collection Time: 11/28/19  7:26 PM  Result Value Ref Range   aPTT 30 24 - 36 seconds    Comment: Performed at [facility], [address] [place], [place] [address]  CBC     Status: None   Collection Time: 11/28/19  7:26 PM  Result Value Ref Range   WBC 9.4 4.0 - 10.5 K/uL   RBC 4.57 3.87 - 5.11 MIL/uL   Hemoglobin 13.2 12.0 - 15.0 g/dL   HCT 41.3 36.0 - 46.0 %   MCV 90.4 80.0 - 100.0 fL   MCH 28.9 26.0 - 34.0 pg   MCHC 32.0 30.0 - 36.0 g/dL   RDW 12.9 11.5 - 15.5 %   Platelets 230 150 - 400 K/uL   nRBC 0.0 0.0 - 0.2 %    Comment: Performed at [facility], [address] [place], [place] [address]  Differential     Status: Abnormal   Collection Time: 11/28/19  7:26 PM  Result Value Ref Range   Neutrophils Relative % 84 %   Neutro Abs 7.8 (H) 1.7 - 7.7 K/uL   Lymphocytes Relative 11 %   Lymphs Abs 1.1 0.7 - 4.0 K/uL   Monocytes Relative 5 %   Monocytes Absolute 0.5 0.1 - 1.0 K/uL   Eosinophils Relative 0 %   Eosinophils Absolute 0.0 0.0 - 0.5 K/uL   Basophils Relative 0 %   Basophils Absolute 0.0 0.0 - 0.1 K/uL   Immature Granulocytes 0 %   Abs Immature Granulocytes 0.03 0.00 - 0.07 K/uL    Comment: Performed at [facility], [address], [place], [place] [address]  Comprehensive metabolic panel     Status: Abnormal   Collection Time: 11/28/19  7:26 PM  Result Value Ref Range   Sodium 131 (L) 135 - 145 mmol/L   Potassium 5.0 3.5 - 5.1 mmol/L   Chloride 96 (L) 98 - 111 mmol/L   CO2 22 22 - 32 mmol/L   Glucose, Bld 202 (H) 70 - 99 mg/dL   BUN 28 (H) 8 - 23 mg/dL   Creatinine, Ser 1.30 (H) 0.44 - 1.00 mg/dL   Calcium 9.4 8.9 - 10.3 mg/dL   Total Protein 7.2 6.5 - 8.1 g/dL   Albumin 3.9 3.5 - 5.0 g/dL  AST 15 15 - 41 U/L   ALT 10 0 - 44 U/L   Alkaline Phosphatase 76 38 - 126 U/L   Total Bilirubin 0.9 0.3 - 1.2  mg/dL   GFR calc non Af Amer 40 (L) >60 mL/min   GFR calc Af Amer 46 (L) >60 mL/min   Anion gap 13 5 - 15    Comment: Performed at [facility], [address], [place], [place] [address]  I-stat chem 8, ED     Status: Abnormal   Collection Time: 11/28/19  7:35 PM  Result Value Ref Range   Sodium 132 (L) 135 - 145 mmol/L   Potassium 5.1 3.5 - 5.1 mmol/L   Chloride 98 98 - 111 mmol/L   BUN 28 (H) 8 - 23 mg/dL   Creatinine, Ser 1.20 (H) 0.44 - 1.00 mg/dL   Glucose, Bld 200 (H) 70 - 99 mg/dL   Calcium, Ion 1.14 (L) 1.15 - 1.40 mmol/L   TCO2 25 22 - 32 mmol/L   Hemoglobin 13.9 12.0 - 15.0 g/dL   HCT 41.0 36.0 - 46.0 %  Urine rapid drug screen (hosp performed)     Status: None   Collection Time: 11/28/19  7:55 PM  Result Value Ref Range   Opiates NONE DETECTED NONE DETECTED   Cocaine NONE DETECTED NONE DETECTED   Benzodiazepines NONE DETECTED NONE DETECTED   Amphetamines NONE DETECTED NONE DETECTED   Tetrahydrocannabinol NONE DETECTED NONE DETECTED   Barbiturates NONE DETECTED NONE DETECTED    Comment: (NOTE) DRUG SCREEN FOR MEDICAL PURPOSES ONLY.  IF CONFIRMATION IS NEEDED FOR ANY PURPOSE, NOTIFY LAB WITHIN 5 DAYS. LOWEST DETECTABLE LIMITS FOR URINE DRUG SCREEN Drug Class                     Cutoff (ng/mL) Amphetamine and metabolites    1000 Barbiturate and metabolites    200 Benzodiazepine                 621 Tricyclics and metabolites     300 Opiates and metabolites        300 Cocaine and metabolites        300 THC                            50 Performed at [facility] [place], [place] [address]   Urinalysis, Routine w reflex microscopic     Status: Abnormal   Collection Time: 11/28/19  7:55 PM  Result Value Ref Range   Color, Urine YELLOW YELLOW   APPearance CLEAR CLEAR   Specific Gravity, Urine 1.018 1.005 - 1.030   pH 6.0 5.0 - 8.0   Glucose, UA 50 (A) NEGATIVE mg/dL   Hgb urine dipstick NEGATIVE NEGATIVE   Bilirubin Urine NEGATIVE NEGATIVE    Ketones, ur 5 (A) NEGATIVE mg/dL   Protein, ur NEGATIVE NEGATIVE mg/dL   Nitrite NEGATIVE NEGATIVE   Leukocytes,Ua NEGATIVE NEGATIVE    Comment: Performed at [facility], [address] [place], [place] [address]  POC SARS Coronavirus 2 Ag-ED - Nasal Swab (BD Veritor Kit)     Status: None   Collection Time: 11/28/19  8:29 PM  Result Value Ref Range   SARS Coronavirus 2 Ag NEGATIVE NEGATIVE    Comment: (NOTE) SARS-CoV-2 antigen NOT DETECTED.  Negative results are presumptive.  Negative results do not preclude SARS-CoV-2 infection and should not be used as the sole basis for  treatment or other patient management decisions, including infection  control decisions, particularly in the presence of clinical signs and  symptoms consistent with COVID-19, or in those who have been in contact with the virus.  Negative results must be combined with clinical observations, patient history, and epidemiological information. The expected result is Negative. Fact Sheet for Patients: PodPark.tn Fact Sheet for Healthcare Providers: GiftContent.is This test is not yet approved or cleared by the [place] FDA and  has been authorized for detection and/or diagnosis of SARS-CoV-2 by FDA under an Emergency Use Authorization (EUA).  This EUA will remain in effect (meaning this test can be used) for the duration of  the COVID-19 de claration under Section 564(b)(1) of the Act, 21 U.S.C. section 360bbb-3(b)(1), unless the authorization is terminated or revoked sooner.   CBG monitoring, ED     Status: Abnormal   Collection Time: 11/29/19  7:55 AM  Result Value Ref Range   Glucose-Capillary 128 (H) 70 - 99 mg/dL  Basic metabolic panel     Status: Abnormal   Collection Time: 11/29/19 12:30 PM  Result Value Ref Range   Sodium 135 135 - 145 mmol/L   Potassium 4.7 3.5 - 5.1 mmol/L   Chloride 100 98 - 111 mmol/L   CO2 26 22 - 32 mmol/L   Glucose,  Bld 145 (H) 70 - 99 mg/dL   BUN 22 8 - 23 mg/dL   Creatinine, Ser 1.03 (H) 0.44 - 1.00 mg/dL   Calcium 9.2 8.9 - 10.3 mg/dL   GFR calc non Af Amer 52 (L) >60 mL/min   GFR calc Af Amer >60 >60 mL/min   Anion gap 9 5 - 15    Comment: Performed at [facility], [address] [place], [place] [address]  CBG monitoring, ED     Status: Abnormal   Collection Time: 11/29/19 12:46 PM  Result Value Ref Range   Glucose-Capillary 131 (H) 70 - 99 mg/dL   CT HEAD CODE STROKE WO CONTRAST  Result Date: 11/28/2019 CLINICAL DATA:  Code stroke. Acute presentation with weakness in the legs and confusion. EXAM: CT HEAD WITHOUT CONTRAST TECHNIQUE: Contiguous axial images were obtained from the base of the skull through the vertex without intravenous contrast. COMPARISON:  MRI 11/07/2019 FINDINGS: Brain: Generalized atrophy. 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 Chronic small-vessel ischemic changes of the white matter. Old small left posterior frontal cortical infarction. No sign of acute infarction, mass lesion, hemorrhage, hydrocephalus or extra-axial collection. Vascular: There is atherosclerotic calcification of the major vessels at the base of the brain. Skull: Negative Sinuses/Orbits: Chronic opacification of the left division the sphenoid sinus. Other sinuses clear orbits negative Other: None ASPECTS ([place] Stroke Program Early CT Score) - Ganglionic level infarction (caudate, lentiform nuclei, internal capsule, insula, M1-M3 cortex): 7 - Supraganglionic infarction (M4-M6 cortex): 3 Total score (0-10 with 10 being normal): 10 IMPRESSION: 1. No acute finding by CT. Old infarctions in the left MCA territory affecting the basal ganglia, temporal lobe and posterior frontal lobe. 2. ASPECTS is 10, allowing for the chronic findings. 3. These results were called by telephone at the time of interpretation on 11/28/2019 at 7:26 pm to provider [facility] , who verbally  acknowledged these results. Electronically Signed   By: Nelson Chimes M.D.   On: 11/28/2019 19:27    Pending Labs Unresulted Labs (From admission, onward)    Start     Ordered   11/30/19 0500  AMBMP  Tomorrow morning,   R     11/29/19 1050   11/30/19 0500  AMCBC  Tomorrow morning,   R     11/29/19 1050   11/28/19 2059  SARS CORONAVIRUS 2 (TAT 6-24 HRS) Nasopharyngeal Nasopharyngeal Swab  (Tier 3 (TAT 6-24 hrs))  Once,   STAT    Question Answer Comment  Is this test for diagnosis or screening Screening   Symptomatic for COVID-19 as defined by CDC No   Hospitalized for COVID-19 No   Admitted to ICU for COVID-19 No   Previously tested for COVID-19 Yes   Resident in a congregate (group) care setting No   Employed in healthcare setting No   Pregnant No      11/28/19 2058          Vitals/Pain Today's Vitals   11/29/19 1030 11/29/19 1100 11/29/19 1130 11/29/19 1200  BP: 120/80 135/77 (!) 124/93 110/71  Pulse:      Resp: '16 14 17 14  ' Temp:      TempSrc:      SpO2:      Weight:      Height:      PainSc:        Isolation Precautions No active isolations  Medications Medications  diltiazem (CARDIZEM CD) 24 hr capsule 120 mg (120 mg Oral Given 11/29/19 1046)  feeding supplement (PRO-STAT SUGAR FREE 64) liquid 30 mL (30 mLs Oral Not Given 11/29/19 0942)  acetaminophen (TYLENOL) tablet 650 mg (has no administration in time range)  atorvastatin (LIPITOR) tablet 20 mg (20 mg Oral Given 11/28/19 2224)  metoprolol tartrate (LOPRESSOR) tablet 12.5 mg (12.5 mg Oral Given 11/29/19 0940)  metFORMIN (GLUCOPHAGE) tablet 500 mg (500 mg Oral Given 11/29/19 0940)  magnesium oxide (MAG-OX) tablet 400 mg (400 mg Oral Given 11/29/19 0940)  ondansetron (ZOFRAN) tablet 4 mg (has no administration in time range)  pantoprazole (PROTONIX) EC tablet 40 mg (40 mg Oral Given 11/29/19 0941)  apixaban (ELIQUIS) tablet 5 mg (5 mg Oral Given 11/29/19 0940)  sodium chloride flush (NS) 0.9 % injection 3 mL (3 mLs  Intravenous Not Given 11/29/19 0943)  sodium chloride flush (NS) 0.9 % injection 3 mL (has no administration in time range)  0.9 %  sodium chloride infusion (has no administration in time range)  docusate sodium (COLACE) capsule 100 mg (100 mg Oral Given 11/29/19 0940)  0.45 % sodium chloride infusion ( Intravenous Stopped 11/29/19 1046)  insulin aspart (novoLOG) injection 0-9 Units (1 Units Subcutaneous Given 11/29/19 1256)  lisinopril (ZESTRIL) tablet 20 mg (20 mg Oral Given 11/29/19 0940)  0.9 %  sodium chloride infusion ( Intravenous New Bag/Given 11/29/19 1123)  insulin aspart (novoLOG) injection 0-9 Units (1 Units Subcutaneous Given 11/29/19 1256)  insulin aspart (novoLOG) injection 0-5 Units (has no administration in time range)    Mobility walks with device Low fall risk   Focused Assessments    R Recommendations: See Admitting Provider Note  Report given to:   Additional Notes:

## 2019-11-30 LAB — BASIC METABOLIC PANEL
Anion gap: 13 (ref 5–15)
BUN: 30 mg/dL — ABNORMAL HIGH (ref 8–23)
CO2: 25 mmol/L (ref 22–32)
Calcium: 9.8 mg/dL (ref 8.9–10.3)
Chloride: 99 mmol/L (ref 98–111)
Creatinine, Ser: 1.09 mg/dL — ABNORMAL HIGH (ref 0.44–1.00)
GFR calc Af Amer: 57 mL/min — ABNORMAL LOW (ref >60)
GFR calc non Af Amer: 49 mL/min — ABNORMAL LOW (ref >60)
Glucose, Bld: 112 mg/dL — ABNORMAL HIGH (ref 70–99)
Potassium: 4.3 mmol/L (ref 3.5–5.1)
Sodium: 137 mmol/L (ref 135–145)

## 2019-11-30 LAB — CBC
HCT: 39.4 % (ref 36.0–46.0)
Hemoglobin: 12.5 g/dL (ref 12.0–15.0)
MCH: 29.1 pg (ref 26.0–34.0)
MCHC: 31.7 g/dL (ref 30.0–36.0)
MCV: 91.6 fL (ref 80.0–100.0)
Platelets: 197 10*3/uL (ref 150–400)
RBC: 4.3 MIL/uL (ref 3.87–5.11)
RDW: 13.1 % (ref 11.5–15.5)
WBC: 5.4 10*3/uL (ref 4.0–10.5)
nRBC: 0 % (ref 0.0–0.2)

## 2019-11-30 LAB — GLUCOSE, CAPILLARY
Glucose-Capillary: 92 mg/dL (ref 70–99)
Glucose-Capillary: 138 mg/dL — ABNORMAL HIGH (ref 70–99)
Glucose-Capillary: 148 mg/dL — ABNORMAL HIGH (ref 70–99)
Glucose-Capillary: 136 mg/dL — ABNORMAL HIGH (ref 70–99)

## 2019-11-30 MED ORDER — ASPIRIN 81 MG PO CHEW
81.0000 mg | CHEWABLE_TABLET | Freq: Every day | ORAL | Status: DC
  Administered 2019-11-30 – 2019-12-01 (×2): 81 mg via ORAL
  Filled 2019-11-30 (×2): qty 1

## 2019-11-30 NOTE — Plan of Care (Signed)
  Problem: Acute Rehab PT Goals(only PT should resolve) Goal: Pt Will Go Supine/Side To Sit Outcome: Progressing Flowsheets (Taken 11/30/2019 1026) Pt will go Supine/Side to Sit: with supervision Goal: Patient Will Transfer Sit To/From Stand Outcome: Progressing Flowsheets (Taken 11/30/2019 1026) Patient will transfer sit to/from stand: with min guard assist Goal: Pt Will Transfer Bed To Chair/Chair To Bed Outcome: Progressing Flowsheets (Taken 11/30/2019 1026) Pt will Transfer Bed to Chair/Chair to Bed: min guard assist Goal: Pt Will Ambulate Outcome: Progressing Flowsheets (Taken 11/30/2019 1026) Pt will Ambulate:  75 feet  with min guard assist  with rolling walker   10:27 AM, 11/30/19 Ocie Bob, MPT Physical Therapist with [facility] 336 [phone] office [phone] mobile phone

## 2019-11-30 NOTE — Progress Notes (Signed)
PROGRESS NOTE    Holly Sparks  EXB:[id number] DOB: 1940-12-25 DOA: 11/28/2019 PCP: Lemmie Evens, MD   Brief Narrative:  Per HPI: Holly Sparks a 79 y.o.femalewith medical history significant ofremote CVA resulting in gait problems that require use of a walker. She has DM, HTN HLD, and a. Fib. She was last admitted 11/07/19 for right UE weakness with complete resolution of symptoms in the ED. Her eval include Carotid dopplers, 2 D echo and MRI brain. She was in her usual state of declining health and decreasing mobility until today when by her husband's report she had increased weakness, possibly favoring the right and he had great difficulty in helping her mobilize. EMS was called and she was brought to [facility] for evaluation. She denies pain, focal weakness or other major changes that she can recall.  1/1: Patient was admitted with strokelike symptoms with wide differential of possible toxic/metabolic disturbances still a possibility.  She was recently discharged on aspirin and Eliquis with TIA at that time.  Brain MRI ordered and currently pending.  She will need to remain on Eliquis.  PT/OT evaluation ordered.  Covid testing negative.  1/2: Brain MRI could not be completed as of yet.  We will plan to add aspirin 81 mg daily for 2 weeks.  She has had recent echocardiogram as well as carotid evaluation on 12/10.  She has been seen by PT with recommendations for SNF.  Assessment & Plan:   Active Problems:   Diabetes ([place])   Essential hypertension, benign   Stroke-like symptoms   HLD (hyperlipidemia)   Paroxysmal atrial fibrillation (HCC)   Adult failure to thrive   Acute encephalopathy with generalized weakness -Concern for stroke-like symptoms, but CT head negative -We will order brain MRI for further evaluation -PT/OT evaluation to help determine disposition -Continue Eliquis and statin -Add aspirin 81 mg daily for now for 2 weeks  Type 2 diabetes -Continue home  Metformin -Last A1c 11/08/2019 noted to be 7.5% -We will add SSI to carb modified diet due to some hyperglycemia  Dyslipidemia -Continue home statin  Hypertension-controlled -Continue metoprolol -Continue lisinopril -Hold HCTZ with gentle IV fluid ordered  Paroxysmal atrial fibrillation-stable -Continue on Eliquis for anticoagulation -Continue metoprolol for rate control  Adult failure to thrive -Appreciate PT/OT evaluation with recommendation for SNF -CSW for SNF placement  DVT prophylaxis: Eliquis Code Status: DNR Family Communication: We will plan to call spouse, no response Disposition Plan:  Appreciate PT evaluation with recommendations for SNF on discharge   Consultants:   None  Procedures:   None  Antimicrobials:   None  Subjective: Patient seen and evaluated today with no new acute complaints or concerns. No acute concerns or events noted overnight.  Objective: Vitals:   11/29/19 1926 11/29/19 2125 11/30/19 0550 11/30/19 0806  BP:  (!) 108/59 123/70 (!) 145/74  Pulse:  (!) 102 89 81  Resp:  18 16 17   Temp:  97.8 F (36.6 C) 97.6 F (36.4 C)   TempSrc:  Oral Oral   SpO2: 96% 100% (!) 82% 99%  Weight:      Height:        Intake/Output Summary (Last 24 hours) at 11/30/2019 1057 Last data filed at 11/30/2019 1027 Gross per 24 hour  Intake 307.9 ml  Output 400 ml  Net -92.1 ml   Filed Weights   11/28/19 1928  Weight: 58.1 kg    Examination:  General exam: Appears calm and comfortable  Respiratory system: Clear to auscultation.  Respiratory effort normal. Cardiovascular system: S1 & S2 heard, RRR. No JVD, murmurs, rubs, gallops or clicks. No pedal edema. Gastrointestinal system: Abdomen is nondistended, soft and nontender. No organomegaly or masses felt. Normal bowel sounds heard. Central nervous system: Alert and oriented. No focal neurological deficits. Extremities: Symmetric 5 x 5 power. Skin: No rashes, lesions or  ulcers Psychiatry: Judgement and insight appear normal. Mood & affect appropriate.     Data Reviewed: I have personally reviewed following labs and imaging studies  CBC: Recent Labs  Lab 11/28/19 1926 11/28/19 1935 11/30/19 0740  WBC 9.4  --  5.4  NEUTROABS 7.8*  --   --   HGB 13.2 13.9 12.5  HCT 41.3 41.0 39.4  MCV 90.4  --  91.6  PLT 230  --  197   Basic Metabolic Panel: Recent Labs  Lab 11/28/19 1926 11/28/19 1935 11/29/19 1230 11/30/19 0740  NA 131* 132* 135 137  K 5.0 5.1 4.7 4.3  CL 96* 98 100 99  CO2 22  --  26 25  GLUCOSE 202* 200* 145* 112*  BUN 28* 28* 22 30*  CREATININE 1.30* 1.20* 1.03* 1.09*  CALCIUM 9.4  --  9.2 9.8   GFR: Estimated Creatinine Clearance: 39.6 mL/min (A) (by C-G formula based on SCr of 1.09 mg/dL (H)). Liver Function Tests: Recent Labs  Lab 11/28/19 1926  AST 15  ALT 10  ALKPHOS 76  BILITOT 0.9  PROT 7.2  ALBUMIN 3.9   No results for input(s): LIPASE, AMYLASE in the last 168 hours. No results for input(s): AMMONIA in the last 168 hours. Coagulation Profile: Recent Labs  Lab 11/28/19 1926  INR 1.3*   Cardiac Enzymes: No results for input(s): CKTOTAL, CKMB, CKMBINDEX, TROPONINI in the last 168 hours. BNP (last 3 results) No results for input(s): PROBNP in the last 8760 hours. HbA1C: No results for input(s): HGBA1C in the last 72 hours. CBG: Recent Labs  Lab 11/29/19 0755 11/29/19 1246 11/29/19 1655 11/29/19 2157 11/30/19 0801  GLUCAP 128* 131* 114* 101* 92   Lipid Profile: No results for input(s): CHOL, HDL, LDLCALC, TRIG, CHOLHDL, LDLDIRECT in the last 72 hours. Thyroid Function Tests: No results for input(s): TSH, T4TOTAL, FREET4, T3FREE, THYROIDAB in the last 72 hours. Anemia Panel: No results for input(s): VITAMINB12, FOLATE, FERRITIN, TIBC, IRON, RETICCTPCT in the last 72 hours. Sepsis Labs: No results for input(s): PROCALCITON, LATICACIDVEN in the last 168 hours.  Recent Results (from the past 240  hour(s))  SARS CORONAVIRUS 2 (TAT 6-24 HRS) Nasopharyngeal Nasopharyngeal Swab     Status: None   Collection Time: 11/28/19  9:55 PM   Specimen: Nasopharyngeal Swab  Result Value Ref Range Status   SARS Coronavirus 2 NEGATIVE NEGATIVE Final    Comment: (NOTE) SARS-CoV-2 target nucleic acids are NOT DETECTED. The SARS-CoV-2 RNA is generally detectable in upper and lower respiratory specimens during the acute phase of infection. Negative results do not preclude SARS-CoV-2 infection, do not rule out co-infections with other pathogens, and should not be used as the sole basis for treatment or other patient management decisions. Negative results must be combined with clinical observations, patient history, and epidemiological information. The expected result is Negative. Fact Sheet for Patients: HairSlick.no Fact Sheet for Healthcare Providers: quierodirigir.com This test is not yet approved or cleared by the [place] FDA and  has been authorized for detection and/or diagnosis of SARS-CoV-2 by FDA under an Emergency Use Authorization (EUA). This EUA will remain  in effect (meaning this test  can be used) for the duration of the COVID-19 declaration under Section 56 4(b)(1) of the Act, 21 U.S.C. section 360bbb-3(b)(1), unless the authorization is terminated or revoked sooner. Performed at [facility], [address], [place], [place] [address]          Radiology Studies: CT HEAD CODE STROKE WO CONTRAST  Result Date: 11/28/2019 CLINICAL DATA:  Code stroke. Acute presentation with weakness in the legs and confusion. EXAM: CT HEAD WITHOUT CONTRAST TECHNIQUE: Contiguous axial images were obtained from the base of the skull through the vertex without intravenous contrast. COMPARISON:  MRI 11/07/2019 FINDINGS: Brain: Generalized atrophy. 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  Chronic small-vessel ischemic changes of the white matter. Old small left posterior frontal cortical infarction. No sign of acute infarction, mass lesion, hemorrhage, hydrocephalus or extra-axial collection. Vascular: There is atherosclerotic calcification of the major vessels at the base of the brain. Skull: Negative Sinuses/Orbits: Chronic opacification of the left division the sphenoid sinus. Other sinuses clear orbits negative Other: None ASPECTS (Alberta Stroke Program Early CT Score) - Ganglionic level infarction (caudate, lentiform nuclei, internal capsule, insula, M1-M3 cortex): 7 - Supraganglionic infarction (M4-M6 cortex): 3 Total score (0-10 with 10 being normal): 10 IMPRESSION: 1. No acute finding by CT. Old infarctions in the left MCA territory affecting the basal ganglia, temporal lobe and posterior frontal lobe. 2. ASPECTS is 10, allowing for the chronic findings. 3. These results were called by telephone at the time of interpretation on 11/28/2019 at 7:26 pm to provider [facility] , who verbally acknowledged these results. Electronically Signed   By: Paulina Fusi M.D.   On: 11/28/2019 19:27        Scheduled Meds: . apixaban  5 mg Oral BID  . atorvastatin  20 mg Oral QHS  . diltiazem  120 mg Oral Daily  . docusate sodium  100 mg Oral BID  . feeding supplement (ENSURE ENLIVE)  237 mL Oral BID BM  . feeding supplement (PRO-STAT SUGAR FREE 64)  30 mL Oral BID BM  . insulin aspart  0-5 Units Subcutaneous QHS  . insulin aspart  0-9 Units Subcutaneous TID WC  . insulin aspart  0-9 Units Subcutaneous TID WC  . lisinopril  20 mg Oral Daily  . magnesium oxide  400 mg Oral Daily  . metFORMIN  500 mg Oral BID WC  . metoprolol tartrate  12.5 mg Oral BID  . pantoprazole  40 mg Oral Daily  . sodium chloride flush  3 mL Intravenous Q12H   Continuous Infusions: . sodium chloride       LOS: 2 days    Time spent: 30 minutes    Tsering Leaman Hoover Brunette, DO [facility] Pager  [phone]  If 7PM-7AM, please contact night-coverage [email or web address] Password [facility] 11/30/2019, 10:57 AM

## 2019-11-30 NOTE — Evaluation (Signed)
Physical Therapy Evaluation Patient Details Name: Holly Sparks MRN: [id number] DOB: 1941/06/17 Today's Date: 11/30/2019   History of Present Illness  Holly Sparks is a 79 y.o. 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 She has DM, HTN HLD, and a. Fib. She was last admitted 11/07/19 for right UE weakness with complete resolution of symptoms in the ED. Her eval include Carotid dopplers, 2 D echo and MRI brain. She was in her usual state of declining health and decreasing mobility until today when by her husband's report she had increased weakness, possibly favoring the right and he had great difficulty in helping her mobilize. EMS was called and she was brought to [facility] ED for evaluation. She denies pain, focal weakness or other major changes that she can recall.    Clinical Impression  Patient unsteady on feet with frequent drifting to the left and bumping into nearby objects during ambulation, limited secondary to c/o fatigue and tolerated sitting up in chair after therapy.  Patient will benefit from continued physical therapy in hospital and recommended venue below to increase strength, balance, endurance for safe ADLs and gait.    Follow Up Recommendations SNF    Equipment Recommendations  None recommended by PT    Recommendations for Other Services       Precautions / Restrictions Precautions Precautions: Fall Restrictions Weight Bearing Restrictions: No      Mobility  Bed Mobility Overal bed mobility: Needs Assistance Bed Mobility: Supine to Sit     Supine to sit: Min assist;Min guard     General bed mobility comments: slow labored movement  Transfers Overall transfer level: Needs assistance Equipment used: Rolling walker (2 wheeled) Transfers: Sit to/from [facility] Sit to Stand: Min assist Stand pivot transfers: Min assist       General transfer comment: increased time  labored movement  Ambulation/Gait Ambulation/Gait assistance: Min assist Gait Distance (Feet): 30 Feet Assistive device: Rolling walker (2 wheeled) Gait Pattern/deviations: Decreased step length - right;Decreased step length - left;Decreased stride length;Narrow base of support;Drifts right/left Gait velocity: decreased   General Gait Details: slow labored cadence with frequent drifting to the left and bumping into nearby objects, limited secondary to c/o fatigue  Stairs            Wheelchair Mobility    Modified Rankin (Stroke Patients Only)       Balance Overall balance assessment: Needs assistance Sitting-balance support: Feet supported;No upper extremity supported Sitting balance-Leahy Scale: Fair Sitting balance - Comments: seated edge of chair   Standing balance support: Bilateral upper extremity supported;During functional activity Standing balance-Leahy Scale: Fair Standing balance comment: using RW                             Pertinent Vitals/Pain Pain Assessment: No/denies pain    Home Living Family/patient expects to be discharged to:: Private residence Living Arrangements: Spouse/significant other Available Help at Discharge: Family;Home health;Available PRN/intermittently Type of Home: House Home Access: Stairs to enter Entrance Stairs-Rails: None Entrance Stairs-Number of Steps: 2 Home Layout: One level Home Equipment: Walker - 2 wheels;Cane - single point;Shower seat - built in      Prior Function Level of Independence: Needs assistance   Gait / Transfers Assistance Needed: pt uses RW for mobility, household ambulation stated  ADL's / Homemaking Assistance Needed: Pt has aide 5hrs/day 5 days/week for ADL assistance as well  as her husband        Hand Dominance   Dominant Hand: Right    Extremity/Trunk Assessment   Upper Extremity Assessment Upper Extremity Assessment: Generalized weakness    Lower Extremity  Assessment Lower Extremity Assessment: Generalized weakness    Cervical / Trunk Assessment Cervical / Trunk Assessment: Normal  Communication   Communication: No difficulties  Cognition Arousal/Alertness: Awake/alert Behavior During Therapy: WFL for tasks assessed/performed Overall Cognitive Status: Within Functional Limits for tasks assessed                                        General Comments      Exercises     Assessment/Plan    PT Assessment Patient needs continued PT services  PT Problem List Decreased strength;Decreased activity tolerance;Decreased balance;Decreased mobility       PT Treatment Interventions Gait training;Stair training;Functional mobility training;Therapeutic activities;Therapeutic exercise;Patient/family education    PT Goals (Current goals can be found in the Care Plan section)  Acute Rehab PT Goals Patient Stated Goal: return home with family to assist PT Goal Formulation: With patient Time For Goal Achievement: 12/14/19 Potential to Achieve Goals: Good    Frequency Min 3X/week   Barriers to discharge        Co-evaluation               AM-PAC PT "6 Clicks" Mobility  Outcome Measure Help needed turning from your back to your side while in a flat bed without using bedrails?: None Help needed moving from lying on your back to sitting on the side of a flat bed without using bedrails?: A Little Help needed moving to and from a bed to a chair (including a wheelchair)?: A Little Help needed standing up from a chair using your arms (e.g., wheelchair or bedside chair)?: A Little Help needed to walk in hospital room?: A Lot Help needed climbing 3-5 steps with a railing? : A Lot 6 Click Score: 17    End of Session   Activity Tolerance: Patient tolerated treatment well;Patient limited by fatigue Patient left: in chair;with call bell/phone within reach Nurse Communication: Mobility status PT Visit Diagnosis: Unsteadiness  on feet (R26.81);Other abnormalities of gait and mobility (R26.89);Muscle weakness (generalized) (M62.81)    Time: 2947-6546 PT Time Calculation (min) (ACUTE ONLY): 34 min   Charges:   PT Evaluation $PT Eval Moderate Complexity: 1 Mod PT Treatments $Therapeutic Activity: 23-37 mins        10:24 AM, 11/30/19 Ocie Bob, MPT Physical Therapist with [facility] [phone] office [phone] mobile phone

## 2019-12-01 ENCOUNTER — Other Ambulatory Visit: Payer: Self-pay

## 2019-12-01 LAB — GLUCOSE, CAPILLARY
Glucose-Capillary: 127 mg/dL — ABNORMAL HIGH (ref 70–99)
Glucose-Capillary: 151 mg/dL — ABNORMAL HIGH (ref 70–99)
Glucose-Capillary: 160 mg/dL — ABNORMAL HIGH (ref 70–99)

## 2019-12-01 NOTE — Progress Notes (Signed)
PROGRESS NOTE    Holly Sparks  WIO:[id number] DOB: 10/05/1941 DOA: 11/28/2019 PCP: Gareth Morgan, MD   Brief Narrative:  Per HPI: Holly Sparks a 79 y.o.femalewith medical history significant ofremote CVA resulting in gait problems that require use of a walker. She has DM, HTN HLD, and a. Fib. She was last admitted 11/07/19 for right UE weakness with complete resolution of symptoms in the ED. Her eval include Carotid dopplers, 2 D echo and MRI brain. She was in her usual state of declining health and decreasing mobility until today when by her husband's report she had increased weakness, possibly favoring the right and he had great difficulty in helping her mobilize. EMS was called and she was brought to [facility] ED for evaluation. She denies pain, focal weakness or other major changes that she can recall.  1/1:Patient was admitted with strokelike symptoms with wide differential of possible toxic/metabolic disturbances still a possibility. She was recently discharged on aspirin and Eliquis with TIA at that time. Brain MRI ordered and currently pending. She will need to remain on Eliquis. PT/OT evaluation ordered. Covid testing negative.  1/2: Brain MRI could not be completed as of yet.  We will plan to add aspirin 81 mg daily for 2 weeks.  She has had recent echocardiogram as well as carotid evaluation on 12/10.  She has been seen by PT with recommendations for SNF.  1/3: No acute overnight events noted.  Awaiting SNF placement.  Continue aspirin and Eliquis.  Assessment & Plan:   Active Problems:   Diabetes (HCC)   Essential hypertension, benign   Stroke-like symptoms   HLD (hyperlipidemia)   Paroxysmal atrial fibrillation (HCC)   Adult failure to thrive   Acute encephalopathy with generalized weakness-stabilizing -Concern for stroke-like symptoms, but CT head negative -We will order brain MRI for further evaluation, hopefully can be performed 1/4 -PT/OT evaluation  with recommendation for SNF -Continue Eliquis and statin -Add aspirin 81 mg daily for now for 2 weeks  Type 2 diabetes -Continue home Metformin -Last A1c 11/08/2019 noted to be 7.5% -We will add SSI to carb modified diet due to some hyperglycemia  Dyslipidemia -Continue home statin  Hypertension-controlled -Continue metoprolol -Continue lisinopril -Hold HCTZ and hold further IV fluid  Paroxysmal atrial fibrillation-stable -Continue on Eliquis for anticoagulation -Continue metoprolol for rate control  Adult failure to thrive -Appreciate PT/OT evaluation with recommendation for SNF -CSW for SNF placement  DVT prophylaxis:Eliquis Code Status:DNR Family Communication:We will plan to call spouse, no response Disposition Plan: Appreciate PT evaluation with recommendations for SNF on discharge.  Hopeful discharge in a.m. to SNF if bed available.   Consultants:  None  Procedures:  None  Antimicrobials:   None  Subjective: Patient seen and evaluated today with no new acute complaints or concerns. No acute concerns or events noted overnight.  Objective: Vitals:   11/30/19 1406 11/30/19 2105 12/01/19 0556 12/01/19 1021  BP: (!) 88/65 (!) 132/91 (!) 130/94 121/71  Pulse: (!) 51 77 74 84  Resp: 18  17   Temp: 98.3 F (36.8 C) 97.9 F (36.6 C) 97.8 F (36.6 C)   TempSrc:  Oral    SpO2: 100% 100% 100%   Weight:      Height:        Intake/Output Summary (Last 24 hours) at 12/01/2019 1027 Last data filed at 12/01/2019 1021 Gross per 24 hour  Intake 303 ml  Output 1200 ml  Net -897 ml   Filed Weights   11/28/19  1928  Weight: 58.1 kg    Examination:  General exam: Appears calm and comfortable, mildly confused Respiratory system: Clear to auscultation. Respiratory effort normal.  Currently on room air. Cardiovascular system: S1 & S2 heard, RRR. No JVD, murmurs, rubs, gallops or clicks. No pedal edema. Gastrointestinal system: Abdomen is  nondistended, soft and nontender. No organomegaly or masses felt. Normal bowel sounds heard. Central nervous system: Alert and awake Extremities: Symmetric 5 x 5 power. Skin: No rashes, lesions or ulcers Psychiatry: Judgement and insight appear normal. Mood & affect appropriate.     Data Reviewed: I have personally reviewed following labs and imaging studies  CBC: Recent Labs  Lab 11/28/19 1926 11/28/19 1935 11/30/19 0740  WBC 9.4  --  5.4  NEUTROABS 7.8*  --   --   HGB 13.2 13.9 12.5  HCT 41.3 41.0 39.4  MCV 90.4  --  91.6  PLT 230  --  542   Basic Metabolic Panel: Recent Labs  Lab 11/28/19 1926 11/28/19 1935 11/29/19 1230 11/30/19 0740  NA 131* 132* 135 137  K 5.0 5.1 4.7 4.3  CL 96* 98 100 99  CO2 22  --  26 25  GLUCOSE 202* 200* 145* 112*  BUN 28* 28* 22 30*  CREATININE 1.30* 1.20* 1.03* 1.09*  CALCIUM 9.4  --  9.2 9.8   GFR: Estimated Creatinine Clearance: 39.6 mL/min (A) (by C-G formula based on SCr of 1.09 mg/dL (H)). Liver Function Tests: Recent Labs  Lab 11/28/19 1926  AST 15  ALT 10  ALKPHOS 76  BILITOT 0.9  PROT 7.2  ALBUMIN 3.9   No results for input(s): LIPASE, AMYLASE in the last 168 hours. No results for input(s): AMMONIA in the last 168 hours. Coagulation Profile: Recent Labs  Lab 11/28/19 1926  INR 1.3*   Cardiac Enzymes: No results for input(s): CKTOTAL, CKMB, CKMBINDEX, TROPONINI in the last 168 hours. BNP (last 3 results) No results for input(s): PROBNP in the last 8760 hours. HbA1C: No results for input(s): HGBA1C in the last 72 hours. CBG: Recent Labs  Lab 11/30/19 0801 11/30/19 1140 11/30/19 1640 11/30/19 2042 12/01/19 0720  GLUCAP 92 138* 148* 136* 127*   Lipid Profile: No results for input(s): CHOL, HDL, LDLCALC, TRIG, CHOLHDL, LDLDIRECT in the last 72 hours. Thyroid Function Tests: No results for input(s): TSH, T4TOTAL, FREET4, T3FREE, THYROIDAB in the last 72 hours. Anemia Panel: No results for input(s):  VITAMINB12, FOLATE, FERRITIN, TIBC, IRON, RETICCTPCT in the last 72 hours. Sepsis Labs: No results for input(s): PROCALCITON, LATICACIDVEN in the last 168 hours.  Recent Results (from the past 240 hour(s))  SARS CORONAVIRUS 2 (TAT 6-24 HRS) Nasopharyngeal Nasopharyngeal Swab     Status: None   Collection Time: 11/28/19  9:55 PM   Specimen: Nasopharyngeal Swab  Result Value Ref Range Status   SARS Coronavirus 2 NEGATIVE NEGATIVE Final    Comment: (NOTE) SARS-CoV-2 target nucleic acids are NOT DETECTED. The SARS-CoV-2 RNA is generally detectable in upper and lower respiratory specimens during the acute phase of infection. Negative results do not preclude SARS-CoV-2 infection, do not rule out co-infections with other pathogens, and should not be used as the sole basis for treatment or other patient management decisions. Negative results must be combined with clinical observations, patient history, and epidemiological information. The expected result is Negative. Fact Sheet for Patients: SugarRoll.be Fact Sheet for Healthcare Providers: https://www.woods-mathews.com/ This test is not yet approved or cleared by the [place] and  has been authorized  for detection and/or diagnosis of SARS-CoV-2 by FDA under an Emergency Use Authorization (EUA). This EUA will remain  in effect (meaning this test can be used) for the duration of the COVID-19 declaration under Section 56 4(b)(1) of the Act, 21 U.S.C. section 360bbb-3(b)(1), unless the authorization is terminated or revoked sooner. Performed at [facility], [address], [place], [place] [address]          Radiology Studies: No results found.      Scheduled Meds: . apixaban  5 mg Oral BID  . aspirin  81 mg Oral Daily  . atorvastatin  20 mg Oral QHS  . diltiazem  120 mg Oral Daily  . docusate sodium  100 mg Oral BID  . feeding supplement (ENSURE ENLIVE)  237 mL Oral  BID BM  . feeding supplement (PRO-STAT SUGAR FREE 64)  30 mL Oral BID BM  . insulin aspart  0-5 Units Subcutaneous QHS  . insulin aspart  0-9 Units Subcutaneous TID WC  . insulin aspart  0-9 Units Subcutaneous TID WC  . lisinopril  20 mg Oral Daily  . magnesium oxide  400 mg Oral Daily  . metFORMIN  500 mg Oral BID WC  . metoprolol tartrate  12.5 mg Oral BID  . pantoprazole  40 mg Oral Daily  . sodium chloride flush  3 mL Intravenous Q12H   Continuous Infusions: . sodium chloride       LOS: 3 days    Time spent: 30 minutes    Kairie Vangieson Hoover Brunette, DO [facility] Pager [phone]  If 7PM-7AM, please contact night-coverage [email or web address] Password [facility] 12/01/2019, 10:27 AM

## 2019-12-02 ENCOUNTER — Inpatient Hospital Stay (HOSPITAL_COMMUNITY): Payer: Medicare Other

## 2019-12-02 NOTE — Discharge Summary (Signed)
Physician Discharge Summary  Holly Sparks XBJ:[id number] DOB: 07-02-1941 DOA: 11/28/2019  PCP: Holly Morgan, MD  Admit date: 11/28/2019  Discharge date: 12/02/2019  Admitted From:Home  Disposition:  SNF  Recommendations for Outpatient Follow-up:  1. Follow up with PCP in 1-2 weeks 2. Continue on prior home meds as prescribed previously  Home Health: None  Equipment/Devices: None  Discharge Condition: Stable  CODE STATUS: DNR  Diet recommendation: Heart Healthy/carb modified  Brief/Interim Summary: Per HPI: Holly Oguinn Mooreis a 79 y.o.femalewith medical history significant ofremote CVA resulting in gait problems that require use of a walker. She has DM, HTN HLD, and a. Fib. She was last admitted 11/07/19 for right UE weakness with complete resolution of symptoms in the ED. Her eval include Carotid dopplers, 2 D echo and MRI brain. She was in her usual state of declining health and decreasing mobility until today when by her husband's report she had increased weakness, possibly favoring the right and he had great difficulty in helping her mobilize. EMS was called and she was brought to [facility] ED for evaluation. She denies pain, focal weakness or other major changes that she can recall.  1/1:Patient was admitted with strokelike symptoms with wide differential of possible toxic/metabolic disturbances still a possibility. She was recently discharged on aspirin and Eliquis with TIA at that time. Brain MRI ordered and currently pending. She will need to remain on Eliquis. PT/OT evaluation ordered. Covid testing negative.  1/2:Brain MRI could not be completed as of yet. We will plan to add aspirin 81 mg daily for 2 weeks. She has had recent echocardiogram as well as carotid evaluation on 12/10. She has been seen by PT with recommendations for SNF.  1/3: No acute overnight events noted.  Awaiting SNF placement.  Continue aspirin and Eliquis.  1/4: Patient is stable for  discharge to SNF at this time.  Brain MRI with no new or acute findings.  Will discontinue aspirin have her remain on Eliquis.  She is unsafe to go home.  No other acute events noted throughout the course of this admission.  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  Discharge Instructions  Discharge Instructions    Diet - low sodium heart healthy   Complete by: As directed    Increase activity slowly   Complete by: As directed      Allergies as of 12/02/2019   No Known Allergies     Medication List    TAKE these medications   acetaminophen 325 MG tablet Commonly known as: TYLENOL Take 650 mg by mouth every 6 (six) hours as needed.   apixaban 5 MG Tabs tablet Commonly known as: ELIQUIS Take 1 tablet (5 mg total) by mouth 2 (two) times daily.   atorvastatin 20 MG tablet Commonly known as: LIPITOR Take 1 tablet (20 mg total) by mouth at bedtime.   diltiazem 120 MG 24 hr capsule Commonly known as: Cardizem CD Take 1 capsule (120 mg total) by mouth daily.   feeding supplement (PRO-STAT SUGAR FREE 64) Liqd Take 30 mLs by mouth 2 (two) times daily between meals.   lisinopril-hydrochlorothiazide 20-12.5 MG tablet Commonly known as: ZESTORETIC Take 0.5 tablets by mouth daily.   magnesium oxide 400 MG tablet  Commonly known as: MAG-OX Take 1 tablet (400 mg total) by mouth daily.   metFORMIN 1000 MG tablet Commonly known as: GLUCOPHAGE Take 500 mg by mouth 2 (two) times daily with a meal.   metoprolol tartrate 25 MG tablet Commonly known as: LOPRESSOR Take 12.5 mg by mouth 2 (two) times daily.   ondansetron 4 MG tablet Commonly known as: ZOFRAN Take 4 mg by mouth  every 6 (six) hours as needed for nausea or vomiting.   pantoprazole 40 MG tablet Commonly known as: PROTONIX Take 40 mg by mouth daily.   potassium chloride 10 MEQ tablet Commonly known as: KLOR-CON Take 2 tablets (20 mEq total) by mouth daily.      Follow-up Information    Holly MorganKnowlton, Steve, MD Follow up in 1 week(s).   Specialty: Family Medicine Contact information: [address] [place] [address] [phone]          No Known Allergies  Consultations:  None   Procedures/Studies: CT HEAD WO CONTRAST  Result Date: 11/07/2019 CLINICAL DATA:  TIA EXAM: CT HEAD WITHOUT CONTRAST TECHNIQUE: Contiguous axial images were obtained from the base of the skull through the vertex without intravenous contrast. COMPARISON:  2016 FINDINGS: Brain: There is no acute intracranial hemorrhage, mass-effect, or edema. There is no acute appearing loss of gray-white differentiation. Chronic infarcts are present involving the left basal ganglia and temporal lobe. Additional patchy hypoattenuation in the supratentorial white matter is nonspecific but probably reflects mild chronic microvascular ischemic changes. There is no extra-axial fluid collection. Prominence of the ventricles and sulci reflects mild parenchymal volume loss. Vascular: There is atherosclerotic calcification at the skull base. Skull: Calvarium is unremarkable. Sinuses/Orbits: Partial opacification of the left sphenoid sinus with evidence of chronic inflammation. Other: None. IMPRESSION: No acute intracranial hemorrhage, mass effect, or evidence of acute infarction. Chronic/nonemergent findings detailed above. Electronically Signed   By: Guadlupe SpanishPraneil  Patel M.D.   On: 11/07/2019 12:16   MR BRAIN WO CONTRAST  Result Date: 12/02/2019 CLINICAL DATA:  79 year old female with altered mental status and weakness. Code stroke presentation on 11/28/2019. EXAM: MRI HEAD WITHOUT CONTRAST TECHNIQUE: Multiplanar, multiecho pulse sequences of  the brain and surrounding structures were obtained without intravenous contrast. COMPARISON:  Head CT 11/28/2019.  Brain MRI 11/07/2019. FINDINGS: Brain: No restricted diffusion to suggest acute infarction. No midline shift, mass effect, evidence of mass lesion, ventriculomegaly, extra-axial collection or acute intracranial hemorrhage. Cervicomedullary junction and pituitary are within normal limits. Brain volume loss which appears fairly generalized as described last month. Areas of post ischemic appearing encephalomalacia again noted in the posterior left temporal lobe, left corona radiata and external capsule. Hemosiderin associated with the latter. There might also be small areas of chronic cortical encephalomalacia in the right parietal and posterior left frontal lobes on series 9, image 40. overall stable gray and white matter signal from last month. Vascular: Major intracranial vascular flow voids are stable. Skull and upper cervical spine: Partially visible cervical fusion hardware artifact. Bone marrow signal is stable and within normal limits. Sinuses/Orbits: Stable orbits. Mild left sphenoid sinus mucosal thickening is stable. Other: Mastoids remain clear. Grossly negative internal auditory structures. IMPRESSION: 1.  No acute intracranial abnormality. 2. Noncontrast MRI appearance of the brain remains stable since last month, with a few scattered areas of chronic ischemia more pronounced in the left hemisphere. Electronically Signed   By: Odessa FlemingH  Hall M.D.   On: 12/02/2019 09:13   MR BRAIN WO CONTRAST  Result Date: 11/07/2019 CLINICAL DATA:  Right arm weakness beginning today. Fell last week. EXAM: MRI HEAD WITHOUT CONTRAST TECHNIQUE: Multiplanar, multiecho pulse sequences of the brain and surrounding structures were obtained without intravenous contrast. COMPARISON:  Head CT same day FINDINGS: Brain: Diffusion imaging does not show any acute or subacute infarction. No focal insult affects the brainstem  or cerebellum. There is some brainstem and cerebellar atrophy. Cerebral hemispheres show generalized atrophy with old infarction in the left basal ganglia and radiating white matter tracts particularly the external capsule. There are old left temporal cortical and subcortical infarctions. There is an old left frontoparietal junction cortical infarction. No mass lesion, hemorrhage, hydrocephalus or extra-axial collection. Vascular: Major vessels at the base of the brain show flow. Skull and upper cervical spine: Negative Sinuses/Orbits: Clear/normal Other: None IMPRESSION: No acute or reversible finding. Generalized brain atrophy. Old left basal ganglia and radiating white matter infarctions. Old cortical infarctions of the left temporal lobe. Small old cortical infarction at the left frontoparietal junction. Electronically Signed   By: Paulina Fusi M.D.   On: 11/07/2019 18:13   US Carotid Bilateral (at [facility] and [facility] only)  Result Date: 11/08/2019 CLINICAL DATA:  TIA.  History of hypertension and diabetes. EXAM: BILATERAL CAROTID DUPLEX ULTRASOUND TECHNIQUE: Wallace Cullens scale imaging, color Doppler and duplex ultrasound were performed of bilateral carotid and vertebral arteries in the neck. COMPARISON:  None. FINDINGS: Criteria: Quantification of carotid stenosis is based on velocity parameters that correlate the residual internal carotid diameter with NASCET-based stenosis levels, using the diameter of the distal internal carotid lumen as the denominator for stenosis measurement. The following velocity measurements were obtained: RIGHT ICA: 75/22 cm/sec CCA: 70/11 cm/sec SYSTOLIC ICA/CCA RATIO:  1.0 ECA: 82 cm/sec LEFT ICA: 83/23 cm/sec CCA: 69/11 cm/sec SYSTOLIC ICA/CCA RATIO:  1.2 ECA: 79 cm/sec RIGHT CAROTID ARTERY: There is a minimal amount of intimal wall thickening seen throughout the right carotid artery. There is a minimal amount of eccentric mixed echogenic plaque within the right carotid bulb, extending to  involve the origin and proximal aspects of the right internal carotid artery (image 35), not resulting in elevated peak systolic velocities within the interrogated course the right internal carotid artery to suggest a hemodynamically significant stenosis. RIGHT VERTEBRAL ARTERY:  Antegrade flow LEFT CAROTID ARTERY: There is a minimal amount of intimal thickening seen throughout the left common carotid artery. There is a minimal amount of eccentric mixed echogenic plaque within the left carotid bulb (image 71 and 73), extending to involve the origin and proximal aspects of the left internal carotid artery (image 87), not resulting in elevated peak systolic velocities within the interrogated course the left internal carotid artery to suggest a hemodynamically significant stenosis. LEFT VERTEBRAL ARTERY:  Antegrade flow IMPRESSION: Minimal amount of bilateral atherosclerotic plaque, left subjectively greater than right, not resulting in a hemodynamically significant stenosis within either internal carotid artery. Electronically Signed   By: Simonne Come M.D.   On: 11/08/2019 07:31   DG Chest Portable 1 View  Result Date: 11/07/2019 CLINICAL DATA:  Weakness.  Recurrent falls. EXAM: PORTABLE CHEST 1 VIEW COMPARISON:  PA and lateral chest 09/03/2018. FINDINGS: Lungs clear. Heart size normal. Aortic atherosclerosis. No pneumothorax or pleural fluid. No acute or focal bony abnormality. IMPRESSION: No acute disease. Atherosclerosis. Electronically Signed   By: Drusilla Kanner M.D.   On: 11/07/2019 13:48   DG Foot Complete Right  Result Date: 11/18/2019 CLINICAL DATA:  Right foot swelling and redness. No known injury. Type 2 diabetes. EXAM: RIGHT FOOT COMPLETE -  3+ VIEW COMPARISON:  09/03/2018 and right foot MR dated 09/04/2018. FINDINGS: Moderate degenerative changes at the 1st MTP joint. Moderate posterior and inferior calcaneal enthesophyte formation. Mild diffuse dorsal soft tissue swelling, most pronounced  distally. No soft tissue gas, bone destruction, periosteal reaction or fracture seen. IMPRESSION: 1. Mild diffuse dorsal soft tissue swelling without underlying bony abnormality. 2. Moderate degenerative changes at the 1st MTP joint. Electronically Signed   By: Beckie Salts M.D.   On: 11/18/2019 14:55   ECHOCARDIOGRAM COMPLETE  Result Date: 11/07/2019   ECHOCARDIOGRAM REPORT   Patient Name:   DEEANNE DEININGER Date of Exam: 11/07/2019 Medical Rec #:  [id number]         Height:       67.0 in Accession #:    [id number]        Weight:       112.0 lb Date of Birth:  15-Dec-1940         BSA:          1.58 m Patient Age:    78 years          BP:           148/103 mmHg Patient Gender: F                 HR:           91 bpm. [facility]am [name] Procedure: 2D Echo Indications:    TIA 435.9 / G45.9  History:        Patient has prior history of Echocardiogram examinations, most                 recent 06/14/2015. TIA and Stroke, Arrythmias:Atrial                 Fibrillation; Risk Factors:Diabetes, Hypertension, Non-Smoker                 and Dyslipidemia. Chronic anticoagulation.  Sonographer:    Jeryl Columbia RDCS (AE) Referring Phys: [id number] CARLOS MADERA IMPRESSIONS  1. Left ventricular ejection fraction, by visual estimation, is 60 to 65%. The left ventricle has normal function. There is mildly increased left ventricular hypertrophy.  2. Left ventricular diastolic parameters are indeterminate.  3. The left ventricle has no regional wall motion abnormalities.  4. Global right ventricle has normal systolic function.The right ventricular size is normal. No increase in right ventricular wall thickness.  5. Left atrial size was normal.  6. Right atrial size was normal.  7. Mild mitral annular calcification.  8. The mitral valve is grossly normal. Trivial mitral valve regurgitation.  9. The tricuspid valve is grossly normal. Tricuspid valve regurgitation is trivial. 10. The aortic valve is tricuspid. Aortic valve  regurgitation is not visualized. 11. The pulmonic valve was not well visualized. Pulmonic valve regurgitation is not visualized. 12. The inferior vena cava is normal in size with greater than 50% respiratory variability, suggesting right atrial pressure of 3 mmHg. FINDINGS  Left Ventricle: Left ventricular ejection fraction, by visual estimation, is 60 to 65%. The left ventricle has normal function. The left ventricle has no regional wall motion abnormalities. The left ventricular internal cavity size was the left ventricle is normal in size. There is mildly increased left ventricular hypertrophy. Concentric left ventricular hypertrophy. Left ventricular diastolic parameters are indeterminate. Right Ventricle: The right ventricular size is normal. No increase in right ventricular wall thickness. Global RV systolic function is has normal systolic function. Left Atrium: Left atrial size was  normal in size. Right Atrium: Right atrial size was normal in size Pericardium: There is no evidence of pericardial effusion. Mitral Valve: The mitral valve is grossly normal. Mild mitral annular calcification. Trivial mitral valve regurgitation. Tricuspid Valve: The tricuspid valve is grossly normal. Tricuspid valve regurgitation is trivial. Aortic Valve: The aortic valve is tricuspid. Aortic valve regurgitation is not visualized. Pulmonic Valve: The pulmonic valve was not well visualized. Pulmonic valve regurgitation is not visualized. Pulmonic regurgitation is not visualized. Aorta: The aortic root is normal in size and structure. Venous: The inferior vena cava is normal in size with greater than 50% respiratory variability, suggesting right atrial pressure of 3 mmHg. IAS/Shunts: No atrial level shunt detected by color flow Doppler.  LEFT VENTRICLE PLAX 2D LVIDd:         3.13 cm  Diastology LVIDs:         2.31 cm  LV e' lateral:   8.16 cm/s LV PW:         1.47 cm  LV E/e' lateral: 12.0 LV IVS:        1.13 cm  LV e' medial:     8.05 cm/s LVOT diam:     1.80 cm  LV E/e' medial:  12.1 LV SV:         20 ml LV SV Index:   13.33 LVOT Area:     2.54 cm  RIGHT VENTRICLE RV S prime:     8.27 cm/s TAPSE (M-mode): 1.9 cm LEFT ATRIUM             Index       RIGHT ATRIUM           Index LA diam:        4.20 cm 2.66 cm/m  RA Area:     11.60 cm LA Vol (A2C):   55.8 ml 35.30 ml/m RA Volume:   23.70 ml  14.99 ml/m LA Vol (A4C):   46.7 ml 29.55 ml/m LA Biplane Vol: 52.0 ml 32.90 ml/m   AORTA Ao Root diam: 2.90 cm MITRAL VALVE MV Area (PHT): 4.04 cm            SHUNTS MV PHT:        54.52 msec          Systemic Diam: 1.80 cm MV Decel Time: 188 msec MV E velocity: 97.53 cm/s 103 cm/s  Kate Sable MD Electronically signed by Kate Sable MD Signature Date/Time: 11/07/2019/3:19:37 PM    Final    CT HEAD CODE STROKE WO CONTRAST  Result Date: 11/28/2019 CLINICAL DATA:  Code stroke. Acute presentation with weakness in the legs and confusion. EXAM: CT HEAD WITHOUT CONTRAST TECHNIQUE: Contiguous axial images were obtained from the base of the skull through the vertex without intravenous contrast. COMPARISON:  MRI 11/07/2019 FINDINGS: Brain: Generalized atrophy. 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 Chronic small-vessel ischemic changes of the white matter. Old small left posterior frontal cortical infarction. No sign of acute infarction, mass lesion, hemorrhage, hydrocephalus or extra-axial collection. Vascular: There is atherosclerotic calcification of the major vessels at the base of the brain. Skull: Negative Sinuses/Orbits: Chronic opacification of the left division the sphenoid sinus. Other sinuses clear orbits negative Other: None ASPECTS ([place] Stroke Program Early CT Score) - Ganglionic level infarction (caudate, lentiform nuclei, internal capsule, insula, M1-M3 cortex): 7 - Supraganglionic infarction (M4-M6 cortex): 3 Total score (0-10 with 10 being normal): 10 IMPRESSION: 1. No  acute finding by  CT. Old infarctions in the left MCA territory affecting the basal ganglia, temporal lobe and posterior frontal lobe. 2. ASPECTS is 10, allowing for the chronic findings. 3. These results were called by telephone at the time of interpretation on 11/28/2019 at 7:26 pm to [facility] , who verbally acknowledged these results. Electronically Signed   By: Paulina FusiMark  Shogry M.D.   On: 11/28/2019 19:27     Discharge Exam: Vitals:   12/02/19 0552 12/02/19 0918  BP:    Pulse:    Resp:    Temp: 97.9 F (36.6 C)   SpO2:  98%   Vitals:   12/01/19 2109 12/02/19 0551 12/02/19 0552 12/02/19 0918  BP: 139/90 137/90    Pulse: 93 89    Resp: 16     Temp: 97.7 F (36.5 C) (!) 97.5 F (36.4 C) 97.9 F (36.6 C)   TempSrc: Oral Oral    SpO2: 99% 99%  98%  Weight:      Height:        General: Pt is currently somnolent Cardiovascular: RRR, S1/S2 +, no rubs, no gallops Respiratory: CTA bilaterally, no wheezing, no rhonchi Abdominal: Soft, NT, ND, bowel sounds + Extremities: no edema, no cyanosis    The results of significant diagnostics from this hospitalization (including imaging, microbiology, ancillary and laboratory) are listed below for reference.     Microbiology: Recent Results (from the past 240 hour(s))  SARS CORONAVIRUS 2 (TAT 6-24 HRS) Nasopharyngeal Nasopharyngeal Swab     Status: None   Collection Time: 11/28/19  9:55 PM   Specimen: Nasopharyngeal Swab  Result Value Ref Range Status   SARS Coronavirus 2 NEGATIVE NEGATIVE Final    Comment: (NOTE) SARS-CoV-2 target nucleic acids are NOT DETECTED. The SARS-CoV-2 RNA is generally detectable in upper and lower respiratory specimens during the acute phase of infection. Negative results do not preclude SARS-CoV-2 infection, do not rule out co-infections with other pathogens, and should not be used as the sole basis for treatment or other patient management decisions. Negative results must be combined with  clinical observations, patient history, and epidemiological information. The expected result is Negative. Fact Sheet for Patients: HairSlick.nohttps://www.fda.gov/media/138098/download Fact Sheet for Healthcare Providers: quierodirigir.comhttps://www.fda.gov/media/138095/download This test is not yet approved or cleared by the [place] FDA and  has been authorized for detection and/or diagnosis of SARS-CoV-2 by FDA under an Emergency Use Authorization (EUA). This EUA will remain  in effect (meaning this test can be used) for the duration of the COVID-19 declaration under Section 56 4(b)(1) of the Act, 21 U.S.C. section 360bbb-3(b)(1), unless the authorization is terminated or revoked sooner. Performed at [facility], [address] [place], [place] [address]      Labs: BNP (last 3 results) No results for input(s): BNP in the last 8760 hours. Basic Metabolic Panel: Recent Labs  Lab 11/28/19 1926 11/28/19 1935 11/29/19 1230 11/30/19 0740  NA 131* 132* 135 137  K 5.0 5.1 4.7 4.3  CL 96* 98 100 99  CO2 22  --  26 25  GLUCOSE 202* 200* 145* 112*  BUN 28* 28* 22 30*  CREATININE 1.30* 1.20* 1.03* 1.09*  CALCIUM 9.4  --  9.2 9.8   Liver Function Tests: Recent Labs  Lab 11/28/19 1926  AST 15  ALT 10  ALKPHOS 76  BILITOT 0.9  PROT 7.2  ALBUMIN 3.9   No results for input(s): LIPASE, AMYLASE in the last 168 hours. No results for input(s): AMMONIA in the last 168  hours. CBC: Recent Labs  Lab 11/28/19 1926 11/28/19 1935 11/30/19 0740  WBC 9.4  --  5.4  NEUTROABS 7.8*  --   --   HGB 13.2 13.9 12.5  HCT 41.3 41.0 39.4  MCV 90.4  --  91.6  PLT 230  --  197   Cardiac Enzymes: No results for input(s): CKTOTAL, CKMB, CKMBINDEX, TROPONINI in the last 168 hours. BNP: Invalid input(s): POCBNP CBG: Recent Labs  Lab 11/30/19 1640 11/30/19 2042 12/01/19 0720 12/01/19 1106 12/01/19 2114  GLUCAP 148* 136* 127* 151* 160*   D-Dimer No results for input(s): DDIMER in the last 72  hours. Hgb A1c No results for input(s): HGBA1C in the last 72 hours. Lipid Profile No results for input(s): CHOL, HDL, LDLCALC, TRIG, CHOLHDL, LDLDIRECT in the last 72 hours. Thyroid function studies No results for input(s): TSH, T4TOTAL, T3FREE, THYROIDAB in the last 72 hours.  Invalid input(s): FREET3 Anemia work up No results for input(s): VITAMINB12, FOLATE, FERRITIN, TIBC, IRON, RETICCTPCT in the last 72 hours. Urinalysis    Component Value Date/Time   COLORURINE YELLOW 11/28/2019 1955   APPEARANCEUR CLEAR 11/28/2019 1955   LABSPEC 1.018 11/28/2019 1955   PHURINE 6.0 11/28/2019 1955   GLUCOSEU 50 (A) 11/28/2019 1955   HGBUR NEGATIVE 11/28/2019 1955   BILIRUBINUR NEGATIVE 11/28/2019 1955   KETONESUR 5 (A) 11/28/2019 1955   PROTEINUR NEGATIVE 11/28/2019 1955   UROBILINOGEN 0.2 06/13/2015 1141   NITRITE NEGATIVE 11/28/2019 1955   LEUKOCYTESUR NEGATIVE 11/28/2019 1955   Sepsis Labs Invalid input(s): PROCALCITONIN,  WBC,  LACTICIDVEN Microbiology Recent Results (from the past 240 hour(s))  SARS CORONAVIRUS 2 (TAT 6-24 HRS) Nasopharyngeal Nasopharyngeal Swab     Status: None   Collection Time: 11/28/19  9:55 PM   Specimen: Nasopharyngeal Swab  Result Value Ref Range Status   SARS Coronavirus 2 NEGATIVE NEGATIVE Final    Comment: (NOTE) SARS-CoV-2 target nucleic acids are NOT DETECTED. The SARS-CoV-2 RNA is generally detectable in upper and lower respiratory specimens during the acute phase of infection. Negative results do not preclude SARS-CoV-2 infection, do not rule out co-infections with other pathogens, and should not be used as the sole basis for treatment or other patient management decisions. Negative results must be combined with clinical observations, patient history, and epidemiological information. The expected result is Negative. Fact Sheet for Patients: HairSlick.no Fact Sheet for Healthcare  Providers: quierodirigir.com This test is not yet approved or cleared by the [place] FDA and  has been authorized for detection and/or diagnosis of SARS-CoV-2 by FDA under an Emergency Use Authorization (EUA). This EUA will remain  in effect (meaning this test can be used) for the duration of the COVID-19 declaration under Section 56 4(b)(1) of the Act, 21 U.S.C. section 360bbb-3(b)(1), unless the authorization is terminated or revoked sooner. Performed at [facility], [address] [place], [place] [address]      Time coordinating discharge: 35 minutes  SIGNED:   Erick Blinks, DO [facility] 12/02/2019, 12:21 PM  If 7PM-7AM, please contact night-coverage [email or web address]

## 2019-12-02 NOTE — Evaluation (Signed)
Occupational Therapy Evaluation Patient Details Name: Holly Sparks MRN: [id number] DOB: 06/05/1941 Today's Date: 12/02/2019    History of Present Illness Holly Sparks is a 79 y.o. 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 She has DM, HTN HLD, and a. Fib. She was last admitted 11/07/19 for right UE weakness with complete resolution of symptoms in the ED. Her eval include Carotid dopplers, 2 D echo and MRI brain. She was in her usual state of declining health and decreasing mobility until today when by her husband's report she had increased weakness, possibly favoring the right and he had great difficulty in helping her mobilize. EMS was called and she was brought to [facility] ED for evaluation. She denies pain, focal weakness or other major changes that she can recall.   Clinical Impression   Evaluation limited due to MRI transport arriving. Patient eating breakfast upon therapy arrival and agreeable to participate in OT evaluation. Patient reports that she was recently in the hospital although shortly upon returning home, she developed increased weakness and could not walk even with assistance. Today, patient presents with slow labored movement, decreased safety awareness, decreased BUE strength and endurance requiring increased assistance to complete ADL tasks and increased risk for falls. Pt will benefit from discharge to SNF prior to returning home to focus on mentioned deficits.     Follow Up Recommendations  SNF    Equipment Recommendations  None recommended by OT       Precautions / Restrictions Precautions Precautions: Fall      Mobility Bed Mobility Overal bed mobility: Needs Assistance Bed Mobility: Supine to Sit     Supine to sit: Min assist     General bed mobility comments: slow labored movement  Transfers     General transfer comment: Transfer not completed.        ADL either performed or  assessed with clinical judgement   ADL Overall ADL's : Needs assistance/impaired Eating/Feeding: Set up;Supervision/ safety;Sitting   Grooming: Supervision/safety;Set up;Wash/dry face;Wash/dry hands;Brushing hair;Sitting   Upper Body Bathing: Moderate assistance;Sitting   Lower Body Bathing: Maximal assistance;Bed level   Upper Body Dressing : Minimal assistance;Sitting   Lower Body Dressing: Total assistance;Bed level                 General ADL Comments: Transfer not complete due to MRI arriving. Patient transfered with PT during PT evaluation at [facility] with RW.     Vision Baseline Vision/History: No visual deficits Patient Visual Report: No change from baseline Vision Assessment?: No apparent visual deficits            Pertinent Vitals/Pain Pain Assessment: No/denies pain     Hand Dominance Right   Extremity/Trunk Assessment Upper Extremity Assessment Upper Extremity Assessment: Generalized weakness   Lower Extremity Assessment Lower Extremity Assessment: Defer to PT evaluation   Cervical / Trunk Assessment Cervical / Trunk Assessment: Normal   Communication Communication Communication: No difficulties;Other (comment)(Low voice with gargle sound in back of throat.)   Cognition Arousal/Alertness: Awake/alert Behavior During Therapy: WFL for tasks assessed/performed Overall Cognitive Status: Within Functional Limits for tasks assessed                  Home Living Family/patient expects to be discharged to:: Skilled nursing facility Living Arrangements: Spouse/significant other Available Help at Discharge: Family;Home health;Available PRN/intermittently(aide: 5 days a week for 5 hours to assist with ADL tasks.) Type of Home: [place]  Access: Stairs to enter [employer] of Steps: 2 Entrance Stairs-Rails: None Home Layout: One level     Bathroom Shower/Tub: Producer, television/film/video: Standard     Home Equipment: Environmental consultant  - 2 wheels;Cane - single point;Shower seat - built in          Prior Functioning/Environment Level of Independence: Needs assistance  Gait / Transfers Assistance Needed: pt uses RW for mobility, household ambulation stated ADL's / Homemaking Assistance Needed: Pt has aide 5hrs/day 5 days/week for ADL assistance in AM. Husband assists with the rest of the time.            OT Problem List: Decreased activity tolerance;Decreased strength                          AM-PAC OT "6 Clicks" Daily Activity     Outcome Measure Help from another person eating meals?: A Little Help from another person taking care of personal grooming?: A Little Help from another person toileting, which includes using toliet, bedpan, or urinal?: A Lot Help from another person bathing (including washing, rinsing, drying)?: A Lot Help from another person to put on and taking off regular upper body clothing?: A Little Help from another person to put on and taking off regular lower body clothing?: A Lot 6 Click Score: 15   End of Session    Activity Tolerance: Patient tolerated treatment well Patient left: in bed;with call bell/phone within reach;with bed alarm set;Other (comment)(MRI transport)  OT Visit Diagnosis: Muscle weakness (generalized) (M62.81)                Time: 8144-8185 OT Time Calculation (min): 10 min Charges:  OT General Charges $OT Visit: 1 Visit OT Evaluation $OT Eval Low Complexity: 1 Low  Limmie Patricia, OTR/L,CBIS  [phone]   Mumin Denomme, Charisse March 12/02/2019, 8:38 AM

## 2019-12-02 NOTE — NC FL2 (Signed)
[place] LEVEL OF CARE SCREENING TOOL     IDENTIFICATION  Patient Name: Holly Sparks Birthdate: 01-15-1941 Sex: female Admission Date (Current Location): 11/28/2019  [facility] and [place] Number:  [employer] and Address:  [place] [address], [place]      Provider Number: [id number]  Attending Physician Name and Address:  Rodena Goldmann, DO  Relative Name and Phone Number:       Current Level of Care: Hospital Recommended Level of Care: [place] Prior Approval Number:    Date Approved/Denied:   PASRR Number: [id number]  Discharge Plan: SNF    Current Diagnoses: Patient Active Problem List   Diagnosis Date Noted  . Adult failure to thrive 11/28/2019  . Atrial fibrillation with RVR ([place])   . Hypokalemia   . Right arm weakness   . TIA (transient ischemic attack) 11/07/2019  . Diabetic ulcer of right foot due to type 2 diabetes mellitus ([place]) 09/03/2018  . Grade I diastolic dysfunction 30/86/5784  . Hypomagnesemia 09/03/2018  . Paroxysmal atrial fibrillation ([place]) 08/31/2015  . Chronic anticoagulation 08/31/2015  . Type 2 diabetes mellitus with circulatory disorder ([place]) 08/31/2015  . Left middle cerebral artery stroke ([place]) 06/16/2015  . Cerebral infarction due to embolism of left middle cerebral artery ([place])   . Stroke-like symptoms 06/13/2015  . HLD (hyperlipidemia) 06/13/2015  . DDD (degenerative disc disease), cervical 06/13/2015  . Aphasia 06/13/2015  . Expressive aphasia   . Cerebral infarction due to unspecified mechanism   . Type 2 diabetes mellitus without complication ([place])   . IBS (irritable bowel syndrome) 04/24/2013  . Diabetes ([place]) 04/24/2013  . Essential hypertension, benign 04/24/2013    Orientation RESPIRATION BLADDER Height & Weight     Self, Time, Situation, Place  Normal Continent Weight: 128 lb (58.1 kg) Height:  5\' 7"  (170.2 cm)  BEHAVIORAL SYMPTOMS/MOOD  NEUROLOGICAL BOWEL NUTRITION STATUS      Continent Diet(heart healthy/carb modified)  AMBULATORY STATUS COMMUNICATION OF NEEDS Skin   Limited Assist Verbally Normal                       Personal Care Assistance Level of Assistance  Bathing, Dressing, Feeding Bathing Assistance: Limited assistance Feeding assistance: Independent Dressing Assistance: Limited assistance     Functional Limitations Info  Sight, Hearing, Speech Sight Info: Adequate Hearing Info: Adequate Speech Info: Adequate    SPECIAL CARE FACTORS FREQUENCY  PT (By licensed PT)     PT Frequency: 5x/week              Contractures Contractures Info: Not present    Additional Factors Info  Code Status, Allergies Code Status Info: DNR Allergies Info: NKA           Current Medications (12/02/2019):  This is the current hospital active medication list Current Facility-Administered Medications  Medication Dose Route Frequency Provider Last Rate Last Admin  . 0.9 %  sodium chloride infusion  250 mL Intravenous PRN Norins, Heinz Knuckles, MD      . acetaminophen (TYLENOL) tablet 650 mg  650 mg Oral Q6H PRN Norins, Heinz Knuckles, MD      . apixaban (ELIQUIS) tablet 5 mg  5 mg Oral BID Norins, Heinz Knuckles, MD   5 mg at 12/02/19 1038  . atorvastatin (LIPITOR) tablet 20 mg  20 mg Oral QHS Norins, Heinz Knuckles, MD   20 mg at 12/01/19 2145  . diltiazem (CARDIZEM CD)  24 hr capsule 120 mg  120 mg Oral Daily Norins, Rosalyn Gess, MD   120 mg at 12/02/19 1038  . docusate sodium (COLACE) capsule 100 mg  100 mg Oral BID Jacques Navy, MD   100 mg at 12/02/19 1039  . feeding supplement (ENSURE ENLIVE) (ENSURE ENLIVE) liquid 237 mL  237 mL Oral BID BM Shah, Pratik D, DO   237 mL at 12/02/19 1039  . feeding supplement (PRO-STAT SUGAR FREE 64) liquid 30 mL  30 mL Oral BID BM Norins, Rosalyn Gess, MD   30 mL at 12/02/19 1038  . insulin aspart (novoLOG) injection 0-5 Units  0-5 Units Subcutaneous QHS Shah, Pratik D, DO      . insulin  aspart (novoLOG) injection 0-9 Units  0-9 Units Subcutaneous TID WC Shah, Pratik D, DO   1 Units at 12/02/19 0755  . lisinopril (ZESTRIL) tablet 20 mg  20 mg Oral Daily Norins, Rosalyn Gess, MD   20 mg at 12/02/19 1039  . magnesium oxide (MAG-OX) tablet 400 mg  400 mg Oral Daily Norins, Rosalyn Gess, MD   400 mg at 12/02/19 1038  . metFORMIN (GLUCOPHAGE) tablet 500 mg  500 mg Oral BID WC Norins, Rosalyn Gess, MD   500 mg at 12/02/19 0756  . metoprolol tartrate (LOPRESSOR) tablet 12.5 mg  12.5 mg Oral BID Jacques Navy, MD   12.5 mg at 12/02/19 1038  . ondansetron (ZOFRAN) tablet 4 mg  4 mg Oral Q6H PRN Norins, Rosalyn Gess, MD      . pantoprazole (PROTONIX) EC tablet 40 mg  40 mg Oral Daily Norins, Rosalyn Gess, MD   40 mg at 12/02/19 1039  . sodium chloride flush (NS) 0.9 % injection 3 mL  3 mL Intravenous Q12H Norins, Rosalyn Gess, MD   3 mL at 12/02/19 1039  . sodium chloride flush (NS) 0.9 % injection 3 mL  3 mL Intravenous PRN Norins, Rosalyn Gess, MD         Discharge Medications: Please see discharge summary for a list of discharge medications.  Relevant Imaging Results:  Relevant Lab Results:   Additional Information SSN: [id number] Juleen China, LCSW

## 2019-12-02 NOTE — TOC Initial Note (Signed)
Transition of Care [facility]) - Initial/Assessment Note    Patient Details  Name: Holly Sparks MRN: [id number] Date of Birth: 11/16/1941  [facility]) CM/SW Contact:    Annice Needy, LCSW Phone Number: 12/02/2019, 1:14 PM  Clinical Narrative:                 Patient from home with spouse. Admitted for Acute encephalopathy with generalized weakness in the setting of likely recurrent small vessel ischemic strokes. At baseline she ambulates with a cane (has a walker in the home), does some driving and in independent with ADLs.  PT eval and recommendations discussed. Patient is agreeable to SNF. Choices provided from [facility] website. Facilities accepting patients vs facilites not accepting patient due to COVID outbreaks were discussed.  Referrals sent.    Expected Discharge Plan: Skilled Nursing Facility Barriers to Discharge: Continued Medical Work up   Patient Goals and CMS Choice Patient states their goals for this hospitalization and ongoing recovery are:: To go to SNF.      Expected Discharge Plan and Services Expected Discharge Plan: Skilled Nursing Facility       Living arrangements for the past 2 months: Single Family Home Expected Discharge Date: 12/02/19                                    Prior Living Arrangements/Services Living arrangements for the past 2 months: Single Family Home Lives with:: Spouse              Current home services: DME    Activities of Daily Living Home Assistive Devices/Equipment: Cane (specify quad or straight), Walker (specify type), Blood pressure cuff, Bedside commode/3-in-1, Eyeglasses, Shower chair with back ADL Screening (condition at time of admission) Patient's cognitive ability adequate to safely complete daily activities?: Yes Is the patient deaf or have difficulty hearing?: No Does the patient have difficulty seeing, even when wearing glasses/contacts?: No Does the patient have difficulty  concentrating, remembering, or making decisions?: No Patient able to express need for assistance with ADLs?: Yes Does the patient have difficulty dressing or bathing?: Yes Independently performs ADLs?: No Communication: Independent Dressing (OT): Needs assistance Is this a change from baseline?: Pre-admission baseline Grooming: Needs assistance Is this a change from baseline?: Pre-admission baseline Feeding: Needs assistance Is this a change from baseline?: Pre-admission baseline Bathing: Needs assistance Is this a change from baseline?: Pre-admission baseline Toileting: Needs assistance Is this a change from baseline?: Pre-admission baseline In/Out Bed: Dependent Is this a change from baseline?: Pre-admission baseline Walks in Home: Needs assistance Is this a change from baseline?: Pre-admission baseline Does the patient have difficulty walking or climbing stairs?: Yes Weakness of Legs: Both Weakness of Arms/Hands: Both  Permission Sought/Granted                  Emotional Assessment Appearance:: Appears stated age Attitude/Demeanor/Rapport: Engaged Affect (typically observed): Appropriate Orientation: : Oriented to Self, Oriented to Place, Oriented to  Time, Oriented to Situation Alcohol / Substance Use: Not Applicable Psych Involvement: No (comment)  Admission diagnosis:  Acute right-sided weakness [R53.1] Stroke-like symptoms [R29.90] Patient Active Problem List   Diagnosis Date Noted  . Adult failure to thrive 11/28/2019  . Atrial fibrillation with RVR (H[facility])   . Hypokalemia   . Right arm weakness   . TIA (transient ischemic attack) 11/07/2019  . Diabetic ulcer of right foot due to type 2 diabetes  mellitus ([place]) 09/03/2018  . Grade I diastolic dysfunction 40/37/5436  . Hypomagnesemia 09/03/2018  . Paroxysmal atrial fibrillation ([place]) 08/31/2015  . Chronic anticoagulation 08/31/2015  . Type 2 diabetes mellitus with circulatory disorder ([place]) 08/31/2015  . Left  middle cerebral artery stroke ([place]) 06/16/2015  . Cerebral infarction due to embolism of left middle cerebral artery ([place])   . Stroke-like symptoms 06/13/2015  . HLD (hyperlipidemia) 06/13/2015  . DDD (degenerative disc disease), cervical 06/13/2015  . Aphasia 06/13/2015  . Expressive aphasia   . Cerebral infarction due to unspecified mechanism   . Type 2 diabetes mellitus without complication ([place])   . IBS (irritable bowel syndrome) 04/24/2013  . Diabetes ([place]) 04/24/2013  . Essential hypertension, benign 04/24/2013   PCP:  Lemmie Evens, MD Pharmacy:   [facility] [place] [address] Phone: [phone] Fax: [phone]  [facility] [place] [address] Phone: [phone] Fax: [phone]     Social Determinants of Health (SDOH) Interventions    Readmission Risk Interventions No flowsheet data found.

## 2019-12-03 ENCOUNTER — Encounter (HOSPITAL_COMMUNITY): Payer: Self-pay | Admitting: Internal Medicine

## 2019-12-03 DIAGNOSIS — I10 Essential (primary) hypertension: Secondary | ICD-10-CM

## 2019-12-03 DIAGNOSIS — E1159 Type 2 diabetes mellitus with other circulatory complications: Secondary | ICD-10-CM

## 2019-12-03 DIAGNOSIS — I480 Paroxysmal atrial fibrillation: Secondary | ICD-10-CM

## 2019-12-03 DIAGNOSIS — R531 Weakness: Secondary | ICD-10-CM

## 2019-12-03 LAB — RESPIRATORY PANEL BY RT PCR (FLU A&B, COVID)
Influenza A by PCR: NEGATIVE
Influenza B by PCR: NEGATIVE
SARS Coronavirus 2 by RT PCR: NEGATIVE

## 2019-12-03 NOTE — TOC Progression Note (Signed)
Transition of Care [facility]) - Progression Note    Patient Details  Name: Holly Sparks MRN: [id number] Date of Birth: May 11, 1941  Transition of Care [facility]) CM/SW Contact  Rutledge Selsor, Chrystine Oiler, RN Phone Number: 12/03/2019, 11:19 AM  Clinical Narrative:  Discussed bed offer with patient and husband. Husband wants to see patient work with PT again today before he makes his final decision.  He is interested in [facility] if patient still needs  SNF.     Expected Discharge Plan: Skilled Nursing Facility Barriers to Discharge: Continued Medical Work up  Expected Discharge Plan and Services Expected Discharge Plan: Skilled Nursing Facility       Living arrangements for the past 2 months: Single Family Home Expected Discharge Date: 12/02/19                                     Social Determinants of Health (SDOH) Interventions    Readmission Risk Interventions No flowsheet data found.

## 2019-12-03 NOTE — Progress Notes (Signed)
Physical Therapy Treatment Patient Details Name: Holly Sparks MRN: [id number] DOB: 1940/12/13 Today's Date: 12/03/2019    History of Present Illness Holly Sparks is a 79 y.o. 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 She has DM, HTN HLD, and a. Fib. She was last admitted 11/07/19 for right UE weakness with complete resolution of symptoms in the ED. Her eval include Carotid dopplers, 2 D echo and MRI brain. She was in her usual state of declining health and decreasing mobility until today when by her husband's report she had increased weakness, possibly favoring the right and he had great difficulty in helping her mobilize. EMS was called and she was brought to [facility] ED for evaluation. She denies pain, focal weakness or other major changes that she can recall.    PT Comments    Patient requires mod assist to transition from supine to seated along with verbal cueing for sequencing and hand placement. She is able to transtion from seated EOB to standing with mod assist with RW. Patient able to take few small steps in room with RW and assist with c/o knee pain. She requires frequent verbal cueing for sequencing of steps and for moving RW. Patient performs most tasks very slowly today and continues to demonstrate impaired activity tolerance and weakness. Patient will benefit from continued physical therapy in hospital and recommended venue below to increase strength, balance, endurance for safe ADLs and gait.   Follow Up Recommendations  SNF     Equipment Recommendations  None recommended by PT    Recommendations for Other Services       Precautions / Restrictions Precautions Precautions: Fall Restrictions Weight Bearing Restrictions: No    Mobility  Bed Mobility Overal bed mobility: Needs Assistance Bed Mobility: Supine to Sit;Sit to Supine     Supine to sit: Min assist;Mod assist Sit to supine: Min assist;Mod  assist   General bed mobility comments: slow labored movement, required assist to upright trunk and for LE movement off bed, frequent verbal cueing to scoot forward to rest feet on ground with very slow carry over  Transfers Overall transfer level: Needs assistance Equipment used: Rolling walker (2 wheeled) Transfers: Sit to/from [facility] Sit to Stand: Mod assist Stand pivot transfers: Mod assist       General transfer comment: increased time labored movement, patient requires mod assist to transition to standing as well as verbal cueing for hand placement on RW  Ambulation/Gait Ambulation/Gait assistance: Min assist Gait Distance (Feet): 10 Feet Assistive device: Rolling walker (2 wheeled) Gait Pattern/deviations: Decreased step length - right;Decreased step length - left;Decreased stride length;Narrow base of support Gait velocity: decreased   General Gait Details: slow labored cadence with small shuffled steps, c/o knee pain and fatigue   Stairs             Wheelchair Mobility    Modified Rankin (Stroke Patients Only)       Balance Overall balance assessment: Needs assistance Sitting-balance support: Feet supported;No upper extremity supported Sitting balance-Leahy Scale: Fair Sitting balance - Comments: seated edge of bed   Standing balance support: Bilateral upper extremity supported;During functional activity Standing balance-Leahy Scale: Fair Standing balance comment: using RW and assist, LE trembeling upon initial standing                            Cognition Arousal/Alertness: Awake/alert Behavior During Therapy: [facility]  for tasks assessed/performed Overall Cognitive Status: Within Functional Limits for tasks assessed                                        Exercises      General Comments        Pertinent Vitals/Pain Pain Assessment: No/denies pain    Home Living                      Prior  Function            PT Goals (current goals can now be found in the care plan section) Acute Rehab PT Goals Patient Stated Goal: return home with family to assist PT Goal Formulation: With patient Time For Goal Achievement: 12/14/19 Potential to Achieve Goals: Good Progress towards PT goals: Progressing toward goals    Frequency    Min 3X/week      PT Plan Current plan remains appropriate    Co-evaluation              AM-PAC PT "6 Clicks" Mobility   Outcome Measure  Help needed turning from your back to your side while in a flat bed without using bedrails?: A Little Help needed moving from lying on your back to sitting on the side of a flat bed without using bedrails?: A Little Help needed moving to and from a bed to a chair (including a wheelchair)?: A Little Help needed standing up from a chair using your arms (e.g., wheelchair or bedside chair)?: A Little Help needed to walk in hospital room?: A Lot Help needed climbing 3-5 steps with a railing? : A Lot 6 Click Score: 16    End of Session Equipment Utilized During Treatment: Gait belt Activity Tolerance: Patient tolerated treatment well Patient left: with call bell/phone within reach;in bed;with family/visitor present Nurse Communication: Mobility status PT Visit Diagnosis: Unsteadiness on feet (R26.81);Other abnormalities of gait and mobility (R26.89);Muscle weakness (generalized) (M62.81)     Time: 8588-5027 PT Time Calculation (min) (ACUTE ONLY): 22 min  Charges:  $Therapeutic Activity: 8-22 mins                     2:43 PM, 12/03/19 Wyman Songster PT, DPT Physical Therapist at [facility]

## 2019-12-03 NOTE — Progress Notes (Signed)
Patient seen and examined.  Denies any shortness of breath, chest pain.  No new complaints. Vitals stable.  Lungs are clear bilaterally.  Heart sounds normal.  No lower extremity edema.  Discharge summary done yesterday by Dr. Sherryll Burger reviewed.  No significant changes in medications or hospital course noted.   She was admitted to the hospital with increased weakness, possibly favoring the right and required increased assistance and help with mobilization.  Patient was admitted with strokelike symptoms.  Brain MRI with no new acute findings.  It was thought that she likely had an acute encephalopathy with generalized weakness in the setting of likely recurrent small vessel ischemic strokes.  She was seen by physical therapy with recommendation for skilled nursing facility.  She is felt stable to discharge there today.  [employer]

## 2019-12-03 NOTE — TOC Progression Note (Addendum)
Transition of Care [facility]) - Progression Note    Patient Details  Name: Holly Sparks MRN: [id number] Date of Birth: Mar 20, 1941  Transition of Care [facility]) CM/SW Contact  Arshad Oberholzer, Chrystine Oiler, RN Phone Number: 12/03/2019, 2:51 PM  Clinical Narrative:  Insurance authorization received, reference number is [id number], good for 3 days 1-5 through 1-7. Fax number for continued clinicals is [phone].  ADDENDUM: Patient will need SNF. [facility]. [facility] can no longer take patient today.  Discussed with patient and husband, agreeable to [facility]. They can take patient 1/6 in the morning.     Expected Discharge Plan: Skilled Nursing Facility Barriers to Discharge: Continued Medical Work up  Expected Discharge Plan and Services Expected Discharge Plan: Skilled Nursing Facility       Living arrangements for the past 2 months: Single Family Home Expected Discharge Date: 12/02/19                     Social Determinants of Health (SDOH) Interventions    Readmission Risk Interventions No flowsheet data found.

## 2019-12-04 LAB — GLUCOSE, CAPILLARY
Glucose-Capillary: 155 mg/dL — ABNORMAL HIGH (ref 70–99)
Glucose-Capillary: 122 mg/dL — ABNORMAL HIGH (ref 70–99)
Glucose-Capillary: 138 mg/dL — ABNORMAL HIGH (ref 70–99)
Glucose-Capillary: 188 mg/dL — ABNORMAL HIGH (ref 70–99)
Glucose-Capillary: 114 mg/dL — ABNORMAL HIGH (ref 70–99)
Glucose-Capillary: 158 mg/dL — ABNORMAL HIGH (ref 70–99)
Glucose-Capillary: 127 mg/dL — ABNORMAL HIGH (ref 70–99)
Glucose-Capillary: 127 mg/dL — ABNORMAL HIGH (ref 70–99)
Glucose-Capillary: 129 mg/dL — ABNORMAL HIGH (ref 70–99)
Glucose-Capillary: 205 mg/dL — ABNORMAL HIGH (ref 70–99)

## 2019-12-04 MED ORDER — LACTATED RINGERS IV BOLUS
1000.0000 mL | Freq: Once | INTRAVENOUS | Status: AC
  Administered 2019-12-04: 1000 mL via INTRAVENOUS

## 2019-12-04 NOTE — TOC Transition Note (Addendum)
Transition of Care [facility]) - CM/SW Discharge Note   Patient Details  Name: Holly Sparks MRN: [id number] Date of Birth: 21-Mar-1941  Transition of Care [facility]) CM/SW Contact:  Brannon Levene, Chrystine Oiler, RN Phone Number: 12/04/2019, 12:01 PM   Clinical Narrative:   Patient discharging to [facility] today. DC clinicals sent. Insurance authorization switched to [facility], new reference number [id number]. EMS arranged. Patient updated. Left hippa compliant VM for husband on home answering machine, mobile VM full.     Final next level of care: Skilled Nursing Facility Barriers to Discharge: Barriers Resolved   Patient Goals and CMS Choice Patient states their goals for this hospitalization and ongoing recovery are:: To go to SNF.      Discharge Placement              Patient chooses bed at: [facility] Patient to be transferred to facility by: [facility] Name of family member notified: Reita Cliche Patient and family notified of of transfer: 12/04/19  Discharge Plan and Services                                     Social Determinants of Health (SDOH) Interventions     Readmission Risk Interventions No flowsheet data found.

## 2019-12-04 NOTE — Care Management Important Message (Signed)
Important Message  Patient Details  Name: Holly Sparks MRN: [id number] Date of Birth: [birth year]   Medicare Important Message Given:  Yes(Heather, RN delivered letter to patient)     Corey Harold 12/04/2019, 1:40 PM

## 2019-12-04 NOTE — Discharge Summary (Signed)
Physician Discharge Summary  Lance BoschCatherine P Degrace ZOX:[id number] DOB: 18-Nov-1941 DOA: 11/28/2019  PCP: Gareth MorganKnowlton, Steve, MD  Admit date: 11/28/2019  Discharge date: 12/04/2019  Admitted From:Home  Disposition:  SNF  Recommendations for Outpatient Follow-up:  1. Follow up with PCP in 1-2 weeks 2. Continue on prior home meds as prescribed previously  Home Health: None  Equipment/Devices: None  Discharge Condition: Stable  CODE STATUS: DNR  Diet recommendation: Heart Healthy/carb modified  Brief/Interim Summary: Per HPI: Holly DawleyCatherine P Mooreis a 79 y.o.femalewith medical history significant ofremote CVA resulting in gait problems that require use of a walker. She has DM, HTN HLD, and a. Fib. She was last admitted 11/07/19 for right UE weakness with complete resolution of symptoms in the ED. Her eval include Carotid dopplers, 2 D echo and MRI brain. She was in her usual state of declining health and decreasing mobility until today when by her husband's report she had increased weakness, possibly favoring the right and he had great difficulty in helping her mobilize. EMS was called and she was brought to [facility] ED for evaluation. She denies pain, focal weakness or other major changes that she can recall.  1/1:Patient was admitted with strokelike symptoms with wide differential of possible toxic/metabolic disturbances still a possibility. She was recently discharged on aspirin and Eliquis with TIA at that time. Brain MRI ordered and currently pending. She will need to remain on Eliquis. PT/OT evaluation ordered. Covid testing negative.  1/2:Brain MRI could not be completed as of yet. We will plan to add aspirin 81 mg daily for 2 weeks. She has had recent echocardiogram as well as carotid evaluation on 12/10. She has been seen by PT with recommendations for SNF.  1/3: No acute overnight events noted.  Awaiting SNF placement.  Continue aspirin and Eliquis.  1/4: Patient is stable for  discharge to SNF at this time.  Brain MRI with no new or acute findings.  Will discontinue aspirin have her remain on Eliquis.  She is unsafe to go home.  No other acute events noted throughout the course of this admission.  1/6: patient has been waiting on SNF bed for discharge. 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  Discharge Instructions  Discharge Instructions    Diet - low sodium heart healthy   Complete by: As directed    Diet - low sodium heart healthy   Complete by: As directed    Increase activity slowly   Complete by: As directed    Increase activity slowly   Complete by: As directed      Allergies as of 12/04/2019   No Known Allergies     Medication List    TAKE these medications   acetaminophen 325 MG tablet Commonly known as: TYLENOL Take 650 mg by mouth every 6 (six) hours as needed.   apixaban 5 MG Tabs tablet Commonly known as: ELIQUIS Take 1 tablet (5 mg total) by mouth 2 (two) times daily.   atorvastatin 20 MG tablet Commonly known as: LIPITOR Take 1 tablet (20 mg total) by mouth at bedtime.   diltiazem 120 MG 24 hr capsule Commonly known as:  Cardizem CD Take 1 capsule (120 mg total) by mouth daily.   feeding supplement (PRO-STAT SUGAR FREE 64) Liqd Take 30 mLs by mouth 2 (two) times daily between meals.   lisinopril-hydrochlorothiazide 20-12.5 MG tablet Commonly known as: ZESTORETIC Take 0.5 tablets by mouth daily.   magnesium oxide 400 MG tablet Commonly known as: MAG-OX Take 1 tablet (400 mg total) by mouth daily.   metFORMIN 1000 MG  tablet Commonly known as: GLUCOPHAGE Take 500 mg by mouth 2 (two) times daily with a meal.   metoprolol tartrate 25 MG tablet Commonly known as: LOPRESSOR Take 12.5 mg by mouth 2 (two) times daily.   ondansetron 4 MG tablet Commonly known as: ZOFRAN Take 4 mg by mouth every 6 (six) hours as needed for nausea or vomiting.   pantoprazole 40 MG tablet Commonly known as: PROTONIX Take 40 mg by mouth daily.   potassium chloride 10 MEQ tablet Commonly known as: KLOR-CON Take 2 tablets (20 mEq total) by mouth daily.       Contact information for follow-up providers    Gareth Morgan, MD Follow up in 1 week(s).   Specialty: Family Medicine Contact information: [address] [place] [address] [phone]            Contact information for after-discharge care    Destination    [facility] Preferred SNF .   Service: Skilled Nursing Contact information: [address] [place] [address] [phone]                 No Known Allergies  Consultations:  None   Procedures/Studies: CT HEAD WO CONTRAST  Result Date: 11/07/2019 CLINICAL DATA:  TIA EXAM: CT HEAD WITHOUT CONTRAST TECHNIQUE: Contiguous axial images were obtained from the base of the skull through the vertex without intravenous contrast. COMPARISON:  2016 FINDINGS: Brain: There is no acute intracranial hemorrhage, mass-effect, or edema. There is no acute appearing loss of gray-white differentiation. Chronic infarcts are present involving the left basal ganglia and temporal lobe. Additional patchy hypoattenuation in the supratentorial white matter is nonspecific but probably reflects mild chronic microvascular ischemic changes. There is no extra-axial fluid collection. Prominence of the ventricles and sulci reflects mild parenchymal volume loss. Vascular: There is atherosclerotic calcification at the skull base. Skull: Calvarium is unremarkable. Sinuses/Orbits: Partial  opacification of the left sphenoid sinus with evidence of chronic inflammation. Other: None. IMPRESSION: No acute intracranial hemorrhage, mass effect, or evidence of acute infarction. Chronic/nonemergent findings detailed above. Electronically Signed   By: Guadlupe Spanish M.D.   On: 11/07/2019 12:16   MR BRAIN WO CONTRAST  Result Date: 12/02/2019 CLINICAL DATA:  79 year old female with altered mental status and weakness. Code stroke presentation on 11/28/2019. EXAM: MRI HEAD WITHOUT CONTRAST TECHNIQUE: Multiplanar, multiecho pulse sequences of the brain and surrounding structures were obtained without intravenous contrast. COMPARISON:  Head CT 11/28/2019.  Brain MRI 11/07/2019. FINDINGS: Brain: No restricted diffusion to suggest acute infarction. No midline shift, mass effect, evidence of mass lesion, ventriculomegaly, extra-axial collection or acute intracranial hemorrhage. Cervicomedullary junction and pituitary are within normal limits. Brain volume loss which appears fairly generalized as described last month. Areas of post ischemic appearing encephalomalacia again noted in the posterior left temporal lobe, left corona radiata and external capsule. Hemosiderin associated with the latter. There might also be small areas of chronic cortical encephalomalacia in the right parietal and posterior left frontal lobes on series 9, image 40. overall stable gray and white matter signal from last  month. Vascular: Major intracranial vascular flow voids are stable. Skull and upper cervical spine: Partially visible cervical fusion hardware artifact. Bone marrow signal is stable and within normal limits. Sinuses/Orbits: Stable orbits. Mild left sphenoid sinus mucosal thickening is stable. Other: Mastoids remain clear. Grossly negative internal auditory structures. IMPRESSION: 1.  No acute intracranial abnormality. 2. Noncontrast MRI appearance of the brain remains stable since last month, with a few scattered areas of  chronic ischemia more pronounced in the left hemisphere. Electronically Signed   By: Odessa Fleming M.D.   On: 12/02/2019 09:13   MR BRAIN WO CONTRAST  Result Date: 11/07/2019 CLINICAL DATA:  Right arm weakness beginning today. Fell last week. EXAM: MRI HEAD WITHOUT CONTRAST TECHNIQUE: Multiplanar, multiecho pulse sequences of the brain and surrounding structures were obtained without intravenous contrast. COMPARISON:  Head CT same day FINDINGS: Brain: Diffusion imaging does not show any acute or subacute infarction. No focal insult affects the brainstem or cerebellum. There is some brainstem and cerebellar atrophy. Cerebral hemispheres show generalized atrophy with old infarction in the left basal ganglia and radiating white matter tracts particularly the external capsule. There are old left temporal cortical and subcortical infarctions. There is an old left frontoparietal junction cortical infarction. No mass lesion, hemorrhage, hydrocephalus or extra-axial collection. Vascular: Major vessels at the base of the brain show flow. Skull and upper cervical spine: Negative Sinuses/Orbits: Clear/normal Other: None IMPRESSION: No acute or reversible finding. Generalized brain atrophy. Old left basal ganglia and radiating white matter infarctions. Old cortical infarctions of the left temporal lobe. Small old cortical infarction at the left frontoparietal junction. Electronically Signed   By: Paulina Fusi M.D.   On: 11/07/2019 18:13   US Carotid Bilateral (at [facility] and [facility] only)  Result Date: 11/08/2019 CLINICAL DATA:  TIA.  History of hypertension and diabetes. EXAM: BILATERAL CAROTID DUPLEX ULTRASOUND TECHNIQUE: Wallace Cullens scale imaging, color Doppler and duplex ultrasound were performed of bilateral carotid and vertebral arteries in the neck. COMPARISON:  None. FINDINGS: Criteria: Quantification of carotid stenosis is based on velocity parameters that correlate the residual internal carotid diameter with NASCET-based  stenosis levels, using the diameter of the distal internal carotid lumen as the denominator for stenosis measurement. The following velocity measurements were obtained: RIGHT ICA: 75/22 cm/sec CCA: 70/11 cm/sec SYSTOLIC ICA/CCA RATIO:  1.0 ECA: 82 cm/sec LEFT ICA: 83/23 cm/sec CCA: 69/11 cm/sec SYSTOLIC ICA/CCA RATIO:  1.2 ECA: 79 cm/sec RIGHT CAROTID ARTERY: There is a minimal amount of intimal wall thickening seen throughout the right carotid artery. There is a minimal amount of eccentric mixed echogenic plaque within the right carotid bulb, extending to involve the origin and proximal aspects of the right internal carotid artery (image 35), not resulting in elevated peak systolic velocities within the interrogated course the right internal carotid artery to suggest a hemodynamically significant stenosis. RIGHT VERTEBRAL ARTERY:  Antegrade flow LEFT CAROTID ARTERY: There is a minimal amount of intimal thickening seen throughout the left common carotid artery. There is a minimal amount of eccentric mixed echogenic plaque within the left carotid bulb (image 71 and 73), extending to involve the origin and proximal aspects of the left internal carotid artery (image 87), not resulting in elevated peak systolic velocities within the interrogated course the left internal carotid artery to suggest a hemodynamically significant stenosis. LEFT VERTEBRAL ARTERY:  Antegrade flow IMPRESSION: Minimal amount of bilateral atherosclerotic plaque, left subjectively greater than right, not resulting in a hemodynamically significant stenosis within either internal carotid artery. Electronically Signed  By: Simonne Come M.D.   On: 11/08/2019 07:31   DG Chest Portable 1 View  Result Date: 11/07/2019 CLINICAL DATA:  Weakness.  Recurrent falls. EXAM: PORTABLE CHEST 1 VIEW COMPARISON:  PA and lateral chest 09/03/2018. FINDINGS: Lungs clear. Heart size normal. Aortic atherosclerosis. No pneumothorax or pleural fluid. No acute or focal  bony abnormality. IMPRESSION: No acute disease. Atherosclerosis. Electronically Signed   By: Drusilla Kanner M.D.   On: 11/07/2019 13:48   DG Foot Complete Right  Result Date: 11/18/2019 CLINICAL DATA:  Right foot swelling and redness. No known injury. Type 2 diabetes. EXAM: RIGHT FOOT COMPLETE - 3+ VIEW COMPARISON:  09/03/2018 and right foot MR dated 09/04/2018. FINDINGS: Moderate degenerative changes at the 1st MTP joint. Moderate posterior and inferior calcaneal enthesophyte formation. Mild diffuse dorsal soft tissue swelling, most pronounced distally. No soft tissue gas, bone destruction, periosteal reaction or fracture seen. IMPRESSION: 1. Mild diffuse dorsal soft tissue swelling without underlying bony abnormality. 2. Moderate degenerative changes at the 1st MTP joint. Electronically Signed   By: Beckie Salts M.D.   On: 11/18/2019 14:55   ECHOCARDIOGRAM COMPLETE  Result Date: 11/07/2019   ECHOCARDIOGRAM REPORT   Patient Name:   Holly Sparks Date of Exam: 11/07/2019 Medical Rec #:  [id number]         Height:       67.0 in Accession #:    [id number]        Weight:       112.0 lb Date of Birth:  Jan 02, 1941         BSA:          1.58 m Patient Age:    78 years          BP:           148/103 mmHg Patient Gender: F                 HR:           91 bpm. [name] Procedure: 2D Echo Indications:    TIA 435.9 / G45.9  History:        Patient has prior history of Echocardiogram examinations, most                 recent 06/14/2015. TIA and Stroke, Arrythmias:Atrial                 Fibrillation; Risk Factors:Diabetes, Hypertension, Non-Smoker                 and Dyslipidemia. Chronic anticoagulation.  Sonographer:    Jeryl Columbia RDCS (AE) Referring Phys: [id number] CARLOS MADERA IMPRESSIONS  1. Left ventricular ejection fraction, by visual estimation, is 60 to 65%. The left ventricle has normal function. There is mildly increased left ventricular hypertrophy.  2. Left ventricular diastolic  parameters are indeterminate.  3. The left ventricle has no regional wall motion abnormalities.  4. Global right ventricle has normal systolic function.The right ventricular size is normal. No increase in right ventricular wall thickness.  5. Left atrial size was normal.  6. Right atrial size was normal.  7. Mild mitral annular calcification.  8. The mitral valve is grossly normal. Trivial mitral valve regurgitation.  9. The tricuspid valve is grossly normal. Tricuspid valve regurgitation is trivial. 10. The aortic valve is tricuspid. Aortic valve regurgitation is not visualized. 11. The pulmonic valve was not well visualized. Pulmonic valve regurgitation is not visualized. 12. The inferior vena cava is  normal in size with greater than 50% respiratory variability, suggesting right atrial pressure of 3 mmHg. FINDINGS  Left Ventricle: Left ventricular ejection fraction, by visual estimation, is 60 to 65%. The left ventricle has normal function. The left ventricle has no regional wall motion abnormalities. The left ventricular internal cavity size was the left ventricle is normal in size. There is mildly increased left ventricular hypertrophy. Concentric left ventricular hypertrophy. Left ventricular diastolic parameters are indeterminate. Right Ventricle: The right ventricular size is normal. No increase in right ventricular wall thickness. Global RV systolic function is has normal systolic function. Left Atrium: Left atrial size was normal in size. Right Atrium: Right atrial size was normal in size Pericardium: There is no evidence of pericardial effusion. Mitral Valve: The mitral valve is grossly normal. Mild mitral annular calcification. Trivial mitral valve regurgitation. Tricuspid Valve: The tricuspid valve is grossly normal. Tricuspid valve regurgitation is trivial. Aortic Valve: The aortic valve is tricuspid. Aortic valve regurgitation is not visualized. Pulmonic Valve: The pulmonic valve was not well  visualized. Pulmonic valve regurgitation is not visualized. Pulmonic regurgitation is not visualized. Aorta: The aortic root is normal in size and structure. Venous: The inferior vena cava is normal in size with greater than 50% respiratory variability, suggesting right atrial pressure of 3 mmHg. IAS/Shunts: No atrial level shunt detected by color flow Doppler.  LEFT VENTRICLE PLAX 2D LVIDd:         3.13 cm  Diastology LVIDs:         2.31 cm  LV e' lateral:   8.16 cm/s LV PW:         1.47 cm  LV E/e' lateral: 12.0 LV IVS:        1.13 cm  LV e' medial:    8.05 cm/s LVOT diam:     1.80 cm  LV E/e' medial:  12.1 LV SV:         20 ml LV SV Index:   13.33 LVOT Area:     2.54 cm  RIGHT VENTRICLE RV S prime:     8.27 cm/s TAPSE (M-mode): 1.9 cm LEFT ATRIUM             Index       RIGHT ATRIUM           Index LA diam:        4.20 cm 2.66 cm/m  RA Area:     11.60 cm LA Vol (A2C):   55.8 ml 35.30 ml/m RA Volume:   23.70 ml  14.99 ml/m LA Vol (A4C):   46.7 ml 29.55 ml/m LA Biplane Vol: 52.0 ml 32.90 ml/m   AORTA Ao Root diam: 2.90 cm MITRAL VALVE MV Area (PHT): 4.04 cm            SHUNTS MV PHT:        54.52 msec          Systemic Diam: 1.80 cm MV Decel Time: 188 msec MV E velocity: 97.53 cm/s 103 cm/s  Kate Sable MD Electronically signed by Kate Sable MD Signature Date/Time: 11/07/2019/3:19:37 PM    Final    CT HEAD CODE STROKE WO CONTRAST  Result Date: 11/28/2019 CLINICAL DATA:  Code stroke. Acute presentation with weakness in the legs and confusion. EXAM: CT HEAD WITHOUT CONTRAST TECHNIQUE: Contiguous axial images were obtained from the base of the skull through the vertex without intravenous contrast. COMPARISON:  MRI 11/07/2019 FINDINGS: Brain: Generalized atrophy. Old infarction in the left MCA territory affecting the  basal ganglia and temporal lobe. Chronic small-vessel ischemic changes of the white matter. Old small left posterior frontal cortical infarction. No sign of acute infarction, mass  lesion, hemorrhage, hydrocephalus or extra-axial collection. Vascular: There is atherosclerotic calcification of the major vessels at the base of the brain. Skull: Negative Sinuses/Orbits: Chronic opacification of the left division the sphenoid sinus. Other sinuses clear orbits negative Other: None ASPECTS (Alberta Stroke Program Early CT Score) - Ganglionic level infarction (caudate, lentiform nuclei, internal capsule, insula, M1-M3 cortex): 7 - Supraganglionic infarction (M4-M6 cortex): 3 Total score (0-10 with 10 being normal): 10 IMPRESSION: 1. No acute finding by CT. Old infarctions in the left MCA territory affecting the basal ganglia, temporal lobe and posterior frontal lobe. 2. ASPECTS is 10, allowing for the chronic findings. 3. These results were called by telephone at the time of interpretation on 11/28/2019 at 7:26 pm to provider [facility] , who verbally acknowledged these results. Electronically Signed   By: Paulina Fusi M.D.   On: 11/28/2019 19:27    Discharge Exam: Vitals:   12/03/19 2121 12/04/19 0543  BP: 131/66 131/83  Pulse: 82 83  Resp: 16 16  Temp: 98.4 F (36.9 C) 97.8 F (36.6 C)  SpO2: 100% 98%   Vitals:   12/03/19 0611 12/03/19 1317 12/03/19 2121 12/04/19 0543  BP: 124/79 104/71 131/66 131/83  Pulse: 72 66 82 83  Resp: 14 20 16 16   Temp: 98.1 F (36.7 C) 98.2 F (36.8 C) 98.4 F (36.9 C) 97.8 F (36.6 C)  TempSrc: Oral Oral Oral Oral  SpO2: 100% 99% 100% 98%  Weight:      Height:        General: Pt is currently awake, no distress Cardiovascular: RRR, S1/S2 +, no rubs, no gallops Respiratory: CTA bilaterally, no wheezing, no rhonchi Abdominal: Soft, NT, ND, bowel sounds + Extremities: no edema, no cyanosis    The results of significant diagnostics from this hospitalization (including imaging, microbiology, ancillary and laboratory) are listed below for reference.     Microbiology: Recent Results (from the past 240 hour(s))  SARS CORONAVIRUS 2 (TAT  6-24 HRS) Nasopharyngeal Nasopharyngeal Swab     Status: None   Collection Time: 11/28/19  9:55 PM   Specimen: Nasopharyngeal Swab  Result Value Ref Range Status   SARS Coronavirus 2 NEGATIVE NEGATIVE Final    Comment: (NOTE) SARS-CoV-2 target nucleic acids are NOT DETECTED. The SARS-CoV-2 RNA is generally detectable in upper and lower respiratory specimens during the acute phase of infection. Negative results do not preclude SARS-CoV-2 infection, do not rule out co-infections with other pathogens, and should not be used as the sole basis for treatment or other patient management decisions. Negative results must be combined with clinical observations, patient history, and epidemiological information. The expected result is Negative. Fact Sheet for Patients: HairSlick.no Fact Sheet for Healthcare Providers: quierodirigir.com This test is not yet approved or cleared by the [place] FDA and  has been authorized for detection and/or diagnosis of SARS-CoV-2 by FDA under an Emergency Use Authorization (EUA). This EUA will remain  in effect (meaning this test can be used) for the duration of the COVID-19 declaration under Section 56 4(b)(1) of the Act, 21 U.S.C. section 360bbb-3(b)(1), unless the authorization is terminated or revoked sooner. Performed at [facility], [address] [place], [place] [address]   Respiratory Panel by RT PCR (Flu A&B, Covid) - Nasopharyngeal Swab     Status: None   Collection Time: 12/03/19  1:24 PM   Specimen: Nasopharyngeal Swab  Result Value Ref Range Status   SARS Coronavirus 2 by RT PCR NEGATIVE NEGATIVE Final    Comment: (NOTE) SARS-CoV-2 target nucleic acids are NOT DETECTED. The SARS-CoV-2 RNA is generally detectable in upper respiratoy specimens during the acute phase of infection. The lowest concentration of SARS-CoV-2 viral copies this assay can detect is 131 copies/mL. A  negative result does not preclude SARS-Cov-2 infection and should not be used as the sole basis for treatment or other patient management decisions. A negative result may occur with  improper specimen collection/handling, submission of specimen other than nasopharyngeal swab, presence of viral mutation(s) within the areas targeted by this assay, and inadequate number of viral copies (<131 copies/mL). A negative result must be combined with clinical observations, patient history, and epidemiological information. The expected result is Negative. Fact Sheet for Patients:  https://www.Belgard.com/ Fact Sheet for Healthcare Providers:  https://www.young.biz/ This test is not yet ap proved or cleared by the [place] FDA and  has been authorized for detection and/or diagnosis of SARS-CoV-2 by FDA under an Emergency Use Authorization (EUA). This EUA will remain  in effect (meaning this test can be used) for the duration of the COVID-19 declaration under Section 564(b)(1) of the Act, 21 U.S.C. section 360bbb-3(b)(1), unless the authorization is terminated or revoked sooner.    Influenza A by PCR NEGATIVE NEGATIVE Final   Influenza B by PCR NEGATIVE NEGATIVE Final    Comment: (NOTE) The Xpert Xpress SARS-CoV-2/FLU/RSV assay is intended as an aid in  the diagnosis of influenza from Nasopharyngeal swab specimens and  should not be used as a sole basis for treatment. Nasal washings and  aspirates are unacceptable for Xpert Xpress SARS-CoV-2/FLU/RSV  testing. Fact Sheet for Patients: https://www.Arif.com/ Fact Sheet for Healthcare Providers: https://www.young.biz/ This test is not yet approved or cleared by the [place] FDA and  has been authorized for detection and/or diagnosis of SARS-CoV-2 by  FDA under an Emergency Use Authorization (EUA). This EUA will remain  in effect (meaning this test can be used) for  the duration of the  Covid-19 declaration under Section 564(b)(1) of the Act, 21  U.S.C. section 360bbb-3(b)(1), unless the authorization is  terminated or revoked. Performed at [facility], [address] [place], [place] [address]      Labs: BNP (last 3 results) No results for input(s): BNP in the last 8760 hours. Basic Metabolic Panel: Recent Labs  Lab 11/28/19 1926 11/28/19 1935 11/29/19 1230 11/30/19 0740  NA 131* 132* 135 137  K 5.0 5.1 4.7 4.3  CL 96* 98 100 99  CO2 22  --  26 25  GLUCOSE 202* 200* 145* 112*  BUN 28* 28* 22 30*  CREATININE 1.30* 1.20* 1.03* 1.09*  CALCIUM 9.4  --  9.2 9.8   Liver Function Tests: Recent Labs  Lab 11/28/19 1926  AST 15  ALT 10  ALKPHOS 76  BILITOT 0.9  PROT 7.2  ALBUMIN 3.9   No results for input(s): LIPASE, AMYLASE in the last 168 hours. No results for input(s): AMMONIA in the last 168 hours. CBC: Recent Labs  Lab 11/28/19 1926 11/28/19 1935 11/30/19 0740  WBC 9.4  --  5.4  NEUTROABS 7.8*  --   --   HGB 13.2 13.9 12.5  HCT 41.3 41.0 39.4  MCV 90.4  --  91.6  PLT 230  --  197   Cardiac Enzymes: No results for input(s): CKTOTAL, CKMB, CKMBINDEX, TROPONINI in the last  168 hours. BNP: Invalid input(s): POCBNP CBG: Recent Labs  Lab 12/03/19 0720 12/03/19 1115 12/03/19 1607 12/03/19 2122 12/04/19 0735  GLUCAP 114* 158* 127* 127* 129*   D-Dimer No results for input(s): DDIMER in the last 72 hours. Hgb A1c No results for input(s): HGBA1C in the last 72 hours. Lipid Profile No results for input(s): CHOL, HDL, LDLCALC, TRIG, CHOLHDL, LDLDIRECT in the last 72 hours. Thyroid function studies No results for input(s): TSH, T4TOTAL, T3FREE, THYROIDAB in the last 72 hours.  Invalid input(s): FREET3 Anemia work up No results for input(s): VITAMINB12, FOLATE, FERRITIN, TIBC, IRON, RETICCTPCT in the last 72 hours. Urinalysis    Component Value Date/Time   COLORURINE YELLOW 11/28/2019 1955   APPEARANCEUR CLEAR  11/28/2019 1955   LABSPEC 1.018 11/28/2019 1955   PHURINE 6.0 11/28/2019 1955   GLUCOSEU 50 (A) 11/28/2019 1955   HGBUR NEGATIVE 11/28/2019 1955   BILIRUBINUR NEGATIVE 11/28/2019 1955   KETONESUR 5 (A) 11/28/2019 1955   PROTEINUR NEGATIVE 11/28/2019 1955   UROBILINOGEN 0.2 06/13/2015 1141   NITRITE NEGATIVE 11/28/2019 1955   LEUKOCYTESUR NEGATIVE 11/28/2019 1955   Sepsis Labs Invalid input(s): PROCALCITONIN,  WBC,  LACTICIDVEN Microbiology Recent Results (from the past 240 hour(s))  SARS CORONAVIRUS 2 (TAT 6-24 HRS) Nasopharyngeal Nasopharyngeal Swab     Status: None   Collection Time: 11/28/19  9:55 PM   Specimen: Nasopharyngeal Swab  Result Value Ref Range Status   SARS Coronavirus 2 NEGATIVE NEGATIVE Final    Comment: (NOTE) SARS-CoV-2 target nucleic acids are NOT DETECTED. The SARS-CoV-2 RNA is generally detectable in upper and lower respiratory specimens during the acute phase of infection. Negative results do not preclude SARS-CoV-2 infection, do not rule out co-infections with other pathogens, and should not be used as the sole basis for treatment or other patient management decisions. Negative results must be combined with clinical observations, patient history, and epidemiological information. The expected result is Negative. Fact Sheet for Patients: HairSlick.no Fact Sheet for Healthcare Providers: quierodirigir.com This test is not yet approved or cleared by the [place] FDA and  has been authorized for detection and/or diagnosis of SARS-CoV-2 by FDA under an Emergency Use Authorization (EUA). This EUA will remain  in effect (meaning this test can be used) for the duration of the COVID-19 declaration under Section 56 4(b)(1) of the Act, 21 U.S.C. section 360bbb-3(b)(1), unless the authorization is terminated or revoked sooner. Performed at [facility], [address] [place], [place] [address]    Respiratory Panel by RT PCR (Flu A&B, Covid) - Nasopharyngeal Swab     Status: None   Collection Time: 12/03/19  1:24 PM   Specimen: Nasopharyngeal Swab  Result Value Ref Range Status   SARS Coronavirus 2 by RT PCR NEGATIVE NEGATIVE Final    Comment: (NOTE) SARS-CoV-2 target nucleic acids are NOT DETECTED. The SARS-CoV-2 RNA is generally detectable in upper respiratoy specimens during the acute phase of infection. The lowest concentration of SARS-CoV-2 viral copies this assay can detect is 131 copies/mL. A negative result does not preclude SARS-Cov-2 infection and should not be used as the sole basis for treatment or other patient management decisions. A negative result may occur with  improper specimen collection/handling, submission of specimen other than nasopharyngeal swab, presence of viral mutation(s) within the areas targeted by this assay, and inadequate number of viral copies (<131 copies/mL). A negative result must be combined with clinical observations, patient history, and epidemiological information. The expected result is Negative. Fact Sheet for  Patients:  https://www.Cadmus.com/https://www.fda.gov/media/142436/download Fact Sheet for Healthcare Providers:  https://www.young.biz/https://www.fda.gov/media/142435/download This test is not yet ap proved or cleared by the [place] FDA and  has been authorized for detection and/or diagnosis of SARS-CoV-2 by FDA under an Emergency Use Authorization (EUA). This EUA will remain  in effect (meaning this test can be used) for the duration of the COVID-19 declaration under Section 564(b)(1) of the Act, 21 U.S.C. section 360bbb-3(b)(1), unless the authorization is terminated or revoked sooner.    Influenza A by PCR NEGATIVE NEGATIVE Final   Influenza B by PCR NEGATIVE NEGATIVE Final    Comment: (NOTE) The Xpert Xpress SARS-CoV-2/FLU/RSV assay is intended as an aid in  the diagnosis of influenza from Nasopharyngeal swab specimens and  should not be used as a sole  basis for treatment. Nasal washings and  aspirates are unacceptable for Xpert Xpress SARS-CoV-2/FLU/RSV  testing. Fact Sheet for Patients: https://www.Hillmann.com/https://www.fda.gov/media/142436/download Fact Sheet for Healthcare Providers: https://www.young.biz/https://www.fda.gov/media/142435/download This test is not yet approved or cleared by the [place] FDA and  has been authorized for detection and/or diagnosis of SARS-CoV-2 by  FDA under an Emergency Use Authorization (EUA). This EUA will remain  in effect (meaning this test can be used) for the duration of the  Covid-19 declaration under Section 564(b)(1) of the Act, 21  U.S.C. section 360bbb-3(b)(1), unless the authorization is  terminated or revoked. Performed at [facility], [address] [place], [place] [address]      Time coordinating discharge: 35 minutes  SIGNED:   Erick BlinksJehanzeb Cori Henningsen, MD [facility] 12/04/2019, 11:04 AM  If 7PM-7AM, please contact night-coverage [email or web address]

## 2020-03-11 ENCOUNTER — Emergency Department (HOSPITAL_COMMUNITY): Payer: Medicare Other

## 2020-03-11 ENCOUNTER — Encounter (HOSPITAL_COMMUNITY): Payer: Self-pay

## 2020-03-11 ENCOUNTER — Inpatient Hospital Stay (HOSPITAL_COMMUNITY)
Admission: EM | Admit: 2020-03-11 | Discharge: 2020-03-18 | DRG: 871 | Disposition: A | Discharge status: Hospice | Payer: Medicare Other | Attending: Family Medicine | Admitting: Family Medicine

## 2020-03-11 ENCOUNTER — Other Ambulatory Visit: Payer: Self-pay

## 2020-03-11 DIAGNOSIS — R296 Repeated falls: Secondary | ICD-10-CM | POA: Diagnosis present

## 2020-03-11 DIAGNOSIS — I4891 Unspecified atrial fibrillation: Secondary | ICD-10-CM | POA: Diagnosis not present

## 2020-03-11 DIAGNOSIS — Z833 Family history of diabetes mellitus: Secondary | ICD-10-CM

## 2020-03-11 DIAGNOSIS — S40812A Abrasion of left upper arm, initial encounter: Secondary | ICD-10-CM | POA: Diagnosis present

## 2020-03-11 DIAGNOSIS — S40811A Abrasion of right upper arm, initial encounter: Secondary | ICD-10-CM | POA: Diagnosis present

## 2020-03-11 DIAGNOSIS — I63512 Cerebral infarction due to unspecified occlusion or stenosis of left middle cerebral artery: Secondary | ICD-10-CM | POA: Diagnosis not present

## 2020-03-11 DIAGNOSIS — Z7901 Long term (current) use of anticoagulants: Secondary | ICD-10-CM

## 2020-03-11 DIAGNOSIS — W19XXXA Unspecified fall, initial encounter: Secondary | ICD-10-CM | POA: Diagnosis not present

## 2020-03-11 DIAGNOSIS — E785 Hyperlipidemia, unspecified: Secondary | ICD-10-CM | POA: Diagnosis present

## 2020-03-11 DIAGNOSIS — I519 Heart disease, unspecified: Secondary | ICD-10-CM | POA: Diagnosis present

## 2020-03-11 DIAGNOSIS — E1159 Type 2 diabetes mellitus with other circulatory complications: Secondary | ICD-10-CM | POA: Diagnosis present

## 2020-03-11 DIAGNOSIS — R1310 Dysphagia, unspecified: Secondary | ICD-10-CM | POA: Diagnosis present

## 2020-03-11 DIAGNOSIS — F0150 Vascular dementia without behavioral disturbance: Secondary | ICD-10-CM | POA: Diagnosis present

## 2020-03-11 DIAGNOSIS — G9340 Encephalopathy, unspecified: Secondary | ICD-10-CM | POA: Diagnosis not present

## 2020-03-11 DIAGNOSIS — Z7189 Other specified counseling: Secondary | ICD-10-CM | POA: Diagnosis not present

## 2020-03-11 DIAGNOSIS — I872 Venous insufficiency (chronic) (peripheral): Secondary | ICD-10-CM | POA: Diagnosis present

## 2020-03-11 DIAGNOSIS — S8011XA Contusion of right lower leg, initial encounter: Secondary | ICD-10-CM | POA: Diagnosis present

## 2020-03-11 DIAGNOSIS — L89899 Pressure ulcer of other site, unspecified stage: Secondary | ICD-10-CM | POA: Diagnosis present

## 2020-03-11 DIAGNOSIS — R6520 Severe sepsis without septic shock: Secondary | ICD-10-CM | POA: Diagnosis not present

## 2020-03-11 DIAGNOSIS — G9341 Metabolic encephalopathy: Secondary | ICD-10-CM | POA: Diagnosis present

## 2020-03-11 DIAGNOSIS — K219 Gastro-esophageal reflux disease without esophagitis: Secondary | ICD-10-CM | POA: Diagnosis present

## 2020-03-11 DIAGNOSIS — S0990XA Unspecified injury of head, initial encounter: Secondary | ICD-10-CM | POA: Diagnosis present

## 2020-03-11 DIAGNOSIS — I10 Essential (primary) hypertension: Secondary | ICD-10-CM | POA: Diagnosis present

## 2020-03-11 DIAGNOSIS — S8012XA Contusion of left lower leg, initial encounter: Secondary | ICD-10-CM | POA: Diagnosis present

## 2020-03-11 DIAGNOSIS — I5189 Other ill-defined heart diseases: Secondary | ICD-10-CM | POA: Diagnosis present

## 2020-03-11 DIAGNOSIS — R4182 Altered mental status, unspecified: Secondary | ICD-10-CM | POA: Diagnosis not present

## 2020-03-11 DIAGNOSIS — D649 Anemia, unspecified: Secondary | ICD-10-CM | POA: Diagnosis not present

## 2020-03-11 DIAGNOSIS — Z20822 Contact with and (suspected) exposure to covid-19: Secondary | ICD-10-CM | POA: Diagnosis present

## 2020-03-11 DIAGNOSIS — D539 Nutritional anemia, unspecified: Secondary | ICD-10-CM | POA: Diagnosis present

## 2020-03-11 DIAGNOSIS — I480 Paroxysmal atrial fibrillation: Secondary | ICD-10-CM | POA: Diagnosis present

## 2020-03-11 DIAGNOSIS — S80811A Abrasion, right lower leg, initial encounter: Secondary | ICD-10-CM | POA: Diagnosis present

## 2020-03-11 DIAGNOSIS — S40022A Contusion of left upper arm, initial encounter: Secondary | ICD-10-CM | POA: Diagnosis present

## 2020-03-11 DIAGNOSIS — J189 Pneumonia, unspecified organism: Secondary | ICD-10-CM | POA: Diagnosis not present

## 2020-03-11 DIAGNOSIS — W050XXA Fall from non-moving wheelchair, initial encounter: Secondary | ICD-10-CM | POA: Diagnosis present

## 2020-03-11 DIAGNOSIS — S40021A Contusion of right upper arm, initial encounter: Secondary | ICD-10-CM | POA: Diagnosis present

## 2020-03-11 DIAGNOSIS — S80812A Abrasion, left lower leg, initial encounter: Secondary | ICD-10-CM | POA: Diagnosis present

## 2020-03-11 DIAGNOSIS — Z7984 Long term (current) use of oral hypoglycemic drugs: Secondary | ICD-10-CM

## 2020-03-11 DIAGNOSIS — Z66 Do not resuscitate: Secondary | ICD-10-CM | POA: Diagnosis present

## 2020-03-11 DIAGNOSIS — Z79899 Other long term (current) drug therapy: Secondary | ICD-10-CM

## 2020-03-11 DIAGNOSIS — R0902 Hypoxemia: Secondary | ICD-10-CM | POA: Diagnosis present

## 2020-03-11 DIAGNOSIS — Z515 Encounter for palliative care: Secondary | ICD-10-CM

## 2020-03-11 DIAGNOSIS — R4701 Aphasia: Secondary | ICD-10-CM | POA: Diagnosis present

## 2020-03-11 DIAGNOSIS — E119 Type 2 diabetes mellitus without complications: Secondary | ICD-10-CM

## 2020-03-11 DIAGNOSIS — Z8673 Personal history of transient ischemic attack (TIA), and cerebral infarction without residual deficits: Secondary | ICD-10-CM

## 2020-03-11 DIAGNOSIS — A419 Sepsis, unspecified organism: Principal | ICD-10-CM | POA: Diagnosis present

## 2020-03-11 LAB — IRON AND TIBC
Iron: 19 ug/dL — ABNORMAL LOW (ref 28–170)
Saturation Ratios: 7 % — ABNORMAL LOW (ref 10.4–31.8)
TIBC: 281 ug/dL (ref 250–450)
UIBC: 262 ug/dL

## 2020-03-11 LAB — COMPREHENSIVE METABOLIC PANEL
ALT: 11 U/L (ref 0–44)
AST: 20 U/L (ref 15–41)
Albumin: 2.7 g/dL — ABNORMAL LOW (ref 3.5–5.0)
Alkaline Phosphatase: 121 U/L (ref 38–126)
Anion gap: 11 (ref 5–15)
BUN: 21 mg/dL (ref 8–23)
CO2: 25 mmol/L (ref 22–32)
Calcium: 8.2 mg/dL — ABNORMAL LOW (ref 8.9–10.3)
Chloride: 101 mmol/L (ref 98–111)
Creatinine, Ser: 0.89 mg/dL (ref 0.44–1.00)
GFR calc Af Amer: >60 mL/min (ref >60)
GFR calc non Af Amer: >60 mL/min (ref >60)
Glucose, Bld: 102 mg/dL — ABNORMAL HIGH (ref 70–99)
Potassium: 4.7 mmol/L (ref 3.5–5.1)
Sodium: 137 mmol/L (ref 135–145)
Total Bilirubin: 0.8 mg/dL (ref 0.3–1.2)
Total Protein: 5.5 g/dL — ABNORMAL LOW (ref 6.5–8.1)

## 2020-03-11 LAB — URINALYSIS, ROUTINE W REFLEX MICROSCOPIC
Bilirubin Urine: NEGATIVE
Glucose, UA: NEGATIVE mg/dL
Hgb urine dipstick: NEGATIVE
Ketones, ur: NEGATIVE mg/dL
Leukocytes,Ua: NEGATIVE
Nitrite: NEGATIVE
Protein, ur: NEGATIVE mg/dL
Specific Gravity, Urine: 1.015 (ref 1.005–1.030)
pH: 5 (ref 5.0–8.0)

## 2020-03-11 LAB — RESPIRATORY PANEL BY RT PCR (FLU A&B, COVID)
Influenza A by PCR: NEGATIVE
Influenza B by PCR: NEGATIVE
SARS Coronavirus 2 by RT PCR: NEGATIVE

## 2020-03-11 LAB — CBC WITH DIFFERENTIAL/PLATELET
Abs Immature Granulocytes: 0.02 10*3/uL (ref 0.00–0.07)
Basophils Absolute: 0 10*3/uL (ref 0.0–0.1)
Basophils Relative: 0 %
Eosinophils Absolute: 0.1 10*3/uL (ref 0.0–0.5)
Eosinophils Relative: 1 %
HCT: 28.7 % — ABNORMAL LOW (ref 36.0–46.0)
Hemoglobin: 9.4 g/dL — ABNORMAL LOW (ref 12.0–15.0)
Immature Granulocytes: 0 %
Lymphocytes Relative: 20 %
Lymphs Abs: 1.2 10*3/uL (ref 0.7–4.0)
MCH: 32.5 pg (ref 26.0–34.0)
MCHC: 32.8 g/dL (ref 30.0–36.0)
MCV: 99.3 fL (ref 80.0–100.0)
Monocytes Absolute: 0.3 10*3/uL (ref 0.1–1.0)
Monocytes Relative: 5 %
Neutro Abs: 4.3 10*3/uL (ref 1.7–7.7)
Neutrophils Relative %: 74 %
Platelets: 130 10*3/uL — ABNORMAL LOW (ref 150–400)
RBC: 2.89 MIL/uL — ABNORMAL LOW (ref 3.87–5.11)
RDW: 20.5 % — ABNORMAL HIGH (ref 11.5–15.5)
WBC: 5.8 10*3/uL (ref 4.0–10.5)
nRBC: 0 % (ref 0.0–0.2)

## 2020-03-11 LAB — LACTIC ACID, PLASMA
Lactic Acid, Venous: 2.2 mmol/L (ref 0.5–1.9)
Lactic Acid, Venous: 1.6 mmol/L (ref 0.5–1.9)

## 2020-03-11 LAB — VITAMIN B12: Vitamin B-12: 110 pg/mL — ABNORMAL LOW (ref 180–914)

## 2020-03-11 LAB — FOLATE: Folate: 6.2 ng/mL (ref >5.9)

## 2020-03-11 LAB — PROTIME-INR
INR: 1.7 — ABNORMAL HIGH (ref 0.8–1.2)
Prothrombin Time: 19.4 seconds — ABNORMAL HIGH (ref 11.4–15.2)

## 2020-03-11 LAB — CBG MONITORING, ED: Glucose-Capillary: 106 mg/dL — ABNORMAL HIGH (ref 70–99)

## 2020-03-11 LAB — FERRITIN: Ferritin: 45 ng/mL (ref 11–307)

## 2020-03-11 LAB — MRSA PCR SCREENING: MRSA by PCR: POSITIVE — AB

## 2020-03-11 LAB — GLUCOSE, CAPILLARY: Glucose-Capillary: 108 mg/dL — ABNORMAL HIGH (ref 70–99)

## 2020-03-11 LAB — RETICULOCYTES
Immature Retic Fract: 25.7 % — ABNORMAL HIGH (ref 2.3–15.9)
RBC.: 2.9 MIL/uL — ABNORMAL LOW (ref 3.87–5.11)
Retic Count, Absolute: 81.5 10*3/uL (ref 19.0–186.0)
Retic Ct Pct: 2.8 % (ref 0.4–3.1)

## 2020-03-11 LAB — APTT: aPTT: 39 seconds — ABNORMAL HIGH (ref 24–36)

## 2020-03-11 LAB — POC SARS CORONAVIRUS 2 AG -  ED: SARS Coronavirus 2 Ag: NEGATIVE

## 2020-03-11 MED ORDER — VANCOMYCIN HCL IN DEXTROSE 1-5 GM/200ML-% IV SOLN
1000.0000 mg | Freq: Once | INTRAVENOUS | Status: AC
  Administered 2020-03-11: 1000 mg via INTRAVENOUS
  Filled 2020-03-11: qty 200

## 2020-03-11 MED ORDER — SODIUM CHLORIDE 0.9 % IV SOLN: INTRAVENOUS | Status: DC

## 2020-03-11 MED ORDER — INSULIN ASPART 100 UNIT/ML ~~LOC~~ SOLN
0.0000 [IU] | SUBCUTANEOUS | Status: DC
  Administered 2020-03-14 (×2): 1 [IU] via SUBCUTANEOUS
  Administered 2020-03-15: 2 [IU] via SUBCUTANEOUS
  Administered 2020-03-16: 3 [IU] via SUBCUTANEOUS
  Administered 2020-03-17: 1 [IU] via SUBCUTANEOUS
  Administered 2020-03-17: 2 [IU] via SUBCUTANEOUS

## 2020-03-11 MED ORDER — METOPROLOL TARTRATE 5 MG/5ML IV SOLN
5.0000 mg | Freq: Four times a day (QID) | INTRAVENOUS | Status: DC
  Administered 2020-03-12 (×4): 5 mg via INTRAVENOUS
  Filled 2020-03-11 (×4): qty 5

## 2020-03-11 MED ORDER — SODIUM CHLORIDE 0.9 % IV BOLUS
500.0000 mL | Freq: Once | INTRAVENOUS | Status: AC
  Administered 2020-03-11: 500 mL via INTRAVENOUS

## 2020-03-11 MED ORDER — HYDRALAZINE HCL 20 MG/ML IJ SOLN
5.0000 mg | Freq: Four times a day (QID) | INTRAMUSCULAR | Status: DC | PRN
  Filled 2020-03-11: qty 1

## 2020-03-11 MED ORDER — PIPERACILLIN-TAZOBACTAM 3.375 G IVPB
3.3750 g | Freq: Three times a day (TID) | INTRAVENOUS | Status: DC
  Administered 2020-03-11 – 2020-03-18 (×20): 3.375 g via INTRAVENOUS
  Filled 2020-03-11 (×19): qty 50

## 2020-03-11 MED ORDER — ACETAMINOPHEN 650 MG RE SUPP: 650.0000 mg | Freq: Four times a day (QID) | RECTAL | Status: DC | PRN

## 2020-03-11 MED ORDER — SODIUM CHLORIDE 0.9 % IV SOLN
1.0000 g | Freq: Once | INTRAVENOUS | Status: AC
  Administered 2020-03-11: 1 g via INTRAVENOUS
  Filled 2020-03-11: qty 10

## 2020-03-11 MED ORDER — ACETAMINOPHEN 325 MG PO TABS
650.0000 mg | ORAL_TABLET | Freq: Four times a day (QID) | ORAL | Status: DC | PRN
  Administered 2020-03-12 – 2020-03-14 (×3): 650 mg via ORAL
  Filled 2020-03-11 (×3): qty 2

## 2020-03-11 MED ORDER — METRONIDAZOLE IN NACL 5-0.79 MG/ML-% IV SOLN: 500.0000 mg | Freq: Three times a day (TID) | INTRAVENOUS | Status: DC

## 2020-03-11 MED ORDER — ALBUTEROL SULFATE (2.5 MG/3ML) 0.083% IN NEBU: 2.5000 mg | INHALATION_SOLUTION | RESPIRATORY_TRACT | Status: DC | PRN

## 2020-03-11 NOTE — Progress Notes (Signed)
CRITICAL VALUE ALERT  Critical Value:  MRSA + Date & Time Notied:  2154 on 03/11/2020  Provider Notified: 2203: M. Katherina Right  Orders Received/Actions taken: None at this time

## 2020-03-11 NOTE — H&P (Signed)
TRH H&P   Patient Demographics:    Holly Sparks, is a 79 y.o. female  MRN: [id number]   DOB - [birth year]  Admit Date - 03/11/2020  Outpatient Primary MD for the patient is Gareth Morgan, MD  Referring MD/NP/PA: PA Idol  Patient coming from: SNF  Chief Complaint  Patient presents with  . Fall      HPI:    Holly Sparks  is a 79 y.o. female, with history significant for diabetes, GERD, hypertension and history of atrial fibrillation on Eliquis, history of CVA with expressive aphasia and dementia, hypertension, hyperlipidemia, patient is a resident at SNF, she presents from SNF to evaluation for increased lethargy and generalized weakness, and fall yesterday, apparently patient fell from her wheelchair yesterday, where she had multiple bruising, she is on Eliquis, as well today she was noted to be more sleepy and less interactive today, she was sent for further evaluation, especially she struck her forehead when she fell from the chair, patient is poor historian, she herself cannot provide any complaints. - in ED she was noted to have multiple bruising from her fall, and forehead and right periorbital area, CT head with no evidence of bleed or any acute findings, she was noted to be febrile 100.8, as well patient was noted to have cough, chest x-ray was significant for right lower base opacity, and she had elevated lactic acid at 2.2, her urinalysis was negative, patient received broad-spectrum antibiotic in ED, blood cultures were done and [facility] was called to admit.    Review of systems:     Patient unable to Provide any reliable review of system given her dementia   With Past History of the following :    Past Medical History:  Diagnosis Date  . Diabetes (HCC)    Type 2 x 10 yrs  . Dysrhythmia    AFib  . GERD (gastroesophageal reflux disease)   . Grade I diastolic  dysfunction 09/03/2018  . High cholesterol   . Hypertension    x 10 yrs  . Paroxysmal a-fib: Remote hx of afib 06/15/2015  . Stroke [facility])    no deficits      Past Surgical History:  Procedure Laterality Date  . CATARACT EXTRACTION W/PHACO Left 06/06/2016   Procedure: CATARACT EXTRACTION PHACO AND INTRAOCULAR LENS PLACEMENT (IOC);  Surgeon: Gemma Payor, MD;  Location: [facility] ORS;  Service: Ophthalmology;  Laterality: Left;  CDE: 8.32  . COLONOSCOPY    . COLONOSCOPY N/A 05/08/2013   Procedure: COLONOSCOPY;  Surgeon: Malissa Hippo, MD;  Location: [facility] ENDO SUITE;  Service: Endoscopy;  Laterality: N/A;  1015  . NECK SURGERY     x 2 for spur and disc problems      Social History:     Social History   Tobacco Use  . Smoking status: Never Smoker  . Smokeless tobacco: Never Used  Substance Use  Topics  . Alcohol use: No    Alcohol/week: 0.0 standard drinks          Family History :     Family History  Problem Relation Age of Onset  . Diabetes Father       Home Medications:   Prior to Admission medications   Medication Sig Start Date End Date Taking? Authorizing Provider  acetaminophen (TYLENOL) 325 MG tablet Take 650 mg by mouth every 6 (six) hours as needed.    [provider]  Amino Acids-Protein Hydrolys (FEEDING SUPPLEMENT, PRO-STAT SUGAR FREE 64,) LIQD Take 30 mLs by mouth 2 (two) times daily between meals.    [provider]  apixaban (ELIQUIS) 5 MG TABS tablet Take 1 tablet (5 mg total) by mouth 2 (two) times daily. 06/26/15   Angiulli, Mcarthur Rossetti, PA-C  atorvastatin (LIPITOR) 20 MG tablet Take 1 tablet (20 mg total) by mouth at bedtime. 06/26/15   Angiulli, Mcarthur Rossetti, PA-C  diltiazem (CARDIZEM CD) 120 MG 24 hr capsule Take 1 capsule (120 mg total) by mouth daily. 01/21/18   Bethann Berkshire, MD  lisinopril-hydrochlorothiazide (ZESTORETIC) 20-12.5 MG tablet Take 0.5 tablets by mouth daily. 11/08/19   Vassie Loll, MD  magnesium oxide (MAG-OX) 400 MG tablet  Take 1 tablet (400 mg total) by mouth daily. 11/08/19   Vassie Loll, MD  metFORMIN (GLUCOPHAGE) 1000 MG tablet Take 500 mg by mouth 2 (two) times daily with a meal.    [provider]  metoprolol tartrate (LOPRESSOR) 25 MG tablet Take 12.5 mg by mouth 2 (two) times daily.    [provider]  ondansetron (ZOFRAN) 4 MG tablet Take 4 mg by mouth every 6 (six) hours as needed for nausea or vomiting.    [provider]  pantoprazole (PROTONIX) 40 MG tablet Take 40 mg by mouth daily.    [provider]  potassium chloride (KLOR-CON) 10 MEQ tablet Take 2 tablets (20 mEq total) by mouth daily. 11/08/19   Vassie Loll, MD     Allergies:    No Known Allergies   Physical Exam:   Vitals  Blood pressure 128/82, pulse 96, temperature (!) 100.8 F (38.2 C), temperature source Rectal, resp. rate 18, SpO2 100 %.   1. General frail elderly female, laying in bed in mild discomfort.  2.  Impaired cognition and coherent, answering yes and no questions appropriately though .  3.  Is overall with generalized weakness, but no facial droop is noted, deviated tongue, to lift bilateral lower extremity legs off bed .  4. Ears and Eyes appear Normal, Conjunctivae clear, PERRLA. dry Oral Mucosa.  5. Supple Neck, No JVD, No cervical lymphadenopathy appriciated, No Carotid Bruits.  6. Symmetrical Chest wall movement, Good air movement bilaterally, bilateral rails  7.  Irregular, tachycardic, No Gallops, Rubs or Murmurs, No Parasternal Heave.  8. Positive Bowel Sounds, Abdomen Soft, No tenderness, No organomegaly appriciated,No rebound -guarding or rigidity.  9.  No Cyanosis, blade skin Turgor, patient with right periorbital bruising, and chronic lower extremity wounds, please see pictures below.  10. Good muscle tone,  joints appear normal , no effusions,   11. No Palpable Lymph Nodes in Neck or Axillae          Data Review:    CBC Recent Labs  Lab  03/11/20 1457  WBC 5.8  HGB 9.4*  HCT 28.7*  PLT 130*  MCV 99.3  MCH 32.5  MCHC 32.8  RDW 20.5*  LYMPHSABS 1.2  MONOABS 0.3  EOSABS 0.1  BASOSABS 0.0   ------------------------------------------------------------------------------------------------------------------  Chemistries  Recent Labs  Lab 03/11/20 1457  NA 137  K 4.7  CL 101  CO2 25  GLUCOSE 102*  BUN 21  CREATININE 0.89  CALCIUM 8.2*  AST 20  ALT 11  ALKPHOS 121  BILITOT 0.8   ------------------------------------------------------------------------------------------------------------------ CrCl cannot be calculated (Unknown ideal weight.). ------------------------------------------------------------------------------------------------------------------ No results for input(s): TSH, T4TOTAL, T3FREE, THYROIDAB in the last 72 hours.  Invalid input(s): FREET3  Coagulation profile No results for input(s): INR, PROTIME in the last 168 hours. ------------------------------------------------------------------------------------------------------------------- No results for input(s): DDIMER in the last 72 hours. -------------------------------------------------------------------------------------------------------------------  Cardiac Enzymes No results for input(s): CKMB, TROPONINI, MYOGLOBIN in the last 168 hours.  Invalid input(s): CK ------------------------------------------------------------------------------------------------------------------    Component Value Date/Time   BNP 241.0 (H) 04/20/2018 1219     ---------------------------------------------------------------------------------------------------------------  Urinalysis    Component Value Date/Time   COLORURINE YELLOW 03/11/2020 1349   APPEARANCEUR HAZY (A) 03/11/2020 1349   LABSPEC 1.015 03/11/2020 1349   PHURINE 5.0 03/11/2020 1349   GLUCOSEU NEGATIVE 03/11/2020 1349   HGBUR NEGATIVE 03/11/2020 1349   BILIRUBINUR NEGATIVE  03/11/2020 1349   KETONESUR NEGATIVE 03/11/2020 1349   PROTEINUR NEGATIVE 03/11/2020 1349   UROBILINOGEN 0.2 06/13/2015 1141   NITRITE NEGATIVE 03/11/2020 1349   LEUKOCYTESUR NEGATIVE 03/11/2020 1349    ----------------------------------------------------------------------------------------------------------------   Imaging Results:    CT Head Wo Contrast  Result Date: 03/11/2020 CLINICAL DATA:  Fall from wheelchair yesterday while on blood thinners, altered mental status EXAM: CT HEAD WITHOUT CONTRAST TECHNIQUE: Contiguous axial images were obtained from the base of the skull through the vertex without intravenous contrast. COMPARISON:  11/28/2019 FINDINGS: Brain: Chronic atrophic and ischemic changes are again seen and stable. No findings to suggest acute hemorrhage, acute infarction or space-occupying mass lesion are noted. Stable old infarcts in the left MCA distribution are again seen. Vascular: No hyperdense vessel or unexpected calcification. Skull: Normal. Negative for fracture or focal lesion. Sinuses/Orbits: Mild opacification of the posterior left ethmoid air cells is again noted. Other: None IMPRESSION: Chronic atrophic and ischemic changes without acute abnormality. Electronically Signed   By: Inez Catalina M.D.   On: 03/11/2020 15:51   DG Chest Port 1 View  Result Date: 03/11/2020 CLINICAL DATA:  79 year old female with fever and cough. Fall from wheelchair yesterday. Negative for COVID-19 last year, being retested today. EXAM: PORTABLE CHEST 1 VIEW COMPARISON:  Portable chest 11/07/2019 and earlier. FINDINGS: Portable AP semi upright view at 1432 hours. New confluent abnormal opacities in the left lung about the hilum, and at the right lung base. Evidence of superimposed right pleural effusion, including some fluid suspected in the right minor fissure. The left lung base and right upper lobe remain well pneumatized. Lower lung volumes overall. Stable mediastinal contours. Visualized  tracheal air column is within normal limits. Prior cervical ACDF. No acute osseous abnormality identified. IMPRESSION: 1. Confluent new left perihilar and right lung base opacity most suspicious for multilobar pneumonia. Aspiration is also a consideration. 2. Superimposed small right pleural effusion suspected. Electronically Signed   By: Genevie Ann M.D.   On: 03/11/2020 15:14    My personal review of EKG: Rhythm A fib , Rate  107 /min, QTc 459 , no Acute ST changes   Assessment & Plan:    Active Problems:   Diabetes ([place])   Essential hypertension, benign   Expressive aphasia   Left middle cerebral artery stroke (HCC)   Chronic anticoagulation   Type 2  diabetes mellitus with circulatory disorder (HCC)   Atrial fibrillation with RVR (HCC)   Sepsis (HCC)   Sepsis secondary to pneumonia -Sepsis present on admission, febrile, tachycardic, elevated lactic acid and altered mental status. -Sepsis most likely due to pneumonia, highly suspicious of aspiration pneumonia especially with confluent opacity in right lower lung. -Follow-up blood cultures, received IV vancomycin and Rocephin in ED, will continue with Zosyn during hospital stay. -We will continue with IV fluids. -Keep n.p.o., will obtain SLP to evaluate for dysphagia. -Negative urine analysis. -Follow on COVID-19 test, but low probability especially she received her vaccine at facility as husband reports.  A. fib with RVR. -Heart rate uncontrolled, currently in the 120s, will keep on scheduled IV metoprolol pushes till she passes her swallow evaluation, meanwhile p.o. Cardizem on hold. -She is on Eliquis, will hold for next 24 hours, and resume when she passes her swallow evaluation.  Macrocytic anemia -Hemoglobin dropped to 9.4, will check Hemoccult, will check anemia panel including folic and B12.  Chronic lower extremity wounds -Most likely due to venous insufficiency, will consult wound care, likely will benefit from Unna  boots.  Acute metabolic encephalopathy. -Patient is less interactive and communicative than her baseline as per husband, this is most likely due to sepsis, CT head with no acute findings  Hypertension -Keep on scheduled IV metoprolol and as needed hydralazine  Hyperlipidemia -Resume medication when she passed swallow evaluation  Diabetes mellitus -Hold Metformin, keep on sliding scale during hospital stay.  History of CVA -Aspirin and Eliquis when she is able to swallow.     DVT Prophylaxis on Eliquis  AM Labs Ordered, also please review Full Orders  Family Communication: Admission, patients condition and plan of care including tests being ordered have been discussed with the patient and husband who indicate understanding and agree with the plan and Code Status.  Code Status DNR as previous admission, as well confirmed by husband at bedside  Likely DC to SNF  Condition GUARDED    Consults called: None  Admission status: inpatient  Time spent in minutes : 60 minutes   Huey Bienenstock M.D on 03/11/2020 at 4:31 PM  Between 7am to 7pm - Pager - [phone]. After 7pm go to [email or web address] - password [facility]  [facility] - Office  [phone]

## 2020-03-11 NOTE — ED Notes (Addendum)
Multiple bruises noted to both legs, both arms and face, greater on right.  With swelling noted to right face.  Pt is contracted to upper extremities.  Dressing to left foot.  Blister noted below right knee.

## 2020-03-11 NOTE — ED Provider Notes (Signed)
[facility] EMERGENCY DEPARTMENT Provider Note   CSN: [id number] Arrival date & time: 03/11/20  1313     History Chief Complaint  Patient presents with  . Fall    Holly Sparks is a 79 y.o. female with history significant for diabetes, GERD, hypertension and history of atrial fibrillation on Eliquis, history of CVA with expressive aphasia and dementia presenting from her local nursing home for evaluation of head injury.  She was in her wheelchair yesterday when she fell forward out of her chair and struck her forehead.  She did not have any loss of consciousness per nursing home report but today noted that she was "acting differently, seemed less alert and had a persistent lean towards the right while in her wheelchair.  She is not ambulatory at baseline.  Patient is unable to give history, level 5 caveat applies.  The history is provided by the nursing home.       Past Medical History:  Diagnosis Date  . Diabetes (HCC)    Type 2 x 10 yrs  . Dysrhythmia    AFib  . GERD (gastroesophageal reflux disease)   . Grade I diastolic dysfunction 09/03/2018  . High cholesterol   . Hypertension    x 10 yrs  . Paroxysmal a-fib: Remote hx of afib 06/15/2015  . Stroke [facility])    no deficits    Patient Active Problem List   Diagnosis Date Noted  . Adult failure to thrive 11/28/2019  . Atrial fibrillation with RVR (HCC)   . Hypokalemia   . Right arm weakness   . TIA (transient ischemic attack) 11/07/2019  . 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 Grade I diastolic dysfunction 09/03/2018  . Hypomagnesemia 09/03/2018  . Paroxysmal atrial fibrillation (HCC) 08/31/2015  . Chronic anticoagulation 08/31/2015  . Type 2 diabetes mellitus with circulatory disorder (HCC) 08/31/2015  . 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 Cerebral infarction due to embolism of left middle cerebral artery (HCC)   . Stroke-like symptoms 06/13/2015  .  HLD (hyperlipidemia) 06/13/2015  . DDD (degenerative disc disease), cervical 06/13/2015  . Aphasia 06/13/2015  . Expressive aphasia   . Cerebral infarction due to unspecified mechanism   . Type 2 diabetes mellitus without complication (HCC)   . IBS (irritable bowel syndrome) 04/24/2013  . Diabetes (HCC) 04/24/2013  . Essential hypertension, benign 04/24/2013    Past Surgical History:  Procedure Laterality Date  . CATARACT EXTRACTION W/PHACO Left 06/06/2016   Procedure: CATARACT EXTRACTION PHACO AND INTRAOCULAR LENS PLACEMENT (IOC);  Surgeon: Gemma Payor, MD;  Location: [facility] ORS;  Service: Ophthalmology;  Laterality: Left;  CDE: 8.32  . COLONOSCOPY    . COLONOSCOPY N/A 05/08/2013   Procedure: COLONOSCOPY;  Surgeon: Malissa Hippo, MD;  Location: [facility] ENDO SUITE;  Service: Endoscopy;  Laterality: N/A;  1015  . NECK SURGERY     x 2 for spur and disc problems     OB History   No obstetric history on file.     Family History  Problem Relation Age of Onset  . Diabetes Father     Social History   Tobacco Use  . Smoking status: Never Smoker  . Smokeless tobacco: Never Used  Substance Use Topics  . Alcohol use: No    Alcohol/week: 0.0 standard drinks  . Drug use: No    Home Medications Prior to Admission medications   Medication Sig Start Date End Date Taking? Authorizing  Provider  acetaminophen (TYLENOL) 325 MG tablet Take 650 mg by mouth every 6 (six) hours as needed.    [provider]  Amino Acids-Protein Hydrolys (FEEDING SUPPLEMENT, PRO-STAT SUGAR FREE 64,) LIQD Take 30 mLs by mouth 2 (two) times daily between meals.    [provider]  apixaban (ELIQUIS) 5 MG TABS tablet Take 1 tablet (5 mg total) by mouth 2 (two) times daily. 06/26/15   Angiulli, Lavon Paganini, PA-C  atorvastatin (LIPITOR) 20 MG tablet Take 1 tablet (20 mg total) by mouth at bedtime. 06/26/15   Angiulli, Lavon Paganini, PA-C  diltiazem (CARDIZEM CD) 120 MG 24 hr capsule Take 1 capsule (120 mg total) by  mouth daily. 01/21/18   Milton Ferguson, MD  lisinopril-hydrochlorothiazide (ZESTORETIC) 20-12.5 MG tablet Take 0.5 tablets by mouth daily. 11/08/19   Barton Dubois, MD  magnesium oxide (MAG-OX) 400 MG tablet Take 1 tablet (400 mg total) by mouth daily. 11/08/19   Barton Dubois, MD  metFORMIN (GLUCOPHAGE) 1000 MG tablet Take 500 mg by mouth 2 (two) times daily with a meal.    [provider]  metoprolol tartrate (LOPRESSOR) 25 MG tablet Take 12.5 mg by mouth 2 (two) times daily.    [provider]  ondansetron (ZOFRAN) 4 MG tablet Take 4 mg by mouth every 6 (six) hours as needed for nausea or vomiting.    [provider]  pantoprazole (PROTONIX) 40 MG tablet Take 40 mg by mouth daily.    [provider]  potassium chloride (KLOR-CON) 10 MEQ tablet Take 2 tablets (20 mEq total) by mouth daily. 11/08/19   Barton Dubois, MD    Allergies    Patient has no known allergies.  Review of Systems   Review of Systems  Unable to perform ROS: Dementia    Physical Exam Updated Vital Signs BP 128/82   Pulse 96   Temp (!) 100.8 F (38.2 C) (Rectal)   Resp 18   SpO2 100%   Physical Exam Vitals and nursing note reviewed.  Constitutional:      General: She is not in acute distress.    Appearance: She is well-developed. She is ill-appearing. She is not toxic-appearing.     Comments: Patient is febrile at 100.8 upon presentation  HENT:     Head: Normocephalic.     Comments: Scattered bruised forehead and right cheek.  Patient does not have pain reaction to palpation of facial bones.    Mouth/Throat:     Mouth: Mucous membranes are dry.  Eyes:     Conjunctiva/sclera: Conjunctivae normal.  Cardiovascular:     Rate and Rhythm: Tachycardia present. Rhythm irregular.     Pulses: Normal pulses.     Heart sounds: Normal heart sounds.     Comments: Borderline tachycardia borderline tachycardia. Pulmonary:     Effort: Pulmonary effort is normal.     Breath  sounds: Normal breath sounds. No wheezing.  Abdominal:     General: Bowel sounds are normal.     Palpations: Abdomen is soft.     Tenderness: There is no abdominal tenderness.  Musculoskeletal:        General: No deformity.     Cervical back: Normal range of motion. No rigidity.  Skin:    General: Skin is warm and dry.     Comments: Multiple shallow ulcerated lesions on bilateral legs.  There is also 1 intact bulla at her right upper anterior tibia.     ED Results / Procedures / Treatments  Labs (all labs ordered are listed, but only abnormal results are displayed) Labs Reviewed  URINALYSIS, ROUTINE W REFLEX MICROSCOPIC - Abnormal; Notable for the following components:      Result Value   APPearance HAZY (*)    All other components within normal limits  LACTIC ACID, PLASMA - Abnormal; Notable for the following components:   Lactic Acid, Venous 2.2 (*)    All other components within normal limits  COMPREHENSIVE METABOLIC PANEL - Abnormal; Notable for the following components:   Glucose, Bld 102 (*)    Calcium 8.2 (*)    Total Protein 5.5 (*)    Albumin 2.7 (*)    All other components within normal limits  CBC WITH DIFFERENTIAL/PLATELET - Abnormal; Notable for the following components:   RBC 2.89 (*)    Hemoglobin 9.4 (*)    HCT 28.7 (*)    RDW 20.5 (*)    Platelets 130 (*)    All other components within normal limits  URINE CULTURE  CULTURE, BLOOD (ROUTINE X 2)  CULTURE, BLOOD (ROUTINE X 2)  LACTIC ACID, PLASMA  PROTIME-INR  APTT  POC SARS CORONAVIRUS 2 AG -  ED  POC OCCULT BLOOD, ED    EKG EKG Interpretation  Date/Time:  Wednesday March 11 2020 13:27:42 EDT Ventricular Rate:  107 PR Interval:    QRS Duration: 84 QT Interval:  344 QTC Calculation: 459 R Axis:   -57 Text Interpretation: Atrial fibrillation Left anterior fascicular block Anterior infarct, old Confirmed by Vanetta Mulders [phone]) on 03/11/2020 3:52:17 PM   Radiology CT Head Wo  Contrast  Result Date: 03/11/2020 CLINICAL DATA:  Fall from wheelchair yesterday while on blood thinners, altered mental status EXAM: CT HEAD WITHOUT CONTRAST TECHNIQUE: Contiguous axial images were obtained from the base of the skull through the vertex without intravenous contrast. COMPARISON:  11/28/2019 FINDINGS: Brain: Chronic atrophic and ischemic changes are again seen and stable. No findings to suggest acute hemorrhage, acute infarction or space-occupying mass lesion are noted. Stable old infarcts in the left MCA distribution are again seen. Vascular: No hyperdense vessel or unexpected calcification. Skull: Normal. Negative for fracture or focal lesion. Sinuses/Orbits: Mild opacification of the posterior left ethmoid air cells is again noted. Other: None IMPRESSION: Chronic atrophic and ischemic changes without acute abnormality. Electronically Signed   By: Alcide Clever M.D.   On: 03/11/2020 15:51   DG Chest Port 1 View  Result Date: 03/11/2020 CLINICAL DATA:  79 year old female with fever and cough. Fall from wheelchair yesterday. Negative for COVID-19 last year, being retested today. EXAM: PORTABLE CHEST 1 VIEW COMPARISON:  Portable chest 11/07/2019 and earlier. FINDINGS: Portable AP semi upright view at 1432 hours. New confluent abnormal opacities in the left lung about the hilum, and at the right lung base. Evidence of superimposed right pleural effusion, including some fluid suspected in the right minor fissure. The left lung base and right upper lobe remain well pneumatized. Lower lung volumes overall. Stable mediastinal contours. Visualized tracheal air column is within normal limits. Prior cervical ACDF. No acute osseous abnormality identified. IMPRESSION: 1. Confluent new left perihilar and right lung base opacity most suspicious for multilobar pneumonia. Aspiration is also a consideration. 2. Superimposed small right pleural effusion suspected. Electronically Signed   By: Odessa Fleming M.D.   On:  03/11/2020 15:14    Procedures Procedures (including critical care time)  Medications Ordered in ED Medications  vancomycin (VANCOCIN) IVPB 1000 mg/200 mL premix (1,000 mg Intravenous New Bag/Given 03/11/20 1541)  sodium chloride 0.9 % bolus 500 mL (500 mLs Intravenous New Bag/Given 03/11/20 1501)  cefTRIAXone (ROCEPHIN) 1 g in sodium chloride 0.9 % 100 mL IVPB (0 g Intravenous Stopped 03/11/20 1539)    ED Course  I have reviewed the triage vital signs and the nursing notes.  Pertinent labs & imaging results that were available during my care of the patient were reviewed by me and considered in my medical decision making (see chart for details).    MDM Rules/Calculators/A&P                     Patient presenting from her nursing home initially for complaint of head injury yesterday with noted abnormal behavior and posture today by nursing home staff, presenting here with a fever of 100.8.  She does have a mildly elevated lactic acid level at 2.2, her chest x-ray suggests multifocal pneumonia, possibly aspiration pneumonia.  Her point-of-care Covid swab is negative, she has a normal white blood cell count.  She does have a anemia with a hemoglobin of 9.4 which is substantially reduced from prior measurement in January (12.5).  This appears to be normocytic anemia.,  Patient will require admission for hospital-acquired pneumonia.  She was initially given Rocephin prior to her infection being undifferentiated given sepsis protocol.  Once chest x-ray was completed vancomycin was added as well.  Her CT scan is negative for acute intracranial injury from yesterday's fall.  Pending Hemoccult testing at this time.  Will plan admission.   Call to Dr. Westley Gambles who accepts pt for admission. He asked for the 2 hour respiratory panel.      Final Clinical Impression(s) / ED Diagnoses Final diagnoses:  Pneumonia of both lungs due to infectious organism, unspecified part of lung  Anemia, unspecified type   Injury of head, initial encounter  Fall, initial encounter    Rx / DC Orders ED Discharge Orders    None       Victoriano Lain 03/11/20 1613    Pricilla Loveless, MD 03/14/20 [phone]

## 2020-03-11 NOTE — ED Notes (Signed)
Date and time results received: 03/11/20 1543  Test: Lactic Acid Critical Value: 2.3  Name of Provider Notified: Dr Deretha Emory  Orders Received? Or Actions Taken?: see new order.

## 2020-03-11 NOTE — ED Notes (Signed)
Difficulty obtaining IV and labs.  Second Nurse obtained.

## 2020-03-11 NOTE — ED Notes (Signed)
Not able to obtain second IV site.

## 2020-03-11 NOTE — ED Triage Notes (Signed)
Pt fell yesterday out of her wheelchair at [facility]. She did hit her head. Pt is on Eliquis for a previous TIA. Today, staff noted her acting differently and leaning to the right. History of dementia.

## 2020-03-12 ENCOUNTER — Other Ambulatory Visit: Payer: Self-pay

## 2020-03-12 ENCOUNTER — Encounter (HOSPITAL_COMMUNITY): Payer: Self-pay | Admitting: Internal Medicine

## 2020-03-12 DIAGNOSIS — Z7901 Long term (current) use of anticoagulants: Secondary | ICD-10-CM

## 2020-03-12 LAB — BASIC METABOLIC PANEL
Anion gap: 10 (ref 5–15)
BUN: 19 mg/dL (ref 8–23)
CO2: 24 mmol/L (ref 22–32)
Calcium: 7.8 mg/dL — ABNORMAL LOW (ref 8.9–10.3)
Chloride: 104 mmol/L (ref 98–111)
Creatinine, Ser: 0.83 mg/dL (ref 0.44–1.00)
GFR calc Af Amer: >60 mL/min (ref >60)
GFR calc non Af Amer: >60 mL/min (ref >60)
Glucose, Bld: 83 mg/dL (ref 70–99)
Potassium: 4 mmol/L (ref 3.5–5.1)
Sodium: 138 mmol/L (ref 135–145)

## 2020-03-12 LAB — GLUCOSE, CAPILLARY
Glucose-Capillary: 98 mg/dL (ref 70–99)
Glucose-Capillary: 85 mg/dL (ref 70–99)
Glucose-Capillary: 81 mg/dL (ref 70–99)
Glucose-Capillary: 73 mg/dL (ref 70–99)
Glucose-Capillary: 82 mg/dL (ref 70–99)
Glucose-Capillary: 87 mg/dL (ref 70–99)
Glucose-Capillary: 83 mg/dL (ref 70–99)

## 2020-03-12 LAB — CBC
HCT: 30.1 % — ABNORMAL LOW (ref 36.0–46.0)
Hemoglobin: 9.4 g/dL — ABNORMAL LOW (ref 12.0–15.0)
MCH: 31.1 pg (ref 26.0–34.0)
MCHC: 31.2 g/dL (ref 30.0–36.0)
MCV: 99.7 fL (ref 80.0–100.0)
Platelets: 276 10*3/uL (ref 150–400)
RBC: 3.02 MIL/uL — ABNORMAL LOW (ref 3.87–5.11)
RDW: 20 % — ABNORMAL HIGH (ref 11.5–15.5)
WBC: 7.1 10*3/uL (ref 4.0–10.5)
nRBC: 0 % (ref 0.0–0.2)

## 2020-03-12 LAB — HEMOGLOBIN A1C
Hgb A1c MFr Bld: 6.5 % — ABNORMAL HIGH (ref 4.8–5.6)
Mean Plasma Glucose: 139.85 mg/dL

## 2020-03-12 MED ORDER — HYDROCERIN EX CREA
TOPICAL_CREAM | Freq: Every day | CUTANEOUS | Status: DC
  Filled 2020-03-12: qty 113

## 2020-03-12 MED ORDER — APIXABAN 2.5 MG PO TABS
2.5000 mg | ORAL_TABLET | Freq: Two times a day (BID) | ORAL | Status: DC
  Administered 2020-03-13 – 2020-03-14 (×3): 2.5 mg via ORAL
  Filled 2020-03-12 (×3): qty 1

## 2020-03-12 MED ORDER — DILTIAZEM HCL ER COATED BEADS 120 MG PO CP24
120.0000 mg | ORAL_CAPSULE | Freq: Every day | ORAL | Status: DC
  Administered 2020-03-12 – 2020-03-18 (×7): 120 mg via ORAL
  Filled 2020-03-12 (×7): qty 1

## 2020-03-12 MED ORDER — METOPROLOL TARTRATE 25 MG PO TABS
12.5000 mg | ORAL_TABLET | Freq: Two times a day (BID) | ORAL | Status: DC
  Administered 2020-03-12 – 2020-03-18 (×12): 12.5 mg via ORAL
  Filled 2020-03-12 (×12): qty 1

## 2020-03-12 NOTE — Evaluation (Signed)
Clinical/Bedside Swallow Evaluation Patient Details  Name: Holly Sparks MRN: [id number] Date of Birth: Oct 10, 1941  Today's Date: 03/12/2020 Time: SLP Start Time (ACUTE ONLY): 1330 SLP Stop Time (ACUTE ONLY): 1354 SLP Time Calculation (min) (ACUTE ONLY): 24 min  Past Medical History:  Past Medical History:  Diagnosis Date  . Diabetes ([place])    Type 2 x 10 yrs  . Dysrhythmia    AFib  . GERD (gastroesophageal reflux disease)   . Grade I diastolic dysfunction 91/04/3845  . High cholesterol   . Hypertension    x 10 yrs  . Paroxysmal a-fib: Remote hx of afib 06/15/2015  . Stroke [facility])    no deficits   Past Surgical History:  Past Surgical History:  Procedure Laterality Date  . CATARACT EXTRACTION W/PHACO Left 06/06/2016   Procedure: CATARACT EXTRACTION PHACO AND INTRAOCULAR LENS PLACEMENT (IOC);  Surgeon: Tonny Branch, MD;  Location: [facility] ORS;  Service: Ophthalmology;  Laterality: Left;  CDE: 8.32  . COLONOSCOPY    . COLONOSCOPY N/A 05/08/2013   Procedure: COLONOSCOPY;  Surgeon: Rogene Houston, MD;  Location: [facility] ENDO SUITE;  Service: Endoscopy;  Laterality: N/A;  1015  . NECK SURGERY     x 2 for spur and disc problems   HPI:  Holly Sparks  is a 79 y.o. female, withhistory significant for diabetes, GERD, hypertension and history of atrial fibrillation on Eliquis, history of CVA with expressive aphasia and dementia, hypertension, hyperlipidemia, patient is a resident at SNF, she presents from SNF to evaluation for increased lethargy and generalized weakness, and fall yesterday, apparently patient fell from her wheelchair yesterday, where she had multiple bruising, she is on Eliquis, as well today she was noted to be more sleepy and less interactive today, she was sent for further evaluation, especially she struck her forehead when she fell from the chair, patient is poor historian, she herself cannot provide any complaints. In the ED she was noted to have multiple bruising from her fall,  and forehead and right periorbital area, CT head with no evidence of bleed or any acute findings, she was noted to be febrile 100.8, as well patient was noted to have cough, chest x-ray was significant for right lower base opacity, and she had elevated lactic acid at 2.2, her urinalysis was negative, patient received broad-spectrum antibiotic in ED, blood cultures were done and [facility] was called to admit. BSE requested.   Assessment / Plan / Recommendation Clinical Impression  Clinical swallow evaluation completed at bedside. Pt is known to this SLP from outpatient therapy several years ago, however Pt states that her memory is poor and she does not recall. She indicates that her husband, Holly Sparks, helps her with feeding (now admitted from SNF). She states that she does occasionally cough when drinking. Pt had MBSS following a stroke in 2016 and was initially placed on NTL, however advanced to thin after that. Diet at SNF is unknown. Pt with min generalized oral weakness. Suspect reduced oral control with delay in swallow trigger resulting in immediate coughing after thin liquids on 50% of thin liquid trials (better with teaspoon). Pt with improved control with NTL. She masticated regular textures with subjective report of "too dry". Will initiate D3/mech soft and nectar-thick liquids (NTL) with po meds whole in puree. Pt will need feeding assist and may benefit from [facility] tomorrow given chest x-ray findings and previous h/o dysphagia.   SLP Visit Diagnosis: Dysphagia, unspecified (R13.10)     Mild aspiration risk  Diet Recommendation Dysphagia 3 (Mech soft);Nectar-thick liquid   Liquid Administration via: Cup;Straw Medication Administration: Whole meds with puree Supervision: Staff to assist with self feeding Compensations: Slow rate;Small sips/bites Postural Changes: Seated upright at 90 degrees;Remain upright for at least 30 minutes after po intake    Other  Recommendations Oral Care  Recommendations: Oral care BID;Staff/trained caregiver to provide oral care Other Recommendations: Order thickener from pharmacy;Clarify dietary restrictions   Follow up Recommendations Skilled Nursing facility      Frequency and Duration min 2x/week  1 week       Prognosis Prognosis for Safe Diet Advancement: Fair Barriers to Reach Goals: Severity of deficits      Swallow Study   General Date of Onset: 03/11/20 HPI: Holly Sparks  is a 79 y.o. female, withhistory significant for diabetes, GERD, hypertension and history of atrial fibrillation on Eliquis, history of CVA with expressive aphasia and dementia, hypertension, hyperlipidemia, patient is a resident at SNF, she presents from SNF to evaluation for increased lethargy and generalized weakness, and fall yesterday, apparently patient fell from her wheelchair yesterday, where she had multiple bruising, she is on Eliquis, as well today she was noted to be more sleepy and less interactive today, she was sent for further evaluation, especially she struck her forehead when she fell from the chair, patient is poor historian, she herself cannot provide any complaints. In the ED she was noted to have multiple bruising from her fall, and forehead and right periorbital area, CT head with no evidence of bleed or any acute findings, she was noted to be febrile 100.8, as well patient was noted to have cough, chest x-ray was significant for right lower base opacity, and she had elevated lactic acid at 2.2, her urinalysis was negative, patient received broad-spectrum antibiotic in ED, blood cultures were done and [facility] was called to admit. BSE requested. Type of Study: Bedside Swallow Evaluation Previous Swallow Assessment: MBSS in 2016 D3/NTL, but was advanced to thin Diet Prior to this Study: NPO Temperature Spikes Noted: No Respiratory Status: Room air History of Recent Intubation: No Behavior/Cognition: Alert;Cooperative;Pleasant mood;Requires  cueing Oral Cavity Assessment: Within Functional Limits;Dry Oral Care Completed by SLP: Yes Oral Cavity - Dentition: Adequate natural dentition Vision: Impaired for self-feeding Self-Feeding Abilities: Needs assist Patient Positioning: Upright in bed Baseline Vocal Quality: Normal Volitional Cough: Strong Volitional Swallow: Able to elicit    Oral/Motor/Sensory Function Overall Oral Motor/Sensory Function: Generalized oral weakness   Ice Chips Ice chips: Within functional limits Presentation: Spoon   Thin Liquid Thin Liquid: Impaired Presentation: Cup;Straw Oral Phase Impairments: Reduced lingual movement/coordination Pharyngeal  Phase Impairments: Suspected delayed Swallow;Cough - Immediate    Nectar Thick Nectar Thick Liquid: Within functional limits Presentation: Cup;Straw   Honey Thick Honey Thick Liquid: Not tested   Puree Puree: Within functional limits Presentation: Spoon   Solid     Solid: Impaired Presentation: Spoon Oral Phase Impairments: Reduced lingual movement/coordination Oral Phase Functional Implications: Prolonged oral transit     Thank you,  Havery Moros, CCC-SLP [phone]  Acsa Estey 03/12/2020,2:37 PM

## 2020-03-12 NOTE — Consult Note (Signed)
WOC Nurse Consult Note: Reason for Consult: Patient known to our department from a previous admission.  Patient with partial thickness wounds on bilateral LEs, scattered, scant drainage.  See photos take on admission and uploaded into EMR. The partial thickness wound on the anterior aspect of the right foot is consistent with that of a wrap or Unna's boot application. Patient wound benefit from elevation and a hiatus from boots until anterior foot heals. Wound type: venous insufficiency, likely concomitant PAD Pressure Injury POA: Yes (medical device related pressure injury (MDRPI) at right anterior foot)  Measurement: to be obtained by Bedside RN today and documented in the Nursing Flow Sheet.   Wounds are  1. Right anterior foot 2. Right medial foot  3. Left lateral foot and  4. Left posterior heel   5. Scattered wounds that have scabbed over are located on the bilateral LEs and do not require measuring.   Wound bed: Dry, deflated blisters on wounds #2, 3 and 4 below. Dry, more darkly hued presentation on wound #1 above.  Drainage (amount, consistency, odor) All wounds are dry Periwound: Dry, flaking, peeling Dressing procedure/placement/frequency: I have provided guidance for Nursing on the care of this patient's chronic LE issues.  Daily care will be to cleanse with soap and water, rinse and pa t day, followed by application of a thin layer of a humectant, Eucerin. Open lesions will be covered with the moisture retentive, nonadherent, antimicrobial, xeroform and topped with a dry dressing. A Kerlix roll gauze wrapped from just below toes to just below knees will secure dressings and protect the LEs from inadvertent trauma. Bilateral feet will be placed into pressure redistribution heel boots. A pressure redistribution chair pad is provided for her for when OOB in the chair at her facility post discharge.  A sacral prophylactic foam dressing to the sacrum is to be placed while in house.  WOC  nursing team will not follow, but will remain available to this patient, the nursing and medical teams.  Please re-consult if needed. Thanks, Ladona Mow, MSN, RN, GNP, [place]  Pager# [phone]

## 2020-03-12 NOTE — Progress Notes (Signed)
Patient Demographics:    Holly Sparks, is a 79 y.o. female, DOB - Aug 16, 1941, EHM:[id number]  Admit date - 03/11/2020   Admitting Physician Starleen Arms, MD  Outpatient Primary MD for the patient is Gareth Morgan, MD  LOS - 1   Chief Complaint  Patient presents with  . Fall        Subjective:    Holly Sparks today has no fevers, no emesis,  No chest pain, --- oral intake is not great, swallow eval appreciated -Husband visited, -Less sleepy, intermittently confused  Assessment  & Plan :    Active Problems:   Diabetes (HCC)   Essential hypertension, benign   Expressive aphasia   Left middle cerebral artery stroke (HCC)   Chronic anticoagulation   Type 2 diabetes mellitus with circulatory disorder (HCC)   Atrial fibrillation with RVR (HCC)   Sepsis (HCC)   Brief Summary 78 y.o. female, withhistory significant for diabetes, GERD, hypertension and history of atrial fibrillation on Eliquis, history of CVA with expressive aphasia and dementia, hypertension, hyperlipidemia -Admitted from [facility] after falling out of her wheelchair while on Eliquis with concerns for lethargy and metabolic encephalopathy and sepsis secondary to pneumonia   A/p 1) sepsis secondary to pneumonia--- -received Rocephin and vancomycin in the ED, currently on IV Zosyn   2)acute metabolic encephalopathy--- secondary to #1 above,  superimposed on underlying dementia--treat sepsis as above #1  3) status post fall from wheelchair prior to admission while on Eliquis--- multiple bruises/abrasions to bilateral upper and lower extremities and face greater on the right side--- POA -Wound care specialist consult appreciated -cleanse wounds  with soap and water, rinse and pa t day, followed by application of a thin layer of a humectant, Eucerin. Open lesions will be covered with the moisture retentive,  nonadherent, antimicrobial, xeroform and topped with a dry dressing. A Kerlix roll gauze wrapped from just below toes to just below knees will secure dressings and protect the LEs from inadvertent trauma. Bilateral feet will be placed into pressure redistribution heel boots. A pressure redistribution chair pad is provided for her for when OOB in the chair at her facility post discharge.  A sacral prophylactic foam dressing to the sacrum is to be placed while in house.  4) A. fib with RVR--- history of TIA/CVA- okay to resume Eliquis, resume Cardizem CD and p.o. metoprolol for rate control  5)DM2-A1c 6.5, reflecting excellent DM control PTA -Continue to hold Metformin, Use Novolog/Humalog Sliding scale insulin with Accu-Cheks/Fingersticks as ordered  6)Dysphagia--- Speech eval appreciated, dysphagia 3/mechanical soft diet advised along with nectar thickened liquids   Disposition/Need for in-Hospital Stay- patient unable to be discharged at this time due to --sepsis secondary to pneumonia requiring IV antibiotics with some metabolic encephalopathy -Patient From: [facility] D/C Place: ALF versus SNF Barriers: Not Clinically Stable-sepsis secondary to pneumonia and acute encephalopathy with uncontrolled A. fib - Code Status : DNR  Family Communication:  -Husband  Consults  : Speech pathologist  DVT Prophylaxis  : Eliquis  Lab Results  Component Value Date   PLT 276 03/12/2020    Inpatient Medications  Scheduled Meds: . hydrocerin   Topical Daily  . insulin aspart  0-6 Units Subcutaneous Q4H  . metoprolol tartrate  5 mg Intravenous Q6H   Continuous Infusions: . sodium chloride 75 mL/hr at 03/11/20 1954  . sodium chloride 75 mL/hr at 03/11/20 1706  . piperacillin-tazobactam (ZOSYN)  IV 3.375 g (03/12/20 1345)   PRN Meds:.acetaminophen **OR** acetaminophen, albuterol, hydrALAZINE    Anti-infectives (From admission, onward)   Start     Dose/Rate Route Frequency Ordered Stop    03/11/20 1630  piperacillin-tazobactam (ZOSYN) IVPB 3.375 g     3.375 g 12.5 mL/hr over 240 Minutes Intravenous Every 8 hours 03/11/20 1622     03/11/20 1615  metroNIDAZOLE (FLAGYL) IVPB 500 mg  Status:  Discontinued     500 mg 100 mL/hr over 60 Minutes Intravenous Every 8 hours 03/11/20 1614 03/11/20 1622   03/11/20 1500  vancomycin (VANCOCIN) IVPB 1000 mg/200 mL premix     1,000 mg 200 mL/hr over 60 Minutes Intravenous  Once 03/11/20 1451 03/11/20 1650   03/11/20 1430  cefTRIAXone (ROCEPHIN) 1 g in sodium chloride 0.9 % 100 mL IVPB     1 g 200 mL/hr over 30 Minutes Intravenous  Once 03/11/20 1419 03/11/20 1539        Objective:   Vitals:   03/12/20 1322 03/12/20 1400 03/12/20 1804 03/12/20 1825  BP: 127/79 133/77 133/65   Pulse: (!) 106 100 (!) 111 95  Resp:  16    Temp:  98.3 F (36.8 C)    TempSrc:  Oral    SpO2: 95% 98%    Weight:      Height:        Wt Readings from Last 3 Encounters:  03/11/20 57.2 kg  11/28/19 58.1 kg  11/18/19 58.1 kg     Intake/Output Summary (Last 24 hours) at 03/12/2020 1833 Last data filed at 03/12/2020 1500 Gross per 24 hour  Intake 1463.29 ml  Output 400 ml  Net 1063.29 ml     Physical Exam  Gen:-More awake, less sleepy, HEENT:- [place].AT, No sclera icterus Neck-Supple Neck,No JVD,.  Lungs-diminished in bases, no wheezing CV- S1, S2 normal, irregularly irregular and tachycardic abd-  +ve B.Sounds, Abd Soft, No tenderness,    Extremity- pedal pulses present  Psych-affect is appropriate, oriented x3 Neuro-no new focal deficits, no tremors Skin-Wounds- Wounds are  1. Right anterior foot 2. Right medial foot  3. Left lateral foot and  4. Left posterior heel   Data Review:   Micro Results Recent Results (from the past 240 hour(s))  Urine culture     Status: Abnormal (Preliminary result)   Collection Time: 03/11/20  1:49 PM   Specimen: Urine, Catheterized  Result Value Ref Range Status   Specimen Description   Final    URINE,  CATHETERIZED Performed at [facility], [address] [place], [place] [address]    Special Requests   Final    NONE Performed at [name], [address] [place], [place] [address]    Culture (A)  Final    >=100,000 COLONIES/mL GRAM NEGATIVE RODS SUSCEPTIBILITIES TO FOLLOW Performed at [facility], [address] [place], [place] [address]    Report Status PENDING  Incomplete  Blood Culture (routine x 2)     Status: None (Preliminary result)   Collection Time: 03/11/20  2:57 PM   Specimen: Right Antecubital; Blood  Result Value Ref Range Status   Specimen Description RIGHT ANTECUBITAL  Final   Special Requests   Final    BOTTLES DRAWN AEROBIC ONLY Blood Culture results may not be optimal due to an inadequate  volume of blood received in culture bottles Performed at [facility] [address] [place], [place] [address]    Culture PENDING  Incomplete   Report Status PENDING  Incomplete  Blood Culture (routine x 2)     Status: None (Preliminary result)   Collection Time: 03/11/20  2:57 PM   Specimen: BLOOD RIGHT HAND  Result Value Ref Range Status   Specimen Description BLOOD RIGHT HAND  Final   Special Requests   Final    BOTTLES DRAWN AEROBIC AND ANAEROBIC Blood Culture results may not be optimal due to an inadequate volume of blood received in culture bottles Performed at [facility], [address], [place], [place] [address]    Culture PENDING  Incomplete   Report Status PENDING  Incomplete  Respiratory Panel by RT PCR (Flu A&B, Covid) - Nasopharyngeal Swab     Status: None   Collection Time: 03/11/20  4:30 PM   Specimen: Nasopharyngeal Swab  Result Value Ref Range Status   SARS Coronavirus 2 by RT PCR NEGATIVE NEGATIVE Final    Comment: (NOTE) SARS-CoV-2 target nucleic acids are NOT DETECTED. The SARS-CoV-2 RNA is generally detectable in upper respiratoy specimens during the acute phase of infection. The lowest concentration of SARS-CoV-2 viral copies  this assay can detect is 131 copies/mL. A negative result does not preclude SARS-Cov-2 infection and should not be used as the sole basis for treatment or other patient management decisions. A negative result may occur with  improper specimen collection/handling, submission of specimen other than nasopharyngeal swab, presence of viral mutation(s) within the areas targeted by this assay, and inadequate number of viral copies (<131 copies/mL). A negative result must be combined with clinical observations, patient history, and epidemiological information. The expected result is Negative. Fact Sheet for Patients:  https://www.Waheed.com/ Fact Sheet for Healthcare Providers:  https://www.young.biz/ This test is not yet ap proved or cleared by the [place] FDA and  has been authorized for detection and/or diagnosis of SARS-CoV-2 by FDA under an Emergency Use Authorization (EUA). This EUA will remain  in effect (meaning this test can be used) for the duration of the COVID-19 declaration under Section 564(b)(1) of the Act, 21 U.S.C. section 360bbb-3(b)(1), unless the authorization is terminated or revoked sooner.    Influenza A by PCR NEGATIVE NEGATIVE Final   Influenza B by PCR NEGATIVE NEGATIVE Final    Comment: (NOTE) The Xpert Xpress SARS-CoV-2/FLU/RSV assay is intended as an aid in  the diagnosis of influenza from Nasopharyngeal swab specimens and  should not be used as a sole basis for treatment. Nasal washings and  aspirates are unacceptable for Xpert Xpress SARS-CoV-2/FLU/RSV  testing. Fact Sheet for Patients: https://www.Pederson.com/ Fact Sheet for Healthcare Providers: https://www.young.biz/ This test is not yet approved or cleared by the [place] FDA and  has been authorized for detection and/or diagnosis of SARS-CoV-2 by  FDA under an Emergency Use Authorization (EUA). This EUA will remain  in  effect (meaning this test can be used) for the duration of the  Covid-19 declaration under Section 564(b)(1) of the Act, 21  U.S.C. section 360bbb-3(b)(1), unless the authorization is  terminated or revoked. Performed at [facility], [address], [place], [place] [address]   MRSA PCR Screening     Status: Abnormal   Collection Time: 03/11/20  4:43 PM   Specimen: Nasal Mucosa; Nasopharyngeal  Result Value Ref Range Status   MRSA by PCR POSITIVE (A) NEGATIVE Final    Comment:  The GeneXpert MRSA Assay (FDA approved for NASAL specimens only), is one component of a comprehensive MRSA colonization surveillance program. It is not intended to diagnose MRSA infection nor to guide or monitor treatment for MRSA infections. RESULT CALLED TO, READ BACK BY AND VERIFIED WITH: KILMER,T ON 03/11/20 AT 2155 BY LOY,C Performed at [facility], [address], [place], [place] [address]     Radiology Reports CT Head Wo Contrast  Result Date: 03/11/2020 CLINICAL DATA:  Fall from wheelchair yesterday while on blood thinners, altered mental status EXAM: CT HEAD WITHOUT CONTRAST TECHNIQUE: Contiguous axial images were obtained from the base of the skull through the vertex without intravenous contrast. COMPARISON:  11/28/2019 FINDINGS: Brain: Chronic atrophic and ischemic changes are again seen and stable. No findings to suggest acute hemorrhage, acute infarction or space-occupying mass lesion are noted. Stable old infarcts in the left MCA distribution are again seen. Vascular: No hyperdense vessel or unexpected calcification. Skull: Normal. Negative for fracture or focal lesion. Sinuses/Orbits: Mild opacification of the posterior left ethmoid air cells is again noted. Other: None IMPRESSION: Chronic atrophic and ischemic changes without acute abnormality. Electronically Signed   By: Alcide Clever M.D.   On: 03/11/2020 15:51   DG Chest Port 1 View  Result Date: 03/11/2020 CLINICAL DATA:  79 year old  female with fever and cough. Fall from wheelchair yesterday. Negative for COVID-19 last year, being retested today. EXAM: PORTABLE CHEST 1 VIEW COMPARISON:  Portable chest 11/07/2019 and earlier. FINDINGS: Portable AP semi upright view at 1432 hours. New confluent abnormal opacities in the left lung about the hilum, and at the right lung base. Evidence of superimposed right pleural effusion, including some fluid suspected in the right minor fissure. The left lung base and right upper lobe remain well pneumatized. Lower lung volumes overall. Stable mediastinal contours. Visualized tracheal air column is within normal limits. Prior cervical ACDF. No acute osseous abnormality identified. IMPRESSION: 1. Confluent new left perihilar and right lung base opacity most suspicious for multilobar pneumonia. Aspiration is also a consideration. 2. Superimposed small right pleural effusion suspected. Electronically Signed   By: Odessa Fleming M.D.   On: 03/11/2020 15:14     CBC Recent Labs  Lab 03/11/20 1457 03/12/20 0457  WBC 5.8 7.1  HGB 9.4* 9.4*  HCT 28.7* 30.1*  PLT 130* 276  MCV 99.3 99.7  MCH 32.5 31.1  MCHC 32.8 31.2  RDW 20.5* 20.0*  LYMPHSABS 1.2  --   MONOABS 0.3  --   EOSABS 0.1  --   BASOSABS 0.0  --     Chemistries  Recent Labs  Lab 03/11/20 1457 03/12/20 0457  NA 137 138  K 4.7 4.0  CL 101 104  CO2 25 24  GLUCOSE 102* 83  BUN 21 19  CREATININE 0.89 0.83  CALCIUM 8.2* 7.8*  AST 20  --   ALT 11  --   ALKPHOS 121  --   BILITOT 0.8  --    ------------------------------------------------------------------------------------------------------------------ No results for input(s): CHOL, HDL, LDLCALC, TRIG, CHOLHDL, LDLDIRECT in the last 72 hours.  Lab Results  Component Value Date   HGBA1C 6.5 (H) 03/12/2020   ------------------------------------------------------------------------------------------------------------------ No results for input(s): TSH, T4TOTAL, T3FREE, THYROIDAB in  the last 72 hours.  Invalid input(s): FREET3 ------------------------------------------------------------------------------------------------------------------ Recent Labs    03/11/20 1457  VITAMINB12 110*  FOLATE 6.2  FERRITIN 45  TIBC 281  IRON 19*  RETICCTPCT 2.8    Coagulation profile Recent Labs  Lab 03/11/20 1631  INR 1.7*  No results for input(s): DDIMER in the last 72 hours.  Cardiac Enzymes No results for input(s): CKMB, TROPONINI, MYOGLOBIN in the last 168 hours.  Invalid input(s): CK ------------------------------------------------------------------------------------------------------------------    Component Value Date/Time   BNP 241.0 (H) 04/20/2018 7628     Roxan Hockey M.D on 03/12/2020 at 6:33 PM  Go to [email or web address] - for contact info  [facility] - Office  [phone]

## 2020-03-12 NOTE — Progress Notes (Signed)
Patient in Afib with rates in the upper 90s to upper 110s.  Dr. Mariea Clonts notified.

## 2020-03-13 ENCOUNTER — Inpatient Hospital Stay (HOSPITAL_COMMUNITY): Payer: Medicare Other

## 2020-03-13 DIAGNOSIS — I519 Heart disease, unspecified: Secondary | ICD-10-CM

## 2020-03-13 DIAGNOSIS — I63512 Cerebral infarction due to unspecified occlusion or stenosis of left middle cerebral artery: Secondary | ICD-10-CM

## 2020-03-13 LAB — GLUCOSE, CAPILLARY
Glucose-Capillary: 101 mg/dL — ABNORMAL HIGH (ref 70–99)
Glucose-Capillary: 72 mg/dL (ref 70–99)
Glucose-Capillary: 82 mg/dL (ref 70–99)
Glucose-Capillary: 82 mg/dL (ref 70–99)
Glucose-Capillary: 85 mg/dL (ref 70–99)
Glucose-Capillary: 92 mg/dL (ref 70–99)

## 2020-03-13 LAB — URINE CULTURE: Culture: >=100000 — AB

## 2020-03-13 NOTE — Evaluation (Signed)
Physical Therapy Evaluation Patient Details Name: Holly Sparks MRN: [id number] DOB: 09/28/1941 Today's Date: 03/13/2020   History of Present Illness  Holly Sparks  is a 79 y.o. female, with history significant for diabetes, GERD, hypertension and history of atrial fibrillation on Eliquis, history of CVA with expressive aphasia and dementia, hypertension, hyperlipidemia, patient is a resident at SNF, she presents from SNF to evaluation for increased lethargy and generalized weakness, and fall yesterday, apparently patient fell from her wheelchair yesterday, where she had multiple bruising, she is on Eliquis, as well today she was noted to be more sleepy and less interactive today, she was sent for further evaluation, especially she struck her forehead when she fell from the chair, patient is poor historian, she herself cannot provide any complaints.- in ED she was noted to have multiple bruising from her fall, and forehead and right periorbital area, CT head with no evidence of bleed or any acute findings, she was noted to be febrile 100.8, as well patient was noted to have cough, chest x-ray was significant for right lower base opacity, and she had elevated lactic acid at 2.2, her urinalysis was negative, patient received broad-spectrum antibiotic in ED, blood cultures were done and [facility] was called to admit.    Clinical Impression  Patient limited for functional mobility as stated below secondary to BLE weakness, fatigue and poor activity tolerance. Patient requires assist with bed mobility today. She is able to move LEs against gravity while in bed today and shows overall weakness. When attempting to transition to seated EOB, patient has c/o pain secondary to BLE wounds. Patient repositioned in bed to comfort. Patient limited for functional mobility as stated below secondary to BLE weakness, fatigue and poor standing balance. All further PT needs can be met in the next venue of care.        Follow Up Recommendations Other (comment);Supervision/Assistance - 24 hour;Supervision for mobility/OOB(Assisted Living with physical therapy)    Equipment Recommendations  Other (comment)(Wheelchair)    Recommendations for Other Services       Precautions / Restrictions Precautions Precautions: Fall Precaution Comments: LE wounds with protective boots on feet Restrictions Weight Bearing Restrictions: No      Mobility  Bed Mobility Overal bed mobility: Needs Assistance Bed Mobility: Supine to Sit     Supine to sit: Max assist     General bed mobility comments: max assist to sit up in bed with difficulty secondary to weakness, able to move LEs with verbal cueing  Transfers                    Ambulation/Gait                Stairs            Wheelchair Mobility    Modified Rankin (Stroke Patients Only)       Balance                                             Pertinent Vitals/Pain Pain Assessment: No/denies pain    Home Living Family/patient expects to be discharged to:: Assisted living               Home Equipment: Walker - 2 wheels;Cane - single point;Shower seat - built in Additional Comments: Patient appears to be a poor historian, information of equipment from chart review  Prior Function Level of Independence: Needs assistance   Gait / Transfers Assistance Needed: Patient has been using another resident's wheel chair to get around  ADL's / Homemaking Assistance Needed: Staff assists        Hand Dominance        Extremity/Trunk Assessment   Upper Extremity Assessment Upper Extremity Assessment: Generalized weakness    Lower Extremity Assessment Lower Extremity Assessment: Generalized weakness       Communication   Communication: Expressive difficulties;HOH;Receptive difficulties  Cognition Arousal/Alertness: Awake/alert Behavior During Therapy: WFL for tasks assessed/performed;Flat  affect Overall Cognitive Status: No family/caregiver present to determine baseline cognitive functioning                                        General Comments      Exercises     Assessment/Plan    PT Assessment All further PT needs can be met in the next venue of care  PT Problem List Decreased strength;Decreased cognition;Decreased knowledge of use of DME;Decreased activity tolerance;Decreased balance;Decreased safety awareness;Decreased knowledge of precautions;Decreased mobility;Decreased skin integrity       PT Treatment Interventions      PT Goals (Current goals can be found in the Care Plan section)  Acute Rehab PT Goals Patient Stated Goal: return home PT Goal Formulation: With patient Time For Goal Achievement: 03/13/20 Potential to Achieve Goals: Fair    Frequency     Barriers to discharge        Co-evaluation               AM-PAC PT "6 Clicks" Mobility  Outcome Measure Help needed turning from your back to your side while in a flat bed without using bedrails?: A Little Help needed moving from lying on your back to sitting on the side of a flat bed without using bedrails?: A Lot Help needed moving to and from a bed to a chair (including a wheelchair)?: Total Help needed standing up from a chair using your arms (e.g., wheelchair or bedside chair)?: Total Help needed to walk in hospital room?: Total Help needed climbing 3-5 steps with a railing? : Total 6 Click Score: 9    End of Session   Activity Tolerance: Patient limited by fatigue Patient left: in bed;with call bell/phone within reach;with bed alarm set Nurse Communication: Mobility status PT Visit Diagnosis: Unsteadiness on feet (R26.81);Other abnormalities of gait and mobility (R26.89);Muscle weakness (generalized) (M62.81);History of falling (Z91.81)    Time: 2595-6387 PT Time Calculation (min) (ACUTE ONLY): 8 min   Charges:   PT Evaluation $PT Eval Low Complexity: 1  Low          12:26 PM, 03/13/20 Mearl Latin PT, DPT Physical Therapist at [facility]

## 2020-03-13 NOTE — Progress Notes (Signed)
 -  palliative care consult appreciated,  --husband request another day or 2 to discuss with other family members -Husband is leaning towards completing antibiotics for pneumonia this admission and transitioning to comfort care/palliative involvement at facility upon discharge back -DNR/DNI husband expresses desire for possible de-escalation of care/palliative care involvement and focus on comfort after completing antibiotics this admission  Holly Hale, MD

## 2020-03-13 NOTE — Progress Notes (Signed)
Patient Demographics:    Holly Sparks, is a 79 y.o. female, DOB - 04/16/1941, ZOX:[id number]  Admit date - 03/11/2020   Admitting Physician Starleen Arms, MD  Outpatient Primary MD for the patient is Gareth Morgan, MD  LOS - 2   Chief Complaint  Patient presents with   Fall        Subjective:    Holly Sparks today has no fevers, no emesis,  No chest pain -Conference with husband and palliative care provider--- husband expresses desire for possible de-escalation of care/palliative care involvement and focus on comfort after completing antibiotics this admission  -Oral intake remains challenging with swallowing and aspiration concerns --Dyspnea and cough is not worse   Assessment  & Plan :    Active Problems:   Diabetes (HCC)   Essential hypertension, benign   Expressive aphasia   Left middle cerebral artery stroke (HCC)   Chronic anticoagulation   Type 2 diabetes mellitus with circulatory disorder (HCC)   Atrial fibrillation with RVR (HCC)   Sepsis (HCC)   Brief Summary 79 y.o. female, withhistory significant for diabetes, GERD, hypertension and history of atrial fibrillation on Eliquis, history of CVA with expressive aphasia and dementia, hypertension, hyperlipidemia -Admitted from [facility] after falling out of her wheelchair while on Eliquis with concerns for lethargy and metabolic encephalopathy and sepsis secondary to pneumonia   A/p 1) sepsis secondary to pneumonia--- -received Rocephin and vancomycin in the ED,  currently on IV Zosyn -Plan is to discharge on oral Augmentin -Dyspnea and cough is not worse  2)acute metabolic encephalopathy--- secondary to #1 above,  superimposed on underlying dementia--treat sepsis as above #1  3) status post fall from wheelchair prior to admission while on Eliquis--- multiple bruises/abrasions to bilateral upper and lower  extremities and face greater on the right side--- POA -Wound care specialist consult appreciated -cleanse wounds  with soap and water, rinse and pa t day, followed by application of a thin layer of a humectant, Eucerin. Open lesions will be covered with the moisture retentive, nonadherent, antimicrobial, xeroform and topped with a dry dressing. A Kerlix roll gauze wrapped from just below toes to just below knees will secure dressings and protect the LEs from inadvertent trauma. Bilateral feet will be placed into pressure redistribution heel boots. A pressure redistribution chair pad is provided for her for when OOB in the chair at her facility post discharge.  A sacral prophylactic foam dressing to the sacrum is to be placed while in house.  4) A. fib with RVR--- history of TIA/CVA-Eliquis has been resumed at lower dose of 2.5 mg twice daily -c/n  Cardizem CD and p.o. metoprolol for rate control  5)DM2-A1c 6.5, reflecting excellent DM control PTA -Continue to hold Metformin, Use Novolog/Humalog Sliding scale insulin with Accu-Cheks/Fingersticks as ordered  6)Dysphagia--- Speech eval appreciated,  - Dysphagia 3 (Mech soft) solids;Thin liquid  7)Social/Ethics-- GOC----dementia--- and poor oral intake swallowing  8)Social/Ethics--palliative care consult appreciated,  --husband request another day or 2 to discuss with other family members -Husband is leaning towards completing antibiotics for pneumonia this admission and transitioning to comfort care/palliative involvement at facility upon discharge back -DNR/DNI  Disposition/Need for in-Hospital Stay- patient unable to be discharged at this time due to --sepsis secondary  to pneumonia requiring IV antibiotics with some metabolic encephalopathy -Patient From: [facility] D/C Place: ALF versus SNF--- possibly with de-escalation of care/palliative care involvement and focus on comfort Barriers: Not Clinically Stable-sepsis secondary to pneumonia  and acute encephalopathy with uncontrolled A. fib - Code Status : DNR  Family Communication:  -Husband ( possibly with de-escalation of care/palliative care involvement and focus on comfort)  Consults  : Speech pathologist/palliative care  DVT Prophylaxis  : Eliquis  Lab Results  Component Value Date   PLT 276 03/12/2020    Inpatient Medications  Scheduled Meds:  apixaban  2.5 mg Oral BID   diltiazem  120 mg Oral Daily   hydrocerin   Topical Daily   insulin aspart  0-6 Units Subcutaneous Q4H   metoprolol tartrate  12.5 mg Oral BID   Continuous Infusions:  sodium chloride 75 mL/hr at 03/11/20 1954   sodium chloride 75 mL/hr at 03/11/20 1706   piperacillin-tazobactam (ZOSYN)  IV 3.375 g (03/13/20 0541)   PRN Meds:.acetaminophen **OR** acetaminophen, albuterol    Anti-infectives (From admission, onward)   Start     Dose/Rate Route Frequency Ordered Stop   03/11/20 1630  piperacillin-tazobactam (ZOSYN) IVPB 3.375 g     3.375 g 12.5 mL/hr over 240 Minutes Intravenous Every 8 hours 03/11/20 1622     03/11/20 1615  metroNIDAZOLE (FLAGYL) IVPB 500 mg  Status:  Discontinued     500 mg 100 mL/hr over 60 Minutes Intravenous Every 8 hours 03/11/20 1614 03/11/20 1622   03/11/20 1500  vancomycin (VANCOCIN) IVPB 1000 mg/200 mL premix     1,000 mg 200 mL/hr over 60 Minutes Intravenous  Once 03/11/20 1451 03/11/20 1650   03/11/20 1430  cefTRIAXone (ROCEPHIN) 1 g in sodium chloride 0.9 % 100 mL IVPB     1 g 200 mL/hr over 30 Minutes Intravenous  Once 03/11/20 1419 03/11/20 1539        Objective:   Vitals:   03/12/20 1804 03/12/20 1825 03/12/20 1950 03/12/20 2100  BP: 133/65   132/82  Pulse: (!) 111 95  95  Resp:    18  Temp:    98.2 F (36.8 C)  TempSrc:    Oral  SpO2:   97% 95%  Weight:      Height:        Wt Readings from Last 3 Encounters:  03/11/20 57.2 kg  11/28/19 58.1 kg  11/18/19 58.1 kg     Intake/Output Summary (Last 24 hours) at 03/13/2020  1228 Last data filed at 03/12/2020 1500 Gross per 24 hour  Intake 1343.29 ml  Output --  Net 1343.29 ml     Physical Exam  Gen:-More awake, more interactive  HEENT:- [place].AT, No sclera icterus Neck-Supple Neck,No JVD,.  Lungs-diminished in bases, no wheezing CV- S1, S2 normal, irregularly irregular and tachycardic  abd-  +ve B.Sounds, Abd Soft, No tenderness,    Extremity- pedal pulses present  Psych-affect is appropriate, oriented x3 Neuro-generalized weakness, no new focal deficits, Skin-Wounds- Wounds are  1. Right anterior foot 2. Right medial foot  3. Left lateral foot and  4. Left posterior heel   Data Review:   Micro Results Recent Results (from the past 240 hour(s))  Urine culture     Status: Abnormal   Collection Time: 03/11/20  1:49 PM   Specimen: Urine, Catheterized  Result Value Ref Range Status   Specimen Description   Final    URINE, CATHETERIZED Performed at [facility], [address], [place],  [place] [address]    Special Requests   Final    NONE Performed at [facility] [address] [place], [place] [address]    Culture >=100,000 COLONIES/mL KLEBSIELLA PNEUMONIAE (A)  Final   Report Status 03/13/2020 FINAL  Final   Organism ID, Bacteria KLEBSIELLA PNEUMONIAE (A)  Final      Susceptibility   Klebsiella pneumoniae - MIC*    AMPICILLIN RESISTANT Resistant     CEFAZOLIN <=4 SENSITIVE Sensitive     CEFTRIAXONE <=1 SENSITIVE Sensitive     CIPROFLOXACIN <=0.25 SENSITIVE Sensitive     GENTAMICIN <=1 SENSITIVE Sensitive     IMIPENEM 1 SENSITIVE Sensitive     NITROFURANTOIN 64 INTERMEDIATE Intermediate     TRIMETH/SULFA >=320 RESISTANT Resistant     AMPICILLIN/SULBACTAM <=2 SENSITIVE Sensitive     PIP/TAZO <=4 SENSITIVE Sensitive     * >=100,000 COLONIES/mL KLEBSIELLA PNEUMONIAE  Blood Culture (routine x 2)     Status: None (Preliminary result)   Collection Time: 03/11/20  2:57 PM   Specimen: Right Antecubital; Blood  Result Value Ref Range Status    Specimen Description RIGHT ANTECUBITAL  Final   Special Requests   Final    BOTTLES DRAWN AEROBIC ONLY Blood Culture results may not be optimal due to an inadequate volume of blood received in culture bottles   Culture   Final    NO GROWTH 2 DAYS Performed at [facility], [address] [place], [place] [address]    Report Status PENDING  Incomplete  Blood Culture (routine x 2)     Status: None (Preliminary result)   Collection Time: 03/11/20  2:57 PM   Specimen: BLOOD RIGHT HAND  Result Value Ref Range Status   Specimen Description BLOOD RIGHT HAND  Final   Special Requests   Final    BOTTLES DRAWN AEROBIC AND ANAEROBIC Blood Culture results may not be optimal due to an inadequate volume of blood received in culture bottles   Culture   Final    NO GROWTH 2 DAYS Performed at [facility], [address] [place], [place] [address]    Report Status PENDING  Incomplete  Respiratory Panel by RT PCR (Flu A&B, Covid) - Nasopharyngeal Swab     Status: None   Collection Time: 03/11/20  4:30 PM   Specimen: Nasopharyngeal Swab  Result Value Ref Range Status   SARS Coronavirus 2 by RT PCR NEGATIVE NEGATIVE Final    Comment: (NOTE) SARS-CoV-2 target nucleic acids are NOT DETECTED. The SARS-CoV-2 RNA is generally detectable in upper respiratoy specimens during the acute phase of infection. The lowest concentration of SARS-CoV-2 viral copies this assay can detect is 131 copies/mL. A negative result does not preclude SARS-Cov-2 infection and should not be used as the sole basis for treatment or other patient management decisions. A negative result may occur with  improper specimen collection/handling, submission of specimen other than nasopharyngeal swab, presence of viral mutation(s) within the areas targeted by this assay, and inadequate number of viral copies (<131 copies/mL). A negative result must be combined with clinical observations, patient history, and epidemiological information.  The expected result is Negative. Fact Sheet for Patients:  https://www.Quiett.com/ Fact Sheet for Healthcare Providers:  https://www.young.biz/ This test is not yet ap proved or cleared by the [place] FDA and  has been authorized for detection and/or diagnosis of SARS-CoV-2 by FDA under an Emergency Use Authorization (EUA). This EUA will remain  in effect (meaning this test can be used) for the duration  of the COVID-19 declaration under Section 564(b)(1) of the Act, 21 U.S.C. section 360bbb-3(b)(1), unless the authorization is terminated or revoked sooner.    Influenza A by PCR NEGATIVE NEGATIVE Final   Influenza B by PCR NEGATIVE NEGATIVE Final    Comment: (NOTE) The Xpert Xpress SARS-CoV-2/FLU/RSV assay is intended as an aid in  the diagnosis of influenza from Nasopharyngeal swab specimens and  should not be used as a sole basis for treatment. Nasal washings and  aspirates are unacceptable for Xpert Xpress SARS-CoV-2/FLU/RSV  testing. Fact Sheet for Patients: PinkCheek.be Fact Sheet for Healthcare Providers: GravelBags.it This test is not yet approved or cleared by the [place] FDA and  has been authorized for detection and/or diagnosis of SARS-CoV-2 by  FDA under an Emergency Use Authorization (EUA). This EUA will remain  in effect (meaning this test can be used) for the duration of the  Covid-19 declaration under Section 564(b)(1) of the Act, 21  U.S.C. section 360bbb-3(b)(1), unless the authorization is  terminated or revoked. Performed at [facility], [address], [place], [place] [address]   MRSA PCR Screening     Status: Abnormal   Collection Time: 03/11/20  4:43 PM   Specimen: Nasal Mucosa; Nasopharyngeal  Result Value Ref Range Status   MRSA by PCR POSITIVE (A) NEGATIVE Final    Comment:        The GeneXpert MRSA Assay (FDA approved for NASAL  specimens only), is one component of a comprehensive MRSA colonization surveillance program. It is not intended to diagnose MRSA infection nor to guide or monitor treatment for MRSA infections. RESULT CALLED TO, READ BACK BY AND VERIFIED WITH: KILMER,T ON 03/11/20 AT 2155 BY LOY,C Performed at [facility], [address] [place], [place] [address]     Radiology Reports CT Head Wo Contrast  Result Date: 03/11/2020 CLINICAL DATA:  Fall from wheelchair yesterday while on blood thinners, altered mental status EXAM: CT HEAD WITHOUT CONTRAST TECHNIQUE: Contiguous axial images were obtained from the base of the skull through the vertex without intravenous contrast. COMPARISON:  11/28/2019 FINDINGS: Brain: Chronic atrophic and ischemic changes are again seen and stable. No findings to suggest acute hemorrhage, acute infarction or space-occupying mass lesion are noted. Stable old infarcts in the left MCA distribution are again seen. Vascular: No hyperdense vessel or unexpected calcification. Skull: Normal. Negative for fracture or focal lesion. Sinuses/Orbits: Mild opacification of the posterior left ethmoid air cells is again noted. Other: None IMPRESSION: Chronic atrophic and ischemic changes without acute abnormality. Electronically Signed   By: Inez Catalina M.D.   On: 03/11/2020 15:51   DG Chest Port 1 View  Result Date: 03/11/2020 CLINICAL DATA:  79 year old female with fever and cough. Fall from wheelchair yesterday. Negative for COVID-19 last year, being retested today. EXAM: PORTABLE CHEST 1 VIEW COMPARISON:  Portable chest 11/07/2019 and earlier. FINDINGS: Portable AP semi upright view at 1432 hours. New confluent abnormal opacities in the left lung about the hilum, and at the right lung base. Evidence of superimposed right pleural effusion, including some fluid suspected in the right minor fissure. The left lung base and right upper lobe remain well pneumatized. Lower lung volumes overall.  Stable mediastinal contours. Visualized tracheal air column is within normal limits. Prior cervical ACDF. No acute osseous abnormality identified. IMPRESSION: 1. Confluent new left perihilar and right lung base opacity most suspicious for multilobar pneumonia. Aspiration is also a consideration. 2. Superimposed small right pleural effusion suspected. Electronically Signed   By: Lemmie Evens  Margo AyeHall M.D.   On: 03/11/2020 15:14   DG Swallowing Func-Speech Pathology  Result Date: 03/13/2020 Objective Swallowing Evaluation: Type of Study: MBS-Modified Barium Swallow Study  Patient Details Name: Holly Sparks MRN: [id number] Date of Birth: [birth year] Today's Date: 03/13/2020 Time: SLP Start Time (ACUTE ONLY): 0901 -SLP Stop Time (ACUTE ONLY): 19140922 SLP Time Calculation (min) (ACUTE ONLY): 21 min Past Medical History: Past Medical History: Diagnosis Date  Diabetes (HCC)   Type 2 x 10 yrs  Dysrhythmia   AFib  GERD (gastroesophageal reflux disease)   Grade I diastolic dysfunction 09/03/2018  High cholesterol   Hypertension   x 10 yrs  Paroxysmal a-fib: Remote hx of afib 06/15/2015  Stroke [facility](HCC)   no deficits Past Surgical History: Past Surgical History: Procedure Laterality Date  CATARACT EXTRACTION W/PHACO Left 06/06/2016  Procedure: CATARACT EXTRACTION PHACO AND INTRAOCULAR LENS PLACEMENT (IOC);  Surgeon: Gemma PayorKerry Hunt, MD;  Location: [facility] ORS;  Service: Ophthalmology;  Laterality: Left;  CDE: 8.32  COLONOSCOPY    COLONOSCOPY N/A 05/08/2013  Procedure: COLONOSCOPY;  Surgeon: Malissa HippoNajeeb U Rehman, MD;  Location: [facility] ENDO SUITE;  Service: Endoscopy;  Laterality: N/A;  1015  NECK SURGERY    x 2 for spur and disc problems HPI: Holly HumphreyCatherine Sparks  is a 79 y.o. female, withhistory significant for diabetes, GERD, hypertension and history of atrial fibrillation on Eliquis, history of CVA with expressive aphasia and dementia, hypertension, hyperlipidemia, patient is a resident at SNF, she presents from SNF to evaluation for increased lethargy  and generalized weakness, and fall yesterday, apparently patient fell from her wheelchair yesterday, where she had multiple bruising, she is on Eliquis, as well today she was noted to be more sleepy and less interactive today, she was sent for further evaluation, especially she struck her forehead when she fell from the chair, patient is poor historian, she herself cannot provide any complaints. In the ED she was noted to have multiple bruising from her fall, and forehead and right periorbital area, CT head with no evidence of bleed or any acute findings, she was noted to be febrile 100.8, as well patient was noted to have cough, chest x-ray was significant for right lower base opacity, and she had elevated lactic acid at 2.2, her urinalysis was negative, patient received broad-spectrum antibiotic in ED, blood cultures were done and [facility] was called to admit. BSE 4/15 indicated need for MBS  Subjective: "His nanme is Reita ClicheBobby." (husband) Assessment / Plan / Recommendation CHL IP CLINICAL IMPRESSIONS 03/13/2020 Clinical Impression Pt presents with moderate sensorimotor pharyngeal dysphagia characterized by penetration of most all presentations of liquids (thin, NTL and HTL)  during the swallow and eventual silent aspiration of (trace to moderate amounts) residuals after the swallow. Pt demonstrates good hyolaryngeal elevation however note inadequate laryngeal vestibule closure and decreased pharyngeal squeeze during the swallow consequently resulting in residuals visualized in valleculae, laryngeal vestibule and interarytenoid space which are eventually aspirated at varying levels of severity. Chin tuck and effortful swallow were attempted and both ineffective in decreasing residue, or in limiting or eliminating penetration/aspiration.  Note larger amounts of penetration/aspiration with thicker consistencies despite providing varying volumes. Penetration but no aspiration was noted with puree and solid textures;  moderate residue of solids in the valleculae was cleared with a reflexive additional swallow. The smallest amount of laryngeal vestibule residue (which is eventually aspirated) was noted with tsp sips of thin liquids. Recommend D3/mech soft and upgrade to thin liquids with administration by TSP. Recommend meds to be administered  whole with puree. Recommend ST to continue to follow and to f/u at d/c location to implement dysphagia therapy with laryngeal strengthening exercises. Communicated with MD who will also clarify goals of care. SLP Visit Diagnosis Dysphagia, pharyngeal phase (R13.13) Attention and concentration deficit following -- Frontal lobe and executive function deficit following -- Impact on safety and function Moderate aspiration risk   CHL IP TREATMENT RECOMMENDATION 03/13/2020 Treatment Recommendations Therapy as outlined in treatment plan below   Prognosis 03/13/2020 Prognosis for Safe Diet Advancement Fair Barriers to Reach Goals Severity of deficits Barriers/Prognosis Comment -- CHL IP DIET RECOMMENDATION 03/13/2020 SLP Diet Recommendations Dysphagia 3 (Mech soft) solids;Thin liquid Liquid Administration via Spoon Medication Administration Whole meds with puree Compensations Slow rate;Small sips/bites Postural Changes Remain semi-upright after after feeds/meals (Comment);Seated upright at 90 degrees   CHL IP OTHER RECOMMENDATIONS 03/13/2020 Recommended Consults -- Oral Care Recommendations Oral care BID Other Recommendations Clarify dietary restrictions   CHL IP FOLLOW UP RECOMMENDATIONS 03/13/2020 Follow up Recommendations Skilled Nursing facility   [facility] IP FREQUENCY AND DURATION 03/13/2020 Speech Therapy Frequency (ACUTE ONLY) min 2x/week Treatment Duration 1 week      CHL IP ORAL PHASE 03/13/2020 Oral Phase WFL Oral - Pudding Teaspoon -- Oral - Pudding Cup -- Oral - Honey Teaspoon -- Oral - Honey Cup -- Oral - Nectar Teaspoon -- Oral - Nectar Cup -- Oral - Nectar Straw -- Oral - Thin Teaspoon -- Oral -  Thin Cup -- Oral - Thin Straw -- Oral - Puree -- Oral - Mech Soft -- Oral - Regular -- Oral - Multi-Consistency -- Oral - Pill -- Oral Phase - Comment --  CHL IP PHARYNGEAL PHASE 03/13/2020 Pharyngeal Phase -- Pharyngeal- Pudding Teaspoon -- Pharyngeal -- Pharyngeal- Pudding Cup -- Pharyngeal -- Pharyngeal- Honey Teaspoon -- Pharyngeal -- Pharyngeal- Honey Cup -- Pharyngeal Material enters airway, passes BELOW cords without attempt by patient to eject out (silent aspiration) Pharyngeal- Nectar Teaspoon -- Pharyngeal -- Pharyngeal- Nectar Cup -- Pharyngeal Material enters airway, passes BELOW cords without attempt by patient to eject out (silent aspiration) Pharyngeal- Nectar Straw -- Pharyngeal -- Pharyngeal- Thin Teaspoon -- Pharyngeal Material enters airway, passes BELOW cords without attempt by patient to eject out (silent aspiration) Pharyngeal- Thin Cup -- Pharyngeal Material enters airway, passes BELOW cords without attempt by patient to eject out (silent aspiration) Pharyngeal- Thin Straw -- Pharyngeal Material enters airway, passes BELOW cords without attempt by patient to eject out (silent aspiration) Pharyngeal- Puree -- Pharyngeal -- Pharyngeal- Mechanical Soft -- Pharyngeal -- Pharyngeal- Regular -- Pharyngeal -- Pharyngeal- Multi-consistency -- Pharyngeal -- Pharyngeal- Pill -- Pharyngeal -- Pharyngeal Comment --  No flowsheet data found. Amelia H. Romie Levee, CCC-SLP Speech Language Pathologist Georgetta Haber 03/13/2020, 12:16 PM                CBC Recent Labs  Lab 03/11/20 1457 03/12/20 0457  WBC 5.8 7.1  HGB 9.4* 9.4*  HCT 28.7* 30.1*  PLT 130* 276  MCV 99.3 99.7  MCH 32.5 31.1  MCHC 32.8 31.2  RDW 20.5* 20.0*  LYMPHSABS 1.2  --   MONOABS 0.3  --   EOSABS 0.1  --   BASOSABS 0.0  --     Chemistries  Recent Labs  Lab 03/11/20 1457 03/12/20 0457  NA 137 138  K 4.7 4.0  CL 101 104  CO2 25 24  GLUCOSE 102* 83  BUN 21 19  CREATININE 0.89 0.83  CALCIUM 8.2* 7.8*  AST  20  --  ALT 11  --   ALKPHOS 121  --   BILITOT 0.8  --    ------------------------------------------------------------------------------------------------------------------ No results for input(s): CHOL, HDL, LDLCALC, TRIG, CHOLHDL, LDLDIRECT in the last 72 hours.  Lab Results  Component Value Date   HGBA1C 6.5 (H) 03/12/2020   ------------------------------------------------------------------------------------------------------------------ No results for input(s): TSH, T4TOTAL, T3FREE, THYROIDAB in the last 72 hours.  Invalid input(s): FREET3 ------------------------------------------------------------------------------------------------------------------ Recent Labs    03/11/20 1457  VITAMINB12 110*  FOLATE 6.2  FERRITIN 45  TIBC 281  IRON 19*  RETICCTPCT 2.8    Coagulation profile Recent Labs  Lab 03/11/20 1631  INR 1.7*    No results for input(s): DDIMER in the last 72 hours.  Cardiac Enzymes No results for input(s): CKMB, TROPONINI, MYOGLOBIN in the last 168 hours.  Invalid input(s): CK ------------------------------------------------------------------------------------------------------------------    Component Value Date/Time   BNP 241.0 (H) 04/20/2018 5809     Shon Hale M.D on 03/13/2020 at 12:28 PM  Go to [email or web address] - for contact info  [facility] - Office  [phone]

## 2020-03-13 NOTE — Clinical Social Work Note (Signed)
From [facility]. In Memory care unit. Not steady on feet at baseline per Marcelino Duster at [facility]. Requires assistance with bathing, dressing and bathroom. Needs w/c. W/c orders are in. Palliative consult placed. Marcelino Duster at [facility] aware of weekend d/c.

## 2020-03-13 NOTE — Evaluation (Signed)
  Modified Barium Swallow Progress Note  Patient Details  Name: EIMI VINEY MRN: [id number] Date of Birth: 12-15-1940  Today's Date: 03/13/2020  Modified Barium Swallow completed.  Full report located under Chart Review in the Imaging Section.  Brief recommendations include the following:  Clinical Impression  Pt presents with moderate sensorimotor pharyngeal dysphagia characterized by penetration of most all presentations of liquids (thin, NTL and HTL)  during the swallow and eventual silent aspiration of (trace to moderate amounts) residuals after the swallow. Pt demonstrates good hyolaryngeal elevation however note inadequate laryngeal vestibule closure and decreased pharyngeal squeeze during the swallow consequently resulting in residuals visualized in valleculae, laryngeal vestibule and interarytenoid space which are eventually aspirated at varying levels of severity. Chin tuck and effortful swallow were attempted and both ineffective in decreasing residue, or in limiting or eliminating penetration/aspiration.  Note larger amounts of penetration/aspiration with thicker consistencies despite providing varying volumes. Penetration but no aspiration was noted with puree and solid textures; moderate residue of solids in the valleculae was cleared with a reflexive additional swallow. The smallest amount of laryngeal vestibule residue (which is eventually aspirated) was noted with tsp sips of thin liquids. Recommend D3/mech soft and upgrade to thin liquids with administration by TSP. Recommend meds to be administered whole with puree. Recommend ST to continue to follow and to f/u at d/c location to implement dysphagia therapy with laryngeal strengthening exercises. Communicated with MD who will also clarify goals of care.   Swallow Evaluation Recommendations       SLP Diet Recommendations: Dysphagia 3 (Mech soft) solids;Thin liquid   Liquid Administration via: Spoon   Medication  Administration: Whole meds with puree   Supervision: Staff to assist with self feeding;Full supervision/cueing for compensatory strategies   Compensations: Slow rate;Small sips/bites   Postural Changes: Remain semi-upright after after feeds/meals (Comment);Seated upright at 90 degrees   Oral Care Recommendations: Oral care BID   Other Recommendations: Clarify dietary restrictions   Afsana Liera H. Romie Levee, CCC-SLP Speech Language Pathologist  Georgetta Haber 03/13/2020,12:12 PM

## 2020-03-13 NOTE — Care Management Important Message (Signed)
Important Message  Patient Details  Name: Holly Sparks MRN: [id number] Date of Birth: 1941-07-17   Medicare Important Message Given:  Yes     Corey Harold 03/13/2020, 12:34 PM

## 2020-03-13 NOTE — Progress Notes (Signed)
OT Cancellation Note  Patient Details Name: MARCELLE HEPNER MRN: [id number] DOB: [birth year]   Cancelled Treatment:    Reason Eval/Treat Not Completed: Patient at procedure or test/ unavailable. Will re-attempt evaluation when able.    Limmie Patricia, OTR/L,CBIS  [phone]  03/13/2020, 9:22 AM

## 2020-03-13 NOTE — Progress Notes (Signed)
Dressings changed to legs and feet.  No odor or drainage.  Husband fed wife about 50% of supper and he reported no choking.

## 2020-03-13 NOTE — Clinical Social Work Note (Signed)
Patient has mobility limitations that impair their ability to do one or more mobility-related activities of daily living in customary locations in the home. Patient cannot use crutches, cane, or walker to resolve the issue sufficiently, patient can safely self propel the lightweight wheelchair in the home or has a caregiver that can assist.

## 2020-03-13 NOTE — Consult Note (Signed)
Consultation Note Date: 03/13/2020   Patient Name: Holly Sparks  DOB: [birth year]  MRN: [id number]  Age / Sex: 79 y.o., female  PCP: Lemmie Evens, MD Referring Physician: Roxan Hockey, MD  Reason for Consultation: Establishing goals of care  HPI/Patient Profile: 79 y.o. female  with past medical history of CVA, vascular dementia,  admitted on 03/11/2020 with fall. Workup reveals aspiration pneumonia, significant dysphagia. Palliative medicine consulted for goals of care.    Clinical Assessment and Goals of Care: I reviewed patient's chart, evaluated patient and met with patient's spouse.  We discussed her illness trajectory, significant advancing dementia, now with ongoing aspiration resulting in pneumonia.  Discussed patient's values and goals of care. Katelynn has always valued her independence and her strong mind. 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 Living in the SNF with ongoing dementia has been hard for her and her spouse. Mr. Ebersole notes that this is not the life that Elise would want to live and would not want aggressive prolonging measures.   Discussed likelihood of recurrent pneumonia due to ongoing dysphagia- advanced care planning and anticipatory care decisions discussed.  Patient has DNR in place.  We discussed continued aggressive care, repeat hospitalizations, vs comfort measures, hospice.  Patient would be appropriate for hospice due to dysphagia and poor po intake.   Primary Decision Maker NEXT OF KIN- patient's spouse    SUMMARY OF RECOMMENDATIONS -Cont current care- finish antibiotics then possibly d/c back to [facility] with Hospice- patient's spouse to discuss with his children and let attending MD know tomorrow    Code Status/Advance Care Planning:  DNR  Palliative Prophylaxis:   Aspiration  Prognosis:    < 6 months  Discharge  Planning: To Be Determined - likely home with  Hospice  Primary Diagnoses: Present on Admission: . Atrial fibrillation with RVR ([place]) . Essential hypertension, benign . Expressive aphasia . Left middle cerebral artery stroke ([place]) . 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 The following aspects are pertinent.  Past Medical History:  Diagnosis Date  . Diabetes ([place])    Type 2 x 10 yrs  . Dysrhythmia    AFib  . GERD (gastroesophageal reflux disease)   . Grade I diastolic dysfunction 58/03/2777  . High cholesterol   . Hypertension    x 10 yrs  . Paroxysmal a-fib: Remote hx of afib 06/15/2015  . Stroke [facility])    no deficits   Social History   Socioeconomic History  . Marital status: Married    Spouse name: Not on file  . Number of children: Not on file  . Years of education: Not on file  . Highest education level: Not on file  Occupational History  . Not on file  Tobacco Use  . Smoking status: Never Smoker  . Smokeless tobacco: Never Used  Substance and Sexual Activity  . Alcohol use: No    Alcohol/week:  0.0 standard drinks  . Drug use: No  . Sexual activity: Not Currently    Birth control/protection: None  Other Topics Concern  . Not on file  Social History Narrative  . Not on file   Social Determinants of Health   Financial Resource Strain:   . Difficulty of Paying Living Expenses:   Food Insecurity:   . Worried About Charity fundraiser in the Last Year:   . Arboriculturist in the Last Year:   Transportation Needs:   . Film/video editor (Medical):   Marland Kitchen Lack of Transportation (Non-Medical):   Physical Activity:   . Days of Exercise per Week:   . Minutes of Exercise per Session:   Stress:   . Feeling of Stress :   Social Connections:   . Frequency of Communication with Friends and Family:   . Frequency of Social Gatherings with Friends  and Family:   . Attends Religious Services:   . Active Member of Clubs or Organizations:   . Attends Archivist Meetings:   Marland Kitchen Marital Status:    Family History  Problem Relation Age of Onset  . Diabetes Father    Scheduled Meds: . apixaban  2.5 mg Oral BID  . diltiazem  120 mg Oral Daily  . hydrocerin   Topical Daily  . insulin aspart  0-6 Units Subcutaneous Q4H  . metoprolol tartrate  12.5 mg Oral BID   Continuous Infusions: . sodium chloride 75 mL/hr at 03/11/20 1954  . sodium chloride 75 mL/hr at 03/11/20 1706  . piperacillin-tazobactam (ZOSYN)  IV 3.375 g (03/13/20 1316)   PRN Meds:.acetaminophen **OR** acetaminophen, albuterol Medications Prior to Admission:  Prior to Admission medications   Medication Sig Start Date End Date Taking? Authorizing Provider  acetaminophen (TYLENOL) 325 MG tablet Take 650 mg by mouth every 6 (six) hours as needed for mild pain, moderate pain or fever.    Yes [provider]  amitriptyline (ELAVIL) 25 MG tablet Take 12.5 mg by mouth at bedtime.   Yes [provider]  apixaban (ELIQUIS) 5 MG TABS tablet Take 1 tablet (5 mg total) by mouth 2 (two) times daily. 06/26/15  Yes Angiulli, Lavon Paganini, PA-C  diltiazem (CARDIZEM CD) 120 MG 24 hr capsule Take 1 capsule (120 mg total) by mouth daily. 01/21/18  Yes Milton Ferguson, MD  Doxepin HCl 3 MG TABS Take 3 mg by mouth at bedtime.   Yes [provider]  furosemide (LASIX) 20 MG tablet Take 10 mg by mouth daily.   Yes [provider]  magnesium oxide (MAG-OX) 400 MG tablet Take 1 tablet (400 mg total) by mouth daily. 11/08/19  Yes Barton Dubois, MD  metFORMIN (GLUCOPHAGE) 1000 MG tablet Take 1,000 mg by mouth 2 (two) times daily with a meal.    Yes [provider]  metoprolol tartrate (LOPRESSOR) 25 MG tablet Take 12.5 mg by mouth 2 (two) times daily.   Yes [provider]  ondansetron (ZOFRAN) 4 MG tablet Take 4 mg by mouth every 6 (six) hours  as needed for nausea or vomiting.   Yes [provider]  pantoprazole (PROTONIX) 40 MG tablet Take 40 mg by mouth daily.   Yes [provider]  potassium chloride (KLOR-CON) 10 MEQ tablet Take 2 tablets (20 mEq total) by mouth daily. 11/08/19  Yes Barton Dubois, MD   No Known Allergies Review of Systems  Physical Exam Vitals reviewed.  HENT:     Head:  Comments: Facial bruising Pulmonary:     Effort: Pulmonary effort is normal.  Skin:    General: Skin is warm and dry.     Coloration: Skin is pale.  Neurological:     Mental Status: She is alert.  Psychiatric:     Comments: Pleasantly confused     Vital Signs: BP 121/70 (BP Location: Left Arm)   Pulse 94   Temp 97.9 F (36.6 C)   Resp 20   Ht 5' 5" (1.651 m)   Wt 57.2 kg   SpO2 96%   BMI 20.98 kg/m  Pain Scale: 0-10   Pain Score: 0-No pain   SpO2: SpO2: 96 % O2 Device:SpO2: 96 % O2 Flow Rate: .   IO: Intake/output summary: No intake or output data in the 24 hours ending 03/13/20 1718  LBM: Last BM Date: 03/12/20 Baseline Weight: Weight: 57.2 kg Most recent weight: Weight: 57.2 kg     Palliative Assessment/Data: PPS: 20%     Thank you for this consult. Palliative medicine will continue to follow and assist as needed.   Time In: 1400 Time Out: 1515 Time Total: 75 mins Greater than 50%  of this time was spent counseling and coordinating care related to the above assessment and plan.  Signed by: Mariana Kaufman, AGNP-C Palliative Medicine    Please contact Palliative Medicine Team phone at [phone] for questions and concerns.  For individual provider: See Shea Evans

## 2020-03-14 LAB — GLUCOSE, CAPILLARY
Glucose-Capillary: 85 mg/dL (ref 70–99)
Glucose-Capillary: 170 mg/dL — ABNORMAL HIGH (ref 70–99)
Glucose-Capillary: 112 mg/dL — ABNORMAL HIGH (ref 70–99)
Glucose-Capillary: 182 mg/dL — ABNORMAL HIGH (ref 70–99)

## 2020-03-14 MED ORDER — MUPIROCIN 2 % EX OINT
1.0000 "application " | TOPICAL_OINTMENT | Freq: Two times a day (BID) | CUTANEOUS | Status: DC
  Administered 2020-03-14 – 2020-03-18 (×7): 1 via NASAL
  Filled 2020-03-14: qty 22

## 2020-03-14 MED ORDER — APIXABAN 5 MG PO TABS
5.0000 mg | ORAL_TABLET | Freq: Two times a day (BID) | ORAL | Status: DC
  Administered 2020-03-14 – 2020-03-18 (×8): 5 mg via ORAL
  Filled 2020-03-14 (×8): qty 1

## 2020-03-14 MED ORDER — CHLORHEXIDINE GLUCONATE CLOTH 2 % EX PADS
6.0000 | MEDICATED_PAD | Freq: Every day | CUTANEOUS | Status: DC
  Administered 2020-03-16 – 2020-03-18 (×3): 6 via TOPICAL

## 2020-03-14 NOTE — Progress Notes (Signed)
Patient Demographics:    Holly Sparks, is a 79 y.o. female, DOB - 08/18/1941, SAY:[id number]  Admit date - 03/11/2020   Admitting Physician Starleen Arms, MD  Outpatient Primary MD for the patient is Gareth Morgan, MD  LOS - 3   Chief Complaint  Patient presents with   Fall        Subjective:    Holly Sparks continues to be short of breath.  She does have a cough.  P.o. intake has been fair.  Staff notes that she does cough after p.o. intake.   Assessment  & Plan :    Active Problems:   Diabetes (HCC)   Essential hypertension, benign   Expressive aphasia   Left middle cerebral artery stroke (HCC)   Chronic anticoagulation   Type 2 diabetes mellitus with circulatory disorder (HCC)   Atrial fibrillation with RVR (HCC)   Sepsis (HCC)   Brief Summary 79 y.o. female, withhistory significant for diabetes, GERD, hypertension and history of atrial fibrillation on Eliquis, history of CVA with expressive aphasia and dementia, hypertension, hyperlipidemia -Admitted from [facility] after falling out of her wheelchair while on Eliquis with concerns for lethargy and metabolic encephalopathy and sepsis secondary to pneumonia   A/p 1) sepsis secondary to pneumonia--- -received Rocephin and vancomycin in the ED,  currently on IV Zosyn -Plan is to discharge on oral Augmentin -Dyspnea and cough is not worse  2)acute metabolic encephalopathy--- secondary to #1 above,  superimposed on underlying dementia--treat sepsis as above #1  3) status post fall from wheelchair prior to admission while on Eliquis--- multiple bruises/abrasions to bilateral upper and lower extremities and face greater on the right side--- POA -Wound care specialist consult appreciated -cleanse wounds  with soap and water, rinse and pa t day, followed by application of a thin layer of a humectant, Eucerin. Open lesions  will be covered with the moisture retentive, nonadherent, antimicrobial, xeroform and topped with a dry dressing. A Kerlix roll gauze wrapped from just below toes to just below knees will secure dressings and protect the LEs from inadvertent trauma. Bilateral feet will be placed into pressure redistribution heel boots. A pressure redistribution chair pad is provided for her for when OOB in the chair at her facility post discharge.  A sacral prophylactic foam dressing to the sacrum is to be placed while in house.  4) A. fib with RVR--- history of TIA/CVA-she is anticoagulated with Eliquis -c/n  Cardizem CD and p.o. metoprolol for rate control.  Her son reports that she has fallen 10 times since being at assisted living facility.  Will need to have further conversations regarding risks/benefits of continuing anticoagulation.  5)DM2-A1c 6.5, reflecting excellent DM control PTA -Continue to hold Metformin, Use Novolog/Humalog Sliding scale insulin with Accu-Cheks/Fingersticks as ordered  6)Dysphagia--- Speech eval appreciated, patient appears to be having penetration with both thick and thin liquids.  She would be considered high risk for aspiration. - Dysphagia 3 (Mech soft) solids;Thin liquid  7)Social/Ethics-- GOC----dementia--- and poor oral intake swallowing  8)Social/Ethics--palliative care consult appreciated,  --Extensive discussion with husband by palliative care service --Unfortunately, it appears that husband may not have completely grasped conversation and was not able to relay the details of the conversation to me today -I  had another goals of care conversation with the patient's son, Holly Sparks who is power of attorney -Holly Sparks would like to discuss with the rest of his family prior to making any further decisions. -DNR/DNI  Disposition/Need for in-Hospital Stay- patient unable to be discharged at this time due to --sepsis secondary to pneumonia requiring IV antibiotics with some metabolic  encephalopathy -[facility]C Place: ALF versus SNF--- possibly with de-escalation of care/palliative care involvement and focus on comfort Barriers: Not Clinically Stable-sepsis secondary to pneumonia and acute encephalopathy with uncontrolled A. fib - Code Status : DNR  Family Communication:  -discussed with husband and son Holly Sparks who is POA possibly with de-escalation of care/palliative care involvement and focus on comfort)  Consults  : Speech pathologist/palliative care  DVT Prophylaxis  : Eliquis  Lab Results  Component Value Date   PLT 276 03/12/2020    Inpatient Medications  Scheduled Meds:  apixaban  5 mg Oral BID   [START ON 03/15/2020] Chlorhexidine Gluconate Cloth  6 each Topical Q0600   diltiazem  120 mg Oral Daily   hydrocerin   Topical Daily   insulin aspart  0-6 Units Subcutaneous Q4H   metoprolol tartrate  12.5 mg Oral BID   mupirocin ointment  1 application Nasal BID   Continuous Infusions:  sodium chloride 75 mL/hr at 03/11/20 1954   piperacillin-tazobactam (ZOSYN)  IV 3.375 g (03/14/20 1312)   PRN Meds:.acetaminophen **OR** acetaminophen, albuterol    Anti-infectives (From admission, onward)   Start     Dose/Rate Route Frequency Ordered Stop   03/11/20 1630  piperacillin-tazobactam (ZOSYN) IVPB 3.375 g     3.375 g 12.5 mL/hr over 240 Minutes Intravenous Every 8 hours 03/11/20 1622     03/11/20 1615  metroNIDAZOLE (FLAGYL) IVPB 500 mg  Status:  Discontinued     500 mg 100 mL/hr over 60 Minutes Intravenous Every 8 hours 03/11/20 1614 03/11/20 1622   03/11/20 1500  vancomycin (VANCOCIN) IVPB 1000 mg/200 mL premix     1,000 mg 200 mL/hr over 60 Minutes Intravenous  Once 03/11/20 1451 03/11/20 1650   03/11/20 1430  cefTRIAXone (ROCEPHIN) 1 g in sodium chloride 0.9 % 100 mL IVPB     1 g 200 mL/hr over 30 Minutes Intravenous  Once 03/11/20 1419 03/11/20 1539        Objective:   Vitals:   03/13/20 2057 03/14/20 0529  03/14/20 0820 03/14/20 1411  BP: 114/84 (!) 143/100  111/64  Pulse: 68 77  (!) 51  Resp: 16 20  18   Temp: 97.6 F (36.4 C) 98 F (36.7 C)  98.2 F (36.8 C)  TempSrc:  Axillary  Oral  SpO2: 93% 97% 96% 99%  Weight:      Height:        Wt Readings from Last 3 Encounters:  03/11/20 57.2 kg  11/28/19 58.1 kg  11/18/19 58.1 kg     Intake/Output Summary (Last 24 hours) at 03/14/2020 1700 Last data filed at 03/14/2020 1300 Gross per 24 hour  Intake 600 ml  Output --  Net 600 ml     Physical Exam  General exam: Alert, awake, sitting up in bed Respiratory system: bilateral rhonchi with mild increased respiratory effort Cardiovascular system:irregular. No murmurs, rubs, gallops. Gastrointestinal system: Abdomen is nondistended, soft and nontender. No organomegaly or masses felt. Normal bowel sounds heard. Central nervous system: Alert and oriented. Speech is dysarthric. Extremities: No C/C/E, +pedal pulses Skin: No rashes, lesions or ulcers Psychiatry: speech is difficult  to comprehend     Data Review:   Micro Results Recent Results (from the past 240 hour(s))  Urine culture     Status: Abnormal   Collection Time: 03/11/20  1:49 PM   Specimen: Urine, Catheterized  Result Value Ref Range Status   Specimen Description   Final    URINE, CATHETERIZED Performed at [facility], [address], [place], [place] [address]    Special Requests   Final    NONE Performed at [facility], [address], [place], [place] [address]    Culture >=100,000 COLONIES/mL KLEBSIELLA PNEUMONIAE (A)  Final   Report Status 03/13/2020 FINAL  Final   Organism ID, Bacteria KLEBSIELLA PNEUMONIAE (A)  Final      Susceptibility   Klebsiella pneumoniae - MIC*    AMPICILLIN RESISTANT Resistant     CEFAZOLIN <=4 SENSITIVE Sensitive     CEFTRIAXONE <=1 SENSITIVE Sensitive     CIPROFLOXACIN <=0.25 SENSITIVE Sensitive     GENTAMICIN <=1 SENSITIVE Sensitive     IMIPENEM 1 SENSITIVE Sensitive      NITROFURANTOIN 64 INTERMEDIATE Intermediate     TRIMETH/SULFA >=320 RESISTANT Resistant     AMPICILLIN/SULBACTAM <=2 SENSITIVE Sensitive     PIP/TAZO <=4 SENSITIVE Sensitive     * >=100,000 COLONIES/mL KLEBSIELLA PNEUMONIAE  Blood Culture (routine x 2)     Status: None (Preliminary result)   Collection Time: 03/11/20  2:57 PM   Specimen: Right Antecubital; Blood  Result Value Ref Range Status   Specimen Description RIGHT ANTECUBITAL  Final   Special Requests   Final    BOTTLES DRAWN AEROBIC ONLY Blood Culture results may not be optimal due to an inadequate volume of blood received in culture bottles   Culture   Final    NO GROWTH 3 DAYS Performed at [facility], [address] [place], [place] [address]    Report Status PENDING  Incomplete  Blood Culture (routine x 2)     Status: None (Preliminary result)   Collection Time: 03/11/20  2:57 PM   Specimen: BLOOD RIGHT HAND  Result Value Ref Range Status   Specimen Description BLOOD RIGHT HAND  Final   Special Requests   Final    BOTTLES DRAWN AEROBIC AND ANAEROBIC Blood Culture results may not be optimal due to an inadequate volume of blood received in culture bottles   Culture   Final    NO GROWTH 3 DAYS Performed at [facility], [address], [place], [place] [address]    Report Status PENDING  Incomplete  Respiratory Panel by RT PCR (Flu A&B, Covid) - Nasopharyngeal Swab     Status: None   Collection Time: 03/11/20  4:30 PM   Specimen: Nasopharyngeal Swab  Result Value Ref Range Status   SARS Coronavirus 2 by RT PCR NEGATIVE NEGATIVE Final    Comment: (NOTE) SARS-CoV-2 target nucleic acids are NOT DETECTED. The SARS-CoV-2 RNA is generally detectable in upper respiratoy specimens during the acute phase of infection. The lowest concentration of SARS-CoV-2 viral copies this assay can detect is 131 copies/mL. A negative result does not preclude SARS-Cov-2 infection and should not be used as the sole basis for treatment  or other patient management decisions. A negative result may occur with  improper specimen collection/handling, submission of specimen other than nasopharyngeal swab, presence of viral mutation(s) within the areas targeted by this assay, and inadequate number of viral copies (<131 copies/mL). A negative result must be combined with clinical observations, patient history, and epidemiological  information. The expected result is Negative. Fact Sheet for Patients:  https://www.Danko.com/ Fact Sheet for Healthcare Providers:  https://www.young.biz/ This test is not yet ap proved or cleared by the [place] FDA and  has been authorized for detection and/or diagnosis of SARS-CoV-2 by FDA under an Emergency Use Authorization (EUA). This EUA will remain  in effect (meaning this test can be used) for the duration of the COVID-19 declaration under Section 564(b)(1) of the Act, 21 U.S.C. section 360bbb-3(b)(1), unless the authorization is terminated or revoked sooner.    Influenza A by PCR NEGATIVE NEGATIVE Final   Influenza B by PCR NEGATIVE NEGATIVE Final    Comment: (NOTE) The Xpert Xpress SARS-CoV-2/FLU/RSV assay is intended as an aid in  the diagnosis of influenza from Nasopharyngeal swab specimens and  should not be used as a sole basis for treatment. Nasal washings and  aspirates are unacceptable for Xpert Xpress SARS-CoV-2/FLU/RSV  testing. Fact Sheet for Patients: https://www.Batson.com/ Fact Sheet for Healthcare Providers: https://www.young.biz/ This test is not yet approved or cleared by the [place] FDA and  has been authorized for detection and/or diagnosis of SARS-CoV-2 by  FDA under an Emergency Use Authorization (EUA). This EUA will remain  in effect (meaning this test can be used) for the duration of the  Covid-19 declaration under Section 564(b)(1) of the Act, 21  U.S.C. section  360bbb-3(b)(1), unless the authorization is  terminated or revoked. Performed at [facility], [address] [place], [place] [address]   MRSA PCR Screening     Status: Abnormal   Collection Time: 03/11/20  4:43 PM   Specimen: Nasal Mucosa; Nasopharyngeal  Result Value Ref Range Status   MRSA by PCR POSITIVE (A) NEGATIVE Final    Comment:        The GeneXpert MRSA Assay (FDA approved for NASAL specimens only), is one component of a comprehensive MRSA colonization surveillance program. It is not intended to diagnose MRSA infection nor to guide or monitor treatment for MRSA infections. RESULT CALLED TO, READ BACK BY AND VERIFIED WITH: KILMER,T ON 03/11/20 AT 2155 BY LOY,C Performed at [facility], [address], [place], [place] [address]     Radiology Reports CT Head Wo Contrast  Result Date: 03/11/2020 CLINICAL DATA:  Fall from wheelchair yesterday while on blood thinners, altered mental status EXAM: CT HEAD WITHOUT CONTRAST TECHNIQUE: Contiguous axial images were obtained from the base of the skull through the vertex without intravenous contrast. COMPARISON:  11/28/2019 FINDINGS: Brain: Chronic atrophic and ischemic changes are again seen and stable. No findings to suggest acute hemorrhage, acute infarction or space-occupying mass lesion are noted. Stable old infarcts in the left MCA distribution are again seen. Vascular: No hyperdense vessel or unexpected calcification. Skull: Normal. Negative for fracture or focal lesion. Sinuses/Orbits: Mild opacification of the posterior left ethmoid air cells is again noted. Other: None IMPRESSION: Chronic atrophic and ischemic changes without acute abnormality. Electronically Signed   By: Alcide Clever M.D.   On: 03/11/2020 15:51   DG Chest Port 1 View  Result Date: 03/11/2020 CLINICAL DATA:  79 year old female with fever and cough. Fall from wheelchair yesterday. Negative for COVID-19 last year, being retested today. EXAM: PORTABLE CHEST 1  VIEW COMPARISON:  Portable chest 11/07/2019 and earlier. FINDINGS: Portable AP semi upright view at 1432 hours. New confluent abnormal opacities in the left lung about the hilum, and at the right lung base. Evidence of superimposed right pleural effusion, including some fluid suspected in the right minor fissure.  The left lung base and right upper lobe remain well pneumatized. Lower lung volumes overall. Stable mediastinal contours. Visualized tracheal air column is within normal limits. Prior cervical ACDF. No acute osseous abnormality identified. IMPRESSION: 1. Confluent new left perihilar and right lung base opacity most suspicious for multilobar pneumonia. Aspiration is also a consideration. 2. Superimposed small right pleural effusion suspected. Electronically Signed   By: Odessa Fleming M.D.   On: 03/11/2020 15:14   DG Swallowing Func-Speech Pathology  Result Date: 03/13/2020 Objective Swallowing Evaluation: Type of Study: MBS-Modified Barium Swallow Study  Patient Details Name: BAHAR SHELDEN MRN: [id number] Date of Birth: 06/27/1941 Today's Date: 03/13/2020 Time: SLP Start Time (ACUTE ONLY): 0901 -SLP Stop Time (ACUTE ONLY): 9147 SLP Time Calculation (min) (ACUTE ONLY): 21 min Past Medical History: Past Medical History: Diagnosis Date  Diabetes (HCC)   Type 2 x 10 yrs  Dysrhythmia   AFib  GERD (gastroesophageal reflux disease)   Grade I diastolic dysfunction 09/03/2018  High cholesterol   Hypertension   x 10 yrs  Paroxysmal a-fib: Remote hx of afib 06/15/2015  Stroke [facility])   no deficits Past Surgical History: Past Surgical History: Procedure Laterality Date  CATARACT EXTRACTION W/PHACO Left 06/06/2016  Procedure: CATARACT EXTRACTION PHACO AND INTRAOCULAR LENS PLACEMENT (IOC);  Surgeon: Gemma Payor, MD;  Location: [facility] ORS;  Service: Ophthalmology;  Laterality: Left;  CDE: 8.32  COLONOSCOPY    COLONOSCOPY N/A 05/08/2013  Procedure: COLONOSCOPY;  Surgeon: Malissa Hippo, MD;  Location: [facility] ENDO SUITE;   Service: Endoscopy;  Laterality: N/A;  1015  NECK SURGERY    x 2 for spur and disc problems HPI: Margia Wiesen  is a 79 y.o. female, withhistory significant for diabetes, GERD, hypertension and history of atrial fibrillation on Eliquis, history of CVA with expressive aphasia and dementia, hypertension, hyperlipidemia, patient is a resident at SNF, she presents from SNF to evaluation for increased lethargy and generalized weakness, and fall yesterday, apparently patient fell from her wheelchair yesterday, where she had multiple bruising, she is on Eliquis, as well today she was noted to be more sleepy and less interactive today, she was sent for further evaluation, especially she struck her forehead when she fell from the chair, patient is poor historian, she herself cannot provide any complaints. In the ED she was noted to have multiple bruising from her fall, and forehead and right periorbital area, CT head with no evidence of bleed or any acute findings, she was noted to be febrile 100.8, as well patient was noted to have cough, chest x-ray was significant for right lower base opacity, and she had elevated lactic acid at 2.2, her urinalysis was negative, patient received broad-spectrum antibiotic in ED, blood cultures were done and [facility] was called to admit. BSE 4/15 indicated need for MBS  Subjective: "His nanme is Reita Cliche." (husband) Assessment / Plan / Recommendation CHL IP CLINICAL IMPRESSIONS 03/13/2020 Clinical Impression Pt presents with moderate sensorimotor pharyngeal dysphagia characterized by penetration of most all presentations of liquids (thin, NTL and HTL)  during the swallow and eventual silent aspiration of (trace to moderate amounts) residuals after the swallow. Pt demonstrates good hyolaryngeal elevation however note inadequate laryngeal vestibule closure and decreased pharyngeal squeeze during the swallow consequently resulting in residuals visualized in valleculae, laryngeal vestibule and  interarytenoid space which are eventually aspirated at varying levels of severity. Chin tuck and effortful swallow were attempted and both ineffective in decreasing residue, or in limiting or eliminating penetration/aspiration.  Note larger amounts of  penetration/aspiration with thicker consistencies despite providing varying volumes. Penetration but no aspiration was noted with puree and solid textures; moderate residue of solids in the valleculae was cleared with a reflexive additional swallow. The smallest amount of laryngeal vestibule residue (which is eventually aspirated) was noted with tsp sips of thin liquids. Recommend D3/mech soft and upgrade to thin liquids with administration by TSP. Recommend meds to be administered whole with puree. Recommend ST to continue to follow and to f/u at d/c location to implement dysphagia therapy with laryngeal strengthening exercises. Communicated with MD who will also clarify goals of care. SLP Visit Diagnosis Dysphagia, pharyngeal phase (R13.13) Attention and concentration deficit following -- Frontal lobe and executive function deficit following -- Impact on safety and function Moderate aspiration risk   CHL IP TREATMENT RECOMMENDATION 03/13/2020 Treatment Recommendations Therapy as outlined in treatment plan below   Prognosis 03/13/2020 Prognosis for Safe Diet Advancement Fair Barriers to Reach Goals Severity of deficits Barriers/Prognosis Comment -- CHL IP DIET RECOMMENDATION 03/13/2020 SLP Diet Recommendations Dysphagia 3 (Mech soft) solids;Thin liquid Liquid Administration via Spoon Medication Administration Whole meds with puree Compensations Slow rate;Small sips/bites Postural Changes Remain semi-upright after after feeds/meals (Comment);Seated upright at 90 degrees   CHL IP OTHER RECOMMENDATIONS 03/13/2020 Recommended Consults -- Oral Care Recommendations Oral care BID Other Recommendations Clarify dietary restrictions   CHL IP FOLLOW UP RECOMMENDATIONS 03/13/2020  Follow up Recommendations Skilled Nursing facility   [facility] IP FREQUENCY AND DURATION 03/13/2020 Speech Therapy Frequency (ACUTE ONLY) min 2x/week Treatment Duration 1 week      CHL IP ORAL PHASE 03/13/2020 Oral Phase WFL Oral - Pudding Teaspoon -- Oral - Pudding Cup -- Oral - Honey Teaspoon -- Oral - Honey Cup -- Oral - Nectar Teaspoon -- Oral - Nectar Cup -- Oral - Nectar Straw -- Oral - Thin Teaspoon -- Oral - Thin Cup -- Oral - Thin Straw -- Oral - Puree -- Oral - Mech Soft -- Oral - Regular -- Oral - Multi-Consistency -- Oral - Pill -- Oral Phase - Comment --  CHL IP PHARYNGEAL PHASE 03/13/2020 Pharyngeal Phase -- Pharyngeal- Pudding Teaspoon -- Pharyngeal -- Pharyngeal- Pudding Cup -- Pharyngeal -- Pharyngeal- Honey Teaspoon -- Pharyngeal -- Pharyngeal- Honey Cup -- Pharyngeal Material enters airway, passes BELOW cords without attempt by patient to eject out (silent aspiration) Pharyngeal- Nectar Teaspoon -- Pharyngeal -- Pharyngeal- Nectar Cup -- Pharyngeal Material enters airway, passes BELOW cords without attempt by patient to eject out (silent aspiration) Pharyngeal- Nectar Straw -- Pharyngeal -- Pharyngeal- Thin Teaspoon -- Pharyngeal Material enters airway, passes BELOW cords without attempt by patient to eject out (silent aspiration) Pharyngeal- Thin Cup -- Pharyngeal Material enters airway, passes BELOW cords without attempt by patient to eject out (silent aspiration) Pharyngeal- Thin Straw -- Pharyngeal Material enters airway, passes BELOW cords without attempt by patient to eject out (silent aspiration) Pharyngeal- Puree -- Pharyngeal -- Pharyngeal- Mechanical Soft -- Pharyngeal -- Pharyngeal- Regular -- Pharyngeal -- Pharyngeal- Multi-consistency -- Pharyngeal -- Pharyngeal- Pill -- Pharyngeal -- Pharyngeal Comment --  No flowsheet data found. Amelia H. Romie Levee, CCC-SLP Speech Language Pathologist Georgetta Haber 03/13/2020, 12:16 PM                CBC Recent Labs  Lab 03/11/20 1457  03/12/20 0457  WBC 5.8 7.1  HGB 9.4* 9.4*  HCT 28.7* 30.1*  PLT 130* 276  MCV 99.3 99.7  MCH 32.5 31.1  MCHC 32.8 31.2  RDW 20.5* 20.0*  LYMPHSABS 1.2  --  MONOABS 0.3  --   EOSABS 0.1  --   BASOSABS 0.0  --     Chemistries  Recent Labs  Lab 03/11/20 1457 03/12/20 0457  NA 137 138  K 4.7 4.0  CL 101 104  CO2 25 24  GLUCOSE 102* 83  BUN 21 19  CREATININE 0.89 0.83  CALCIUM 8.2* 7.8*  AST 20  --   ALT 11  --   ALKPHOS 121  --   BILITOT 0.8  --    ------------------------------------------------------------------------------------------------------------------ No results for input(s): CHOL, HDL, LDLCALC, TRIG, CHOLHDL, LDLDIRECT in the last 72 hours.  Lab Results  Component Value Date   HGBA1C 6.5 (H) 03/12/2020   ------------------------------------------------------------------------------------------------------------------ No results for input(s): TSH, T4TOTAL, T3FREE, THYROIDAB in the last 72 hours.  Invalid input(s): FREET3 ------------------------------------------------------------------------------------------------------------------ No results for input(s): VITAMINB12, FOLATE, FERRITIN, TIBC, IRON, RETICCTPCT in the last 72 hours.  Coagulation profile Recent Labs  Lab 03/11/20 1631  INR 1.7*    No results for input(s): DDIMER in the last 72 hours.  Cardiac Enzymes No results for input(s): CKMB, TROPONINI, MYOGLOBIN in the last 168 hours.  Invalid input(s): CK ------------------------------------------------------------------------------------------------------------------    Component Value Date/Time   BNP 241.0 (H) 04/20/2018 1219     Kathie Dike M.D on 03/14/2020 at 5:00 PM  Go to [email or web address] - for contact info  [facility] - Office  [phone]

## 2020-03-14 NOTE — Plan of Care (Signed)

## 2020-03-15 ENCOUNTER — Encounter (HOSPITAL_COMMUNITY): Payer: Self-pay | Admitting: Internal Medicine

## 2020-03-15 ENCOUNTER — Other Ambulatory Visit: Payer: Self-pay

## 2020-03-15 LAB — GLUCOSE, CAPILLARY
Glucose-Capillary: 107 mg/dL — ABNORMAL HIGH (ref 70–99)
Glucose-Capillary: 129 mg/dL — ABNORMAL HIGH (ref 70–99)
Glucose-Capillary: 132 mg/dL — ABNORMAL HIGH (ref 70–99)
Glucose-Capillary: 224 mg/dL — ABNORMAL HIGH (ref 70–99)
Glucose-Capillary: 79 mg/dL (ref 70–99)
Glucose-Capillary: 97 mg/dL (ref 70–99)

## 2020-03-15 LAB — BASIC METABOLIC PANEL
Anion gap: 9 (ref 5–15)
BUN: 15 mg/dL (ref 8–23)
CO2: 25 mmol/L (ref 22–32)
Calcium: 7.6 mg/dL — ABNORMAL LOW (ref 8.9–10.3)
Chloride: 104 mmol/L (ref 98–111)
Creatinine, Ser: 0.76 mg/dL (ref 0.44–1.00)
GFR calc Af Amer: >60 mL/min (ref >60)
GFR calc non Af Amer: >60 mL/min (ref >60)
Glucose, Bld: 98 mg/dL (ref 70–99)
Potassium: 2.9 mmol/L — ABNORMAL LOW (ref 3.5–5.1)
Sodium: 138 mmol/L (ref 135–145)

## 2020-03-15 LAB — MAGNESIUM: Magnesium: 1.1 mg/dL — ABNORMAL LOW (ref 1.7–2.4)

## 2020-03-15 NOTE — Progress Notes (Signed)
Patient Demographics:    Holly Sparks, is a 79 y.o. female, DOB - 1941-03-10, FUX:[id number]  Admit date - 03/11/2020   Admitting Physician Starleen Arms, MD  Outpatient Primary MD for the patient is Gareth Morgan, MD  LOS - 4   Chief Complaint  Patient presents with  . Fall        Subjective:    Holly Sparks continues to require supplemental oxygen.  She continues to have cough.  Overall, p.o. intake may be improving.  Continues to cough after eating.   Assessment  & Plan :    Active Problems:   Diabetes (HCC)   Essential hypertension, benign   Expressive aphasia   Left middle cerebral artery stroke (HCC)   Chronic anticoagulation   Type 2 diabetes mellitus with circulatory disorder (HCC)   Atrial fibrillation with RVR (HCC)   Sepsis (HCC)   Brief Summary 79 y.o. female, withhistory significant for diabetes, GERD, hypertension and history of atrial fibrillation on Eliquis, history of CVA with expressive aphasia and dementia, hypertension, hyperlipidemia -Admitted from [facility] after falling out of her wheelchair while on Eliquis with concerns for lethargy and metabolic encephalopathy and sepsis secondary to pneumonia   A/p 1) sepsis secondary to pneumonia--- -received Rocephin and vancomycin in the ED,  currently on IV Zosyn -Plan is to discharge on oral Augmentin -Dyspnea and cough is not worse  2)acute metabolic encephalopathy--- secondary to #1 above,  superimposed on underlying dementia--treat sepsis as above #1  3) status post fall from wheelchair prior to admission while on Eliquis--- multiple bruises/abrasions to bilateral upper and lower extremities and face greater on the right side--- POA -Wound care specialist consult appreciated -cleanse wounds  with soap and water, rinse and pa t day, followed by application of a thin layer of a humectant, Eucerin. Open  lesions will be covered with the moisture retentive, nonadherent, antimicrobial, xeroform and topped with a dry dressing. A Kerlix roll gauze wrapped from just below toes to just below knees will secure dressings and protect the LEs from inadvertent trauma. Bilateral feet will be placed into pressure redistribution heel boots. A pressure redistribution chair pad is provided for her for when OOB in the chair at her facility post discharge.  A sacral prophylactic foam dressing to the sacrum is to be placed while in house.  4) A. fib with RVR--- history of TIA/CVA-she is anticoagulated with Eliquis -c/n  Cardizem CD and p.o. metoprolol for rate control.  Her son reports that she has fallen 10 times since being at assisted living facility.  Will need to have further conversations regarding risks/benefits of continuing anticoagulation.  5)DM2-A1c 6.5, reflecting excellent DM control PTA -Continue to hold Metformin, Use Novolog/Humalog Sliding scale insulin with Accu-Cheks/Fingersticks as ordered  6)Dysphagia--- Speech eval appreciated, patient appears to be having penetration with both thick and thin liquids.  She would be considered high risk for aspiration. - Dysphagia 3 (Mech soft) solids;Thin liquid  7)Social/Ethics-- GOC----dementia--- and poor oral intake swallowing  8)Social/Ethics--palliative care consult appreciated,  --Extensive discussion with husband by palliative care service --Unfortunately, it appears that husband may not have completely grasped conversation and was not able to relay the details of the conversation to me today -I had another goals of  care conversation with the patient's son, [name] who is power of attorney -After discussing with family, patient reports that family wishes the patient return to [facility] if possible.  They are open to hospice if indicated.  Will need palliative care to readdress this with POA in a.m. -DNR/DNI  Disposition/Need for in-Hospital Stay-  patient unable to be discharged at this time due to need for determination of appropriate disposition.  Family wishes for patient to return to ALF with possible hospice.  This will need to be confirmed ALF. -Patient From: [facility] D/C Place: ALF versus SNF--- possibly with de-escalation of care/palliative care involvement and focus on comfort Barriers: Not Clinically Stable-continued conversations with change in goals of care and possible hospice - Code Status : DNR  Family Communication:  -discussed with  son [name] who is POA possibly with de-escalation of care/palliative care involvement and focus on comfort)  Consults  : Speech pathologist/palliative care  DVT Prophylaxis  : Eliquis  Lab Results  Component Value Date   PLT 276 03/12/2020    Inpatient Medications  Scheduled Meds: . apixaban  5 mg Oral BID  . Chlorhexidine Gluconate Cloth  6 each Topical Q0600  . diltiazem  120 mg Oral Daily  . hydrocerin   Topical Daily  . insulin aspart  0-6 Units Subcutaneous Q4H  . metoprolol tartrate  12.5 mg Oral BID  . mupirocin ointment  1 application Nasal BID   Continuous Infusions: . sodium chloride 20 mL/hr at 03/15/20 0456  . piperacillin-tazobactam (ZOSYN)  IV 3.375 g (03/15/20 1341)   PRN Meds:.acetaminophen **OR** acetaminophen, albuterol    Anti-infectives (From admission, onward)   Start     Dose/Rate Route Frequency Ordered Stop   03/11/20 1630  piperacillin-tazobactam (ZOSYN) IVPB 3.375 g     3.375 g 12.5 mL/hr over 240 Minutes Intravenous Every 8 hours 03/11/20 1622     03/11/20 1615  metroNIDAZOLE (FLAGYL) IVPB 500 mg  Status:  Discontinued     500 mg 100 mL/hr over 60 Minutes Intravenous Every 8 hours 03/11/20 1614 03/11/20 1622   03/11/20 1500  vancomycin (VANCOCIN) IVPB 1000 mg/200 mL premix     1,000 mg 200 mL/hr over 60 Minutes Intravenous  Once 03/11/20 1451 03/11/20 1650   03/11/20 1430  cefTRIAXone (ROCEPHIN) 1 g in sodium chloride 0.9 % 100 mL IVPB      1 g 200 mL/hr over 30 Minutes Intravenous  Once 03/11/20 1419 03/11/20 1539        Objective:   Vitals:   03/15/20 0700 03/15/20 0742 03/15/20 1100 03/15/20 1433  BP:    138/81  Pulse: 99  97 89  Resp:    18  Temp:      TempSrc:      SpO2:  97%  98%  Weight:      Height:        Wt Readings from Last 3 Encounters:  03/11/20 57.2 kg  11/28/19 58.1 kg  11/18/19 58.1 kg     Intake/Output Summary (Last 24 hours) at 03/15/2020 1851 Last data filed at 03/15/2020 1700 Gross per 24 hour  Intake 730 ml  Output --  Net 730 ml     Physical Exam  General exam: Alert, awake, oriented x 3 Respiratory system: Bilateral rhonchi.  Increased respiratory effort Cardiovascular system: Irregular. No murmurs, rubs, gallops. Gastrointestinal system: Abdomen is nondistended, soft and nontender. No organomegaly or masses felt. Normal bowel sounds heard. Central nervous system: Alert and oriented. No focal neurological deficits.  Extremities: No C/C/E, +pedal pulses Skin: No rashes, lesions or ulcers Psychiatry: Calm, pleasant.     Data Review:   Micro Results Recent Results (from the past 240 hour(s))  Urine culture     Status: Abnormal   Collection Time: 03/11/20  1:49 PM   Specimen: Urine, Catheterized  Result Value Ref Range Status   Specimen Description   Final    URINE, CATHETERIZED Performed at [facility], [address] [place], [place] [address]    Special Requests   Final    NONE Performed at [facility], [address], [place], [place] [address]    Culture >=100,000 COLONIES/mL KLEBSIELLA PNEUMONIAE (A)  Final   Report Status 03/13/2020 FINAL  Final   Organism ID, Bacteria KLEBSIELLA PNEUMONIAE (A)  Final      Susceptibility   Klebsiella pneumoniae - MIC*    AMPICILLIN RESISTANT Resistant     CEFAZOLIN <=4 SENSITIVE Sensitive     CEFTRIAXONE <=1 SENSITIVE Sensitive     CIPROFLOXACIN <=0.25 SENSITIVE Sensitive     GENTAMICIN <=1 SENSITIVE Sensitive      IMIPENEM 1 SENSITIVE Sensitive     NITROFURANTOIN 64 INTERMEDIATE Intermediate     TRIMETH/SULFA >=320 RESISTANT Resistant     AMPICILLIN/SULBACTAM <=2 SENSITIVE Sensitive     PIP/TAZO <=4 SENSITIVE Sensitive     * >=100,000 COLONIES/mL KLEBSIELLA PNEUMONIAE  Blood Culture (routine x 2)     Status: None (Preliminary result)   Collection Time: 03/11/20  2:57 PM   Specimen: Right Antecubital; Blood  Result Value Ref Range Status   Specimen Description RIGHT ANTECUBITAL  Final   Special Requests   Final    BOTTLES DRAWN AEROBIC ONLY Blood Culture results may not be optimal due to an inadequate volume of blood received in culture bottles   Culture   Final    NO GROWTH 4 DAYS Performed at [facility], [address] [place], [place] [address]    Report Status PENDING  Incomplete  Blood Culture (routine x 2)     Status: None (Preliminary result)   Collection Time: 03/11/20  2:57 PM   Specimen: BLOOD RIGHT HAND  Result Value Ref Range Status   Specimen Description BLOOD RIGHT HAND  Final   Special Requests   Final    BOTTLES DRAWN AEROBIC AND ANAEROBIC Blood Culture results may not be optimal due to an inadequate volume of blood received in culture bottles   Culture   Final    NO GROWTH 4 DAYS Performed at [facility], [address] [place], [place] [address]    Report Status PENDING  Incomplete  Respiratory Panel by RT PCR (Flu A&B, Covid) - Nasopharyngeal Swab     Status: None   Collection Time: 03/11/20  4:30 PM   Specimen: Nasopharyngeal Swab  Result Value Ref Range Status   SARS Coronavirus 2 by RT PCR NEGATIVE NEGATIVE Final    Comment: (NOTE) SARS-CoV-2 target nucleic acids are NOT DETECTED. The SARS-CoV-2 RNA is generally detectable in upper respiratoy specimens during the acute phase of infection. The lowest concentration of SARS-CoV-2 viral copies this assay can detect is 131 copies/mL. A negative result does not preclude SARS-Cov-2 infection and should not be used as  the sole basis for treatment or other patient management decisions. A negative result may occur with  improper specimen collection/handling, submission of specimen other than nasopharyngeal swab, presence of viral mutation(s) within the areas targeted by this assay, and inadequate number of viral copies (<131 copies/mL). A  negative result must be combined with clinical observations, patient history, and epidemiological information. The expected result is Negative. Fact Sheet for Patients:  PinkCheek.be Fact Sheet for Healthcare Providers:  GravelBags.it This test is not yet ap proved or cleared by the [place] FDA and  has been authorized for detection and/or diagnosis of SARS-CoV-2 by FDA under an Emergency Use Authorization (EUA). This EUA will remain  in effect (meaning this test can be used) for the duration of the COVID-19 declaration under Section 564(b)(1) of the Act, 21 U.S.C. section 360bbb-3(b)(1), unless the authorization is terminated or revoked sooner.    Influenza A by PCR NEGATIVE NEGATIVE Final   Influenza B by PCR NEGATIVE NEGATIVE Final    Comment: (NOTE) The Xpert Xpress SARS-CoV-2/FLU/RSV assay is intended as an aid in  the diagnosis of influenza from Nasopharyngeal swab specimens and  should not be used as a sole basis for treatment. Nasal washings and  aspirates are unacceptable for Xpert Xpress SARS-CoV-2/FLU/RSV  testing. Fact Sheet for Patients: PinkCheek.be Fact Sheet for Healthcare Providers: GravelBags.it This test is not yet approved or cleared by the [place] FDA and  has been authorized for detection and/or diagnosis of SARS-CoV-2 by  FDA under an Emergency Use Authorization (EUA). This EUA will remain  in effect (meaning this test can be used) for the duration of the  Covid-19 declaration under Section 564(b)(1) of the Act, 21    U.S.C. section 360bbb-3(b)(1), unless the authorization is  terminated or revoked. Performed at [facility], [address] [place], [place] [address]   MRSA PCR Screening     Status: Abnormal   Collection Time: 03/11/20  4:43 PM   Specimen: Nasal Mucosa; Nasopharyngeal  Result Value Ref Range Status   MRSA by PCR POSITIVE (A) NEGATIVE Final    Comment:        The GeneXpert MRSA Assay (FDA approved for NASAL specimens only), is one component of a comprehensive MRSA colonization surveillance program. It is not intended to diagnose MRSA infection nor to guide or monitor treatment for MRSA infections. RESULT CALLED TO, READ BACK BY AND VERIFIED WITH: KILMER,T ON 03/11/20 AT 2155 BY LOY,C Performed at [facility], [address], [place], [place] [address]     Radiology Reports CT Head Wo Contrast  Result Date: 03/11/2020 CLINICAL DATA:  Fall from wheelchair yesterday while on blood thinners, altered mental status EXAM: CT HEAD WITHOUT CONTRAST TECHNIQUE: Contiguous axial images were obtained from the base of the skull through the vertex without intravenous contrast. COMPARISON:  11/28/2019 FINDINGS: Brain: Chronic atrophic and ischemic changes are again seen and stable. No findings to suggest acute hemorrhage, acute infarction or space-occupying mass lesion are noted. Stable old infarcts in the left MCA distribution are again seen. Vascular: No hyperdense vessel or unexpected calcification. Skull: Normal. Negative for fracture or focal lesion. Sinuses/Orbits: Mild opacification of the posterior left ethmoid air cells is again noted. Other: None IMPRESSION: Chronic atrophic and ischemic changes without acute abnormality. Electronically Signed   By: Inez Catalina M.D.   On: 03/11/2020 15:51   DG Chest Port 1 View  Result Date: 03/11/2020 CLINICAL DATA:  79 year old female with fever and cough. Fall from wheelchair yesterday. Negative for COVID-19 last year, being retested today. EXAM:  PORTABLE CHEST 1 VIEW COMPARISON:  Portable chest 11/07/2019 and earlier. FINDINGS: Portable AP semi upright view at 1432 hours. New confluent abnormal opacities in the left lung about the hilum, and at the right lung base. Evidence of  superimposed right pleural effusion, including some fluid suspected in the right minor fissure. The left lung base and right upper lobe remain well pneumatized. Lower lung volumes overall. Stable mediastinal contours. Visualized tracheal air column is within normal limits. Prior cervical ACDF. No acute osseous abnormality identified. IMPRESSION: 1. Confluent new left perihilar and right lung base opacity most suspicious for multilobar pneumonia. Aspiration is also a consideration. 2. Superimposed small right pleural effusion suspected. Electronically Signed   By: Odessa FlemingH  Hall M.D.   On: 03/11/2020 15:14   DG Swallowing Func-Speech Pathology  Result Date: 03/13/2020 Objective Swallowing Evaluation: Type of Study: MBS-Modified Barium Swallow Study  Patient Details Name: Lance BoschCatherine P Pendley MRN: [id number] Date of Birth: [birth year] Today's Date: 03/13/2020 Time: SLP Start Time (ACUTE ONLY): 0901 -SLP Stop Time (ACUTE ONLY): 0922 SLP Time Calculation (min) (ACUTE ONLY): 21 min Past Medical History: Past Medical History: Diagnosis Date . Diabetes (HCC)   Type 2 x 10 yrs . Dysrhythmia   AFib . GERD (gastroesophageal reflux disease)  . Grade I diastolic dysfunction 09/03/2018 . High cholesterol  . Hypertension   x 10 yrs . Paroxysmal a-fib: Remote hx of afib 06/15/2015 . Stroke [facility](HCC)   no deficits Past Surgical History: Past Surgical History: Procedure Laterality Date . CATARACT EXTRACTION W/PHACO Left 06/06/2016  Procedure: CATARACT EXTRACTION PHACO AND INTRAOCULAR LENS PLACEMENT (IOC);  Surgeon: Gemma PayorKerry Hunt, MD;  Location: [facility] ORS;  Service: Ophthalmology;  Laterality: Left;  CDE: 8.32 . COLONOSCOPY   . COLONOSCOPY N/A 05/08/2013  Procedure: COLONOSCOPY;  Surgeon: Malissa HippoNajeeb U Rehman, MD;  Location: [facility]  ENDO SUITE;  Service: Endoscopy;  Laterality: N/A;  1015 . NECK SURGERY    x 2 for spur and disc problems HPI: Revonda HumphreyCatherine Frame  is a 79 y.o. female, withhistory significant for diabetes, GERD, hypertension and history of atrial fibrillation on Eliquis, history of CVA with expressive aphasia and dementia, hypertension, hyperlipidemia, patient is a resident at SNF, she presents from SNF to evaluation for increased lethargy and generalized weakness, and fall yesterday, apparently patient fell from her wheelchair yesterday, where she had multiple bruising, she is on Eliquis, as well today she was noted to be more sleepy and less interactive today, she was sent for further evaluation, especially she struck her forehead when she fell from the chair, patient is poor historian, she herself cannot provide any complaints. In the ED she was noted to have multiple bruising from her fall, and forehead and right periorbital area, CT head with no evidence of bleed or any acute findings, she was noted to be febrile 100.8, as well patient was noted to have cough, chest x-ray was significant for right lower base opacity, and she had elevated lactic acid at 2.2, her urinalysis was negative, patient received broad-spectrum antibiotic in ED, blood cultures were done and [facility] was called to admit. BSE 4/15 indicated need for MBS  Subjective: "His nanme is Reita ClicheBobby." (husband) Assessment / Plan / Recommendation CHL IP CLINICAL IMPRESSIONS 03/13/2020 Clinical Impression Pt presents with moderate sensorimotor pharyngeal dysphagia characterized by penetration of most all presentations of liquids (thin, NTL and HTL)  during the swallow and eventual silent aspiration of (trace to moderate amounts) residuals after the swallow. Pt demonstrates good hyolaryngeal elevation however note inadequate laryngeal vestibule closure and decreased pharyngeal squeeze during the swallow consequently resulting in residuals visualized in valleculae, laryngeal  vestibule and interarytenoid space which are eventually aspirated at varying levels of severity. Chin tuck and effortful swallow were attempted and both ineffective in  decreasing residue, or in limiting or eliminating penetration/aspiration.  Note larger amounts of penetration/aspiration with thicker consistencies despite providing varying volumes. Penetration but no aspiration was noted with puree and solid textures; moderate residue of solids in the valleculae was cleared with a reflexive additional swallow. The smallest amount of laryngeal vestibule residue (which is eventually aspirated) was noted with tsp sips of thin liquids. Recommend D3/mech soft and upgrade to thin liquids with administration by TSP. Recommend meds to be administered whole with puree. Recommend ST to continue to follow and to f/u at d/c location to implement dysphagia therapy with laryngeal strengthening exercises. Communicated with MD who will also clarify goals of care. SLP Visit Diagnosis Dysphagia, pharyngeal phase (R13.13) Attention and concentration deficit following -- Frontal lobe and executive function deficit following -- Impact on safety and function Moderate aspiration risk   CHL IP TREATMENT RECOMMENDATION 03/13/2020 Treatment Recommendations Therapy as outlined in treatment plan below   Prognosis 03/13/2020 Prognosis for Safe Diet Advancement Fair Barriers to Reach Goals Severity of deficits Barriers/Prognosis Comment -- CHL IP DIET RECOMMENDATION 03/13/2020 SLP Diet Recommendations Dysphagia 3 (Mech soft) solids;Thin liquid Liquid Administration via Spoon Medication Administration Whole meds with puree Compensations Slow rate;Small sips/bites Postural Changes Remain semi-upright after after feeds/meals (Comment);Seated upright at 90 degrees   CHL IP OTHER RECOMMENDATIONS 03/13/2020 Recommended Consults -- Oral Care Recommendations Oral care BID Other Recommendations Clarify dietary restrictions   CHL IP FOLLOW UP RECOMMENDATIONS  03/13/2020 Follow up Recommendations Skilled Nursing facility   [facility] IP FREQUENCY AND DURATION 03/13/2020 Speech Therapy Frequency (ACUTE ONLY) min 2x/week Treatment Duration 1 week      CHL IP ORAL PHASE 03/13/2020 Oral Phase WFL Oral - Pudding Teaspoon -- Oral - Pudding Cup -- Oral - Honey Teaspoon -- Oral - Honey Cup -- Oral - Nectar Teaspoon -- Oral - Nectar Cup -- Oral - Nectar Straw -- Oral - Thin Teaspoon -- Oral - Thin Cup -- Oral - Thin Straw -- Oral - Puree -- Oral - Mech Soft -- Oral - Regular -- Oral - Multi-Consistency -- Oral - Pill -- Oral Phase - Comment --  CHL IP PHARYNGEAL PHASE 03/13/2020 Pharyngeal Phase -- Pharyngeal- Pudding Teaspoon -- Pharyngeal -- Pharyngeal- Pudding Cup -- Pharyngeal -- Pharyngeal- Honey Teaspoon -- Pharyngeal -- Pharyngeal- Honey Cup -- Pharyngeal Material enters airway, passes BELOW cords without attempt by patient to eject out (silent aspiration) Pharyngeal- Nectar Teaspoon -- Pharyngeal -- Pharyngeal- Nectar Cup -- Pharyngeal Material enters airway, passes BELOW cords without attempt by patient to eject out (silent aspiration) Pharyngeal- Nectar Straw -- Pharyngeal -- Pharyngeal- Thin Teaspoon -- Pharyngeal Material enters airway, passes BELOW cords without attempt by patient to eject out (silent aspiration) Pharyngeal- Thin Cup -- Pharyngeal Material enters airway, passes BELOW cords without attempt by patient to eject out (silent aspiration) Pharyngeal- Thin Straw -- Pharyngeal Material enters airway, passes BELOW cords without attempt by patient to eject out (silent aspiration) Pharyngeal- Puree -- Pharyngeal -- Pharyngeal- Mechanical Soft -- Pharyngeal -- Pharyngeal- Regular -- Pharyngeal -- Pharyngeal- Multi-consistency -- Pharyngeal -- Pharyngeal- Pill -- Pharyngeal -- Pharyngeal Comment --  No flowsheet data found. Amelia H. Romie Levee, CCC-SLP Speech Language Pathologist Georgetta Haber 03/13/2020, 12:16 PM                CBC Recent Labs  Lab 03/11/20 1457  03/12/20 0457  WBC 5.8 7.1  HGB 9.4* 9.4*  HCT 28.7* 30.1*  PLT 130* 276  MCV 99.3 99.7  MCH 32.5 31.1  MCHC 32.8 31.2  RDW 20.5* 20.0*  LYMPHSABS 1.2  --   MONOABS 0.3  --   EOSABS 0.1  --   BASOSABS 0.0  --     Chemistries  Recent Labs  Lab 03/11/20 1457 03/12/20 0457 03/15/20 0608  NA 137 138 138  K 4.7 4.0 2.9*  CL 101 104 104  CO2 25 24 25   GLUCOSE 102* 83 98  BUN 21 19 15   CREATININE 0.89 0.83 0.76  CALCIUM 8.2* 7.8* 7.6*  MG  --   --  1.1*  AST 20  --   --   ALT 11  --   --   ALKPHOS 121  --   --   BILITOT 0.8  --   --    ------------------------------------------------------------------------------------------------------------------ No results for input(s): CHOL, HDL, LDLCALC, TRIG, CHOLHDL, LDLDIRECT in the last 72 hours.  Lab Results  Component Value Date   HGBA1C 6.5 (H) 03/12/2020   ------------------------------------------------------------------------------------------------------------------ No results for input(s): TSH, T4TOTAL, T3FREE, THYROIDAB in the last 72 hours.  Invalid input(s): FREET3 ------------------------------------------------------------------------------------------------------------------ No results for input(s): VITAMINB12, FOLATE, FERRITIN, TIBC, IRON, RETICCTPCT in the last 72 hours.  Coagulation profile Recent Labs  Lab 03/11/20 1631  INR 1.7*    No results for input(s): DDIMER in the last 72 hours.  Cardiac Enzymes No results for input(s): CKMB, TROPONINI, MYOGLOBIN in the last 168 hours.  Invalid input(s): CK ------------------------------------------------------------------------------------------------------------------    Component Value Date/Time   BNP 241.0 (H) 04/20/2018 1219     03/13/20 M.D on 03/15/2020 at 6:51 PM  Go to [email or web address] - for contact info  [facility] - Office  [phone]

## 2020-03-16 LAB — GLUCOSE, CAPILLARY
Glucose-Capillary: 118 mg/dL — ABNORMAL HIGH (ref 70–99)
Glucose-Capillary: 124 mg/dL — ABNORMAL HIGH (ref 70–99)
Glucose-Capillary: 130 mg/dL — ABNORMAL HIGH (ref 70–99)
Glucose-Capillary: 266 mg/dL — ABNORMAL HIGH (ref 70–99)
Glucose-Capillary: 90 mg/dL (ref 70–99)
Glucose-Capillary: 98 mg/dL (ref 70–99)

## 2020-03-16 LAB — CBC
HCT: 31.1 % — ABNORMAL LOW (ref 36.0–46.0)
Hemoglobin: 9.6 g/dL — ABNORMAL LOW (ref 12.0–15.0)
MCH: 30.7 pg (ref 26.0–34.0)
MCHC: 30.9 g/dL (ref 30.0–36.0)
MCV: 99.4 fL (ref 80.0–100.0)
Platelets: 290 10*3/uL (ref 150–400)
RBC: 3.13 MIL/uL — ABNORMAL LOW (ref 3.87–5.11)
RDW: 18.4 % — ABNORMAL HIGH (ref 11.5–15.5)
WBC: 5.6 10*3/uL (ref 4.0–10.5)
nRBC: 0 % (ref 0.0–0.2)

## 2020-03-16 LAB — CULTURE, BLOOD (ROUTINE X 2)
Culture: NO GROWTH
Culture: NO GROWTH

## 2020-03-16 NOTE — Progress Notes (Signed)
OT Cancellation Note  Patient Details Name: Holly Sparks MRN: [id number] DOB: 09-Jun-1941   Cancelled Treatment:    Reason Eval/Treat Not Completed: Other (comment). OT orders received. Patient's chart reviewed. Per chart, discharge plan is for patient to return to [facility] and receive Hospice care at this time. Husband/family are considering comfort measures. I recommend that staff OT evaluate patient once she returns to [facility] to assess and OT needs for patient. Thank you for the referral.   Limmie Patricia, OTR/L,CBIS  [phone]  03/16/2020, 8:40 AM

## 2020-03-16 NOTE — Progress Notes (Signed)
Patient Demographics:    Holly Sparks, is a 79 y.o. female, DOB - 27-Oct-1941, ZOX:[id number]  Admit date - 03/11/2020   Admitting Physician Holly Armsawood S Elgergawy, MD  Outpatient Primary MD for the patient is Holly MorganKnowlton, Steve, MD  LOS - 5   Chief Complaint  Patient presents with  . Fall        Subjective:    Holly Humphreyatherine Bransfield cough with oral intake -More awake -Hypoxia and oxygen requirement persist   Assessment  & Plan :    Active Problems:   Diabetes (HCC)   Essential hypertension, benign   Expressive aphasia   Left middle cerebral artery stroke (HCC)   Chronic anticoagulation   Type 2 diabetes mellitus with circulatory disorder (HCC)   Atrial fibrillation with RVR (HCC)   Sepsis (HCC)   Brief Summary 79 y.o. female, withhistory significant for diabetes, GERD, hypertension and history of atrial fibrillation on Eliquis, history of CVA with expressive aphasia and dementia, hypertension, hyperlipidemia -Admitted from [facility] after falling out of her wheelchair while on Eliquis with concerns for lethargy and metabolic encephalopathy and sepsis secondary to pneumonia --Possible discharge back to ALF with home health and palliative care services versus transition to comfort care -Awaiting family members to make further decisions with palliative care team -Anticipate discharge on 03/17/2020   A/p 1) sepsis secondary to pneumonia--- -received Rocephin and vancomycin in the ED,  currently on IV Zosyn -Plan is to discharge on oral Augmentin -Dyspnea and cough  and hypoxia has not resolved  2)acute metabolic encephalopathy--- secondary to #1 above,  superimposed on underlying dementia--treat sepsis as above #1  3) status post fall from wheelchair prior to admission while on Eliquis--- multiple bruises/abrasions to bilateral upper and lower extremities and face greater on the right side---  POA -Wound care specialist consult appreciated -cleanse wounds  with soap and water, rinse and pa t day, followed by application of a thin layer of a humectant, Eucerin. Open lesions will be covered with the moisture retentive, nonadherent, antimicrobial, xeroform and topped with a dry dressing. A Kerlix roll gauze wrapped from just below toes to just below knees will secure dressings and protect the LEs from inadvertent trauma. Bilateral feet will be placed into pressure redistribution heel boots. A pressure redistribution chair pad is provided for her for when OOB in the chair at her facility post discharge.  A sacral prophylactic foam dressing to the sacrum is to be placed while in house.  4) A. fib with RVR--- history of TIA/CVA-she is anticoagulated with Eliquis -c/n  Cardizem CD and p.o. metoprolol for rate control.  Her son reports that she has fallen 10 times since being at assisted living facility.   -Awaiting family decision with regards to continuing versus discontinuing Eliquis given recurrent falls   5)DM2-A1c 6.5, reflecting excellent DM control PTA -Continue to hold Metformin, Use Novolog/Humalog Sliding scale insulin with Accu-Cheks/Fingersticks as ordered  6)Dysphagia--- Speech eval appreciated, patient appears to be having penetration with both thick and thin liquids.  She would be considered high risk for aspiration. - Dysphagia 3 (Mech soft) solids;Thin liquid  7)Social/Ethics-- GOC----dementia--- and poor oral intake swallowing -palliative care consult appreciated,  --Extensive discussion with husband by palliative care service --Unfortunately, it appears that husband  may not have completely grasped conversation and was not able to relay the details of the conversation to me today -I had another goals of care conversation with the patient's son, Holly Sparks who is power of attorney -After discussing with family, patient reports that family wishes the patient return to [place] if  possible.  They are open to hospice if indicated.  Will need palliative care to readdress this with POA in a.m. -DNR/DNI  Disposition/Need for in-Hospital Stay- patient unable to be discharged at this time due to need for determination of appropriate disposition.    ---Possible discharge back to ALF with home health and palliative care services versus transition to comfort care -Awaiting family members to make further decisions with palliative care team -Anticipate discharge on 03/17/2020  -Patient From: [facility] D/C Place: ALF versus SNF--- possibly with de-escalation of care/palliative care involvement and focus on comfort Barriers: Not Clinically Stable-continued conversations with change in goals of care and possible hospice - Code Status : DNR  Family Communication:  -discussed with  son Holly Sparks who is POA possibly with de-escalation of care/palliative care involvement and focus on comfort)  Consults  : Speech pathologist/palliative care  DVT Prophylaxis  : Eliquis  Lab Results  Component Value Date   PLT 290 03/16/2020    Inpatient Medications  Scheduled Meds: . apixaban  5 mg Oral BID  . Chlorhexidine Gluconate Cloth  6 each Topical Q0600  . diltiazem  120 mg Oral Daily  . hydrocerin   Topical Daily  . insulin aspart  0-6 Units Subcutaneous Q4H  . metoprolol tartrate  12.5 mg Oral BID  . mupirocin ointment  1 application Nasal BID   Continuous Infusions: . sodium chloride 20 mL/hr at 03/15/20 0456  . piperacillin-tazobactam (ZOSYN)  IV 3.375 g (03/16/20 1313)   PRN Meds:.acetaminophen **OR** acetaminophen, albuterol    Anti-infectives (From admission, onward)   Start     Dose/Rate Route Frequency Ordered Stop   03/11/20 1630  piperacillin-tazobactam (ZOSYN) IVPB 3.375 g     3.375 g 12.5 mL/hr over 240 Minutes Intravenous Every 8 hours 03/11/20 1622     03/11/20 1615  metroNIDAZOLE (FLAGYL) IVPB 500 mg  Status:  Discontinued     500 mg 100 mL/hr over 60  Minutes Intravenous Every 8 hours 03/11/20 1614 03/11/20 1622   03/11/20 1500  vancomycin (VANCOCIN) IVPB 1000 mg/200 mL premix     1,000 mg 200 mL/hr over 60 Minutes Intravenous  Once 03/11/20 1451 03/11/20 1650   03/11/20 1430  cefTRIAXone (ROCEPHIN) 1 g in sodium chloride 0.9 % 100 mL IVPB     1 g 200 mL/hr over 30 Minutes Intravenous  Once 03/11/20 1419 03/11/20 1539        Objective:   Vitals:   03/15/20 2218 03/16/20 0512 03/16/20 0744 03/16/20 1442  BP:  (!) 152/89 (!) 139/98 133/76  Pulse: 100 99 100 90  Resp:  18 19 18   Temp:  97.9 F (36.6 C) 98.1 F (36.7 C) 98.2 F (36.8 C)  TempSrc:  Oral Oral   SpO2:  99% 98% 99%  Weight:      Height:        Wt Readings from Last 3 Encounters:  03/11/20 57.2 kg  11/28/19 58.1 kg  11/18/19 58.1 kg     Intake/Output Summary (Last 24 hours) at 03/16/2020 2047 Last data filed at 03/16/2020 1200 Gross per 24 hour  Intake 240 ml  Output --  Net 240 ml  Physical Exam  General exam: Alert, awake, oriented x 3 Nose- Prosper 2L/min Respiratory system: Few scattered rhonchi, air movement is fair  cardiovascular system: Irregular. No murmurs, rubs, gallops. Gastrointestinal system: Abdomen is nondistended, soft and nontender. No organomegaly or masses felt. Normal bowel sounds heard. Central nervous system: Alert and oriented. No focal neurological deficits. Extremities: No C/C/E, +pedal pulses Skin: No rashes, lesions or ulcers Psychiatry: Calm, pleasant.     Data Review:   Micro Results Recent Results (from the past 240 hour(s))  Urine culture     Status: Abnormal   Collection Time: 03/11/20  1:49 PM   Specimen: Urine, Catheterized  Result Value Ref Range Status   Specimen Description   Final    URINE, CATHETERIZED Performed at [facility], [address], [place], [place] [address]    Special Requests   Final    NONE Performed at [facility], [address] [place], [place] [address]    Culture >=100,000  COLONIES/mL KLEBSIELLA PNEUMONIAE (A)  Final   Report Status 03/13/2020 FINAL  Final   Organism ID, Bacteria KLEBSIELLA PNEUMONIAE (A)  Final      Susceptibility   Klebsiella pneumoniae - MIC*    AMPICILLIN RESISTANT Resistant     CEFAZOLIN <=4 SENSITIVE Sensitive     CEFTRIAXONE <=1 SENSITIVE Sensitive     CIPROFLOXACIN <=0.25 SENSITIVE Sensitive     GENTAMICIN <=1 SENSITIVE Sensitive     IMIPENEM 1 SENSITIVE Sensitive     NITROFURANTOIN 64 INTERMEDIATE Intermediate     TRIMETH/SULFA >=320 RESISTANT Resistant     AMPICILLIN/SULBACTAM <=2 SENSITIVE Sensitive     PIP/TAZO <=4 SENSITIVE Sensitive     * >=100,000 COLONIES/mL KLEBSIELLA PNEUMONIAE  Blood Culture (routine x 2)     Status: None   Collection Time: 03/11/20  2:57 PM   Specimen: Right Antecubital; Blood  Result Value Ref Range Status   Specimen Description RIGHT ANTECUBITAL  Final   Special Requests   Final    BOTTLES DRAWN AEROBIC ONLY Blood Culture results may not be optimal due to an inadequate volume of blood received in culture bottles   Culture   Final    NO GROWTH 5 DAYS Performed at [facility], [address], [place], [place] [address]    Report Status 03/16/2020 FINAL  Final  Blood Culture (routine x 2)     Status: None   Collection Time: 03/11/20  2:57 PM   Specimen: BLOOD RIGHT HAND  Result Value Ref Range Status   Specimen Description BLOOD RIGHT HAND  Final   Special Requests   Final    BOTTLES DRAWN AEROBIC AND ANAEROBIC Blood Culture results may not be optimal due to an inadequate volume of blood received in culture bottles   Culture   Final    NO GROWTH 5 DAYS Performed at [facility], [address], [place], [place] [address]    Report Status 03/16/2020 FINAL  Final  Respiratory Panel by RT PCR (Flu A&B, Covid) - Nasopharyngeal Swab     Status: None   Collection Time: 03/11/20  4:30 PM   Specimen: Nasopharyngeal Swab  Result Value Ref Range Status   SARS Coronavirus 2 by RT PCR NEGATIVE NEGATIVE  Final    Comment: (NOTE) SARS-CoV-2 target nucleic acids are NOT DETECTED. The SARS-CoV-2 RNA is generally detectable in upper respiratoy specimens during the acute phase of infection. The lowest concentration of SARS-CoV-2 viral copies this assay can detect is 131 copies/mL. A negative result does not preclude  SARS-Cov-2 infection and should not be used as the sole basis for treatment or other patient management decisions. A negative result may occur with  improper specimen collection/handling, submission of specimen other than nasopharyngeal swab, presence of viral mutation(s) within the areas targeted by this assay, and inadequate number of viral copies (<131 copies/mL). A negative result must be combined with clinical observations, patient history, and epidemiological information. The expected result is Negative. Fact Sheet for Patients:  https://www.[place].com/ Fact Sheet for Healthcare Providers:  https://www.young.biz/ This test is not yet ap proved or cleared by the [place] FDA and  has been authorized for detection and/or diagnosis of SARS-CoV-2 by FDA under an Emergency Use Authorization (EUA). This EUA will remain  in effect (meaning this test can be used) for the duration of the COVID-19 declaration under Section 564(b)(1) of the Act, 21 U.S.C. section 360bbb-3(b)(1), unless the authorization is terminated or revoked sooner.    Influenza A by PCR NEGATIVE NEGATIVE Final   Influenza B by PCR NEGATIVE NEGATIVE Final    Comment: (NOTE) The Xpert Xpress SARS-CoV-2/FLU/RSV assay is intended as an aid in  the diagnosis of influenza from Nasopharyngeal swab specimens and  should not be used as a sole basis for treatment. Nasal washings and  aspirates are unacceptable for Xpert Xpress SARS-CoV-2/FLU/RSV  testing. Fact Sheet for Patients: https://www.[place].com/ Fact Sheet for Healthcare  Providers: https://www.young.biz/ This test is not yet approved or cleared by the [place] FDA and  has been authorized for detection and/or diagnosis of SARS-CoV-2 by  FDA under an Emergency Use Authorization (EUA). This EUA will remain  in effect (meaning this test can be used) for the duration of the  Covid-19 declaration under Section 564(b)(1) of the Act, 21  U.S.C. section 360bbb-3(b)(1), unless the authorization is  terminated or revoked. Performed at [facility], [address], [place], [place] [address]   MRSA PCR Screening     Status: Abnormal   Collection Time: 03/11/20  4:43 PM   Specimen: Nasal Mucosa; Nasopharyngeal  Result Value Ref Range Status   MRSA by PCR POSITIVE (A) NEGATIVE Final    Comment:        The GeneXpert MRSA Assay (FDA approved for NASAL specimens only), is one component of a comprehensive MRSA colonization surveillance program. It is not intended to diagnose MRSA infection nor to guide or monitor treatment for MRSA infections. RESULT CALLED TO, READ BACK BY AND VERIFIED WITH: KILMER,T ON 03/11/20 AT 2155 BY LOY,C Performed at [facility], [address], [place], [place] [address]     Radiology Reports CT Head Wo Contrast  Result Date: 03/11/2020 CLINICAL DATA:  Fall from wheelchair yesterday while on blood thinners, altered mental status EXAM: CT HEAD WITHOUT CONTRAST TECHNIQUE: Contiguous axial images were obtained from the base of the skull through the vertex without intravenous contrast. COMPARISON:  11/28/2019 FINDINGS: Brain: Chronic atrophic and ischemic changes are again seen and stable. No findings to suggest acute hemorrhage, acute infarction or space-occupying mass lesion are noted. Stable old infarcts in the left MCA distribution are again seen. Vascular: No hyperdense vessel or unexpected calcification. Skull: Normal. Negative for fracture or focal lesion. Sinuses/Orbits: Mild opacification of the posterior left  ethmoid air cells is again noted. Other: None IMPRESSION: Chronic atrophic and ischemic changes without acute abnormality. Electronically Signed   By: Alcide Clever M.D.   On: 03/11/2020 15:51   DG Chest Port 1 View  Result Date: 03/11/2020 CLINICAL DATA:  79 year old female with fever  and cough. Fall from wheelchair yesterday. Negative for COVID-19 last year, being retested today. EXAM: PORTABLE CHEST 1 VIEW COMPARISON:  Portable chest 11/07/2019 and earlier. FINDINGS: Portable AP semi upright view at 1432 hours. New confluent abnormal opacities in the left lung about the hilum, and at the right lung base. Evidence of superimposed right pleural effusion, including some fluid suspected in the right minor fissure. The left lung base and right upper lobe remain well pneumatized. Lower lung volumes overall. Stable mediastinal contours. Visualized tracheal air column is within normal limits. Prior cervical ACDF. No acute osseous abnormality identified. IMPRESSION: 1. Confluent new left perihilar and right lung base opacity most suspicious for multilobar pneumonia. Aspiration is also a consideration. 2. Superimposed small right pleural effusion suspected. Electronically Signed   By: Odessa Fleming M.D.   On: 03/11/2020 15:14   DG Swallowing Func-Speech Pathology  Result Date: 03/13/2020 Objective Swallowing Evaluation: Type of Study: MBS-Modified Barium Swallow Study  Patient Details Name: CASEE KNEPP MRN: [id number] Date of Birth: 1941/07/14 Today's Date: 03/13/2020 Time: SLP Start Time (ACUTE ONLY): 0901 -SLP Stop Time (ACUTE ONLY): 0922 SLP Time Calculation (min) (ACUTE ONLY): 21 min Past Medical History: Past Medical History: Diagnosis Date . Diabetes (HCC)   Type 2 x 10 yrs . Dysrhythmia   AFib . GERD (gastroesophageal reflux disease)  . Grade I diastolic dysfunction 09/03/2018 . High cholesterol  . Hypertension   x 10 yrs . Paroxysmal a-fib: Remote hx of afib 06/15/2015 . Stroke [facility])   no deficits Past Surgical  History: Past Surgical History: Procedure Laterality Date . CATARACT EXTRACTION W/PHACO Left 06/06/2016  Procedure: CATARACT EXTRACTION PHACO AND INTRAOCULAR LENS PLACEMENT (IOC);  Surgeon: Gemma Payor, MD;  Location: [facility] ORS;  Service: Ophthalmology;  Laterality: Left;  CDE: 8.32 . COLONOSCOPY   . COLONOSCOPY N/A 05/08/2013  Procedure: COLONOSCOPY;  Surgeon: Malissa Hippo, MD;  Location: [facility] ENDO SUITE;  Service: Endoscopy;  Laterality: N/A;  1015 . NECK SURGERY    x 2 for spur and disc problems HPI: Hannalee Castor  is a 79 y.o. female, withhistory significant for diabetes, GERD, hypertension and history of atrial fibrillation on Eliquis, history of CVA with expressive aphasia and dementia, hypertension, hyperlipidemia, patient is a resident at SNF, she presents from SNF to evaluation for increased lethargy and generalized weakness, and fall yesterday, apparently patient fell from her wheelchair yesterday, where she had multiple bruising, she is on Eliquis, as well today she was noted to be more sleepy and less interactive today, she was sent for further evaluation, especially she struck her forehead when she fell from the chair, patient is poor historian, she herself cannot provide any complaints. In the ED she was noted to have multiple bruising from her fall, and forehead and right periorbital area, CT head with no evidence of bleed or any acute findings, she was noted to be febrile 100.8, as well patient was noted to have cough, chest x-ray was significant for right lower base opacity, and she had elevated lactic acid at 2.2, her urinalysis was negative, patient received broad-spectrum antibiotic in ED, blood cultures were done and [facility] was called to admit. BSE 4/15 indicated need for MBS  Subjective: "His nanme is Reita Cliche." (husband) Assessment / Plan / Recommendation CHL IP CLINICAL IMPRESSIONS 03/13/2020 Clinical Impression Pt presents with moderate sensorimotor pharyngeal dysphagia characterized by  penetration of most all presentations of liquids (thin, NTL and HTL)  during the swallow and eventual silent aspiration of (trace to moderate amounts)  residuals after the swallow. Pt demonstrates good hyolaryngeal elevation however note inadequate laryngeal vestibule closure and decreased pharyngeal squeeze during the swallow consequently resulting in residuals visualized in valleculae, laryngeal vestibule and interarytenoid space which are eventually aspirated at varying levels of severity. Chin tuck and effortful swallow were attempted and both ineffective in decreasing residue, or in limiting or eliminating penetration/aspiration.  Note larger amounts of penetration/aspiration with thicker consistencies despite providing varying volumes. Penetration but no aspiration was noted with puree and solid textures; moderate residue of solids in the valleculae was cleared with a reflexive additional swallow. The smallest amount of laryngeal vestibule residue (which is eventually aspirated) was noted with tsp sips of thin liquids. Recommend D3/mech soft and upgrade to thin liquids with administration by TSP. Recommend meds to be administered whole with puree. Recommend ST to continue to follow and to f/u at d/c location to implement dysphagia therapy with laryngeal strengthening exercises. Communicated with MD who will also clarify goals of care. SLP Visit Diagnosis Dysphagia, pharyngeal phase (R13.13) Attention and concentration deficit following -- Frontal lobe and executive function deficit following -- Impact on safety and function Moderate aspiration risk   CHL IP TREATMENT RECOMMENDATION 03/13/2020 Treatment Recommendations Therapy as outlined in treatment plan below   Prognosis 03/13/2020 Prognosis for Safe Diet Advancement Fair Barriers to Reach Goals Severity of deficits Barriers/Prognosis Comment -- CHL IP DIET RECOMMENDATION 03/13/2020 SLP Diet Recommendations Dysphagia 3 (Mech soft) solids;Thin liquid Liquid  Administration via Spoon Medication Administration Whole meds with puree Compensations Slow rate;Small sips/bites Postural Changes Remain semi-upright after after feeds/meals (Comment);Seated upright at 90 degrees   CHL IP OTHER RECOMMENDATIONS 03/13/2020 Recommended Consults -- Oral Care Recommendations Oral care BID Other Recommendations Clarify dietary restrictions   CHL IP FOLLOW UP RECOMMENDATIONS 03/13/2020 Follow up Recommendations Skilled Nursing facility   [facility] IP FREQUENCY AND DURATION 03/13/2020 Speech Therapy Frequency (ACUTE ONLY) min 2x/week Treatment Duration 1 week      CHL IP ORAL PHASE 03/13/2020 Oral Phase WFL Oral - Pudding Teaspoon -- Oral - Pudding Cup -- Oral - Honey Teaspoon -- Oral - Honey Cup -- Oral - Nectar Teaspoon -- Oral - Nectar Cup -- Oral - Nectar Straw -- Oral - Thin Teaspoon -- Oral - Thin Cup -- Oral - Thin Straw -- Oral - Puree -- Oral - Mech Soft -- Oral - Regular -- Oral - Multi-Consistency -- Oral - Pill -- Oral Phase - Comment --  CHL IP PHARYNGEAL PHASE 03/13/2020 Pharyngeal Phase -- Pharyngeal- Pudding Teaspoon -- Pharyngeal -- Pharyngeal- Pudding Cup -- Pharyngeal -- Pharyngeal- Honey Teaspoon -- Pharyngeal -- Pharyngeal- Honey Cup -- Pharyngeal Material enters airway, passes BELOW cords without attempt by patient to eject out (silent aspiration) Pharyngeal- Nectar Teaspoon -- Pharyngeal -- Pharyngeal- Nectar Cup -- Pharyngeal Material enters airway, passes BELOW cords without attempt by patient to eject out (silent aspiration) Pharyngeal- Nectar Straw -- Pharyngeal -- Pharyngeal- Thin Teaspoon -- Pharyngeal Material enters airway, passes BELOW cords without attempt by patient to eject out (silent aspiration) Pharyngeal- Thin Cup -- Pharyngeal Material enters airway, passes BELOW cords without attempt by patient to eject out (silent aspiration) Pharyngeal- Thin Straw -- Pharyngeal Material enters airway, passes BELOW cords without attempt by patient to eject out (silent  aspiration) Pharyngeal- Puree -- Pharyngeal -- Pharyngeal- Mechanical Soft -- Pharyngeal -- Pharyngeal- Regular -- Pharyngeal -- Pharyngeal- Multi-consistency -- Pharyngeal -- Pharyngeal- Pill -- Pharyngeal -- Pharyngeal Comment --  No flowsheet data found. Amelia H. Romie Levee, CCC-SLP Speech Language Pathologist  Wende Bushy 03/13/2020, 12:16 PM                CBC Recent Labs  Lab 03/11/20 1457 03/12/20 0457 03/16/20 0511  WBC 5.8 7.1 5.6  HGB 9.4* 9.4* 9.6*  HCT 28.7* 30.1* 31.1*  PLT 130* 276 290  MCV 99.3 99.7 99.4  MCH 32.5 31.1 30.7  MCHC 32.8 31.2 30.9  RDW 20.5* 20.0* 18.4*  LYMPHSABS 1.2  --   --   MONOABS 0.3  --   --   EOSABS 0.1  --   --   BASOSABS 0.0  --   --     Chemistries  Recent Labs  Lab 03/11/20 1457 03/12/20 0457 03/15/20 0608  NA 137 138 138  K 4.7 4.0 2.9*  CL 101 104 104  CO2 25 24 25   GLUCOSE 102* 83 98  BUN 21 19 15   CREATININE 0.89 0.83 0.76  CALCIUM 8.2* 7.8* 7.6*  MG  --   --  1.1*  AST 20  --   --   ALT 11  --   --   ALKPHOS 121  --   --   BILITOT 0.8  --   --    ------------------------------------------------------------------------------------------------------------------ No results for input(s): CHOL, HDL, LDLCALC, TRIG, CHOLHDL, LDLDIRECT in the last 72 hours.  Lab Results  Component Value Date   HGBA1C 6.5 (H) 03/12/2020   ------------------------------------------------------------------------------------------------------------------ No results for input(s): TSH, T4TOTAL, T3FREE, THYROIDAB in the last 72 hours.  Invalid input(s): FREET3 ------------------------------------------------------------------------------------------------------------------ No results for input(s): VITAMINB12, FOLATE, FERRITIN, TIBC, IRON, RETICCTPCT in the last 72 hours.  Coagulation profile Recent Labs  Lab 03/11/20 1631  INR 1.7*    No results for input(s): DDIMER in the last 72 hours.  Cardiac Enzymes No results for  input(s): CKMB, TROPONINI, MYOGLOBIN in the last 168 hours.  Invalid input(s): CK ------------------------------------------------------------------------------------------------------------------    Component Value Date/Time   BNP 241.0 (H) 04/20/2018 6283     Roxan Hockey M.D on 03/16/2020 at 8:47 PM  Go to [email or web address] - for contact info  [facility] - Office  [phone]

## 2020-03-16 NOTE — Care Management Important Message (Signed)
Important Message  Patient Details  Name: Holly Sparks MRN: [id number] Date of Birth: 1941-09-24   Medicare Important Message Given:  Yes     Corey Harold 03/16/2020, 12:09 PM

## 2020-03-17 DIAGNOSIS — Z7189 Other specified counseling: Secondary | ICD-10-CM

## 2020-03-17 DIAGNOSIS — Z515 Encounter for palliative care: Secondary | ICD-10-CM

## 2020-03-17 DIAGNOSIS — I519 Heart disease, unspecified: Secondary | ICD-10-CM

## 2020-03-17 DIAGNOSIS — I4891 Unspecified atrial fibrillation: Secondary | ICD-10-CM

## 2020-03-17 DIAGNOSIS — J189 Pneumonia, unspecified organism: Secondary | ICD-10-CM | POA: Diagnosis present

## 2020-03-17 DIAGNOSIS — S0990XA Unspecified injury of head, initial encounter: Secondary | ICD-10-CM | POA: Diagnosis present

## 2020-03-17 LAB — CBC
HCT: 33 % — ABNORMAL LOW (ref 36.0–46.0)
Hemoglobin: 10.4 g/dL — ABNORMAL LOW (ref 12.0–15.0)
MCH: 30.7 pg (ref 26.0–34.0)
MCHC: 31.5 g/dL (ref 30.0–36.0)
MCV: 97.3 fL (ref 80.0–100.0)
Platelets: 296 10*3/uL (ref 150–400)
RBC: 3.39 MIL/uL — ABNORMAL LOW (ref 3.87–5.11)
RDW: 17.9 % — ABNORMAL HIGH (ref 11.5–15.5)
WBC: 4.9 10*3/uL (ref 4.0–10.5)
nRBC: 0 % (ref 0.0–0.2)

## 2020-03-17 LAB — GLUCOSE, CAPILLARY
Glucose-Capillary: 108 mg/dL — ABNORMAL HIGH (ref 70–99)
Glucose-Capillary: 118 mg/dL — ABNORMAL HIGH (ref 70–99)
Glucose-Capillary: 128 mg/dL — ABNORMAL HIGH (ref 70–99)
Glucose-Capillary: 146 mg/dL — ABNORMAL HIGH (ref 70–99)
Glucose-Capillary: 183 mg/dL — ABNORMAL HIGH (ref 70–99)
Glucose-Capillary: 185 mg/dL — ABNORMAL HIGH (ref 70–99)
Glucose-Capillary: 210 mg/dL — ABNORMAL HIGH (ref 70–99)

## 2020-03-17 LAB — BASIC METABOLIC PANEL
Anion gap: 9 (ref 5–15)
BUN: 10 mg/dL (ref 8–23)
CO2: 28 mmol/L (ref 22–32)
Calcium: 7.9 mg/dL — ABNORMAL LOW (ref 8.9–10.3)
Chloride: 101 mmol/L (ref 98–111)
Creatinine, Ser: 0.71 mg/dL (ref 0.44–1.00)
GFR calc Af Amer: >60 mL/min (ref >60)
GFR calc non Af Amer: >60 mL/min (ref >60)
Glucose, Bld: 159 mg/dL — ABNORMAL HIGH (ref 70–99)
Potassium: 2.9 mmol/L — ABNORMAL LOW (ref 3.5–5.1)
Sodium: 138 mmol/L (ref 135–145)

## 2020-03-17 MED ORDER — LORAZEPAM 0.5 MG PO TABS: 0.5000 mg | ORAL_TABLET | Freq: Three times a day (TID) | ORAL | 0 refills | Status: DC | PRN

## 2020-03-17 MED ORDER — ONDANSETRON 4 MG PO TBDP: 4.0000 mg | ORAL_TABLET | Freq: Three times a day (TID) | ORAL | 0 refills | Status: AC | PRN

## 2020-03-17 MED ORDER — ACETAMINOPHEN 325 MG PO TABS: 650.0000 mg | ORAL_TABLET | Freq: Four times a day (QID) | ORAL | 3 refills | Status: AC | PRN

## 2020-03-17 MED ORDER — HYDROCERIN EX CREA: 1.0000 "application " | TOPICAL_CREAM | Freq: Every day | CUTANEOUS | 0 refills | Status: AC

## 2020-03-17 MED ORDER — OXYCODONE HCL 5 MG PO TABS: 5.0000 mg | ORAL_TABLET | ORAL | 0 refills | Status: AC | PRN

## 2020-03-17 MED ORDER — ENSURE COMPLETE PO LIQD: 237.0000 mL | Freq: Two times a day (BID) | ORAL | 1 refills | Status: AC

## 2020-03-17 NOTE — Progress Notes (Signed)
Patient Demographics:    Holly Sparks, is a 79 y.o. female, DOB - 06-19-1941, HOZ:[id number]  Admit date - 03/11/2020   Admitting Physician Starleen Arms, MD  Outpatient Primary MD for the patient is Gareth Morgan, MD  LOS - 6   Chief Complaint  Patient presents with  . Fall        Subjective:    Holly Sparks cough with oral intake -More awake -Hypoxia and oxygen requirement persist-- 3 L/min -Plan of care discussed with son, patient on okay to stop Eliquis upon discharge back to facility  -Patient's son and family decided that patient can be discharged to [facility] on 03/17/2020, hospice requires about 24 hours to arrange home O2 and other services prior to patient being allowed to go to [place] [facility] [facility] -Anticipate discharge on 03/18/2020 after hospice has made arrangements for patient to go   Assessment  & Plan :    Active Problems:   Grade I diastolic dysfunction   Diabetes (HCC)   Essential hypertension, benign   Expressive aphasia   Left middle cerebral artery stroke (HCC)   Chronic anticoagulation   Type 2 diabetes mellitus with circulatory disorder (HCC)   Atrial fibrillation with RVR (HCC)   Sepsis (HCC)   Head injury   Pneumonia of both lungs due to infectious organism   Goals of care, counseling/discussion   Palliative care by specialist   Encounter for hospice care discussion   Brief Summary 79 y.o.female,withhistory significant for diabetes, GERD, hypertension and history of atrial fibrillation on Eliquis, history of CVA with expressive aphasia and dementia, hypertension, hyperlipidemia -Admitted from [facility] after falling out of her wheelchair while on Eliquis with concerns for lethargy and metabolic encephalopathy and sepsis secondary to pneumonia  discharge back to ALF with hospice  and palliative care services ---with plans to possibly transition to  full comfort care in the near future if condition declines further   A/p 1) sepsis secondary to pneumonia--- -received Rocephin and vancomycin in the ED, Completed 7 days of IV Zosyn -Dyspnea and coughand hypoxia has improved -He does use oxygen at facility  -2)acute metabolic encephalopathy--- secondary to #1 above, superimposed on underlying dementia--treated sepsis as above #1  3) status post fall from wheelchair prior to admission while on Eliquis--- multiple bruises/abrasions to bilateral upper and lower extremities and face greater on the right side--- POA -Wound care specialist consult appreciated -cleanse wounds with soap and water, rinse and pat day, followed by application of a thin layer of a humectant, Eucerin. Open lesions will be covered with the moisture retentive, nonadherent, antimicrobial, xeroform and topped with a dry dressing. A Kerlix roll gauze wrapped from just below toes to just below knees will secure dressings and protect the LEs from inadvertent trauma. Bilateral feet will be placed into pressure redistribution heel boots. A pressure redistribution chair pad is provided for her for when OOB in the chair at her facility post discharge. A sacral prophylactic foam dressing to the sacrum is to be placed while in house.  4) A. fib with RVR--- history of TIA/CVA-she is anticoagulated with Eliquis -c/n Cardizem CD and p.o. metoprolol for rate control. Her son reports that she has fallen 10 times since being at assisted living facility.  Family has decided to discontinue Eliquis given recurrent falls   5)DM2-A1c 6.5, reflecting excellent DM control PTA -Continue to hold Metformin, Use Novolog/Humalog Sliding scale insulin with Accu-Cheks/Fingersticks as ordered  6)Dysphagia--- Speech eval appreciated, patient appears to be having penetration with both thick and thin liquids. She would be considered high risk for aspiration. - Dysphagia 3 (Mech soft)  solids;Thin liquid  7)Social/Ethics-- GOC----dementia--- and poor oral intake swallowing -palliative care consult appreciated,  --Extensive discussion with husband by palliative care service --Unfortunately, it appears that husband may not have completely grasped conversation and was not able to relay the details of the conversation to me today -I had another goals of care conversation with the patient's son, Holly Sparks who is power of attorney -After discussing with family, patient reports that family wishes the patient return to [place] with hospice involvement-DNR/DNI--  Disposition/Need for in-Hospital Stay-hospice arranging for home services at [facility]   -Patient From: [facility] D/C Place:  discharge back to ALF with hospice  and palliative care services ---with plans to possibly transition to full comfort care in the near future if condition declines further Barriers: Not Clinically Stable--awaiting hospice to set up services anticipate discharge 03/18/2020  - -Patient's son and family decided that patient can be discharged to [facility] on 03/17/2020, hospice requires about 24 hours to arrange home O2 and other services prior to patient being allowed to go to [place] [facility] -Anticipate discharge on 03/18/2020 after hospice has made arrangements for patient to go   code Status:DNR  Family Communication: -discussed with son Holly Sparks who is POA Consults :Speech pathologist/palliative care    DVT Prophylaxis  : Eliquis  Lab Results  Component Value Date   PLT 296 03/17/2020    Inpatient Medications  Scheduled Meds: . apixaban  5 mg Oral BID  . Chlorhexidine Gluconate Cloth  6 each Topical Q0600  . diltiazem  120 mg Oral Daily  . hydrocerin   Topical Daily  . insulin aspart  0-6 Units Subcutaneous Q4H  . metoprolol tartrate  12.5 mg Oral BID  . mupirocin ointment  1 application Nasal BID   Continuous Infusions: . sodium chloride 20 mL/hr at 03/15/20 0456    . piperacillin-tazobactam (ZOSYN)  IV 3.375 g (03/17/20 1447)   PRN Meds:.acetaminophen **OR** acetaminophen, albuterol    Anti-infectives (From admission, onward)   Start     Dose/Rate Route Frequency Ordered Stop   03/11/20 1630  piperacillin-tazobactam (ZOSYN) IVPB 3.375 g     3.375 g 12.5 mL/hr over 240 Minutes Intravenous Every 8 hours 03/11/20 1622     03/11/20 1615  metroNIDAZOLE (FLAGYL) IVPB 500 mg  Status:  Discontinued     500 mg 100 mL/hr over 60 Minutes Intravenous Every 8 hours 03/11/20 1614 03/11/20 1622   03/11/20 1500  vancomycin (VANCOCIN) IVPB 1000 mg/200 mL premix     1,000 mg 200 mL/hr over 60 Minutes Intravenous  Once 03/11/20 1451 03/11/20 1650   03/11/20 1430  cefTRIAXone (ROCEPHIN) 1 g in sodium chloride 0.9 % 100 mL IVPB     1 g 200 mL/hr over 30 Minutes Intravenous  Once 03/11/20 1419 03/11/20 1539        Objective:   Vitals:   03/17/20 0510 03/17/20 0516 03/17/20 0744 03/17/20 1446  BP: 121/84   (!) 151/86  Pulse: (!) 111 93  68  Resp: 20     Temp: 97.9 F (36.6 C)   97.9 F (36.6 C)  TempSrc: Oral   Oral  SpO2: 100%  100% 98% 100%  Weight:      Height:        Wt Readings from Last 3 Encounters:  03/11/20 57.2 kg  11/28/19 58.1 kg  11/18/19 58.1 kg     Intake/Output Summary (Last 24 hours) at 03/17/2020 1639 Last data filed at 03/17/2020 1200 Gross per 24 hour  Intake 150 ml  Output 250 ml  Net -100 ml     Physical Exam Exam General exam:Alert, awake, oriented x 3 Nose- [place] 3L/min Respiratory system: Improving air movement, no wheezing cardiovascular system:Irregular. No murmurs, rubs, gallops. Gastrointestinal system:Abdomen is nondistended, soft and nontender.  Normal bowel sounds heard. Central nervous system:Alert and oriented.  Patient with generalized weakness, no focal neurological deficits. Extremities: No C/C/E, +pedal pulses Skin: No rashes, lesions or ulcers Psychiatry:Calm, pleasant.     Data Review:    Micro Results Recent Results (from the past 240 hour(s))  Urine culture     Status: Abnormal   Collection Time: 03/11/20  1:49 PM   Specimen: Urine, Catheterized  Result Value Ref Range Status   Specimen Description   Final    URINE, CATHETERIZED Performed at [facility], [address] [place], [place] [address]    Special Requests   Final    NONE Performed at [facility], [address] [place], [place] [address]    Culture >=100,000 COLONIES/mL KLEBSIELLA PNEUMONIAE (A)  Final   Report Status 03/13/2020 FINAL  Final   Organism ID, Bacteria KLEBSIELLA PNEUMONIAE (A)  Final      Susceptibility   Klebsiella pneumoniae - MIC*    AMPICILLIN RESISTANT Resistant     CEFAZOLIN <=4 SENSITIVE Sensitive     CEFTRIAXONE <=1 SENSITIVE Sensitive     CIPROFLOXACIN <=0.25 SENSITIVE Sensitive     GENTAMICIN <=1 SENSITIVE Sensitive     IMIPENEM 1 SENSITIVE Sensitive     NITROFURANTOIN 64 INTERMEDIATE Intermediate     TRIMETH/SULFA >=320 RESISTANT Resistant     AMPICILLIN/SULBACTAM <=2 SENSITIVE Sensitive     PIP/TAZO <=4 SENSITIVE Sensitive     * >=100,000 COLONIES/mL KLEBSIELLA PNEUMONIAE  Blood Culture (routine x 2)     Status: None   Collection Time: 03/11/20  2:57 PM   Specimen: Right Antecubital; Blood  Result Value Ref Range Status   Specimen Description RIGHT ANTECUBITAL  Final   Special Requests   Final    BOTTLES DRAWN AEROBIC ONLY Blood Culture results may not be optimal due to an inadequate volume of blood received in culture bottles   Culture   Final    NO GROWTH 5 DAYS Performed at [facility], [address] [place], [place] [address]    Report Status 03/16/2020 FINAL  Final  Blood Culture (routine x 2)     Status: None   Collection Time: 03/11/20  2:57 PM   Specimen: BLOOD RIGHT HAND  Result Value Ref Range Status   Specimen Description BLOOD RIGHT HAND  Final   Special Requests   Final    BOTTLES DRAWN AEROBIC AND ANAEROBIC Blood Culture results may not be  optimal due to an inadequate volume of blood received in culture bottles   Culture   Final    NO GROWTH 5 DAYS Performed at [facility], [address] [place], [place] [address]    Report Status 03/16/2020 FINAL  Final  Respiratory Panel by RT PCR (Flu A&B, Covid) - Nasopharyngeal Swab     Status: None   Collection Time: 03/11/20  4:30 PM   Specimen:  Nasopharyngeal Swab  Result Value Ref Range Status   SARS Coronavirus 2 by RT PCR NEGATIVE NEGATIVE Final    Comment: (NOTE) SARS-CoV-2 target nucleic acids are NOT DETECTED. The SARS-CoV-2 RNA is generally detectable in upper respiratoy specimens during the acute phase of infection. The lowest concentration of SARS-CoV-2 viral copies this assay can detect is 131 copies/mL. A negative result does not preclude SARS-Cov-2 infection and should not be used as the sole basis for treatment or other patient management decisions. A negative result may occur with  improper specimen collection/handling, submission of specimen other than nasopharyngeal swab, presence of viral mutation(s) within the areas targeted by this assay, and inadequate number of viral copies (<131 copies/mL). A negative result must be combined with clinical observations, patient history, and epidemiological information. The expected result is Negative. Fact Sheet for Patients:  https://www.Wisor.com/ Fact Sheet for Healthcare Providers:  https://www.young.biz/ This test is not yet ap proved or cleared by the [place] FDA and  has been authorized for detection and/or diagnosis of SARS-CoV-2 by FDA under an Emergency Use Authorization (EUA). This EUA will remain  in effect (meaning this test can be used) for the duration of the COVID-19 declaration under Section 564(b)(1) of the Act, 21 U.S.C. section 360bbb-3(b)(1), unless the authorization is terminated or revoked sooner.    Influenza A by PCR NEGATIVE NEGATIVE Final    Influenza B by PCR NEGATIVE NEGATIVE Final    Comment: (NOTE) The Xpert Xpress SARS-CoV-2/FLU/RSV assay is intended as an aid in  the diagnosis of influenza from Nasopharyngeal swab specimens and  should not be used as a sole basis for treatment. Nasal washings and  aspirates are unacceptable for Xpert Xpress SARS-CoV-2/FLU/RSV  testing. Fact Sheet for Patients: https://www.Peterka.com/ Fact Sheet for Healthcare Providers: https://www.young.biz/ This test is not yet approved or cleared by the [place] FDA and  has been authorized for detection and/or diagnosis of SARS-CoV-2 by  FDA under an Emergency Use Authorization (EUA). This EUA will remain  in effect (meaning this test can be used) for the duration of the  Covid-19 declaration under Section 564(b)(1) of the Act, 21  U.S.C. section 360bbb-3(b)(1), unless the authorization is  terminated or revoked. Performed at [facility], [address], [place], [place] [address]   MRSA PCR Screening     Status: Abnormal   Collection Time: 03/11/20  4:43 PM   Specimen: Nasal Mucosa; Nasopharyngeal  Result Value Ref Range Status   MRSA by PCR POSITIVE (A) NEGATIVE Final    Comment:        The GeneXpert MRSA Assay (FDA approved for NASAL specimens only), is one component of a comprehensive MRSA colonization surveillance program. It is not intended to diagnose MRSA infection nor to guide or monitor treatment for MRSA infections. RESULT CALLED TO, READ BACK BY AND VERIFIED WITH: KILMER,T ON 03/11/20 AT 2155 BY LOY,C Performed at [facility], [address] [place], [place] [address]     Radiology Reports CT Head Wo Contrast  Result Date: 03/11/2020 CLINICAL DATA:  Fall from wheelchair yesterday while on blood thinners, altered mental status EXAM: CT HEAD WITHOUT CONTRAST TECHNIQUE: Contiguous axial images were obtained from the base of the skull through the vertex without intravenous contrast.  COMPARISON:  11/28/2019 FINDINGS: Brain: Chronic atrophic and ischemic changes are again seen and stable. No findings to suggest acute hemorrhage, acute infarction or space-occupying mass lesion are noted. Stable old infarcts in the left MCA distribution are again seen. Vascular: No hyperdense vessel  or unexpected calcification. Skull: Normal. Negative for fracture or focal lesion. Sinuses/Orbits: Mild opacification of the posterior left ethmoid air cells is again noted. Other: None IMPRESSION: Chronic atrophic and ischemic changes without acute abnormality. Electronically Signed   By: Alcide Clever M.D.   On: 03/11/2020 15:51   DG Chest Port 1 View  Result Date: 03/11/2020 CLINICAL DATA:  79 year old female with fever and cough. Fall from wheelchair yesterday. Negative for COVID-19 last year, being retested today. EXAM: PORTABLE CHEST 1 VIEW COMPARISON:  Portable chest 11/07/2019 and earlier. FINDINGS: Portable AP semi upright view at 1432 hours. New confluent abnormal opacities in the left lung about the hilum, and at the right lung base. Evidence of superimposed right pleural effusion, including some fluid suspected in the right minor fissure. The left lung base and right upper lobe remain well pneumatized. Lower lung volumes overall. Stable mediastinal contours. Visualized tracheal air column is within normal limits. Prior cervical ACDF. No acute osseous abnormality identified. IMPRESSION: 1. Confluent new left perihilar and right lung base opacity most suspicious for multilobar pneumonia. Aspiration is also a consideration. 2. Superimposed small right pleural effusion suspected. Electronically Signed   By: Odessa Fleming M.D.   On: 03/11/2020 15:14   DG Swallowing Func-Speech Pathology  Result Date: 03/13/2020 Objective Swallowing Evaluation: Type of Study: MBS-Modified Barium Swallow Study  Patient Details Name: QUETZALLY CALLAS MRN: [id number] Date of Birth: [birth year] Today's Date: 03/13/2020 Time: SLP Start  Time (ACUTE ONLY): 0901 -SLP Stop Time (ACUTE ONLY): 0922 SLP Time Calculation (min) (ACUTE ONLY): 21 min Past Medical History: Past Medical History: Diagnosis Date . Diabetes (HCC)   Type 2 x 10 yrs . Dysrhythmia   AFib . GERD (gastroesophageal reflux disease)  . Grade I diastolic dysfunction 09/03/2018 . High cholesterol  . Hypertension   x 10 yrs . Paroxysmal a-fib: Remote hx of afib 06/15/2015 . Stroke [facility])   no deficits Past Surgical History: Past Surgical History: Procedure Laterality Date . CATARACT EXTRACTION W/PHACO Left 06/06/2016  Procedure: CATARACT EXTRACTION PHACO AND INTRAOCULAR LENS PLACEMENT (IOC);  Surgeon: Gemma Payor, MD;  Location: [facility] ORS;  Service: Ophthalmology;  Laterality: Left;  CDE: 8.32 . COLONOSCOPY   . COLONOSCOPY N/A 05/08/2013  Procedure: COLONOSCOPY;  Surgeon: Malissa Hippo, MD;  Location: [facility] ENDO SUITE;  Service: Endoscopy;  Laterality: N/A;  1015 . NECK SURGERY    x 2 for spur and disc problems HPI: Holly Sparks  is a 79 y.o. female, withhistory significant for diabetes, GERD, hypertension and history of atrial fibrillation on Eliquis, history of CVA with expressive aphasia and dementia, hypertension, hyperlipidemia, patient is a resident at SNF, she presents from SNF to evaluation for increased lethargy and generalized weakness, and fall yesterday, apparently patient fell from her wheelchair yesterday, where she had multiple bruising, she is on Eliquis, as well today she was noted to be more sleepy and less interactive today, she was sent for further evaluation, especially she struck her forehead when she fell from the chair, patient is poor historian, she herself cannot provide any complaints. In the ED she was noted to have multiple bruising from her fall, and forehead and right periorbital area, CT head with no evidence of bleed or any acute findings, she was noted to be febrile 100.8, as well patient was noted to have cough, chest x-ray was significant for right lower base  opacity, and she had elevated lactic acid at 2.2, her urinalysis was negative, patient received broad-spectrum antibiotic in ED, blood cultures  were done and [facility] was called to admit. BSE 4/15 indicated need for MBS  Subjective: "His nanme is Mortimer Fries." (husband) Assessment / Plan / Recommendation CHL IP CLINICAL IMPRESSIONS 03/13/2020 Clinical Impression Pt presents with moderate sensorimotor pharyngeal dysphagia characterized by penetration of most all presentations of liquids (thin, NTL and HTL)  during the swallow and eventual silent aspiration of (trace to moderate amounts) residuals after the swallow. Pt demonstrates good hyolaryngeal elevation however note inadequate laryngeal vestibule closure and decreased pharyngeal squeeze during the swallow consequently resulting in residuals visualized in valleculae, laryngeal vestibule and interarytenoid space which are eventually aspirated at varying levels of severity. Chin tuck and effortful swallow were attempted and both ineffective in decreasing residue, or in limiting or eliminating penetration/aspiration.  Note larger amounts of penetration/aspiration with thicker consistencies despite providing varying volumes. Penetration but no aspiration was noted with puree and solid textures; moderate residue of solids in the valleculae was cleared with a reflexive additional swallow. The smallest amount of laryngeal vestibule residue (which is eventually aspirated) was noted with tsp sips of thin liquids. Recommend D3/mech soft and upgrade to thin liquids with administration by TSP. Recommend meds to be administered whole with puree. Recommend ST to continue to follow and to f/u at d/c location to implement dysphagia therapy with laryngeal strengthening exercises. Communicated with MD who will also clarify goals of care. SLP Visit Diagnosis Dysphagia, pharyngeal phase (R13.13) Attention and concentration deficit following -- Frontal lobe and executive function deficit  following -- Impact on safety and function Moderate aspiration risk   CHL IP TREATMENT RECOMMENDATION 03/13/2020 Treatment Recommendations Therapy as outlined in treatment plan below   Prognosis 03/13/2020 Prognosis for Safe Diet Advancement Fair Barriers to Reach Goals Severity of deficits Barriers/Prognosis Comment -- CHL IP DIET RECOMMENDATION 03/13/2020 SLP Diet Recommendations Dysphagia 3 (Mech soft) solids;Thin liquid Liquid Administration via Spoon Medication Administration Whole meds with puree Compensations Slow rate;Small sips/bites Postural Changes Remain semi-upright after after feeds/meals (Comment);Seated upright at 90 degrees   CHL IP OTHER RECOMMENDATIONS 03/13/2020 Recommended Consults -- Oral Care Recommendations Oral care BID Other Recommendations Clarify dietary restrictions   CHL IP FOLLOW UP RECOMMENDATIONS 03/13/2020 Follow up Recommendations Skilled Nursing facility   [facility] IP FREQUENCY AND DURATION 03/13/2020 Speech Therapy Frequency (ACUTE ONLY) min 2x/week Treatment Duration 1 week      CHL IP ORAL PHASE 03/13/2020 Oral Phase WFL Oral - Pudding Teaspoon -- Oral - Pudding Cup -- Oral - Honey Teaspoon -- Oral - Honey Cup -- Oral - Nectar Teaspoon -- Oral - Nectar Cup -- Oral - Nectar Straw -- Oral - Thin Teaspoon -- Oral - Thin Cup -- Oral - Thin Straw -- Oral - Puree -- Oral - Mech Soft -- Oral - Regular -- Oral - Multi-Consistency -- Oral - Pill -- Oral Phase - Comment --  CHL IP PHARYNGEAL PHASE 03/13/2020 Pharyngeal Phase -- Pharyngeal- Pudding Teaspoon -- Pharyngeal -- Pharyngeal- Pudding Cup -- Pharyngeal -- Pharyngeal- Honey Teaspoon -- Pharyngeal -- Pharyngeal- Honey Cup -- Pharyngeal Material enters airway, passes BELOW cords without attempt by patient to eject out (silent aspiration) Pharyngeal- Nectar Teaspoon -- Pharyngeal -- Pharyngeal- Nectar Cup -- Pharyngeal Material enters airway, passes BELOW cords without attempt by patient to eject out (silent aspiration) Pharyngeal- Nectar Straw  -- Pharyngeal -- Pharyngeal- Thin Teaspoon -- Pharyngeal Material enters airway, passes BELOW cords without attempt by patient to eject out (silent aspiration) Pharyngeal- Thin Cup -- Pharyngeal Material enters airway, passes BELOW cords without attempt by patient  to eject out (silent aspiration) Pharyngeal- Thin Straw -- Pharyngeal Material enters airway, passes BELOW cords without attempt by patient to eject out (silent aspiration) Pharyngeal- Puree -- Pharyngeal -- Pharyngeal- Mechanical Soft -- Pharyngeal -- Pharyngeal- Regular -- Pharyngeal -- Pharyngeal- Multi-consistency -- Pharyngeal -- Pharyngeal- Pill -- Pharyngeal -- Pharyngeal Comment --  No flowsheet data found. Amelia H. Myer Haff MA, CCC-SLP Speech Language Pathologist Georgetta Haber 03/13/2020, 12:16 PM                CBC Recent Labs  Lab 03/11/20 1457 03/12/20 0457 03/16/20 0511 03/17/20 0919  WBC 5.8 7.1 5.6 4.9  HGB 9.4* 9.4* 9.6* 10.4*  HCT 28.7* 30.1* 31.1* 33.0*  PLT 130* 276 290 296  MCV 99.3 99.7 99.4 97.3  MCH 32.5 31.1 30.7 30.7  MCHC 32.8 31.2 30.9 31.5  RDW 20.5* 20.0* 18.4* 17.9*  LYMPHSABS 1.2  --   --   --   MONOABS 0.3  --   --   --   EOSABS 0.1  --   --   --   BASOSABS 0.0  --   --   --     Chemistries  Recent Labs  Lab 03/11/20 1457 03/12/20 0457 03/15/20 0608 03/17/20 0919  NA 137 138 138 138  K 4.7 4.0 2.9* 2.9*  CL 101 104 104 101  CO2 GLUCOSE 102* 83 98 159*  BUN CREATININE 0.89 0.83 0.76 0.71  CALCIUM 8.2* 7.8* 7.6* 7.9*  MG  --   --  1.1*  --   AST 20  --   --   --   ALT 11  --   --   --   ALKPHOS 121  --   --   --   BILITOT 0.8  --   --   --    ------------------------------------------------------------------------------------------------------------------ No results for input(s): CHOL, HDL, LDLCALC, TRIG, CHOLHDL, LDLDIRECT in the last 72 hours.  Lab Results  Component Value Date   HGBA1C 6.5 (H) 03/12/2020    ------------------------------------------------------------------------------------------------------------------ No results for input(s): TSH, T4TOTAL, T3FREE, THYROIDAB in the last 72 hours.  Invalid input(s): FREET3 ------------------------------------------------------------------------------------------------------------------ No results for input(s): VITAMINB12, FOLATE, FERRITIN, TIBC, IRON, RETICCTPCT in the last 72 hours.  Coagulation profile Recent Labs  Lab 03/11/20 1631  INR 1.7*    No results for input(s): DDIMER in the last 72 hours.  Cardiac Enzymes No results for input(s): CKMB, TROPONINI, MYOGLOBIN in the last 168 hours.  Invalid input(s): CK ------------------------------------------------------------------------------------------------------------------    Component Value Date/Time   BNP 241.0 (H) 04/20/2018 4098     Shon Hale M.D on 03/17/2020 at 4:39 PM  Go to [email or web address] - for contact info  [facility] - Office  [phone]

## 2020-03-17 NOTE — TOC Progression Note (Signed)
Transition of Care [facility]) - Progression Note    Patient Details  Name: Holly Sparks MRN: [id number] Date of Birth: Oct 20, 1941  Transition of Care [facility] CM/SW Contact  Annice Needy, LCSW Phone Number: 03/17/2020, 4:39 PM  Clinical Narrative:    Per palliative care, patient's family is agreeable for patient to return to [facility] with hospice services. Spoke with son, [name], who chooses [facility]. Hospice referral made to [facility]. Cassandra confims receipt of referral. Advised patient will need home oxygen. Hospice to contact family for enrollment, and order oxygen. Marcelino Duster at [facility] advised of discharge plans.  Discharge scheduled for tomorrow 03/18/20.         Expected Discharge Plan and Services           Expected Discharge Date: 03/17/20                                     Social Determinants of Health (SDOH) Interventions    Readmission Risk Interventions No flowsheet data found.

## 2020-03-17 NOTE — Discharge Summary (Addendum)
Holly Sparks, is a 79 y.o. female  DOB 03/12/1941  MRN [id number].  Admission date:  03/11/2020  Admitting Physician  Starleen Arms, MD  Discharge Date:  03/18/2020   Primary MD  Gareth Morgan, MD  Recommendations for primary care physician for things to follow:   1)Leg wounds--- multiple bruises/abrasions to bilateral upper and lower extremities and face greater on the right side--- POA -Wound care specialist consult appreciated -Wound dressings placed once to twice daily -cleanse wounds  with soap and water, rinse and pat day, followed by application of a thin layer of a humectant, Eucerin. Open lesions will be covered with the moisture retentive, nonadherent, antimicrobial, xeroform and topped with a dry dressing. A Kerlix roll gauze wrapped from just below toes to just below knees will secure dressings and protect the LEs from inadvertent trauma. Bilateral feet will be placed into pressure redistribution heel boots. A pressure redistribution chair pad is provided for her for when OOB in the chair at her facility post discharge. A sacral prophylactic foam dressing to the sacrum is to be placed while in house. 2) family request that they focus should be on comfort care and dignity 3)-please avoid transport to the hospital without first talking to [facility] Josephina Shih--- [phone]    4)-- Discharge back to [facility] with hospice/palliative care services--- family will consider transition to full comfort care in the near future if condition declines 5)Patient is high risk for Aspiration ----dysphagia 3 (Mech soft) solids;Thin liquid 6) give supplemental oxygen at 3 L/min continuously via nasal cannula 7)Please get  hospice consult at facility   Admission Diagnosis  Injury of head, initial encounter [S09.90XA] Fall, initial encounter [W19.XXXA] Sepsis (HCC) [A41.9] Anemia, unspecified type  [D64.9] Pneumonia of both lungs due to infectious organism, unspecified part of lung [J18.9]  Discharge Diagnosis  Injury of head, initial encounter [S09.90XA] Fall, initial encounter [W19.XXXA] Sepsis (HCC) [A41.9] Anemia, unspecified type [D64.9] Pneumonia of both lungs due to infectious organism, unspecified part of lung [J18.9]    Active Problems:   Grade I diastolic dysfunction   Diabetes (HCC)   Essential hypertension, benign   Expressive aphasia   Left middle cerebral artery stroke (HCC)   Chronic anticoagulation   Type 2 diabetes mellitus with circulatory disorder (HCC)   Atrial fibrillation with RVR (HCC)   Sepsis (HCC)   Head injury   Pneumonia of both lungs due to infectious organism   Goals of care, counseling/discussion   Palliative care by specialist   Encounter for hospice care discussion      Past Medical History:  Diagnosis Date  . Diabetes (HCC)    Type 2 x 10 yrs  . Dysrhythmia    AFib  . GERD (gastroesophageal reflux disease)   . Grade I diastolic dysfunction 09/03/2018  . High cholesterol   . Hypertension    x 10 yrs  . Paroxysmal a-fib: Remote hx of afib 06/15/2015  . Stroke [facility])    no deficits    Past Surgical History:  Procedure Laterality Date  .  CATARACT EXTRACTION W/PHACO Left 06/06/2016   Procedure: CATARACT EXTRACTION PHACO AND INTRAOCULAR LENS PLACEMENT (IOC);  Surgeon: Gemma Payor, MD;  Location: [facility] ORS;  Service: Ophthalmology;  Laterality: Left;  CDE: 8.32  . COLONOSCOPY    . COLONOSCOPY N/A 05/08/2013   Procedure: COLONOSCOPY;  Surgeon: Malissa Hippo, MD;  Location: [facility] ENDO SUITE;  Service: Endoscopy;  Laterality: N/A;  1015  . NECK SURGERY     x 2 for spur and disc problems     HPI  from the history and physical done on the day of admission:    Holly Sparks  is a 79 y.o. female, withhistory significant for diabetes, GERD, hypertension and history of atrial fibrillation on Eliquis, history of CVA with expressive aphasia and  dementia, hypertension, hyperlipidemia, patient is a resident at SNF, she presents from SNF to evaluation for increased lethargy and generalized weakness, and fall yesterday, apparently patient fell from her wheelchair yesterday, where she had multiple bruising, she is on Eliquis, as well today she was noted to be more sleepy and less interactive today, she was sent for further evaluation, especially she struck her forehead when she fell from the chair, patient is poor historian, she herself cannot provide any complaints. - in ED she was noted to have multiple bruising from her fall, and forehead and right periorbital area, CT head with no evidence of bleed or any acute findings, she was noted to be febrile 100.8, as well patient was noted to have cough, chest x-ray was significant for right lower base opacity, and she had elevated lactic acid at 2.2, her urinalysis was negative, patient received broad-spectrum antibiotic in ED, blood cultures were done and [facility] was called to admit.    Hospital Course:     Brief Summary 79 y.o.female,withhistory significant for diabetes, GERD, hypertension and history of atrial fibrillation on Eliquis, history of CVA with expressive aphasia and dementia, hypertension, hyperlipidemia -Admitted from [facility] after falling out of her wheelchair while on Eliquis with concerns for lethargy and metabolic encephalopathy and sepsis secondary to pneumonia  discharge back to ALF with hospice  and palliative care services ---with plans to possibly transition to full comfort care in the near future if condition declines further   A/p 1) sepsis secondary to pneumonia--- -received Rocephin and vancomycin in the ED,  Completed 7 days of IV Zosyn -Dyspnea and cough  and hypoxia has improved -He does use oxygen at facility  -2)acute metabolic encephalopathy--- secondary to #1 above,  superimposed on underlying dementia--treated sepsis as above #1  3) status post  fall from wheelchair prior to admission while on Eliquis--- multiple bruises/abrasions to bilateral upper and lower extremities and face greater on the right side--- POA -Wound care specialist consult appreciated -cleanse wounds  with soap and water, rinse and pat day, followed by application of a thin layer of a humectant, Eucerin. Open lesions will be covered with the moisture retentive, nonadherent, antimicrobial, xeroform and topped with a dry dressing. A Kerlix roll gauze wrapped from just below toes to just below knees will secure dressings and protect the LEs from inadvertent trauma. Bilateral feet will be placed into pressure redistribution heel boots. A pressure redistribution chair pad is provided for her for when OOB in the chair at her facility post discharge. A sacral prophylactic foam dressing to the sacrum is to be placed while in house.  4) A. fib with RVR--- history of TIA/CVA-she is anticoagulated with Eliquis -c/n  Cardizem CD and p.o. metoprolol  for rate control.  Her son reports that she has fallen 10 times since being at assisted living facility.   Family has decided to discontinue Eliquis given recurrent falls    5)DM2-A1c 6.5, reflecting excellent DM control PTA -Continue to hold Metformin, Use Novolog/Humalog Sliding scale insulin with Accu-Cheks/Fingersticks as ordered  6)Dysphagia--- Speech eval appreciated, patient appears to be having penetration with both thick and thin liquids.  She would be considered high risk for aspiration. - Dysphagia 3 (Mech soft) solids;Thin liquid  7)Social/Ethics-- GOC----dementia--- and poor oral intake swallowing -palliative care consult appreciated,  --Extensive discussion with husband by palliative care service --Unfortunately, it appears that husband may not have completely grasped conversation and was not able to relay the details of the conversation to me today -I had another goals of care conversation with the patient's son,  [name] who is power of attorney -After discussing with family, patient reports that family wishes the patient return to [place] if possible.  They are open to hospice if indicated.  Will need palliative care to readdress this with POA in a.m. -DNR/DNI  Disposition/Need for in-Hospital Stay- -Patient's son and family decided that patient can be discharged to [facility] on 03/17/2020, hospice requires about 24 hours to arrange home O2 and other services prior to patient being allowed to go to [place] [facility] -Anticipate discharge on 03/18/2020 after hospice has made arrangements for patient to go   -Patient From: [facility] D/C Place: [facility] with hospice  barriers: Not Clinically Stable-hospice making arrangements to set up services on 03/18/2020 - Code Status : DNR  Family Communication:  -discussed with  son [name] who is POA   Consults  : Speech pathologist/palliative care   Discharge Condition: stable  Follow UP--- PCP at [facility]  Diet and Activity recommendation:  As advised  Discharge Instructions    Discharge Instructions    Amb Referral to Palliative Care   Complete by: As directed    Hospice for symptom management   Call MD for:  difficulty breathing, headache or visual disturbances   Complete by: As directed    Call MD for:  extreme fatigue   Complete by: As directed    Call MD for:  persistant dizziness or light-headedness   Complete by: As directed    Call MD for:  persistant nausea and vomiting   Complete by: As directed    Call MD for:  severe uncontrolled pain   Complete by: As directed    Call MD for:  temperature >100.4   Complete by: As directed    Diet - low sodium heart healthy   Complete by: As directed    patient is high risk for aspiration ----dysphagia 3 (Mech soft) solids;Thin liquid   patient is high risk for aspiration ----dysphagia 3 (Mech soft) solids;Thin liquid   Discharge instructions   Complete by: As directed    1)Leg  wounds--- multiple bruises/abrasions to bilateral upper and lower extremities and face greater on the right side--- POA -Wound care specialist consult appreciated -Wound dressings placed once to twice daily -cleanse wounds  with soap and water, rinse and pat day, followed by application of a thin layer of a humectant, Eucerin. Open lesions will be covered with the moisture retentive, nonadherent, antimicrobial, xeroform and topped with a dry dressing. A Kerlix roll gauze wrapped from just below toes to just below knees will secure dressings and protect the LEs from inadvertent trauma. Bilateral feet will be placed into pressure redistribution heel boots.  A pressure redistribution chair pad is provided for her for when OOB in the chair at her facility post discharge. A sacral prophylactic foam dressing to the sacrum is to be placed while in house. 2) family request that they focus should be on comfort care and dignity 3)-please avoid transport to the hospital without first talking to [facility] Josephina Shih--- [phone]    4)-- Discharge back to [facility] with hospice/palliative care services--- family will consider transition to full comfort care in the near future if condition declines 5)Patient is high risk for Aspiration ----dysphagia 3 (Mech soft) solids;Thin liquid 6) give supplemental oxygen at 3 L/min continuously via nasal cannula 7) please get  hospice consult at facility     Increase activity slowly   Complete by: As directed         Discharge Medications     Allergies as of 03/18/2020   No Known Allergies     Medication List    STOP taking these medications   apixaban 5 MG Tabs tablet Commonly known as: ELIQUIS   furosemide 20 MG tablet Commonly known as: LASIX   magnesium oxide 400 MG tablet Commonly known as: MAG-OX   metFORMIN 1000 MG tablet Commonly known as: GLUCOPHAGE   ondansetron 4 MG tablet Commonly known as: ZOFRAN   pantoprazole 40 MG tablet Commonly  known as: PROTONIX   potassium chloride 10 MEQ tablet Commonly known as: KLOR-CON     TAKE these medications   acetaminophen 325 MG tablet Commonly known as: TYLENOL Take 2 tablets (650 mg total) by mouth every 6 (six) hours as needed for mild pain, fever or headache (or Fever >/= 101). What changed: reasons to take this   amitriptyline 25 MG tablet Commonly known as: ELAVIL Take 12.5 mg by mouth at bedtime.   diltiazem 120 MG 24 hr capsule Commonly known as: Cardizem CD Take 1 capsule (120 mg total) by mouth daily.   Doxepin HCl 3 MG Tabs Take 3 mg by mouth at bedtime.   feeding supplement (ENSURE COMPLETE) Liqd Take 237 mLs by mouth 2 (two) times daily between meals.   hydrocerin Crea Apply 1 application topically daily.   LORazepam 0.5 MG tablet Commonly known as: Ativan Take 1 tablet (0.5 mg total) by mouth every 8 (eight) hours as needed for anxiety or sleep.   metoprolol tartrate 25 MG tablet Commonly known as: LOPRESSOR Take 12.5 mg by mouth 2 (two) times daily.   ondansetron 4 MG disintegrating tablet Commonly known as: Zofran ODT Take 1 tablet (4 mg total) by mouth every 8 (eight) hours as needed for nausea or vomiting.   oxyCODONE 5 MG immediate release tablet Commonly known as: Roxicodone Take 1 tablet (5 mg total) by mouth every 4 (four) hours as needed for moderate pain or severe pain.            Durable Medical Equipment  (From admission, onward)         Start     Ordered   03/17/20 1630  For home use only DME oxygen  Once    Comments: SATURATION QUALIFICATIONS: (Thisnote is usedto comply with regulatory documentation for home oxygen)  Patient Saturations on Room Air at Rest =87 %  Patient Saturations on Room Air while Ambulating =83 %  Patient Saturations on3Liters of oxygen while Ambulating = 93 %     Patient needs continuous O2 at 3 L/min continuously via nasal cannula with humidifier, with gaseous portability and  conserving device  Question Answer  Comment  Length of Need Lifetime   Mode or (Route) Nasal cannula   Liters per Minute 3   Frequency Continuous (stationary and portable oxygen unit needed)   Oxygen conserving device Yes   Oxygen delivery system Gas      03/17/20 1630   03/13/20 1452  For home use only DME lightweight manual wheelchair with seat cushion  Once    Comments: Patient suffers from CVA which impairs their ability to perform daily activities like bathing and dressing in the home.  A cane, crutch or walker will not resolve  issue with performing activities of daily living. A wheelchair will allow patient to safely perform daily activities. Patient is not able to propel themselves in the home using a standard weight wheelchair due to general weakness. Patient can self propel in the lightweight wheelchair. Length of need Lifetime. Accessories: elevating leg rests (ELRs), wheel locks, extensions and anti-tippers.   03/13/20 1454          Major procedures and Radiology Reports - PLEASE review detailed and final reports for all details, in brief -      CT Head Wo Contrast  Result Date: 03/11/2020 CLINICAL DATA:  Fall from wheelchair yesterday while on blood thinners, altered mental status EXAM: CT HEAD WITHOUT CONTRAST TECHNIQUE: Contiguous axial images were obtained from the base of the skull through the vertex without intravenous contrast. COMPARISON:  11/28/2019 FINDINGS: Brain: Chronic atrophic and ischemic changes are again seen and stable. No findings to suggest acute hemorrhage, acute infarction or space-occupying mass lesion are noted. Stable old infarcts in the left MCA distribution are again seen. Vascular: No hyperdense vessel or unexpected calcification. Skull: Normal. Negative for fracture or focal lesion. Sinuses/Orbits: Mild opacification of the posterior left ethmoid air cells is again noted. Other: None IMPRESSION: Chronic atrophic and ischemic changes without acute  abnormality. Electronically Signed   By: Alcide Clever M.D.   On: 03/11/2020 15:51   DG Chest Port 1 View  Result Date: 03/11/2020 CLINICAL DATA:  79 year old female with fever and cough. Fall from wheelchair yesterday. Negative for COVID-19 last year, being retested today. EXAM: PORTABLE CHEST 1 VIEW COMPARISON:  Portable chest 11/07/2019 and earlier. FINDINGS: Portable AP semi upright view at 1432 hours. New confluent abnormal opacities in the left lung about the hilum, and at the right lung base. Evidence of superimposed right pleural effusion, including some fluid suspected in the right minor fissure. The left lung base and right upper lobe remain well pneumatized. Lower lung volumes overall. Stable mediastinal contours. Visualized tracheal air column is within normal limits. Prior cervical ACDF. No acute osseous abnormality identified. IMPRESSION: 1. Confluent new left perihilar and right lung base opacity most suspicious for multilobar pneumonia. Aspiration is also a consideration. 2. Superimposed small right pleural effusion suspected. Electronically Signed   By: Odessa Fleming M.D.   On: 03/11/2020 15:14   DG Swallowing Func-Speech Pathology  Result Date: 03/13/2020 Objective Swallowing Evaluation: Type of Study: MBS-Modified Barium Swallow Study  Patient Details Name: EMME ROSENAU MRN: [id number] Date of Birth: 1941-08-21 Today's Date: 03/13/2020 Time: SLP Start Time (ACUTE ONLY): 0901 -SLP Stop Time (ACUTE ONLY): 0922 SLP Time Calculation (min) (ACUTE ONLY): 21 min Past Medical History: Past Medical History: Diagnosis Date . Diabetes (HCC)   Type 2 x 10 yrs . Dysrhythmia   AFib . GERD (gastroesophageal reflux disease)  . Grade I diastolic dysfunction 09/03/2018 . High cholesterol  . Hypertension   x 10 yrs . Paroxysmal a-fib: Remote  hx of afib 06/15/2015 . Stroke [facility](HCC)   no deficits Past Surgical History: Past Surgical History: Procedure Laterality Date . CATARACT EXTRACTION W/PHACO Left 06/06/2016   Procedure: CATARACT EXTRACTION PHACO AND INTRAOCULAR LENS PLACEMENT (IOC);  Surgeon: Gemma PayorKerry Hunt, MD;  Location: [facility] ORS;  Service: Ophthalmology;  Laterality: Left;  CDE: 8.32 . COLONOSCOPY   . COLONOSCOPY N/A 05/08/2013  Procedure: COLONOSCOPY;  Surgeon: Malissa HippoNajeeb U Rehman, MD;  Location: [facility] ENDO SUITE;  Service: Endoscopy;  Laterality: N/A;  1015 . NECK SURGERY    x 2 for spur and disc problems HPI: Revonda HumphreyCatherine Haverstick  is a 79 y.o. female, withhistory significant for diabetes, GERD, hypertension and history of atrial fibrillation on Eliquis, history of CVA with expressive aphasia and dementia, hypertension, hyperlipidemia, patient is a resident at SNF, she presents from SNF to evaluation for increased lethargy and generalized weakness, and fall yesterday, apparently patient fell from her wheelchair yesterday, where she had multiple bruising, she is on Eliquis, as well today she was noted to be more sleepy and less interactive today, she was sent for further evaluation, especially she struck her forehead when she fell from the chair, patient is poor historian, she herself cannot provide any complaints. In the ED she was noted to have multiple bruising from her fall, and forehead and right periorbital area, CT head with no evidence of bleed or any acute findings, she was noted to be febrile 100.8, as well patient was noted to have cough, chest x-ray was significant for right lower base opacity, and she had elevated lactic acid at 2.2, her urinalysis was negative, patient received broad-spectrum antibiotic in ED, blood cultures were done and [facility] was called to admit. BSE 4/15 indicated need for MBS  Subjective: "His nanme is Holly ClicheBobby." (husband) Assessment / Plan / Recommendation CHL IP CLINICAL IMPRESSIONS 03/13/2020 Clinical Impression Pt presents with moderate sensorimotor pharyngeal dysphagia characterized by penetration of most all presentations of liquids (thin, NTL and HTL)  during the swallow and eventual silent  aspiration of (trace to moderate amounts) residuals after the swallow. Pt demonstrates good hyolaryngeal elevation however note inadequate laryngeal vestibule closure and decreased pharyngeal squeeze during the swallow consequently resulting in residuals visualized in valleculae, laryngeal vestibule and interarytenoid space which are eventually aspirated at varying levels of severity. Chin tuck and effortful swallow were attempted and both ineffective in decreasing residue, or in limiting or eliminating penetration/aspiration.  Note larger amounts of penetration/aspiration with thicker consistencies despite providing varying volumes. Penetration but no aspiration was noted with puree and solid textures; moderate residue of solids in the valleculae was cleared with a reflexive additional swallow. The smallest amount of laryngeal vestibule residue (which is eventually aspirated) was noted with tsp sips of thin liquids. Recommend D3/mech soft and upgrade to thin liquids with administration by TSP. Recommend meds to be administered whole with puree. Recommend ST to continue to follow and to f/u at d/c location to implement dysphagia therapy with laryngeal strengthening exercises. Communicated with MD who will also clarify goals of care. SLP Visit Diagnosis Dysphagia, pharyngeal phase (R13.13) Attention and concentration deficit following -- Frontal lobe and executive function deficit following -- Impact on safety and function Moderate aspiration risk   CHL IP TREATMENT RECOMMENDATION 03/13/2020 Treatment Recommendations Therapy as outlined in treatment plan below   Prognosis 03/13/2020 Prognosis for Safe Diet Advancement Fair Barriers to Reach Goals Severity of deficits Barriers/Prognosis Comment -- CHL IP DIET RECOMMENDATION 03/13/2020 SLP Diet Recommendations Dysphagia 3 (Mech soft) solids;Thin liquid Liquid Administration  via Spoon Medication Administration Whole meds with puree Compensations Slow rate;Small sips/bites  Postural Changes Remain semi-upright after after feeds/meals (Comment);Seated upright at 90 degrees   CHL IP OTHER RECOMMENDATIONS 03/13/2020 Recommended Consults -- Oral Care Recommendations Oral care BID Other Recommendations Clarify dietary restrictions   CHL IP FOLLOW UP RECOMMENDATIONS 03/13/2020 Follow up Recommendations Skilled Nursing facility   [facility] IP FREQUENCY AND DURATION 03/13/2020 Speech Therapy Frequency (ACUTE ONLY) min 2x/week Treatment Duration 1 week      CHL IP ORAL PHASE 03/13/2020 Oral Phase WFL Oral - Pudding Teaspoon -- Oral - Pudding Cup -- Oral - Honey Teaspoon -- Oral - Honey Cup -- Oral - Nectar Teaspoon -- Oral - Nectar Cup -- Oral - Nectar Straw -- Oral - Thin Teaspoon -- Oral - Thin Cup -- Oral - Thin Straw -- Oral - Puree -- Oral - Mech Soft -- Oral - Regular -- Oral - Multi-Consistency -- Oral - Pill -- Oral Phase - Comment --  CHL IP PHARYNGEAL PHASE 03/13/2020 Pharyngeal Phase -- Pharyngeal- Pudding Teaspoon -- Pharyngeal -- Pharyngeal- Pudding Cup -- Pharyngeal -- Pharyngeal- Honey Teaspoon -- Pharyngeal -- Pharyngeal- Honey Cup -- Pharyngeal Material enters airway, passes BELOW cords without attempt by patient to eject out (silent aspiration) Pharyngeal- Nectar Teaspoon -- Pharyngeal -- Pharyngeal- Nectar Cup -- Pharyngeal Material enters airway, passes BELOW cords without attempt by patient to eject out (silent aspiration) Pharyngeal- Nectar Straw -- Pharyngeal -- Pharyngeal- Thin Teaspoon -- Pharyngeal Material enters airway, passes BELOW cords without attempt by patient to eject out (silent aspiration) Pharyngeal- Thin Cup -- Pharyngeal Material enters airway, passes BELOW cords without attempt by patient to eject out (silent aspiration) Pharyngeal- Thin Straw -- Pharyngeal Material enters airway, passes BELOW cords without attempt by patient to eject out (silent aspiration) Pharyngeal- Puree -- Pharyngeal -- Pharyngeal- Mechanical Soft -- Pharyngeal -- Pharyngeal- Regular --  Pharyngeal -- Pharyngeal- Multi-consistency -- Pharyngeal -- Pharyngeal- Pill -- Pharyngeal -- Pharyngeal Comment --  No flowsheet data found. Amelia H. Roddie Mc, CCC-SLP Speech Language Pathologist Wende Bushy 03/13/2020, 12:16 PM               Micro Results    Recent Results (from the past 240 hour(s))  Urine culture     Status: Abnormal   Collection Time: 03/11/20  1:49 PM   Specimen: Urine, Catheterized  Result Value Ref Range Status   Specimen Description   Final    URINE, CATHETERIZED Performed at [facility], [address], [place], [place] [address]    Special Requests   Final    NONE Performed at [facility] ([place]), [address], [place], [place] [address]    Culture >=100,000 COLONIES/mL KLEBSIELLA PNEUMONIAE (A)  Final   Report Status 03/13/2020 FINAL  Final   Organism ID, Bacteria KLEBSIELLA PNEUMONIAE (A)  Final      Susceptibility   Klebsiella pneumoniae - MIC*    AMPICILLIN RESISTANT Resistant     CEFAZOLIN <=4 SENSITIVE Sensitive     CEFTRIAXONE <=1 SENSITIVE Sensitive     CIPROFLOXACIN <=0.25 SENSITIVE Sensitive     GENTAMICIN <=1 SENSITIVE Sensitive     IMIPENEM 1 SENSITIVE Sensitive     NITROFURANTOIN 64 INTERMEDIATE Intermediate     TRIMETH/SULFA >=320 RESISTANT Resistant     AMPICILLIN/SULBACTAM <=2 SENSITIVE Sensitive     PIP/TAZO <=4 SENSITIVE Sensitive     * >=100,000 COLONIES/mL KLEBSIELLA PNEUMONIAE  Blood Culture (routine x 2)     Status: None   Collection Time:  03/11/20  2:57 PM   Specimen: Right Antecubital; Blood  Result Value Ref Range Status   Specimen Description RIGHT ANTECUBITAL  Final   Special Requests   Final    BOTTLES DRAWN AEROBIC ONLY Blood Culture results may not be optimal due to an inadequate volume of blood received in culture bottles   Culture   Final    NO GROWTH 5 DAYS Performed at [facility], [address], [place], [place] [address]    Report Status 03/16/2020 FINAL  Final  Blood Culture (routine x 2)      Status: None   Collection Time: 03/11/20  2:57 PM   Specimen: BLOOD RIGHT HAND  Result Value Ref Range Status   Specimen Description BLOOD RIGHT HAND  Final   Special Requests   Final    BOTTLES DRAWN AEROBIC AND ANAEROBIC Blood Culture results may not be optimal due to an inadequate volume of blood received in culture bottles   Culture   Final    NO GROWTH 5 DAYS Performed at [facility], [address], [place], [place] [address]    Report Status 03/16/2020 FINAL  Final  Respiratory Panel by RT PCR (Flu A&B, Covid) - Nasopharyngeal Swab     Status: None   Collection Time: 03/11/20  4:30 PM   Specimen: Nasopharyngeal Swab  Result Value Ref Range Status   SARS Coronavirus 2 by RT PCR NEGATIVE NEGATIVE Final    Comment: (NOTE) SARS-CoV-2 target nucleic acids are NOT DETECTED. The SARS-CoV-2 RNA is generally detectable in upper respiratoy specimens during the acute phase of infection. The lowest concentration of SARS-CoV-2 viral copies this assay can detect is 131 copies/mL. A negative result does not preclude SARS-Cov-2 infection and should not be used as the sole basis for treatment or other patient management decisions. A negative result may occur with  improper specimen collection/handling, submission of specimen other than nasopharyngeal swab, presence of viral mutation(s) within the areas targeted by this assay, and inadequate number of viral copies (<131 copies/mL). A negative result must be combined with clinical observations, patient history, and epidemiological information. The expected result is Negative. Fact Sheet for Patients:  https://www.Sinor.com/ Fact Sheet for Healthcare Providers:  https://www.young.biz/ This test is not yet ap proved or cleared by the [place] FDA and  has been authorized for detection and/or diagnosis of SARS-CoV-2 by FDA under an Emergency Use Authorization (EUA). This EUA will remain  in effect  (meaning this test can be used) for the duration of the COVID-19 declaration under Section 564(b)(1) of the Act, 21 U.S.C. section 360bbb-3(b)(1), unless the authorization is terminated or revoked sooner.    Influenza A by PCR NEGATIVE NEGATIVE Final   Influenza B by PCR NEGATIVE NEGATIVE Final    Comment: (NOTE) The Xpert Xpress SARS-CoV-2/FLU/RSV assay is intended as an aid in  the diagnosis of influenza from Nasopharyngeal swab specimens and  should not be used as a sole basis for treatment. Nasal washings and  aspirates are unacceptable for Xpert Xpress SARS-CoV-2/FLU/RSV  testing. Fact Sheet for Patients: https://www.Mickler.com/ Fact Sheet for Healthcare Providers: https://www.young.biz/ This test is not yet approved or cleared by the [place] FDA and  has been authorized for detection and/or diagnosis of SARS-CoV-2 by  FDA under an Emergency Use Authorization (EUA). This EUA will remain  in effect (meaning this test can be used) for the duration of the  Covid-19 declaration under Section 564(b)(1) of the Act, 21  U.S.C. section 360bbb-3(b)(1), unless the authorization is  terminated or revoked. Performed at [facility], [address], [place], [place] [address]   MRSA PCR Screening     Status: Abnormal   Collection Time: 03/11/20  4:43 PM   Specimen: Nasal Mucosa; Nasopharyngeal  Result Value Ref Range Status   MRSA by PCR POSITIVE (A) NEGATIVE Final    Comment:        The GeneXpert MRSA Assay (FDA approved for NASAL specimens only), is one component of a comprehensive MRSA colonization surveillance program. It is not intended to diagnose MRSA infection nor to guide or monitor treatment for MRSA infections. RESULT CALLED TO, READ BACK BY AND VERIFIED WITH: KILMER,T ON 03/11/20 AT 2155 BY LOY,C Performed at [facility], [address], [place], [place] [address]        Today   Subjective    Kinzi Frediani today has  no new complaints -Hypoxia persist, requiring 2 L of oxygen via nasal cannula         Patient has been seen and examined prior to discharge   Objective   Blood pressure (!) 153/90, pulse (!) 101, temperature 97.6 F (36.4 C), temperature source Oral, resp. rate 16, height 5\' 5"  (1.651 m), weight 57.2 kg, SpO2 98 %.   Intake/Output Summary (Last 24 hours) at 03/18/2020 1110 Last data filed at 03/18/2020 0500 Gross per 24 hour  Intake 50 ml  Output 350 ml  Net -300 ml    Exam General exam: Alert, awake, oriented x 3 Nose- Dewey 3L/min Respiratory system:  Improving air movement, no wheezing cardiovascular system: Irregular. No murmurs, rubs, gallops. Gastrointestinal system: Abdomen is nondistended, soft and nontender.  Normal bowel sounds heard. Central nervous system: Alert and oriented.  Patient with generalized weakness, no focal neurological deficits. Extremities: No C/C/E, +pedal pulses Skin: No rashes, lesions or ulcers Psychiatry: Calm, pleasant.    Data Review   CBC w Diff:  Lab Results  Component Value Date   WBC 6.0 03/18/2020   HGB 11.0 (L) 03/18/2020   HCT 35.9 (L) 03/18/2020   PLT 297 03/18/2020   LYMPHOPCT 20 03/11/2020   MONOPCT 5 03/11/2020   EOSPCT 1 03/11/2020   BASOPCT 0 03/11/2020    CMP:  Lab Results  Component Value Date   NA 138 03/17/2020   K 2.9 (L) 03/17/2020   CL 101 03/17/2020   CO2 28 03/17/2020   BUN 10 03/17/2020   CREATININE 0.71 03/17/2020   PROT 5.5 (L) 03/11/2020   ALBUMIN 2.7 (L) 03/11/2020   BILITOT 0.8 03/11/2020   ALKPHOS 121 03/11/2020   AST 20 03/11/2020   ALT 11 03/11/2020  .   Total Discharge time is about 33 minutes  03/13/2020 M.D on 03/18/2020 at 11:10 AM  Go to [email or web address] -  for contact info  [facility] - Office  [phone]

## 2020-03-17 NOTE — Progress Notes (Signed)
     SATURATION QUALIFICATIONS: (Thisnote is usedto comply with regulatory documentation for home oxygen)  Patient Saturations on Room Air at Rest =87 %  Patient Saturations on Room Air while Ambulating =83 %  Patient Saturations on3Liters of oxygen while Ambulating = 93 %    Patient needs continuous O2 at 3 L/min continuously via nasal cannula with humidifier, with gaseous portability and conserving device    Dx--- CHF  Shon Hale, MD

## 2020-03-17 NOTE — Progress Notes (Signed)
Palliative: Holly Sparks is resting quietly in bed.  She appears acutely/chronically ill and quite frail.  She does not respond when I gently call her name.  I am not sure that she can make her needs known.  Her sister is at bedside.  Holly Sparks sister wakes her by calling her name loudly.  She shares that Holly Sparks is sometimes confused for a little while after waking.  Holly Sparks is able to answer a few questions, but is difficult to understand.  I share that she looks to be having work of breathing, and her sister replies that she just ate.  Holly Sparks denies concerns or pain at this time.  Call to son, Holly Sparks.  We talked in detail about her acute health problems.  Holly Sparks shares that his mother went into [facility] in January and was transitioned to [facility] February 10 of this year.  He shares that his goal is for her to return to [facility] because her spouse is 57 and cannot care for her in their home.  We talked in detail about aspiration pneumonia as a chronic and terminal illness.  We talked about signs and symptoms of aspiration along with silent aspiration.  We talked about dietary changes and speech therapy.  We talked about the benefits of hospice care for "treat the treatable" care at [facility].  Holly Sparks states that he and family are agreeable to hospice care.  We talked about preferred place of death.  Holly Sparks states that they have not discussed preferred place of death, but would consider bringing Holly Sparks back home for hospice care if/when she had only a few days left to live.  We talked about service provider, and Holly Sparks mentions hosp[facility] of [facility].  Holly Sparks had a working relationship with [facility], but transition of care team shares that Holly Sparks is now allowing family to choose their hospice provider.  Conference with attending, bedside nursing staff, transition of care team related to patient condition, needs, goals of care,  disposition to ALF with hospice care.  Plan:   Return to [facility] with the benefits of hospice care.  Considering transition to comfort care when she gets sick again.     40 minutes Holly Carmel, NP Palliative Medicine Team Team Phone # [phone] Greater than 50% of this time was spent counseling and coordinating care related to the above assessment and plan.

## 2020-03-17 NOTE — Discharge Instructions (Signed)
1)Leg wounds--- multiple bruises/abrasions to bilateral upper and lower extremities and face greater on the right side--- POA -Wound care specialist consult appreciated -Wound dressings placed once to twice daily -cleanse wounds  with soap and water, rinse and pat day, followed by application of a thin layer of a humectant, Eucerin. Open lesions will be covered with the moisture retentive, nonadherent, antimicrobial, xeroform and topped with a dry dressing. A Kerlix roll gauze wrapped from just below toes to just below knees will secure dressings and protect the LEs from inadvertent trauma. Bilateral feet will be placed into pressure redistribution heel boots. A pressure redistribution chair pad is provided for her for when OOB in the chair at her facility post discharge. A sacral prophylactic foam dressing to the sacrum is to be placed while in house. 2) family request that they focus should be on comfort care and dignity 3)-please avoid transport to the hospital without first talking to [facility]--- [phone]    4)-- Discharge back to [facility] with hospice/palliative care services--- family will consider transition to full comfort care in the near future if condition declines 5)Patient is high risk for Aspiration ----dysphagia 3 (Mech soft) solids;Thin liquid 6) give supplemental oxygen at 3 L/min continuously via nasal cannula

## 2020-03-18 DIAGNOSIS — W19XXXA Unspecified fall, initial encounter: Secondary | ICD-10-CM

## 2020-03-18 LAB — CBC
HCT: 35.9 % — ABNORMAL LOW (ref 36.0–46.0)
Hemoglobin: 11 g/dL — ABNORMAL LOW (ref 12.0–15.0)
MCH: 30.4 pg (ref 26.0–34.0)
MCHC: 30.6 g/dL (ref 30.0–36.0)
MCV: 99.2 fL (ref 80.0–100.0)
Platelets: 297 10*3/uL (ref 150–400)
RBC: 3.62 MIL/uL — ABNORMAL LOW (ref 3.87–5.11)
RDW: 17.2 % — ABNORMAL HIGH (ref 11.5–15.5)
WBC: 6 10*3/uL (ref 4.0–10.5)
nRBC: 0 % (ref 0.0–0.2)

## 2020-03-18 LAB — GLUCOSE, CAPILLARY
Glucose-Capillary: 67 mg/dL — ABNORMAL LOW (ref 70–99)
Glucose-Capillary: 86 mg/dL (ref 70–99)
Glucose-Capillary: 75 mg/dL (ref 70–99)
Glucose-Capillary: 93 mg/dL (ref 70–99)

## 2020-03-18 MED ORDER — LORAZEPAM 0.5 MG PO TABS: 0.5000 mg | ORAL_TABLET | Freq: Three times a day (TID) | ORAL | 0 refills | Status: AC | PRN

## 2020-03-18 NOTE — Care Management Important Message (Signed)
Important Message  Patient Details  Name: Holly Sparks MRN: [id number] Date of Birth: 1941-01-21   Medicare Important Message Given:  Yes     Corey Harold 03/18/2020, 4:16 PM

## 2020-03-18 NOTE — Progress Notes (Signed)
.  Patient seen and evaluated, chart reviewed, please see EMR for updated orders. Please see full Dc summary dictated  for same date of service.    Discharge back to ALF with hospice  and palliative care services ---with plans to possibly transition to full comfort care in the near future if condition declines further   Shon Hale, MD

## 2020-03-18 NOTE — NC FL2 (Signed)
[place] MEDICAID FL2 LEVEL OF CARE SCREENING TOOL     IDENTIFICATION  Patient Name: Holly Sparks Birthdate: 1941-11-19 Sex: female Admission Date (Current Location): 03/11/2020  [facility] and [place] Number:  [employer] and Address:  [facility],  [address] [place] [address]      Provider Number: [id number]  Attending Physician Name and Address:  Shon Hale, MD  Relative Name and Phone Number:  Vivia [place], son, [phone]    Current Level of Care: Hospital Recommended Level of Care: Assisted Living Facility, Memory Care Prior Approval Number:    Date Approved/Denied:   PASRR Number:    Discharge Plan: Domiciliary (Rest home)    Current Diagnoses: Patient Active Problem List   Diagnosis Date Noted  . Head injury   . Pneumonia of both lungs due to infectious organism   . Goals of care, counseling/discussion   . Palliative care by specialist   . Encounter for hospice care discussion   . Sepsis (HCC) 03/11/2020  . Adult failure to thrive 11/28/2019  . Atrial fibrillation with RVR (HCC)   . Hypokalemia   . Right arm weakness   . TIA (transient ischemic attack) 11/07/2019  . 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 Grade I diastolic dysfunction 09/03/2018  . Hypomagnesemia 09/03/2018  . Paroxysmal atrial fibrillation (HCC) 08/31/2015  . Chronic anticoagulation 08/31/2015  . Type 2 diabetes mellitus with circulatory disorder (HCC) 08/31/2015  . 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 Cerebral infarction due to embolism of left middle cerebral artery (HCC)   . Stroke-like symptoms 06/13/2015  . HLD (hyperlipidemia) 06/13/2015  . DDD (degenerative disc disease), cervical 06/13/2015  . Aphasia 06/13/2015  . Expressive aphasia   . Cerebral infarction due to unspecified mechanism   . Type 2 diabetes mellitus without complication (HCC)   . IBS (irritable bowel syndrome)  04/24/2013  . Diabetes (HCC) 04/24/2013  . Essential hypertension, benign 04/24/2013    Orientation RESPIRATION BLADDER Height & Weight     Self  O2(3L) Incontinent Weight: 126 lb 1.7 oz (57.2 kg) Height:  5\' 5"  (165.1 cm)  BEHAVIORAL SYMPTOMS/MOOD NEUROLOGICAL BOWEL NUTRITION STATUS      Incontinent Diet(DYS 3 (Mech Soft) Thin liquids)  AMBULATORY STATUS COMMUNICATION OF NEEDS Skin   Extensive Assist Verbally Other (Comment)(non pressure, foot wound, left anterior/open blister. non pressure, foot, right anterior/open blister)                       Personal Care Assistance Level of Assistance  Bathing, Feeding, Dressing Bathing Assistance: Limited assistance Feeding assistance: Limited assistance       Functional Limitations Info  Sight, Hearing, Speech Sight Info: Adequate Hearing Info: Adequate Speech Info: Adequate    SPECIAL CARE FACTORS FREQUENCY  PT (By licensed PT), Speech therapy     PT Frequency: 3x/week       Speech Therapy Frequency: 3x/week.      Contractures Contractures Info: Not present    Additional Factors Info  Code Status, Allergies Code Status Info: DNR Allergies Info: NKA           Current Medications (03/18/2020):  This is the current hospital active medication list Current Facility-Administered Medications  Medication Dose Route Frequency Provider Last Rate Last Admin  . 0.9 %  sodium chloride infusion   Intravenous Continuous 03/20/2020, MD 20 mL/hr at 03/15/20 0456 New Bag at 03/15/20 0456  .  acetaminophen (TYLENOL) tablet 650 mg  650 mg Oral Q6H PRN Elgergawy, Silver Huguenin, MD   650 mg at 03/14/20 2228   Or  . acetaminophen (TYLENOL) suppository 650 mg  650 mg Rectal Q6H PRN Elgergawy, Silver Huguenin, MD      . albuterol (PROVENTIL) (2.5 MG/3ML) 0.083% nebulizer solution 2.5 mg  2.5 mg Nebulization Q2H PRN Elgergawy, Silver Huguenin, MD      . apixaban (ELIQUIS) tablet 5 mg  5 mg Oral BID Kathie Dike, MD   5 mg at 03/18/20 1100  .  Chlorhexidine Gluconate Cloth 2 % PADS 6 each  6 each Topical Q0600 Kathie Dike, MD   6 each at 03/18/20 0600  . diltiazem (CARDIZEM CD) 24 hr capsule 120 mg  120 mg Oral Daily Emokpae, Courage, MD   120 mg at 03/18/20 1100  . hydrocerin (EUCERIN) cream   Topical Daily Roxan Hockey, MD   Given at 03/18/20 1101  . insulin aspart (novoLOG) injection 0-6 Units  0-6 Units Subcutaneous Q4H Elgergawy, Silver Huguenin, MD   2 Units at 03/17/20 2110  . metoprolol tartrate (LOPRESSOR) tablet 12.5 mg  12.5 mg Oral BID Emokpae, Courage, MD   12.5 mg at 03/18/20 1100  . mupirocin ointment (BACTROBAN) 2 % 1 application  1 application Nasal BID Kathie Dike, MD   1 application at 95/62/13 1101  . piperacillin-tazobactam (ZOSYN) IVPB 3.375 g  3.375 g Intravenous Q8H Elgergawy, Silver Huguenin, MD 12.5 mL/hr at 03/18/20 0536 3.375 g at 03/18/20 0536     Discharge Medications: TAKE these medications       acetaminophen 325 MG tablet Commonly known as: TYLENOL Take 2 tablets (650 mg total) by mouth every 6 (six) hours as needed for mild pain, fever or headache (or Fever >/= 101). What changed: reasons to take this   amitriptyline 25 MG tablet Commonly known as: ELAVIL Take 12.5 mg by mouth at bedtime.   diltiazem 120 MG 24 hr capsule Commonly known as: Cardizem CD Take 1 capsule (120 mg total) by mouth daily.   Doxepin HCl 3 MG Tabs Take 3 mg by mouth at bedtime.   feeding supplement (ENSURE COMPLETE) Liqd Take 237 mLs by mouth 2 (two) times daily between meals.   hydrocerin Crea Apply 1 application topically daily.   LORazepam 0.5 MG tablet Commonly known as: Ativan Take 1 tablet (0.5 mg total) by mouth every 8 (eight) hours as needed for anxiety or sleep.   metoprolol tartrate 25 MG tablet Commonly known as: LOPRESSOR Take 12.5 mg by mouth 2 (two) times daily.   ondansetron 4 MG disintegrating tablet Commonly known as: Zofran ODT Take 1 tablet (4 mg total) by mouth every 8 (eight)  hours as needed for nausea or vomiting.   oxyCODONE 5 MG immediate release tablet Commonly known as: Roxicodone Take 1 tablet (5 mg total) by mouth every 4 (four) hours as needed for moderate pain or severe pain.      Relevant Imaging Results:  Relevant Lab Results:   Additional Information SSN: [id number] Clydene Pugh, LCSW

## 2020-03-18 NOTE — Progress Notes (Signed)
Palliative: Mrs. Yearwood is resting quietly in bed.  She appears acutely/chronically ill and frail.  I am not sure that she is able to make her basic needs known.  There is no family at bedside at this time.  The goal for Mrs. Amara remains for her to return to her residential SNF, [facility] with the benefit of hospice for "treat the treatable" care. Family is considering the option to transition to comfort care when she gets sick again.  I encourage family to work closely with hospice.     DNR goldenrod form, completed.   Conference with attending, bedside nursing staff transition of care team related to patient condition, needs, goals of care.  Plan: Return to residential SNF, with the benefits of hospice.  Considering transitioning to comfort care when she gets sick again.  25 minutes Lillia Carmel, NP Palliative Medicine Team Team Phone # [phone] Greater than 50% of this time was spent counseling and coordinating care related to the above assessment and plan.

## 2020-04-28 DEATH — deceased

## 2020-06-20 IMAGING — US US CAROTID DUPLEX BILAT
1 series · 13 of 24 positions shown · non-contrast
Comparison: None.

CLINICAL DATA: TIA.  History of hypertension and diabetes.

EXAM:
BILATERAL CAROTID DUPLEX ULTRASOUND
TECHNIQUE: Gray scale imaging, color Doppler and duplex ultrasound were
performed of bilateral carotid and vertebral arteries in the neck.

[Series 1: us carotid duplex bilat · 0.06mm/px · 13 of 100 slices shown]
[im 1/100]
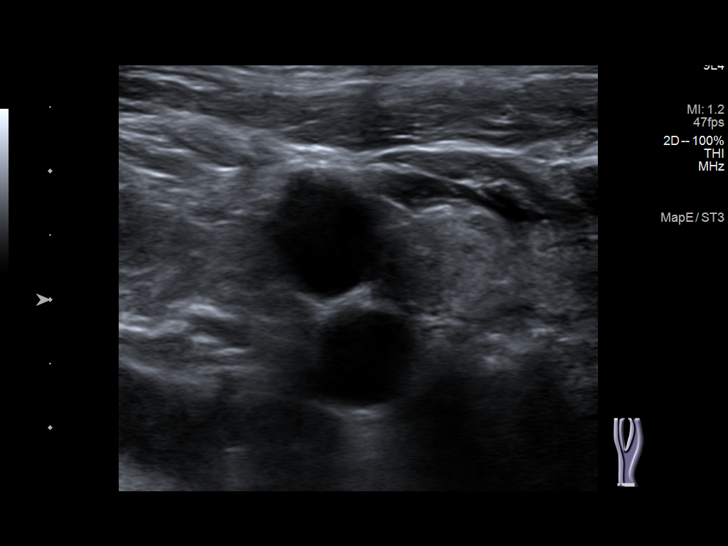
[im 9/100]
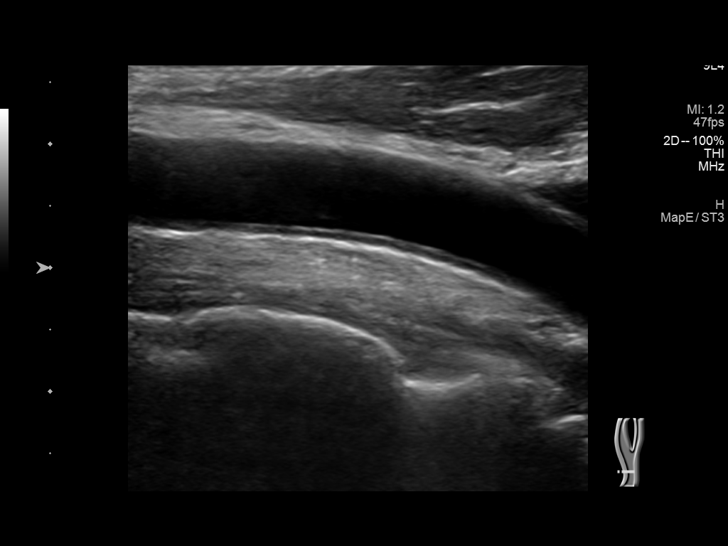
[im 18/100]
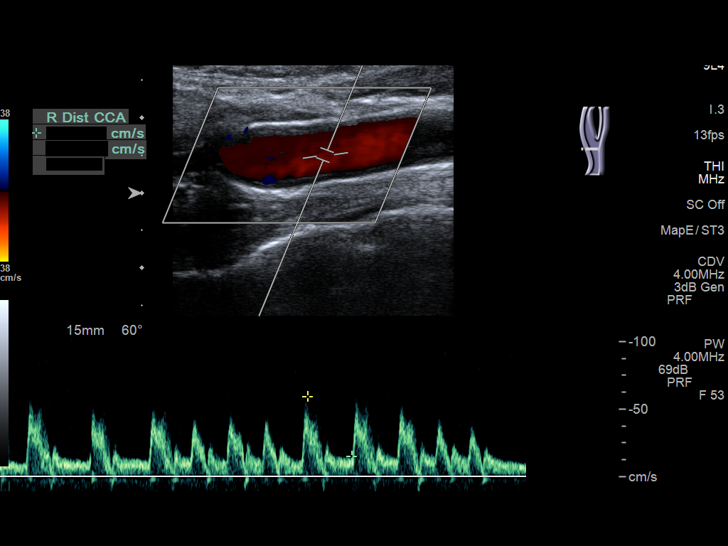
[im 26/100]
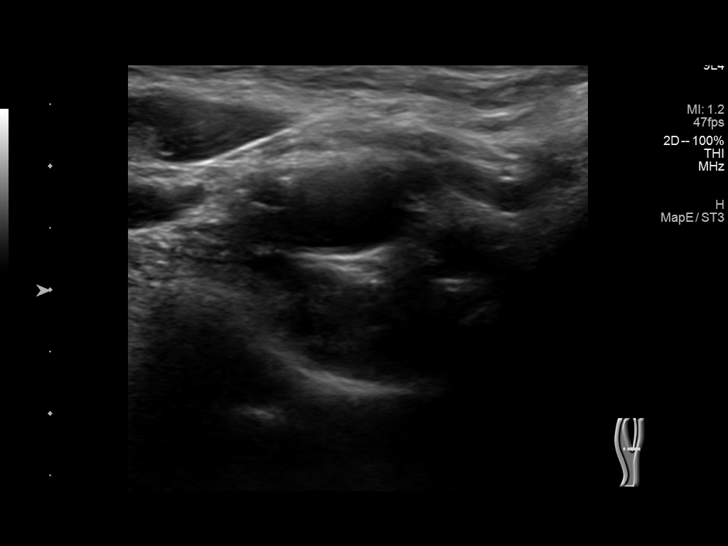
[im 35/100]
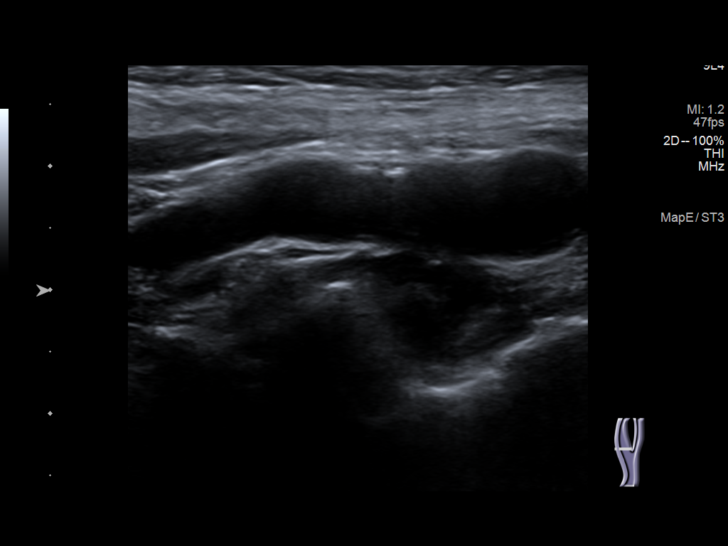
[im 44/100]
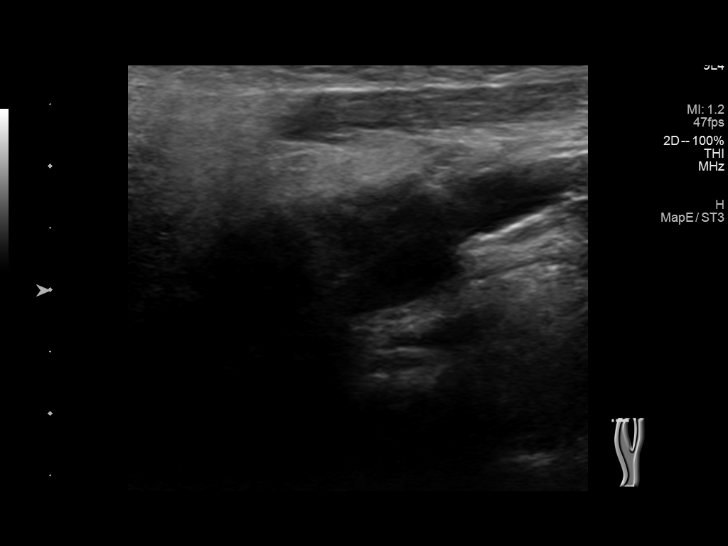
[im 52/100]
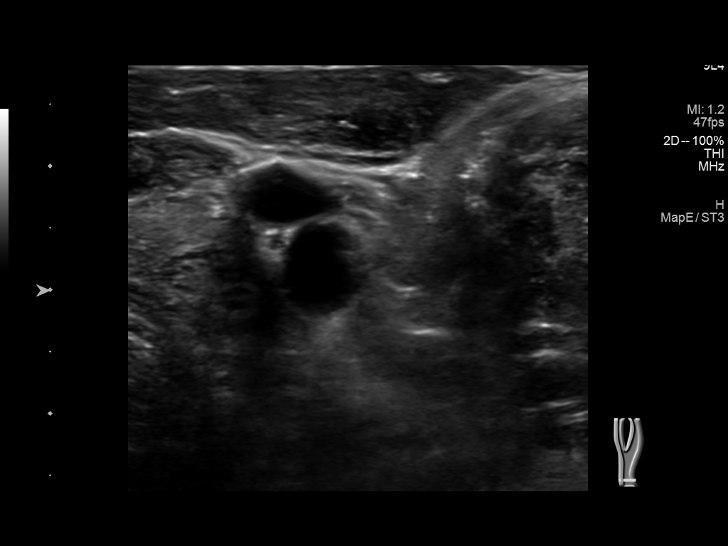
[im 56/100]
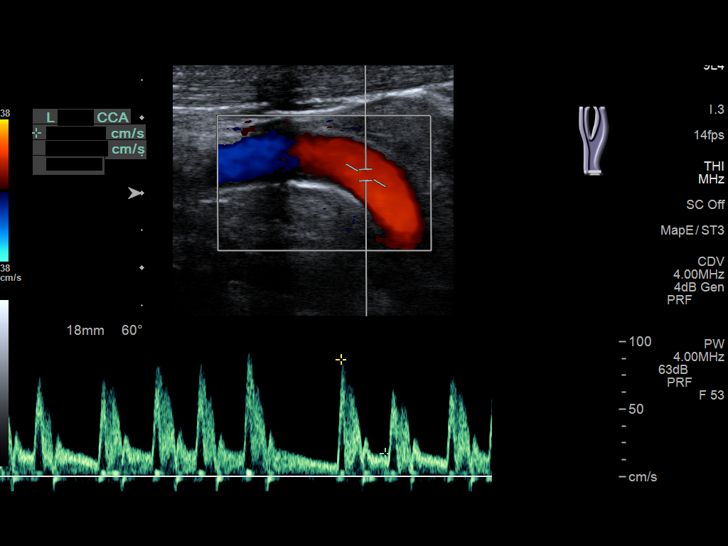
[im 65/100]
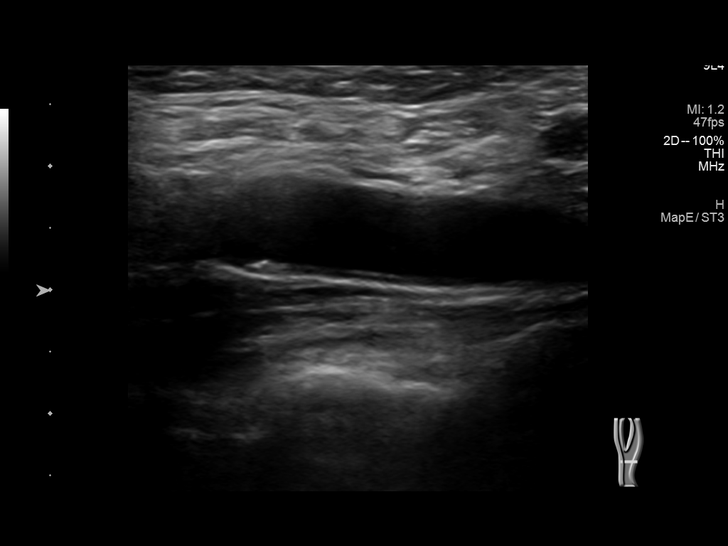
[im 74/100]
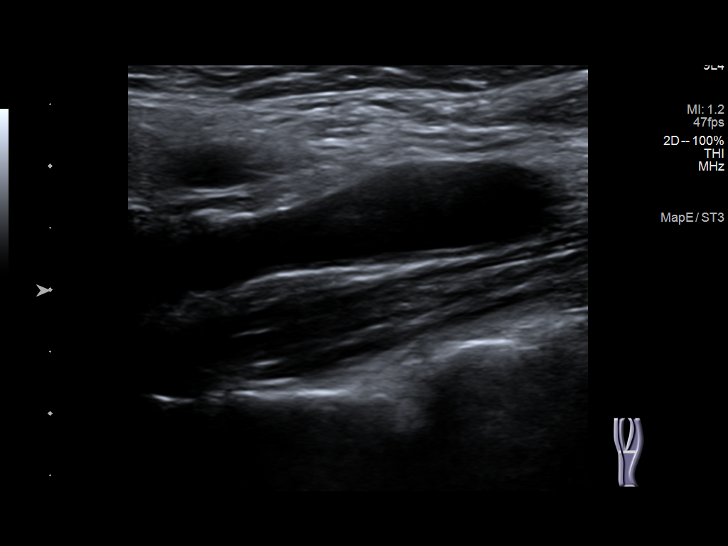
[im 82/100]
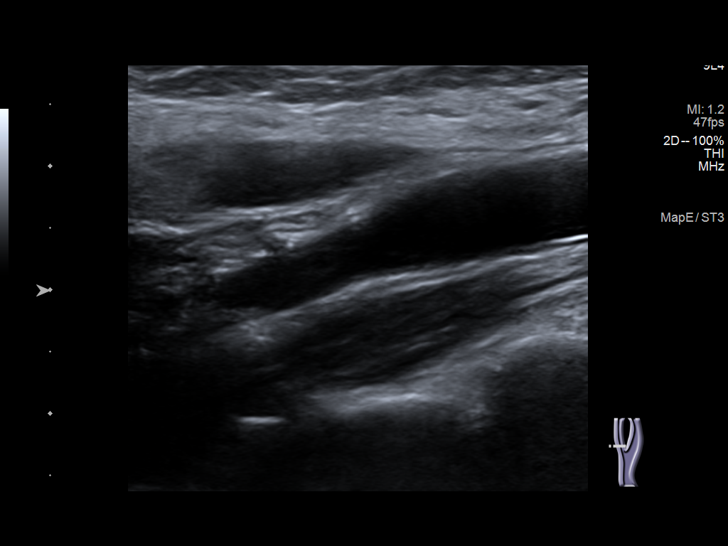
[im 91/100]
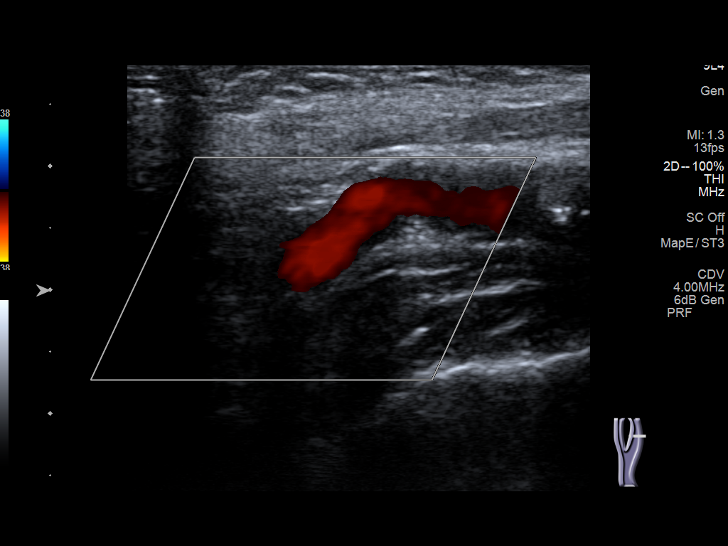
[im 100/100]
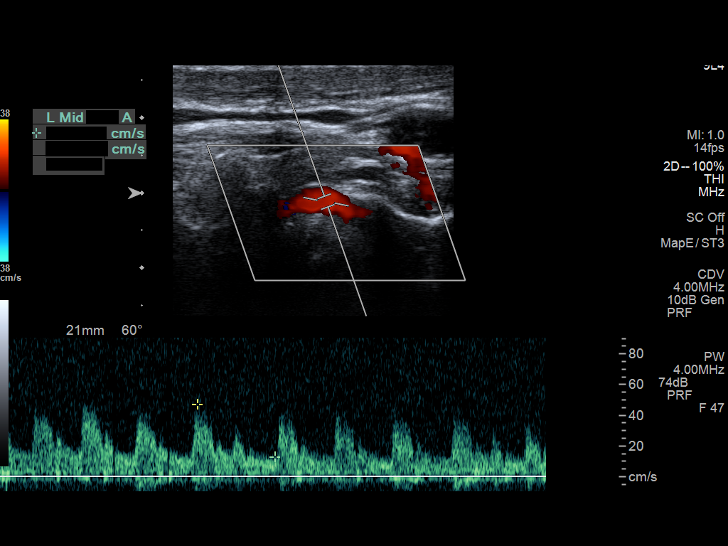

[13 of 24 positions shown; findings below may reference images not displayed]

FINDINGS: Criteria: Quantification of carotid stenosis is based on velocity
parameters that correlate the residual internal carotid diameter
with NASCET-based stenosis levels, using the diameter of the distal
internal carotid lumen as the denominator for stenosis measurement.

The following velocity measurements were obtained:

RIGHT

ICA: 75/22 cm/sec

CCA: 70/11 cm/sec

SYSTOLIC ICA/CCA RATIO:

ECA: 82 cm/sec

LEFT

ICA: 83/23 cm/sec

CCA: 69/11 cm/sec

SYSTOLIC ICA/CCA RATIO:

ECA: 79 cm/sec

RIGHT CAROTID ARTERY: There is a minimal amount of intimal wall
thickening seen throughout the right carotid artery. There is a
minimal amount of eccentric mixed echogenic plaque within the right
carotid bulb, extending to involve the origin and proximal aspects
of the right internal carotid artery (image 35), not resulting in
elevated peak systolic velocities within the interrogated course the
right internal carotid artery to suggest a hemodynamically
significant stenosis.

RIGHT VERTEBRAL ARTERY:  Antegrade flow

LEFT CAROTID ARTERY: There is a minimal amount of intimal thickening
seen throughout the left common carotid artery. There is a minimal
amount of eccentric mixed echogenic plaque within the left carotid
bulb (image 71 and 73), extending to involve the origin and proximal
aspects of the left internal carotid artery (image 87), not
resulting in elevated peak systolic velocities within the
interrogated course the left internal carotid artery to suggest a
hemodynamically significant stenosis.

LEFT VERTEBRAL ARTERY:  Antegrade flow
IMPRESSION: Minimal amount of bilateral atherosclerotic plaque, left
subjectively greater than right, not resulting in a hemodynamically
significant stenosis within either internal carotid artery.

## 2020-06-20 IMAGING — MR MR HEAD W/O CM
8 of 10 series · 35 of 48 positions shown · non-contrast
Comparison: Head CT same day

CLINICAL DATA: Right arm weakness beginning today. Fell last week.

EXAM:
MRI HEAD WITHOUT CONTRAST
TECHNIQUE: Multiplanar, multiecho pulse sequences of the brain and surrounding
structures were obtained without intravenous contrast.

[Series 2: T1 · sagittal · 5.0mm · 0.40mm/px · 2 of 20 slices shown (1 of 2)]
[im 1/20]
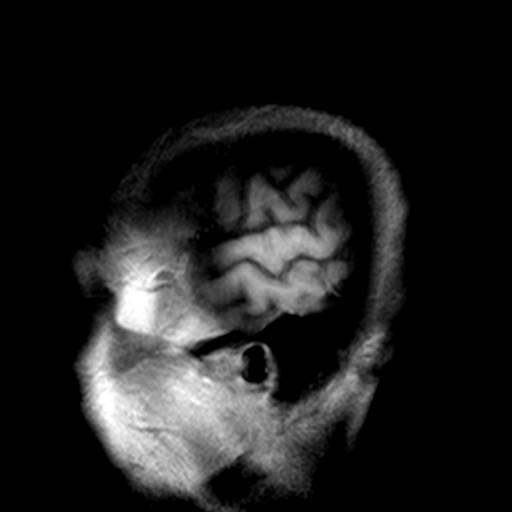
[im 20/20]
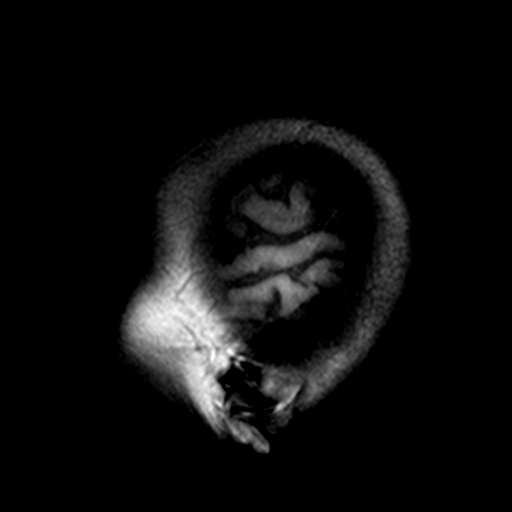

[Series 3: DWI · axial · 3.0mm · 0.71mm/px · z∈[-124,+37]mm · 7 of 55 slices shown (1 of 2)]
[im 1/55]
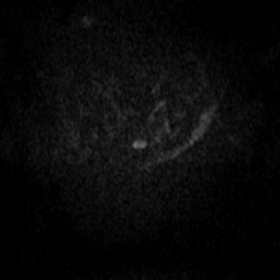
[im 10/55]
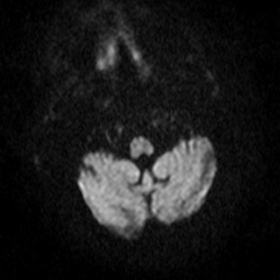
[im 19/55]
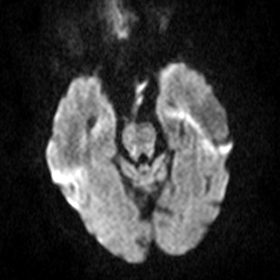
[im 28/55]
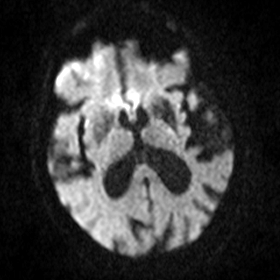
[im 37/55]
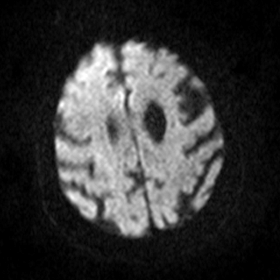
[im 46/55]
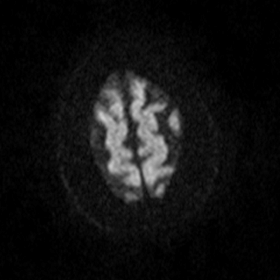
[im 55/55]
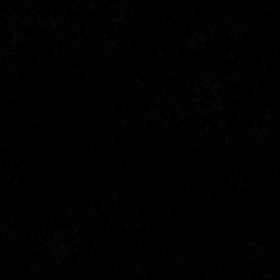

[Series 5: DWI · coronal · 5.0mm · 0.48mm/px · 4 of 34 slices shown (2 of 2)]
[im 1/34]
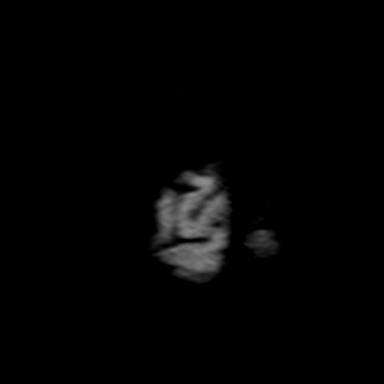
[im 12/34]
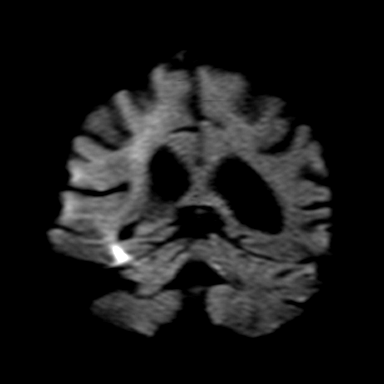
[im 23/34]
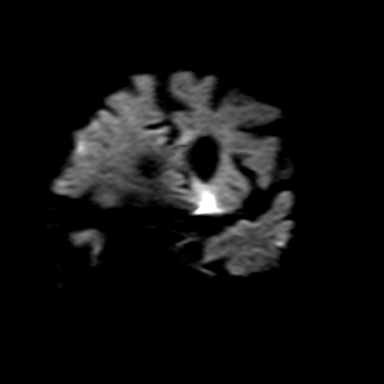
[im 34/34]
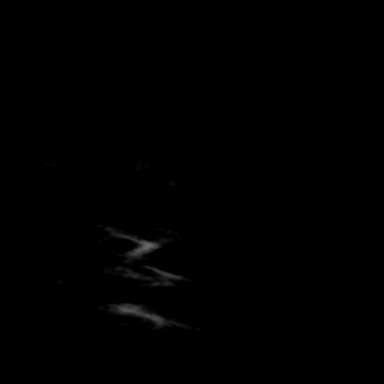

[Series 7: T2 · axial · 5.0mm · 0.65mm/px · z∈[-115,+28]mm · 3 of 23 slices shown (1 of 3)]
[im 1/23]
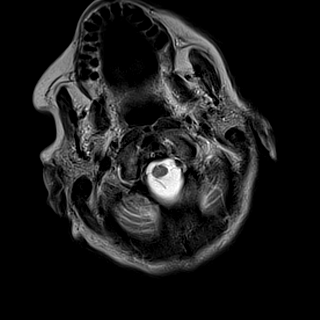
[im 12/23]
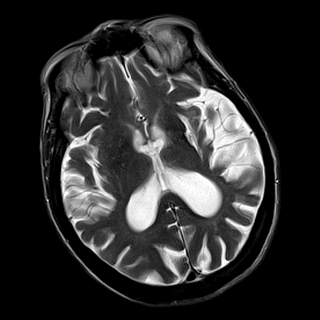
[im 23/23]
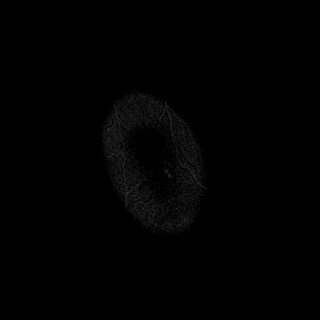

[Series 8: T2 · axial · 5.0mm · 0.45mm/px · z∈[-109,+20]mm · 3 of 21 slices shown (2 of 3)]
[im 1/21]
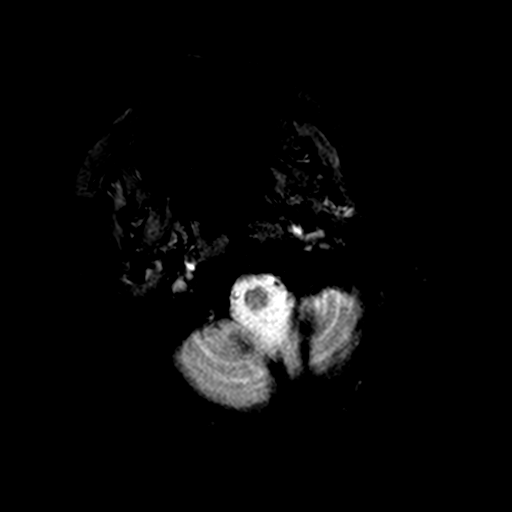
[im 11/21]
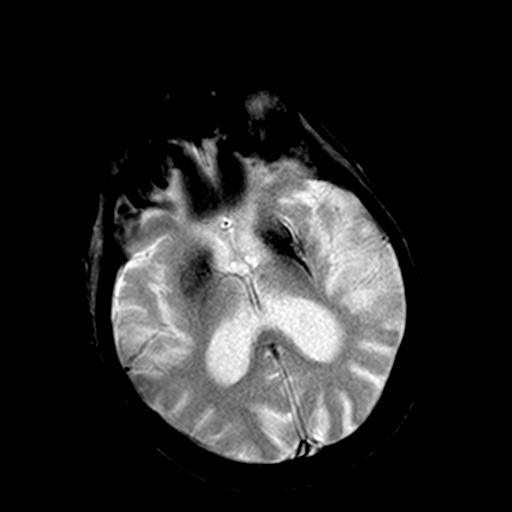
[im 21/21]
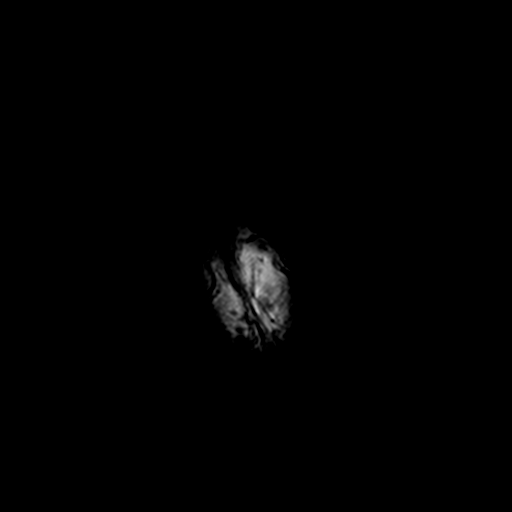

[Series 9: FLAIR · axial · 3.0mm · 0.94mm/px · z∈[-114,+24]mm · 6 of 47 slices shown]
[im 1/47]
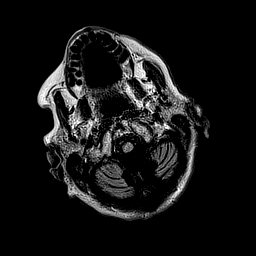
[im 10/47]
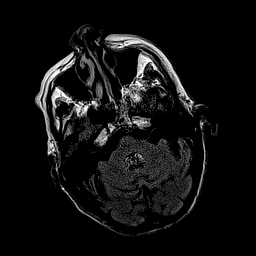
[im 19/47]
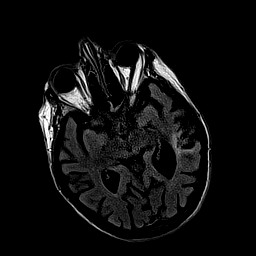
[im 28/47]
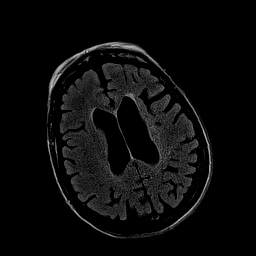
[im 37/47]
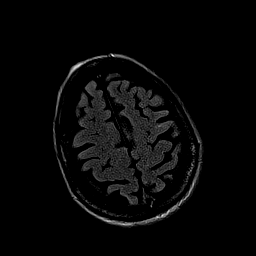
[im 47/47]
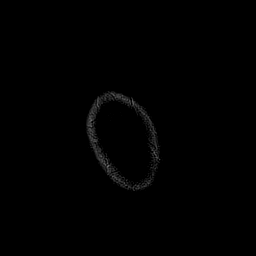

[Series 10: T1 · axial · 2.0mm · 0.44mm/px · z∈[-131,+16]mm · 7 of 85 slices shown (2 of 2)]
[im 1/85]
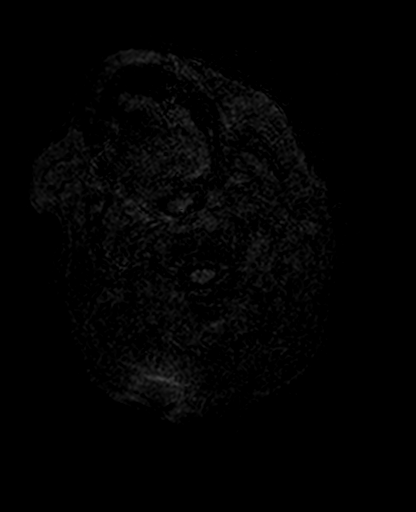
[im 10/85]
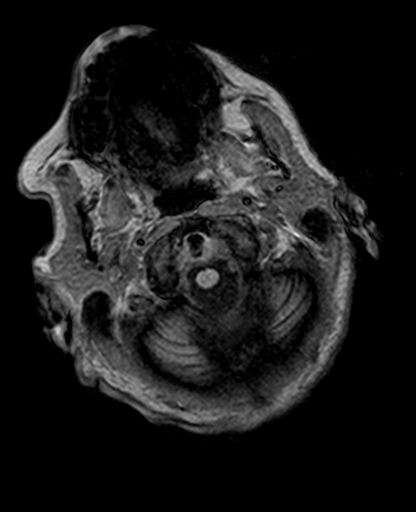
[im 29/85]
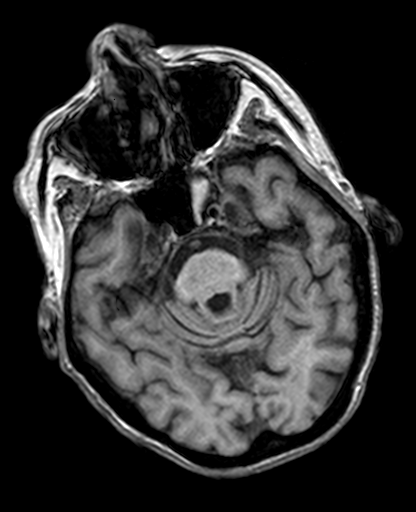
[im 38/85]
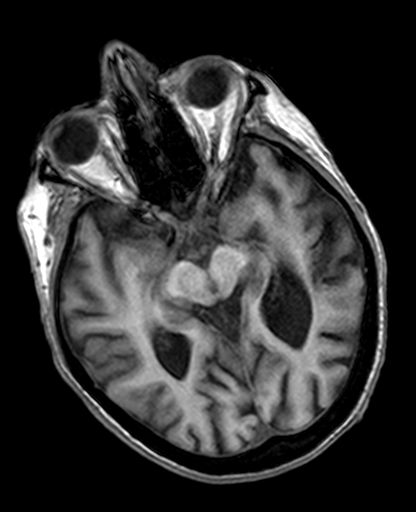
[im 47/85]
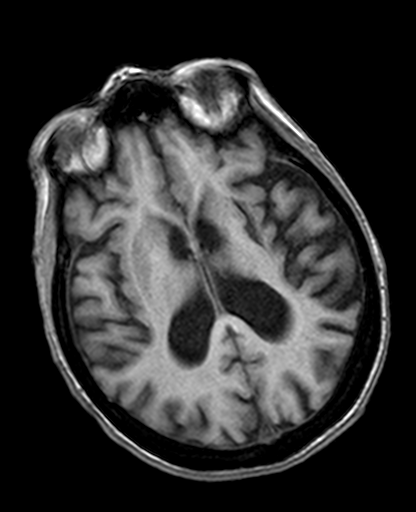
[im 57/85]
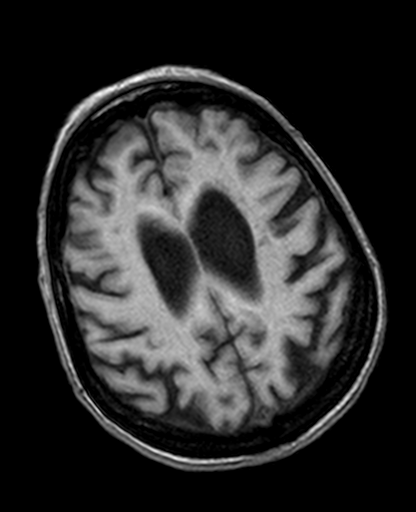
[im 75/85]
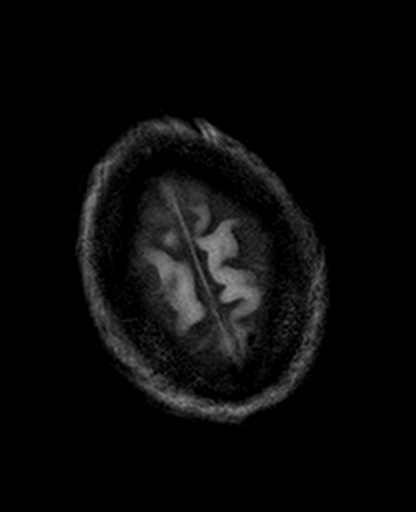

[Series 11: T2 · coronal · 5.0mm · 0.63mm/px · 3 of 28 slices shown (3 of 3)]
[im 1/28]
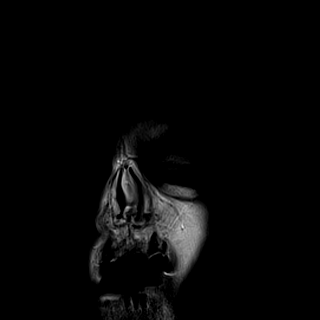
[im 14/28]
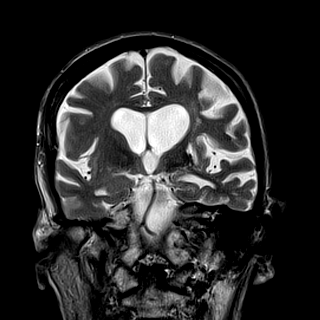
[im 28/28]
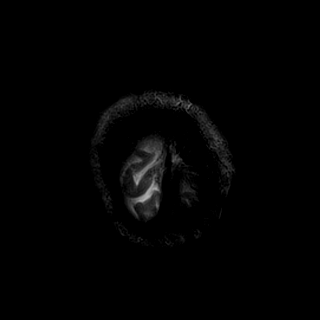

[35 of 48 positions shown; findings below may reference images not displayed]

FINDINGS: Brain: Diffusion imaging does not show any acute or subacute
infarction. No focal insult affects the brainstem or cerebellum.
There is some brainstem and cerebellar atrophy. Cerebral hemispheres
show generalized atrophy with old infarction in the left basal
ganglia and radiating white matter tracts particularly the external
capsule. There are old left temporal cortical and subcortical
infarctions. There is an old left frontoparietal junction cortical
infarction. No mass lesion, hemorrhage, hydrocephalus or extra-axial
collection.

Vascular: Major vessels at the base of the brain show flow.

Skull and upper cervical spine: Negative

Sinuses/Orbits: Clear/normal

Other: None
IMPRESSION: No acute or reversible finding. Generalized brain atrophy. Old left
basal ganglia and radiating white matter infarctions. Old cortical
infarctions of the left temporal lobe. Small old cortical infarction
at the left frontoparietal junction.

## 2020-07-13 DIAGNOSIS — Z7189 Other specified counseling: Secondary | ICD-10-CM
